# Patient Record
Sex: Female | Born: 1975 | Race: Black or African American | Hispanic: No | Marital: Married | Smoking: Former smoker
Health system: Southern US, Community
[De-identification: ages and names within clinical notes are randomized; demographics above are authoritative.]

## PROBLEM LIST (undated history)

## (undated) DIAGNOSIS — J449 Chronic obstructive pulmonary disease, unspecified: Secondary | ICD-10-CM

## (undated) DIAGNOSIS — I509 Heart failure, unspecified: Secondary | ICD-10-CM

## (undated) DIAGNOSIS — M1990 Unspecified osteoarthritis, unspecified site: Secondary | ICD-10-CM

## (undated) DIAGNOSIS — J189 Pneumonia, unspecified organism: Secondary | ICD-10-CM

## (undated) DIAGNOSIS — E119 Type 2 diabetes mellitus without complications: Secondary | ICD-10-CM

## (undated) DIAGNOSIS — I82409 Acute embolism and thrombosis of unspecified deep veins of unspecified lower extremity: Secondary | ICD-10-CM

## (undated) DIAGNOSIS — E785 Hyperlipidemia, unspecified: Secondary | ICD-10-CM

## (undated) DIAGNOSIS — G4730 Sleep apnea, unspecified: Secondary | ICD-10-CM

## (undated) HISTORY — PX: HERNIA REPAIR: SHX51

## (undated) HISTORY — PX: ACHILLES TENDON REPAIR: SUR1153

## (undated) HISTORY — DX: Type 2 diabetes mellitus without complications: E11.9

## (undated) HISTORY — PX: OTHER SURGICAL HISTORY: SHX169

## (undated) HISTORY — PX: JOINT REPLACEMENT: SHX530

---

## 1998-03-27 ENCOUNTER — Other Ambulatory Visit: Admission: RE | Admit: 1998-03-27 | Discharge: 1998-03-27 | Payer: Self-pay | Admitting: Obstetrics

## 1998-07-17 ENCOUNTER — Encounter: Admission: RE | Admit: 1998-07-17 | Discharge: 1998-07-17 | Payer: Self-pay | Admitting: Obstetrics

## 1998-07-31 ENCOUNTER — Encounter: Admission: RE | Admit: 1998-07-31 | Discharge: 1998-07-31 | Payer: Self-pay | Admitting: Obstetrics & Gynecology

## 2001-10-02 ENCOUNTER — Emergency Department (HOSPITAL_COMMUNITY): Admission: EM | Admit: 2001-10-02 | Discharge: 2001-10-02 | Payer: Self-pay

## 2009-04-16 ENCOUNTER — Encounter: Admission: RE | Admit: 2009-04-16 | Discharge: 2009-05-07 | Payer: Self-pay | Admitting: Orthopedic Surgery

## 2009-07-07 ENCOUNTER — Encounter: Admission: RE | Admit: 2009-07-07 | Discharge: 2009-08-12 | Payer: Self-pay | Admitting: Orthopedic Surgery

## 2009-07-14 ENCOUNTER — Encounter: Admission: RE | Admit: 2009-07-14 | Discharge: 2009-08-12 | Payer: Self-pay | Admitting: Orthopedic Surgery

## 2009-08-12 ENCOUNTER — Encounter: Admission: RE | Admit: 2009-08-12 | Discharge: 2009-11-10 | Payer: Self-pay | Admitting: Emergency Medicine

## 2011-03-16 ENCOUNTER — Other Ambulatory Visit (HOSPITAL_BASED_OUTPATIENT_CLINIC_OR_DEPARTMENT_OTHER): Payer: Self-pay | Admitting: Nurse Practitioner

## 2011-03-16 ENCOUNTER — Ambulatory Visit (HOSPITAL_BASED_OUTPATIENT_CLINIC_OR_DEPARTMENT_OTHER): Payer: Self-pay

## 2011-03-16 ENCOUNTER — Other Ambulatory Visit (HOSPITAL_BASED_OUTPATIENT_CLINIC_OR_DEPARTMENT_OTHER): Payer: Self-pay | Admitting: Internal Medicine

## 2011-03-16 ENCOUNTER — Ambulatory Visit (HOSPITAL_BASED_OUTPATIENT_CLINIC_OR_DEPARTMENT_OTHER)
Admission: RE | Admit: 2011-03-16 | Discharge: 2011-03-16 | Disposition: A | Discharge status: Home / Self Care | Payer: BC Managed Care – PPO | Source: Ambulatory Visit | Attending: Nurse Practitioner | Admitting: Nurse Practitioner

## 2011-03-16 DIAGNOSIS — M79606 Pain in leg, unspecified: Secondary | ICD-10-CM

## 2011-03-16 DIAGNOSIS — M79609 Pain in unspecified limb: Secondary | ICD-10-CM | POA: Insufficient documentation

## 2011-03-16 DIAGNOSIS — M79605 Pain in left leg: Secondary | ICD-10-CM

## 2012-02-24 ENCOUNTER — Other Ambulatory Visit (HOSPITAL_COMMUNITY)
Admission: RE | Admit: 2012-02-24 | Discharge: 2012-02-24 | Disposition: A | Discharge status: Home / Self Care | Payer: BC Managed Care – PPO | Source: Ambulatory Visit | Attending: Obstetrics and Gynecology | Admitting: Obstetrics and Gynecology

## 2012-02-24 ENCOUNTER — Other Ambulatory Visit: Payer: Self-pay | Admitting: Obstetrics and Gynecology

## 2012-02-24 DIAGNOSIS — Z124 Encounter for screening for malignant neoplasm of cervix: Secondary | ICD-10-CM | POA: Insufficient documentation

## 2012-09-02 IMAGING — US US EXTREM LOW VENOUS*L*
1 series · 13 of 13 positions shown · non-contrast
Comparison: None

CLINICAL DATA: LEG PAIN;;

LEFT LOWER EXTREMITY VENOUS DUPLEX ULTRASOUND
TECHNIQUE: Gray-scale sonography with compression, as well as color
and duplex ultrasound, were performed to evaluate the deep venous
system from the level of the common femoral vein through the
popliteal and proximal calf veins.

[Series 1: us extrem low venous*left* · 13 of 13 slices shown]
[im 1/13]
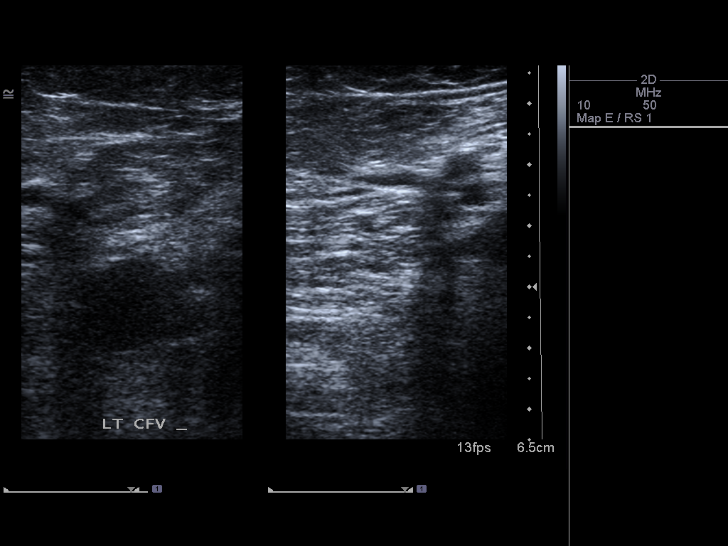
[im 2/13]
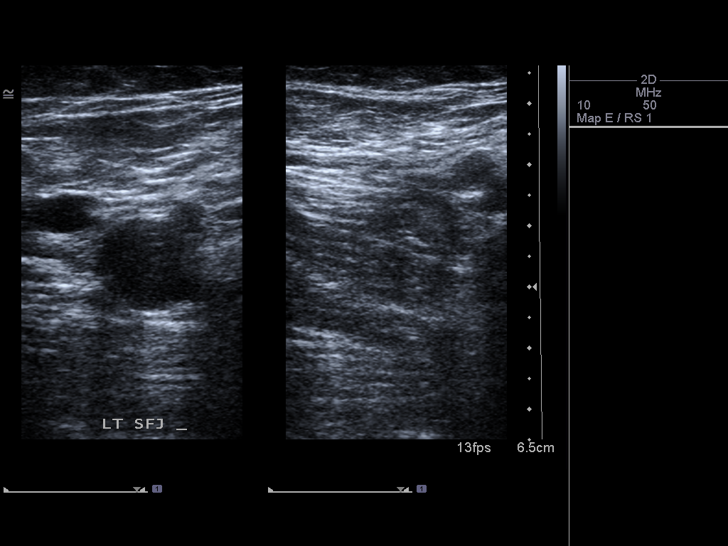
[im 3/13]
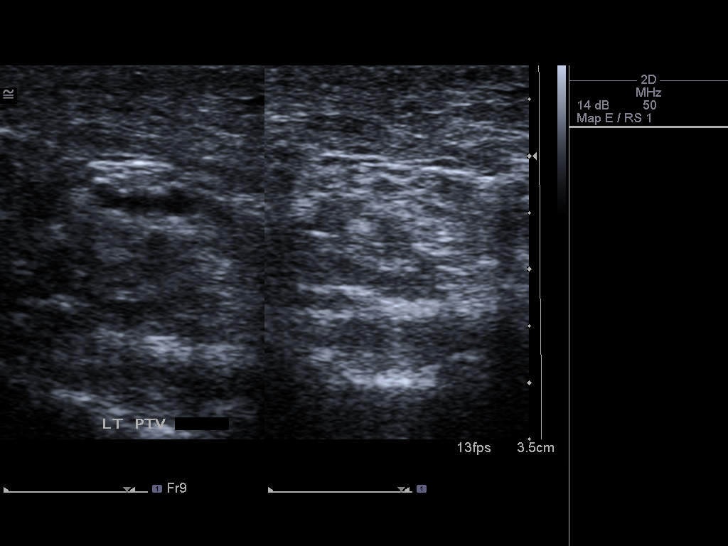
[im 4/13]
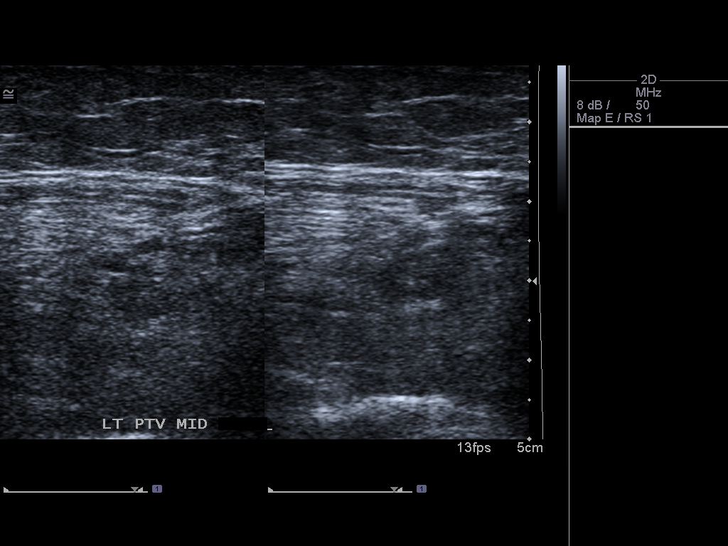
[im 5/13]
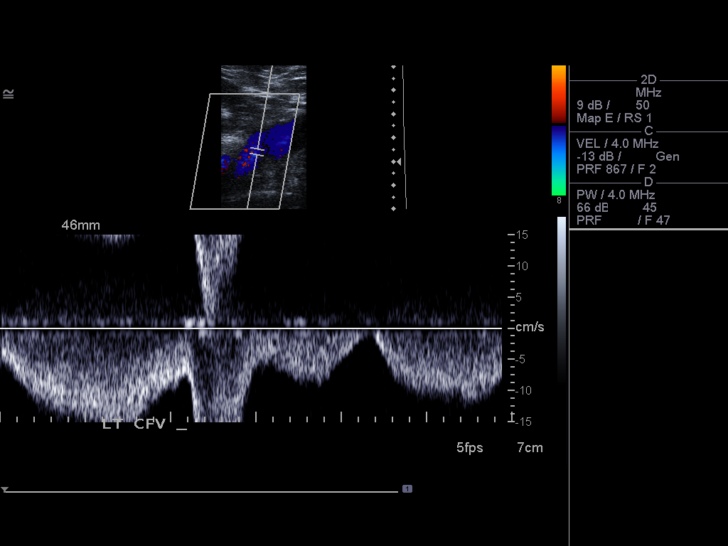
[im 6/13]
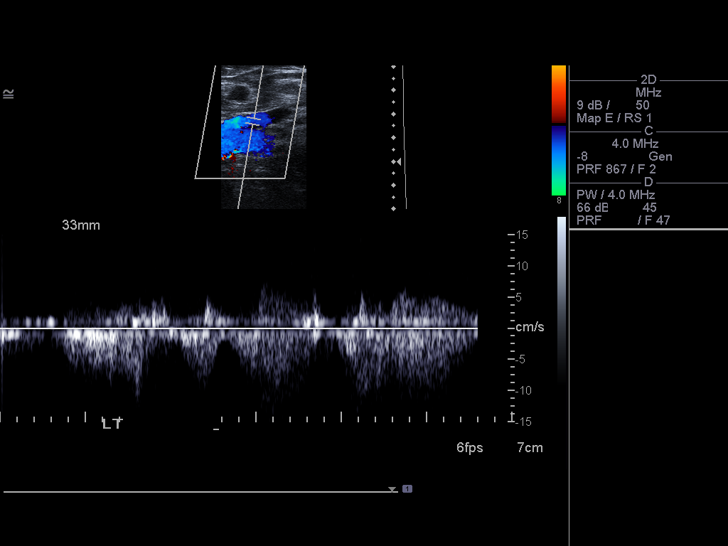
[im 7/13]
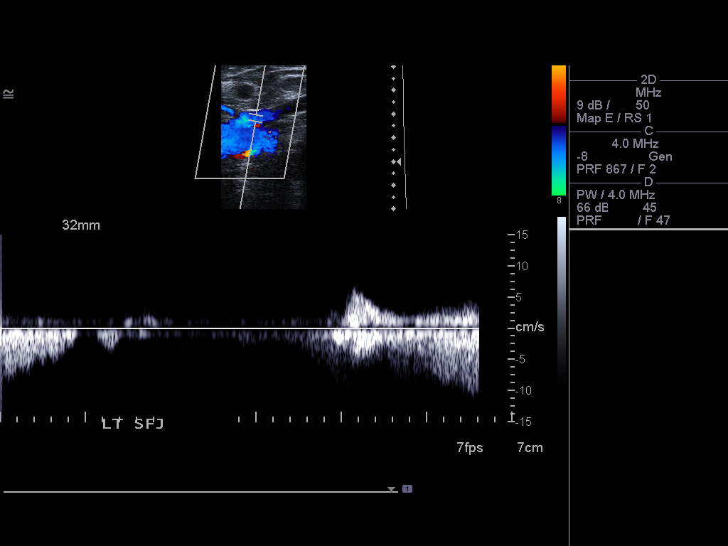
[im 8/13]
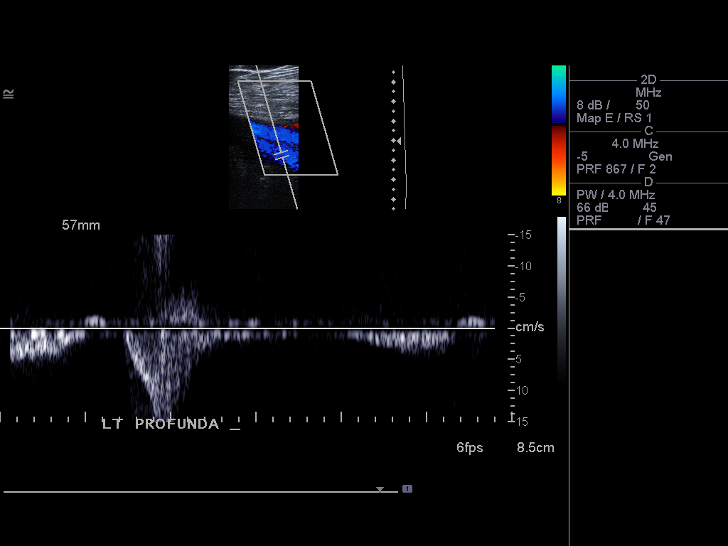
[im 9/13]
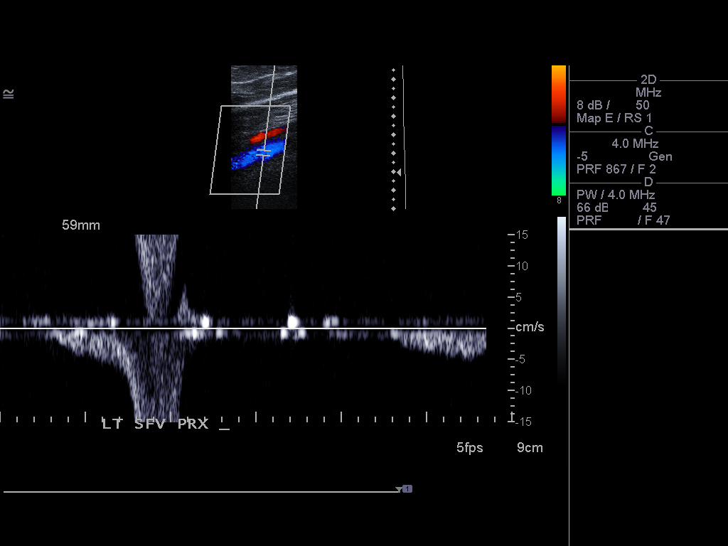
[im 10/13]
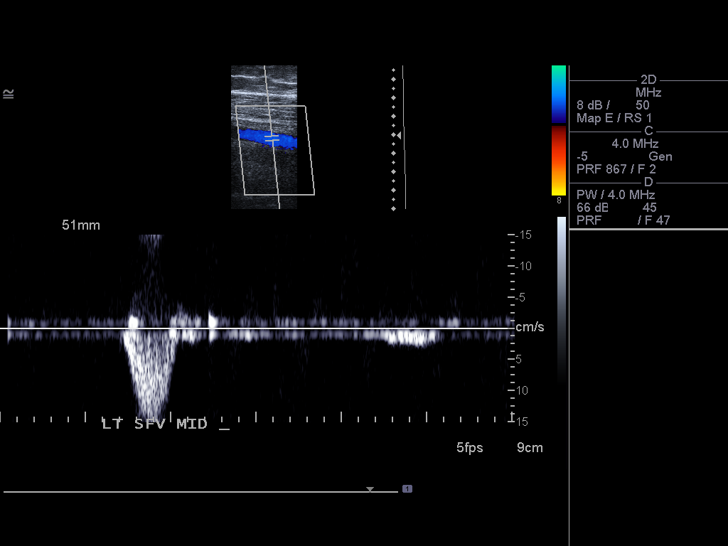
[im 11/13]
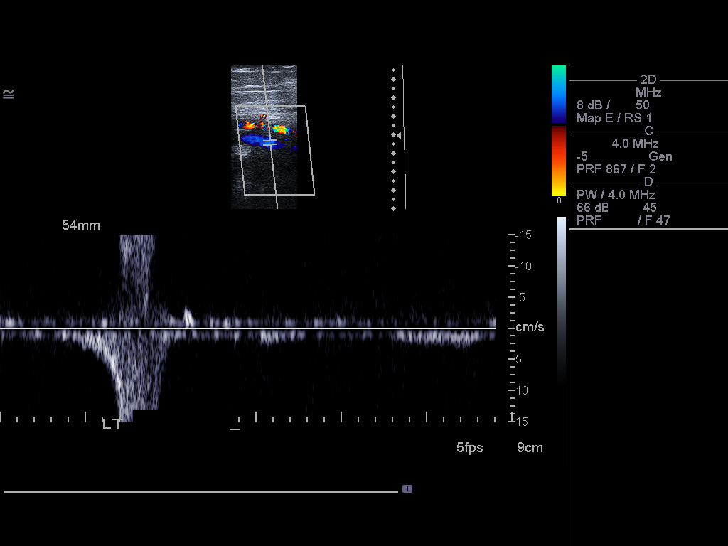
[im 12/13]
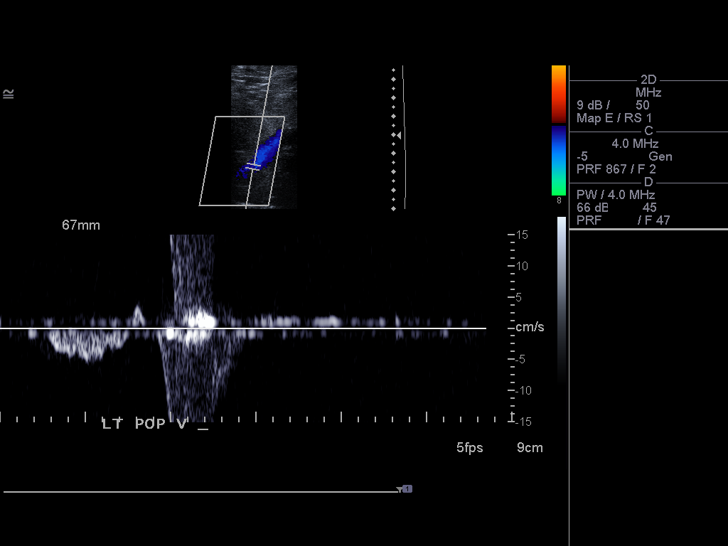
[im 13/13]
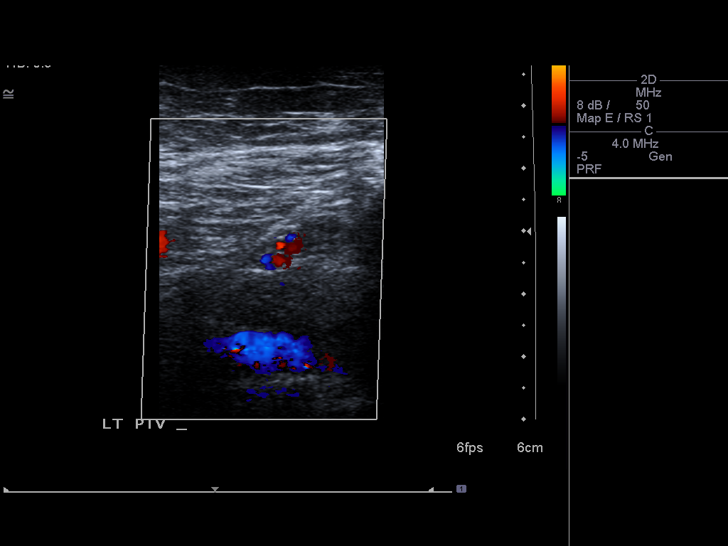

[13 of 13 positions shown; findings below may reference images not displayed]

FINDINGS: Normal compressibility and normal Doppler signal within
the common femoral, superficial femoral and popliteal veins, down
to the proximal calf veins.  No grayscale filling defects to
suggest DVT.
IMPRESSION: No evidence of left lower extremity deep vein thrombosis.

## 2012-11-01 ENCOUNTER — Encounter (HOSPITAL_BASED_OUTPATIENT_CLINIC_OR_DEPARTMENT_OTHER): Payer: Self-pay | Admitting: *Deleted

## 2012-11-01 ENCOUNTER — Emergency Department (HOSPITAL_BASED_OUTPATIENT_CLINIC_OR_DEPARTMENT_OTHER)
Admission: EM | Admit: 2012-11-01 | Discharge: 2012-11-02 | Disposition: A | Discharge status: Home / Self Care | Payer: BC Managed Care – PPO | Attending: Emergency Medicine | Admitting: Emergency Medicine

## 2012-11-01 DIAGNOSIS — F17200 Nicotine dependence, unspecified, uncomplicated: Secondary | ICD-10-CM | POA: Insufficient documentation

## 2012-11-01 DIAGNOSIS — I10 Essential (primary) hypertension: Secondary | ICD-10-CM | POA: Insufficient documentation

## 2012-11-01 DIAGNOSIS — Z79899 Other long term (current) drug therapy: Secondary | ICD-10-CM | POA: Insufficient documentation

## 2012-11-01 DIAGNOSIS — G43909 Migraine, unspecified, not intractable, without status migrainosus: Secondary | ICD-10-CM | POA: Insufficient documentation

## 2012-11-01 MED ORDER — DIPHENHYDRAMINE HCL 50 MG/ML IJ SOLN
12.5000 mg | Freq: Once | INTRAMUSCULAR | Status: AC
  Administered 2012-11-01: 12.5 mg via INTRAVENOUS
  Filled 2012-11-01: qty 1

## 2012-11-01 MED ORDER — KETOROLAC TROMETHAMINE 30 MG/ML IJ SOLN
30.0000 mg | Freq: Once | INTRAMUSCULAR | Status: AC
  Administered 2012-11-01: 30 mg via INTRAVENOUS
  Filled 2012-11-01: qty 1

## 2012-11-01 MED ORDER — PROCHLORPERAZINE EDISYLATE 5 MG/ML IJ SOLN
10.0000 mg | Freq: Four times a day (QID) | INTRAMUSCULAR | Status: DC | PRN
  Filled 2012-11-01: qty 2

## 2012-11-01 MED ORDER — HALOPERIDOL LACTATE 5 MG/ML IJ SOLN
INTRAMUSCULAR | Status: AC
  Administered 2012-11-01: 5 mg via INTRAVENOUS
  Filled 2012-11-01: qty 1

## 2012-11-01 MED ORDER — SODIUM CHLORIDE 0.9 % IV BOLUS (SEPSIS)
1000.0000 mL | Freq: Once | INTRAVENOUS | Status: AC
  Administered 2012-11-01: 1000 mL via INTRAVENOUS

## 2012-11-01 MED ORDER — HALOPERIDOL LACTATE 5 MG/ML IJ SOLN: 5.0000 mg | Freq: Once | INTRAMUSCULAR | Status: AC

## 2012-11-01 NOTE — ED Notes (Signed)
Pt c/o migraine HA x4 days not relieved by Verapamil, ibuprofen and a migraine med that starts with an "R".

## 2012-11-01 NOTE — ED Provider Notes (Signed)
History     CSN: [id number]  Arrival date & time 11/01/12  2158   First MD Initiated Contact with Patient 11/01/12 2300      Chief Complaint  Patient presents with  . Migraine    (Consider location/radiation/quality/duration/timing/severity/associated sxs/prior treatment) HPI Anna Rowe is a 37 y.o. female presents with four-day history of migraine headache. She says the headache came on gradually, feels like her prior migraine headaches is been associated with nausea, photophobia and phonophobia has not resolved with her typical therapies. She denies rapid onset of headache, or onset during exertion.  Pain is now 10 out of 10, throbbing, all over the head, nonradiating, not associated with numbness, tingling, chest pain, first breath, changes in vision, double vision, aura, stiff neck, fevers, chills, abdominal pain, diarrhea, myalgias, arthralgias or rash.   Past Medical History  Diagnosis Date  . Migraines   . Hypertension     Past Surgical History  Procedure Laterality Date  . Plantar    . Hernia repair    . Achilles tendon repair      No family history on file.  History  Substance Use Topics  . Smoking status: Current Every Day Smoker  . Smokeless tobacco: Not on file  . Alcohol Use: Yes    OB History   Grav Para Term Preterm Abortions TAB SAB Ect Mult Living                  Review of Systems At least 10pt or greater review of systems completed and are negative except where specified in the HPI.  Allergies  Review of patient's allergies indicates no known allergies.  Home Medications   Current Outpatient Rx  Name  Route  Sig  Dispense  Refill  . buPROPion (WELLBUTRIN) 100 MG tablet   Oral   Take 100 mg by mouth 2 (two) times daily.         . clonazePAM (KLONOPIN) 1 MG tablet   Oral   Take 1 mg by mouth 2 (two) times daily as needed for anxiety.         Marland Kitchen escitalopram (LEXAPRO) 20 MG tablet   Oral   Take 20 mg by mouth daily.        . verapamil (CALAN) 40 MG tablet   Oral   Take 40 mg by mouth as needed.           BP 158/99  Pulse 72  Temp(Src) 98.5 F (36.9 C) (Oral)  Resp 18  Ht 5\' 11"  (1.803 m)  Wt 275 lb (124.739 kg)  BMI 38.37 kg/m2  SpO2 100%  LMP 10/30/2012  Physical Exam  Nursing notes reviewed.  Electronic medical record reviewed. VITAL SIGNS:   Filed Vitals:   11/01/12 2212 11/02/12 0019  BP: 158/99 135/82  Pulse: 72 69  Temp: 98.5 F (36.9 C)   TempSrc: Oral   Resp: 18 16  Height: 5\' 11"  (1.803 m)   Weight: 275 lb (124.739 kg)   SpO2: 100% 96%   CONSTITUTIONAL: Awake, orientedx4, appears non-toxic HENT: Atraumatic, normocephalic, oral mucosa pink and moist, airway patent. Nares patent without drainage. External ears normal. EYES: Conjunctiva clear, EOMI, PERRLA NECK: Trachea midline, non-tender, supple CARDIOVASCULAR: Normal heart rate, Normal rhythm, No murmurs, rubs, gallops PULMONARY/CHEST: Clear to auscultation, no rhonchi, wheezes, or rales. Symmetrical breath sounds. Non-tender. ABDOMINAL: Non-distended, soft, non-tender - no rebound or guarding.  BS normal. NEUROLOGIC:Facial sensation equal to light touch bilaterally.  Good muscle bulk in the masseter muscle  and good lateral movement of the jaw.  Facial expressions equal and good strength with smile/frown and puffed cheeks.  Hearing grossly intact to finger rub test.  Uvula, tongue are midline with no deviation. Symmetrical palate elevation.  Trapezius and SCM muscles are 5/5 strength bilaterally.   Non-focal, moving all four extremities, no gross sensory or motor deficits. EXTREMITIES: No clubbing, cyanosis, or edema SKIN: Warm, Dry, No erythema, No rash  ED Course  Procedures (including critical care time)  Labs Reviewed - No data to display No results found.   1. Migraine     Medications  ketorolac (TORADOL) 30 MG/ML injection 30 mg (30 mg Intravenous Given 11/01/12 2328)  sodium chloride 0.9 % bolus 1,000 mL (0  mLs Intravenous Stopped 11/02/12 0016)  diphenhydrAMINE (BENADRYL) injection 12.5 mg (12.5 mg Intravenous Given 11/01/12 2330)  haloperidol lactate (HALDOL) injection 5 mg (5 mg Intravenous Given 11/01/12 2331)     MDM   Anna Rowe is a 37 y.o. female presenting with migraine headache x4 days. This seems a typical migraine headache per patient except it's been lasting for 4 days and has been persistent despite her therapies.  Give the patient a migraine cocktail and reevaluate. Do not think imaging is indicated at this time, considered subarachnoid hemorrhage think this is unlikely given the context and history and physical exam. Likewise patient is afebrile, nontoxic and in think she's got a CNS infection, meningitis is very low on the differential.    Patient reassessed at her migraine cocktail, patient's pain is 2/10 she feels like she can go to sleep at home.  I explained the diagnosis and have given explicit precautions to return to the ER including any other new or worsening symptoms. The patient understands and accepts the medical plan as it's been dictated and I have answered their questions. Discharge instructions concerning home care and prescriptions have been given.  The patient is STABLE and is discharged to home in good condition.         Jones Skene, MD 11/02/12 [phone]

## 2012-11-01 NOTE — ED Notes (Signed)
MD at bedside. 

## 2012-11-02 MED ORDER — DIPHENHYDRAMINE HCL 25 MG PO CAPS: 25.0000 mg | ORAL_CAPSULE | Freq: Four times a day (QID) | ORAL | Status: DC | PRN

## 2012-11-02 MED ORDER — PROCHLORPERAZINE MALEATE 10 MG PO TABS: 10.0000 mg | ORAL_TABLET | Freq: Two times a day (BID) | ORAL | Status: DC | PRN

## 2012-12-13 ENCOUNTER — Encounter (HOSPITAL_BASED_OUTPATIENT_CLINIC_OR_DEPARTMENT_OTHER): Payer: Self-pay | Admitting: Emergency Medicine

## 2012-12-13 ENCOUNTER — Emergency Department (HOSPITAL_BASED_OUTPATIENT_CLINIC_OR_DEPARTMENT_OTHER)
Admission: EM | Admit: 2012-12-13 | Discharge: 2012-12-13 | Disposition: A | Discharge status: Home / Self Care | Payer: BC Managed Care – PPO | Attending: Emergency Medicine | Admitting: Emergency Medicine

## 2012-12-13 DIAGNOSIS — G43909 Migraine, unspecified, not intractable, without status migrainosus: Secondary | ICD-10-CM | POA: Insufficient documentation

## 2012-12-13 DIAGNOSIS — Z79899 Other long term (current) drug therapy: Secondary | ICD-10-CM | POA: Insufficient documentation

## 2012-12-13 DIAGNOSIS — B9689 Other specified bacterial agents as the cause of diseases classified elsewhere: Secondary | ICD-10-CM

## 2012-12-13 DIAGNOSIS — Z3202 Encounter for pregnancy test, result negative: Secondary | ICD-10-CM | POA: Insufficient documentation

## 2012-12-13 DIAGNOSIS — N760 Acute vaginitis: Secondary | ICD-10-CM | POA: Insufficient documentation

## 2012-12-13 DIAGNOSIS — F17200 Nicotine dependence, unspecified, uncomplicated: Secondary | ICD-10-CM | POA: Insufficient documentation

## 2012-12-13 DIAGNOSIS — N72 Inflammatory disease of cervix uteri: Secondary | ICD-10-CM

## 2012-12-13 LAB — URINALYSIS, ROUTINE W REFLEX MICROSCOPIC
Bilirubin Urine: NEGATIVE
Glucose, UA: NEGATIVE mg/dL
Ketones, ur: NEGATIVE mg/dL
Nitrite: NEGATIVE
Protein, ur: NEGATIVE mg/dL
Specific Gravity, Urine: 1.027 (ref 1.005–1.030)
Urobilinogen, UA: 1 mg/dL (ref 0.0–1.0)
pH: 6 (ref 5.0–8.0)

## 2012-12-13 LAB — WET PREP, GENITAL
Trich, Wet Prep: NONE SEEN
Yeast Wet Prep HPF POC: NONE SEEN

## 2012-12-13 LAB — URINE MICROSCOPIC-ADD ON

## 2012-12-13 LAB — PREGNANCY, URINE: Preg Test, Ur: NEGATIVE

## 2012-12-13 MED ORDER — METRONIDAZOLE 500 MG PO TABS: 500.0000 mg | ORAL_TABLET | Freq: Two times a day (BID) | ORAL | Status: DC

## 2012-12-13 MED ORDER — AZITHROMYCIN 250 MG PO TABS
1000.0000 mg | ORAL_TABLET | Freq: Once | ORAL | Status: AC
  Administered 2012-12-13: 1000 mg via ORAL
  Filled 2012-12-13: qty 4

## 2012-12-13 MED ORDER — CEFTRIAXONE SODIUM 250 MG IJ SOLR
250.0000 mg | Freq: Once | INTRAMUSCULAR | Status: AC
  Administered 2012-12-13: 250 mg via INTRAMUSCULAR
  Filled 2012-12-13: qty 250

## 2012-12-13 NOTE — ED Notes (Signed)
Pt c/o lower abd pain x 3 weeks. Denies n/v/d.

## 2012-12-13 NOTE — ED Provider Notes (Signed)
History     CSN: [id number]  Arrival date & time 12/13/12  0226   First MD Initiated Contact with Patient 12/13/12 0240      Chief Complaint  Patient presents with  . Abdominal Pain    (Consider location/radiation/quality/duration/timing/severity/associated sxs/prior treatment) HPI This is a 37 year old female with a three-week history of abdominal discomfort. The discomfort is located in the suprapubic region. The discomfort is vaguely described but the closest she can come as a pressure. The discomfort is mild to moderate. It gets worse before she urinates and improves after she urinates. She denies frank dysuria. She has not seen blood in her urine. She does not get the sense that she is urinating more frequently than usual. She denies vaginal bleeding or discharge. She denies fever, chills, chest pain, shortness of breath, nausea, vomiting or diarrhea. The discomfort is not worse with movement or palpation the  Past Medical History  Diagnosis Date  . Migraines     Past Surgical History  Procedure Laterality Date  . Plantar    . Hernia repair    . Achilles tendon repair      No family history on file.  History  Substance Use Topics  . Smoking status: Current Every Day Smoker  . Smokeless tobacco: Not on file  . Alcohol Use: Yes    OB History   Grav Para Term Preterm Abortions TAB SAB Ect Mult Living                  Review of Systems  All other systems reviewed and are negative.    Allergies  Tramadol  Home Medications   Current Outpatient Rx  Name  Route  Sig  Dispense  Refill  . diphenhydrAMINE (BENADRYL) 25 mg capsule   Oral   Take 1 capsule (25 mg total) by mouth every 6 (six) hours as needed for itching (take with comapzine for headache).   10 capsule   0   . HYDROcodone-acetaminophen (VICODIN) 5-500 MG per tablet   Oral   Take 1 tablet by mouth every 6 (six) hours as needed for pain.         Marland Kitchen prochlorperazine (COMPAZINE) 10 MG tablet  Oral   Take 1 tablet (10 mg total) by mouth 2 (two) times daily as needed (for headache, nausea; take with 25 mg benadryl).   10 tablet   0   . buPROPion (WELLBUTRIN) 100 MG tablet   Oral   Take 100 mg by mouth 2 (two) times daily.         . clonazePAM (KLONOPIN) 1 MG tablet   Oral   Take 1 mg by mouth 2 (two) times daily as needed for anxiety.         Marland Kitchen escitalopram (LEXAPRO) 20 MG tablet   Oral   Take 20 mg by mouth daily.         . verapamil (CALAN) 40 MG tablet   Oral   Take 40 mg by mouth as needed.           BP 147/99  Pulse 78  Temp(Src) 98.2 F (36.8 C) (Oral)  Resp 18  Ht 6' (1.829 m)  Wt 260 lb (117.935 kg)  BMI 35.25 kg/m2  SpO2 99%  LMP 11/26/2012  Physical Exam General: Well-developed, well-nourished female in no acute distress; appearance consistent with age of record HENT: normocephalic, atraumatic Eyes: pupils equal round and reactive to light; extraocular muscles intact Neck: supple Heart: regular rate and rhythm Lungs:  clear to auscultation bilaterally Abdomen: soft; nondistended; nontender; no masses or hepatosplenomegaly; bowel sounds present GU: No CVA tenderness; normal external genitalia; yellowish vaginal discharge; no vaginal bleeding; no cervical motion tenderness; slight adnexal tenderness left greater than right Extremities: No deformity; full range of motion Neurologic: Awake, alert and oriented; motor function intact in all extremities and symmetric; no facial droop Skin: Warm and dry Psychiatric: Normal mood and affect    ED Course  Procedures (including critical care time)     MDM   Nursing notes and vitals signs, including pulse oximetry, reviewed.  Summary of this visit's results, reviewed by myself:  Labs:  Results for orders placed during the hospital encounter of 12/13/12 (from the past 24 hour(s))  PREGNANCY, URINE     Status: None   Collection Time    12/13/12  2:30 AM      Result Value Range   Preg  Test, Ur NEGATIVE  NEGATIVE  URINALYSIS, ROUTINE W REFLEX MICROSCOPIC     Status: Abnormal   Collection Time    12/13/12  2:30 AM      Result Value Range   Color, Urine YELLOW  YELLOW   APPearance CLOUDY (*) CLEAR   Specific Gravity, Urine 1.027  1.005 - 1.030   pH 6.0  5.0 - 8.0   Glucose, UA NEGATIVE  NEGATIVE mg/dL   Hgb urine dipstick TRACE (*) NEGATIVE   Bilirubin Urine NEGATIVE  NEGATIVE   Ketones, ur NEGATIVE  NEGATIVE mg/dL   Protein, ur NEGATIVE  NEGATIVE mg/dL   Urobilinogen, UA 1.0  0.0 - 1.0 mg/dL   Nitrite NEGATIVE  NEGATIVE   Leukocytes, UA SMALL (*) NEGATIVE  URINE MICROSCOPIC-ADD ON     Status: Abnormal   Collection Time    12/13/12  2:30 AM      Result Value Range   Squamous Epithelial / LPF MANY (*) RARE   WBC, UA 3-6  <3 WBC/hpf   RBC / HPF 0-2  <3 RBC/hpf   Bacteria, UA MANY (*) RARE  WET PREP, GENITAL     Status: Abnormal   Collection Time    12/13/12  2:57 AM      Result Value Range   Yeast Wet Prep HPF POC NONE SEEN  NONE SEEN   Trich, Wet Prep NONE SEEN  NONE SEEN   Clue Cells Wet Prep HPF POC MODERATE (*) NONE SEEN   WBC, Wet Prep HPF POC MANY (*) NONE SEEN    Will treat for cervicitis and bacterial vaginosis.      Hanley Seamen, MD 12/13/12 [phone]

## 2012-12-13 NOTE — ED Notes (Signed)
Pt states she began having pressure sensation with urination yesterday.

## 2012-12-14 LAB — URINE CULTURE

## 2012-12-14 LAB — GC/CHLAMYDIA PROBE AMP
CT Probe RNA: NEGATIVE
GC Probe RNA: NEGATIVE

## 2013-08-15 HISTORY — PX: KNEE ARTHROSCOPY: SUR90

## 2015-04-10 ENCOUNTER — Encounter (HOSPITAL_BASED_OUTPATIENT_CLINIC_OR_DEPARTMENT_OTHER): Payer: Self-pay | Admitting: *Deleted

## 2015-04-10 ENCOUNTER — Emergency Department (HOSPITAL_BASED_OUTPATIENT_CLINIC_OR_DEPARTMENT_OTHER)
Admission: EM | Admit: 2015-04-10 | Discharge: 2015-04-10 | Discharge status: Against Medical Advice | Payer: 59 | Attending: Emergency Medicine | Admitting: Emergency Medicine

## 2015-04-10 DIAGNOSIS — N898 Other specified noninflammatory disorders of vagina: Secondary | ICD-10-CM | POA: Insufficient documentation

## 2015-04-10 DIAGNOSIS — Z720 Tobacco use: Secondary | ICD-10-CM | POA: Diagnosis not present

## 2015-04-10 NOTE — ED Notes (Signed)
Vaginal discharge x 2 weeks. She was seen at [facility] and treated for a UTI with Cipro. Now her discharge has changed from white to yellow.

## 2015-04-10 NOTE — ED Notes (Addendum)
Called in w/a x 3 with no answer. She told registration she was going outside to wait. She is not found outside.

## 2016-02-26 ENCOUNTER — Other Ambulatory Visit: Payer: Self-pay | Admitting: Orthopedic Surgery

## 2016-03-24 ENCOUNTER — Encounter (HOSPITAL_COMMUNITY)
Admission: RE | Admit: 2016-03-24 | Discharge: 2016-03-24 | Disposition: A | Discharge status: Home / Self Care | Payer: Worker's Compensation | Source: Ambulatory Visit | Attending: Orthopedic Surgery | Admitting: Orthopedic Surgery

## 2016-03-24 ENCOUNTER — Ambulatory Visit (HOSPITAL_COMMUNITY)
Admission: RE | Admit: 2016-03-24 | Discharge: 2016-03-24 | Disposition: A | Discharge status: Home / Self Care | Payer: Self-pay | Source: Ambulatory Visit | Attending: Orthopedic Surgery | Admitting: Orthopedic Surgery

## 2016-03-24 ENCOUNTER — Encounter (HOSPITAL_COMMUNITY): Payer: Self-pay

## 2016-03-24 DIAGNOSIS — Z01812 Encounter for preprocedural laboratory examination: Secondary | ICD-10-CM | POA: Diagnosis not present

## 2016-03-24 DIAGNOSIS — Z01818 Encounter for other preprocedural examination: Secondary | ICD-10-CM | POA: Insufficient documentation

## 2016-03-24 DIAGNOSIS — M1711 Unilateral primary osteoarthritis, right knee: Secondary | ICD-10-CM | POA: Insufficient documentation

## 2016-03-24 DIAGNOSIS — Z0183 Encounter for blood typing: Secondary | ICD-10-CM | POA: Diagnosis not present

## 2016-03-24 LAB — APTT: aPTT: 33 seconds (ref 24–36)

## 2016-03-24 LAB — CBC WITH DIFFERENTIAL/PLATELET
Basophils Absolute: 0.1 10*3/uL (ref 0.0–0.1)
Basophils Relative: 1 %
Eosinophils Absolute: 0.2 10*3/uL (ref 0.0–0.7)
Eosinophils Relative: 2 %
HCT: 42.4 % (ref 36.0–46.0)
Hemoglobin: 13.9 g/dL (ref 12.0–15.0)
Lymphocytes Relative: 51 %
Lymphs Abs: 5.3 10*3/uL — ABNORMAL HIGH (ref 0.7–4.0)
MCH: 23 pg — ABNORMAL LOW (ref 26.0–34.0)
MCHC: 32.8 g/dL (ref 30.0–36.0)
MCV: 70.1 fL — ABNORMAL LOW (ref 78.0–100.0)
Monocytes Absolute: 0.6 10*3/uL (ref 0.1–1.0)
Monocytes Relative: 5 %
Neutro Abs: 4.3 10*3/uL (ref 1.7–7.7)
Neutrophils Relative %: 41 %
Platelets: 432 10*3/uL — ABNORMAL HIGH (ref 150–400)
RBC: 6.05 MIL/uL — ABNORMAL HIGH (ref 3.87–5.11)
RDW: 15.7 % — ABNORMAL HIGH (ref 11.5–15.5)
WBC: 10.4 10*3/uL (ref 4.0–10.5)

## 2016-03-24 LAB — COMPREHENSIVE METABOLIC PANEL
ALT: 12 U/L — ABNORMAL LOW (ref 14–54)
AST: 16 U/L (ref 15–41)
Albumin: 3.6 g/dL (ref 3.5–5.0)
Alkaline Phosphatase: 60 U/L (ref 38–126)
Anion gap: 9 (ref 5–15)
BUN: <5 mg/dL — ABNORMAL LOW (ref 6–20)
CO2: 21 mmol/L — ABNORMAL LOW (ref 22–32)
Calcium: 9.2 mg/dL (ref 8.9–10.3)
Chloride: 106 mmol/L (ref 101–111)
Creatinine, Ser: 0.89 mg/dL (ref 0.44–1.00)
GFR calc Af Amer: >60 mL/min (ref >60)
GFR calc non Af Amer: >60 mL/min (ref >60)
Glucose, Bld: 112 mg/dL — ABNORMAL HIGH (ref 65–99)
Potassium: 3 mmol/L — ABNORMAL LOW (ref 3.5–5.1)
Sodium: 136 mmol/L (ref 135–145)
Total Bilirubin: 0.3 mg/dL (ref 0.3–1.2)
Total Protein: 7.1 g/dL (ref 6.5–8.1)

## 2016-03-24 LAB — URINALYSIS, ROUTINE W REFLEX MICROSCOPIC
Bilirubin Urine: NEGATIVE
Glucose, UA: NEGATIVE mg/dL
Ketones, ur: NEGATIVE mg/dL
Nitrite: NEGATIVE
Protein, ur: NEGATIVE mg/dL
Specific Gravity, Urine: 1.013 (ref 1.005–1.030)
pH: 6.5 (ref 5.0–8.0)

## 2016-03-24 LAB — PROTIME-INR
INR: 0.99
Prothrombin Time: 13 seconds (ref 11.4–15.2)

## 2016-03-24 LAB — ABO/RH: ABO/RH(D): O POS

## 2016-03-24 LAB — URINE MICROSCOPIC-ADD ON

## 2016-03-24 LAB — TYPE AND SCREEN
ABO/RH(D): O POS
Antibody Screen: NEGATIVE

## 2016-03-24 LAB — SURGICAL PCR SCREEN
MRSA, PCR: NEGATIVE
Staphylococcus aureus: NEGATIVE

## 2016-03-24 LAB — HCG, SERUM, QUALITATIVE: Preg, Serum: NEGATIVE

## 2016-03-24 NOTE — Progress Notes (Signed)
PCP: [facility] in [facility] on [address] Aletha Halim request ekg/notes.  No cardiologist, denies heart problems

## 2016-03-24 NOTE — Pre-Procedure Instructions (Signed)
    Katelynd Quick-Hope  03/24/2016      [id number] [place], [place] [id number]: [phone]: [phone]   Your procedure is scheduled on Aug. 21  [facility]  Call this number if you have problems the morning of surgery:  [phone]  Remember:  Do not eat food or drink liquids after midnight on aug. 20  Take these medicines the morning of surgery with A SIP OF WATER : clonazepam (klonopin), flexeril if needed,lexapro (escitalopram), oxycontin              Stop NSAIDs: advil, motrin, ibuprofen, aleve, BC Powders, Goody's 1 week prior to surgery.   Do not wear jewelry, make-up or nail polish.  Do not wear lotions, powders, or perfumes.  You may  Not wear deoderant.  Do not shave 48 hours prior to surgery.  Men may shave face and neck.  Do not bring valuables to the hospital.  [facility] or [facility]ables.  Contacts, dentures or bridgework may not be worn into surgery.  Leave your suitcase in the car.  After surgery it may be brought to your room.  For patients admitted to the hospital, discharge time will be determined by your treatment team.  Patients discharged the day of surgery will not be allowed to drive home.    Special instructions: review preparing for surgery  Please read over the following fact sheets that you were given. Coughing and Deep Breathing and MRSA Information

## 2016-04-03 MED ORDER — CEFAZOLIN SODIUM-DEXTROSE 2-4 GM/100ML-% IV SOLN
2.0000 g | INTRAVENOUS | Status: AC
  Administered 2016-04-04: 2 g via INTRAVENOUS
  Filled 2016-04-03: qty 100

## 2016-04-03 NOTE — H&P (Signed)
TOTAL KNEE ADMISSION H&P  Patient is being admitted for right total knee arthroplasty.  Subjective:  Chief Complaint:right knee pain.  HPI: Anna Rowe, 40 y.o. female, has a history of pain and functional disability in the right knee due to trauma and arthritis and has failed non-surgical conservative treatments for greater than 12 weeks to includeNSAID's and/or analgesics, corticosteriod injections, viscosupplementation injections and activity modification.  Onset of symptoms was gradual, starting 2 years ago with gradually worsening course since that time. The patient noted prior procedures on the knee to include  arthroscopy and menisectomy on the right knee(s).  Patient currently rates pain in the right knee(s) at 9 out of 10 with activity. Patient has night pain, worsening of pain with activity and weight bearing, pain that interferes with activities of daily living and joint swelling.  Patient has evidence of subchondral sclerosis, joint subluxation and joint space narrowing by imaging studies. This patient has had ffailure of all reasonable conservative care. There is no active infection.  There are no active problems to display for this patient.  Past Medical History:  Diagnosis Date  . Migraines     Past Surgical History:  Procedure Laterality Date  . ACHILLES TENDON REPAIR    . HERNIA REPAIR     x2  . KNEE ARTHROSCOPY Right 2015  . plantar      No prescriptions prior to admission.   Allergies  Allergen Reactions  . Tramadol Anaphylaxis  . Bee Venom Hives    Social History  Substance Use Topics  . Smoking status: Current Every Day Smoker    Packs/day: 0.50    Years: 17.00    Types: Cigarettes  . Smokeless tobacco: Never Used  . Alcohol use No    No family history on file.   Review of Systems  Genitourinary: Positive for flank pain.  ROS: I have reviewed the patient's review of systems thoroughly and there are no positive responses as relates to the  HPI.  Objective:  Physical Exam  Vital signs in last 24 hours:   Well-developed well-nourished patient in no acute distress. Alert and oriented x3 HEENT:within normal limits Cardiac: Regular rate and rhythm Pulmonary: Lungs clear to auscultation Abdomen: Soft and nontender.  Normal active bowel sounds  Musculoskeletal: right knee: Limited range of motion.  Pain on range of motion.  Trace effusion.  No instability.) Labs: Recent Results (from the past 2160 hour(s))  APTT     Status: None   Collection Time: 03/24/16  2:09 PM  Result Value Ref Range   aPTT 33 24 - 36 seconds  CBC WITH DIFFERENTIAL     Status: Abnormal   Collection Time: 03/24/16  2:09 PM  Result Value Ref Range   WBC 10.4 4.0 - 10.5 K/uL   RBC 6.05 (H) 3.87 - 5.11 MIL/uL   Hemoglobin 13.9 12.0 - 15.0 g/dL   HCT 42.4 36.0 - 46.0 %   MCV 70.1 (L) 78.0 - 100.0 fL   MCH 23.0 (L) 26.0 - 34.0 pg   MCHC 32.8 30.0 - 36.0 g/dL   RDW 15.7 (H) 11.5 - 15.5 %   Platelets 432 (H) 150 - 400 K/uL   Neutrophils Relative % 41 %   Neutro Abs 4.3 1.7 - 7.7 K/uL   Lymphocytes Relative 51 %   Lymphs Abs 5.3 (H) 0.7 - 4.0 K/uL   Monocytes Relative 5 %   Monocytes Absolute 0.6 0.1 - 1.0 K/uL   Eosinophils Relative 2 %   Eosinophils Absolute  0.2 0.0 - 0.7 K/uL   Basophils Relative 1 %   Basophils Absolute 0.1 0.0 - 0.1 K/uL  Comprehensive metabolic panel     Status: Abnormal   Collection Time: 03/24/16  2:09 PM  Result Value Ref Range   Sodium 136 135 - 145 mmol/L   Potassium 3.0 (L) 3.5 - 5.1 mmol/L   Chloride 106 101 - 111 mmol/L   CO2 21 (L) 22 - 32 mmol/L   Glucose, Bld 112 (H) 65 - 99 mg/dL   BUN <5 (L) 6 - 20 mg/dL   Creatinine, Ser 0.89 0.44 - 1.00 mg/dL   Calcium 9.2 8.9 - 10.3 mg/dL   Total Protein 7.1 6.5 - 8.1 g/dL   Albumin 3.6 3.5 - 5.0 g/dL   AST 16 15 - 41 U/L   ALT 12 (L) 14 - 54 U/L   Alkaline Phosphatase 60 38 - 126 U/L   Total Bilirubin 0.3 0.3 - 1.2 mg/dL   GFR calc non Af Amer >60 >60 mL/min    GFR calc Af Amer >60 >60 mL/min    Comment: (NOTE) The eGFR has been calculated using the CKD EPI equation. This calculation has not been validated in all clinical situations. eGFR's persistently <60 mL/min signify possible Chronic Kidney Disease.    Anion gap 9 5 - 15  Protime-INR     Status: None   Collection Time: 03/24/16  2:09 PM  Result Value Ref Range   Prothrombin Time 13.0 11.4 - 15.2 seconds   INR 0.99   hCG, serum, qualitative     Status: None   Collection Time: 03/24/16  2:09 PM  Result Value Ref Range   Preg, Serum NEGATIVE NEGATIVE    Comment:        THE SENSITIVITY OF THIS METHODOLOGY IS >10 mIU/mL.   Type and screen Order type and screen if day of surgery is less than 15 days from draw of preadmission visit or order morning of surgery if day of surgery is greater than 6 days from preadmission visit.     Status: None   Collection Time: 03/24/16  2:15 PM  Result Value Ref Range   ABO/RH(D) O POS    Antibody Screen NEG    Sample Expiration 04/07/2016    Extend sample reason NO TRANSFUSIONS OR PREGNANCY IN THE PAST 3 MONTHS   ABO/Rh     Status: None   Collection Time: 03/24/16  2:15 PM  Result Value Ref Range   ABO/RH(D) O POS   Surgical pcr screen     Status: None   Collection Time: 03/24/16  2:18 PM  Result Value Ref Range   MRSA, PCR NEGATIVE NEGATIVE   Staphylococcus aureus NEGATIVE NEGATIVE    Comment:        The Xpert SA Assay (FDA approved for NASAL specimens in patients over 77 years of age), is one component of a comprehensive surveillance program.  Test performance has been validated by [facility] for patients greater than or equal to 53 year old. It is not intended to diagnose infection nor to guide or monitor treatment.   Urinalysis, Routine w reflex microscopic (not at [facility])     Status: Abnormal   Collection Time: 03/24/16  2:19 PM  Result Value Ref Range   Color, Urine YELLOW YELLOW   APPearance CLOUDY (A) CLEAR   Specific Gravity,  Urine 1.013 1.005 - 1.030   pH 6.5 5.0 - 8.0   Glucose, UA NEGATIVE NEGATIVE mg/dL   Hgb  urine dipstick SMALL (A) NEGATIVE   Bilirubin Urine NEGATIVE NEGATIVE   Ketones, ur NEGATIVE NEGATIVE mg/dL   Protein, ur NEGATIVE NEGATIVE mg/dL   Nitrite NEGATIVE NEGATIVE   Leukocytes, UA SMALL (A) NEGATIVE  Urine microscopic-add on     Status: Abnormal   Collection Time: 03/24/16  2:19 PM  Result Value Ref Range   Squamous Epithelial / LPF 6-30 (A) NONE SEEN   WBC, UA 6-30 0 - 5 WBC/hpf   RBC / HPF 0-5 0 - 5 RBC/hpf   Bacteria, UA MANY (A) NONE SEEN    Estimated body mass index is 35.36 kg/m as calculated from the following:   Height as of 03/24/16: 6' (1.829 m).   Weight as of 03/24/16: 118.3 kg (260 lb 11.2 oz).   Imaging Review Plain radiographs demonstrate severe degenerative joint disease of the right knee(s). The overall alignment ismild varus. The bone quality appears to be fair for age and reported activity level.  Assessment/Plan:  End stage arthritis, right knee   The patient history, physical examination, clinical judgment of the provider and imaging studies are consistent with end stage degenerative joint disease of the right knee(s) and total knee arthroplasty is deemed medically necessary. The treatment options including medical management, injection therapy arthroscopy and arthroplasty were discussed at length. The risks and benefits of total knee arthroplasty were presented and reviewed. The risks due to aseptic loosening, infection, stiffness, patella tracking problems, thromboembolic complications and other imponderables were discussed. The patient acknowledged the explanation, agreed to proceed with the plan and consent was signed. Patient is being admitted for inpatient treatment for surgery, pain control, PT, OT, prophylactic antibiotics, VTE prophylaxis, progressive ambulation and ADL's and discharge planning. The patient is planning to be discharged home with home health  services

## 2016-04-04 ENCOUNTER — Inpatient Hospital Stay (HOSPITAL_COMMUNITY): Payer: Worker's Compensation | Admitting: Anesthesiology

## 2016-04-04 ENCOUNTER — Encounter (HOSPITAL_COMMUNITY): Payer: Self-pay | Admitting: *Deleted

## 2016-04-04 ENCOUNTER — Inpatient Hospital Stay (HOSPITAL_COMMUNITY)
Admission: RE | Admit: 2016-04-04 | Discharge: 2016-04-06 | DRG: 470 | Disposition: A | Discharge status: Home Health | Payer: Worker's Compensation | Source: Ambulatory Visit | Attending: Orthopedic Surgery | Admitting: Orthopedic Surgery

## 2016-04-04 ENCOUNTER — Encounter (HOSPITAL_COMMUNITY)
Admission: RE | Disposition: A | Discharge status: Home Health | Payer: Self-pay | Source: Ambulatory Visit | Attending: Orthopedic Surgery

## 2016-04-04 DIAGNOSIS — M1711 Unilateral primary osteoarthritis, right knee: Secondary | ICD-10-CM | POA: Diagnosis present

## 2016-04-04 DIAGNOSIS — F17210 Nicotine dependence, cigarettes, uncomplicated: Secondary | ICD-10-CM | POA: Diagnosis present

## 2016-04-04 DIAGNOSIS — Z6835 Body mass index (BMI) 35.0-35.9, adult: Secondary | ICD-10-CM | POA: Diagnosis not present

## 2016-04-04 DIAGNOSIS — M25561 Pain in right knee: Secondary | ICD-10-CM | POA: Diagnosis present

## 2016-04-04 HISTORY — PX: TOTAL KNEE ARTHROPLASTY: SHX125

## 2016-04-04 SURGERY — ARTHROPLASTY, KNEE, TOTAL
Anesthesia: Regional | Site: Knee | Laterality: Right

## 2016-04-04 MED ORDER — ONDANSETRON HCL 4 MG/2ML IJ SOLN
INTRAMUSCULAR | Status: AC
  Filled 2016-04-04: qty 2

## 2016-04-04 MED ORDER — ROCURONIUM BROMIDE 10 MG/ML (PF) SYRINGE
PREFILLED_SYRINGE | INTRAVENOUS | Status: AC
  Filled 2016-04-04: qty 10

## 2016-04-04 MED ORDER — ONDANSETRON HCL 4 MG/2ML IJ SOLN
4.0000 mg | Freq: Four times a day (QID) | INTRAMUSCULAR | Status: DC | PRN
Start: 2016-04-04 — End: 2016-04-06

## 2016-04-04 MED ORDER — ONDANSETRON HCL 4 MG PO TABS: 4.0000 mg | ORAL_TABLET | Freq: Four times a day (QID) | ORAL | Status: DC | PRN

## 2016-04-04 MED ORDER — TRANEXAMIC ACID 1000 MG/10ML IV SOLN
1000.0000 mg | Freq: Once | INTRAVENOUS | Status: AC
  Administered 2016-04-04: 1000 mg via INTRAVENOUS
  Filled 2016-04-04: qty 10

## 2016-04-04 MED ORDER — PROPOFOL 10 MG/ML IV BOLUS
INTRAVENOUS | Status: DC | PRN
  Administered 2016-04-04: 200 mg via INTRAVENOUS
  Administered 2016-04-04: 100 mg via INTRAVENOUS

## 2016-04-04 MED ORDER — HYDROMORPHONE HCL 1 MG/ML IJ SOLN
INTRAMUSCULAR | Status: AC
  Filled 2016-04-04: qty 1

## 2016-04-04 MED ORDER — FENTANYL CITRATE (PF) 100 MCG/2ML IJ SOLN
INTRAMUSCULAR | Status: AC
  Filled 2016-04-04: qty 2

## 2016-04-04 MED ORDER — METHOCARBAMOL 750 MG PO TABS: 750.0000 mg | ORAL_TABLET | Freq: Three times a day (TID) | ORAL | 0 refills | Status: DC | PRN

## 2016-04-04 MED ORDER — DIPHENHYDRAMINE HCL 12.5 MG/5ML PO ELIX: 12.5000 mg | ORAL_SOLUTION | ORAL | Status: DC | PRN

## 2016-04-04 MED ORDER — FENTANYL CITRATE (PF) 100 MCG/2ML IJ SOLN
INTRAMUSCULAR | Status: DC | PRN
  Administered 2016-04-04 (×2): 100 ug via INTRAVENOUS
  Administered 2016-04-04: 200 ug via INTRAVENOUS
  Administered 2016-04-04 (×6): 100 ug via INTRAVENOUS
  Administered 2016-04-04: 50 ug via INTRAVENOUS
  Administered 2016-04-04 (×2): 100 ug via INTRAVENOUS
  Administered 2016-04-04: 50 ug via INTRAVENOUS
  Administered 2016-04-04: 100 ug via INTRAVENOUS

## 2016-04-04 MED ORDER — MIDAZOLAM HCL 2 MG/2ML IJ SOLN
2.0000 mg | Freq: Once | INTRAMUSCULAR | Status: AC
  Administered 2016-04-04: 2 mg via INTRAVENOUS

## 2016-04-04 MED ORDER — OXYCODONE-ACETAMINOPHEN 5-325 MG PO TABS: 1.0000 | ORAL_TABLET | ORAL | 0 refills | Status: DC | PRN

## 2016-04-04 MED ORDER — HYDROMORPHONE HCL 1 MG/ML IJ SOLN
INTRAMUSCULAR | Status: AC
  Administered 2016-04-04: 0.5 mg via INTRAVENOUS
  Filled 2016-04-04: qty 1

## 2016-04-04 MED ORDER — PROPOFOL 10 MG/ML IV BOLUS
INTRAVENOUS | Status: AC
  Filled 2016-04-04: qty 20

## 2016-04-04 MED ORDER — SUCCINYLCHOLINE CHLORIDE 200 MG/10ML IV SOSY
PREFILLED_SYRINGE | INTRAVENOUS | Status: AC
  Filled 2016-04-04: qty 10

## 2016-04-04 MED ORDER — LIDOCAINE 2% (20 MG/ML) 5 ML SYRINGE
INTRAMUSCULAR | Status: AC
  Filled 2016-04-04: qty 5

## 2016-04-04 MED ORDER — FENTANYL CITRATE (PF) 100 MCG/2ML IJ SOLN
INTRAMUSCULAR | Status: AC
  Administered 2016-04-04: 100 ug via INTRAVENOUS
  Filled 2016-04-04: qty 2

## 2016-04-04 MED ORDER — METHOCARBAMOL 500 MG PO TABS
500.0000 mg | ORAL_TABLET | Freq: Four times a day (QID) | ORAL | Status: DC | PRN
  Administered 2016-04-04 – 2016-04-06 (×5): 500 mg via ORAL
  Filled 2016-04-04 (×4): qty 1

## 2016-04-04 MED ORDER — 0.9 % SODIUM CHLORIDE (POUR BTL) OPTIME
TOPICAL | Status: DC | PRN
  Administered 2016-04-04: 1000 mL

## 2016-04-04 MED ORDER — OXYCODONE HCL 5 MG PO TABS
5.0000 mg | ORAL_TABLET | ORAL | Status: DC | PRN
  Administered 2016-04-04 – 2016-04-06 (×11): 10 mg via ORAL
  Filled 2016-04-04 (×10): qty 2

## 2016-04-04 MED ORDER — MORPHINE SULFATE (PF) 4 MG/ML IV SOLN
INTRAVENOUS | Status: AC
  Administered 2016-04-04: 2.5 mg via INTRAVENOUS
  Filled 2016-04-04: qty 1

## 2016-04-04 MED ORDER — OXYCODONE HCL ER 20 MG PO T12A
20.0000 mg | EXTENDED_RELEASE_TABLET | Freq: Two times a day (BID) | ORAL | Status: DC
  Administered 2016-04-04 – 2016-04-05 (×2): 20 mg via ORAL
  Filled 2016-04-04 (×2): qty 1

## 2016-04-04 MED ORDER — HYDROMORPHONE HCL 1 MG/ML IJ SOLN
0.2500 mg | INTRAMUSCULAR | Status: DC | PRN
  Administered 2016-04-04 (×4): 0.5 mg via INTRAVENOUS

## 2016-04-04 MED ORDER — BUPIVACAINE HCL (PF) 0.25 % IJ SOLN
INTRAMUSCULAR | Status: AC
  Filled 2016-04-04: qty 30

## 2016-04-04 MED ORDER — DEXAMETHASONE SODIUM PHOSPHATE 10 MG/ML IJ SOLN
10.0000 mg | Freq: Two times a day (BID) | INTRAMUSCULAR | Status: AC
  Administered 2016-04-04 – 2016-04-05 (×3): 10 mg via INTRAVENOUS
  Filled 2016-04-04 (×3): qty 1

## 2016-04-04 MED ORDER — HYDROMORPHONE HCL 1 MG/ML IJ SOLN
1.0000 mg | INTRAMUSCULAR | Status: DC | PRN
  Administered 2016-04-04 – 2016-04-06 (×5): 1 mg via INTRAVENOUS
  Administered 2016-04-06: 2 mg via INTRAVENOUS
  Administered 2016-04-06: 1 mg via INTRAVENOUS
  Filled 2016-04-04: qty 1
  Filled 2016-04-04: qty 2
  Filled 2016-04-04 (×6): qty 1

## 2016-04-04 MED ORDER — FENTANYL CITRATE (PF) 100 MCG/2ML IJ SOLN
INTRAMUSCULAR | Status: AC
  Filled 2016-04-04: qty 4

## 2016-04-04 MED ORDER — ROPIVACAINE HCL 5 MG/ML IJ SOLN
INTRAMUSCULAR | Status: DC | PRN
  Administered 2016-04-04: 30 mL via PERINEURAL

## 2016-04-04 MED ORDER — PHENYLEPHRINE HCL 10 MG/ML IJ SOLN
INTRAMUSCULAR | Status: DC | PRN
  Administered 2016-04-04: 120 ug via INTRAVENOUS
  Administered 2016-04-04 (×2): 80 ug via INTRAVENOUS
  Administered 2016-04-04: 120 ug via INTRAVENOUS
  Administered 2016-04-04: 160 ug via INTRAVENOUS
  Administered 2016-04-04 (×2): 120 ug via INTRAVENOUS

## 2016-04-04 MED ORDER — CHLORHEXIDINE GLUCONATE 4 % EX LIQD: 60.0000 mL | Freq: Once | CUTANEOUS | Status: DC

## 2016-04-04 MED ORDER — METHOCARBAMOL 1000 MG/10ML IJ SOLN
500.0000 mg | Freq: Four times a day (QID) | INTRAVENOUS | Status: DC | PRN
  Filled 2016-04-04: qty 5

## 2016-04-04 MED ORDER — HYDROMORPHONE HCL 1 MG/ML IJ SOLN
INTRAMUSCULAR | Status: DC | PRN
  Administered 2016-04-04 (×2): .5 mg via INTRAVENOUS

## 2016-04-04 MED ORDER — ASPIRIN EC 325 MG PO TBEC: 325.0000 mg | DELAYED_RELEASE_TABLET | Freq: Two times a day (BID) | ORAL | 0 refills | Status: DC

## 2016-04-04 MED ORDER — SODIUM CHLORIDE 0.9 % IR SOLN
Status: DC | PRN
  Administered 2016-04-04: 3000 mL

## 2016-04-04 MED ORDER — LIDOCAINE 2% (20 MG/ML) 5 ML SYRINGE
INTRAMUSCULAR | Status: AC
  Filled 2016-04-04: qty 10

## 2016-04-04 MED ORDER — METHOCARBAMOL 500 MG PO TABS
ORAL_TABLET | ORAL | Status: AC
  Administered 2016-04-04: 500 mg via ORAL
  Filled 2016-04-04: qty 1

## 2016-04-04 MED ORDER — FENTANYL CITRATE (PF) 100 MCG/2ML IJ SOLN
INTRAMUSCULAR | Status: AC
Start: 2016-04-04 — End: 2016-04-04
  Filled 2016-04-04: qty 2

## 2016-04-04 MED ORDER — METHOCARBAMOL 750 MG PO TABS
750.0000 mg | ORAL_TABLET | Freq: Three times a day (TID) | ORAL | 0 refills | Status: DC | PRN
Start: 2016-04-04 — End: 2016-04-04

## 2016-04-04 MED ORDER — LIDOCAINE HCL (CARDIAC) 20 MG/ML IV SOLN
INTRAVENOUS | Status: DC | PRN
  Administered 2016-04-04: 60 mg via INTRAVENOUS

## 2016-04-04 MED ORDER — KETOROLAC TROMETHAMINE 15 MG/ML IJ SOLN
15.0000 mg | Freq: Four times a day (QID) | INTRAMUSCULAR | Status: AC
  Administered 2016-04-04 – 2016-04-05 (×6): 15 mg via INTRAVENOUS
  Filled 2016-04-04 (×5): qty 1

## 2016-04-04 MED ORDER — CEFAZOLIN SODIUM-DEXTROSE 2-4 GM/100ML-% IV SOLN
2.0000 g | Freq: Four times a day (QID) | INTRAVENOUS | Status: AC
  Administered 2016-04-04 – 2016-04-05 (×2): 2 g via INTRAVENOUS
  Filled 2016-04-04 (×2): qty 100

## 2016-04-04 MED ORDER — SODIUM CHLORIDE 0.9 % IV SOLN
INTRAVENOUS | Status: DC
  Administered 2016-04-04: 22:00:00 via INTRAVENOUS

## 2016-04-04 MED ORDER — DOCUSATE SODIUM 100 MG PO CAPS
100.0000 mg | ORAL_CAPSULE | Freq: Two times a day (BID) | ORAL | Status: DC
Start: 2016-04-04 — End: 2016-04-06
  Administered 2016-04-04 – 2016-04-06 (×4): 100 mg via ORAL
  Filled 2016-04-04 (×4): qty 1

## 2016-04-04 MED ORDER — BUPIVACAINE HCL (PF) 0.5 % IJ SOLN
INTRAMUSCULAR | Status: DC | PRN
  Administered 2016-04-04: 40 mL

## 2016-04-04 MED ORDER — TRANEXAMIC ACID 1000 MG/10ML IV SOLN
1000.0000 mg | INTRAVENOUS | Status: AC
  Administered 2016-04-04: 1000 mg via INTRAVENOUS
  Filled 2016-04-04: qty 10

## 2016-04-04 MED ORDER — ACETAMINOPHEN 650 MG RE SUPP: 650.0000 mg | Freq: Four times a day (QID) | RECTAL | Status: DC | PRN

## 2016-04-04 MED ORDER — ACETAMINOPHEN 10 MG/ML IV SOLN
INTRAVENOUS | Status: AC
  Filled 2016-04-04: qty 100

## 2016-04-04 MED ORDER — ALUM & MAG HYDROXIDE-SIMETH 200-200-20 MG/5ML PO SUSP: 30.0000 mL | ORAL | Status: DC | PRN

## 2016-04-04 MED ORDER — BUPIVACAINE LIPOSOME 1.3 % IJ SUSP
20.0000 mL | INTRAMUSCULAR | Status: DC
  Filled 2016-04-04: qty 20

## 2016-04-04 MED ORDER — MORPHINE SULFATE (PF) 4 MG/ML IV SOLN
2.5000 mg | INTRAVENOUS | Status: DC | PRN
  Administered 2016-04-04 (×2): 2.5 mg via INTRAVENOUS

## 2016-04-04 MED ORDER — BUPIVACAINE LIPOSOME 1.3 % IJ SUSP
INTRAMUSCULAR | Status: DC | PRN
  Administered 2016-04-04: 20 mL

## 2016-04-04 MED ORDER — FENTANYL CITRATE (PF) 100 MCG/2ML IJ SOLN
100.0000 ug | Freq: Once | INTRAMUSCULAR | Status: AC
  Administered 2016-04-04: 100 ug via INTRAVENOUS

## 2016-04-04 MED ORDER — MAGNESIUM CITRATE PO SOLN: 1.0000 | Freq: Once | ORAL | Status: DC | PRN

## 2016-04-04 MED ORDER — OXYCODONE HCL 5 MG PO TABS
ORAL_TABLET | ORAL | Status: AC
  Administered 2016-04-04: 10 mg via ORAL
  Filled 2016-04-04: qty 2

## 2016-04-04 MED ORDER — ONDANSETRON HCL 4 MG/2ML IJ SOLN
INTRAMUSCULAR | Status: DC | PRN
  Administered 2016-04-04: 4 mg via INTRAVENOUS

## 2016-04-04 MED ORDER — OXYCODONE HCL 5 MG PO TABS
ORAL_TABLET | ORAL | Status: AC
  Filled 2016-04-04: qty 2

## 2016-04-04 MED ORDER — ASPIRIN EC 325 MG PO TBEC
325.0000 mg | DELAYED_RELEASE_TABLET | Freq: Two times a day (BID) | ORAL | Status: DC
Start: 2016-04-05 — End: 2016-04-06
  Administered 2016-04-05 – 2016-04-06 (×3): 325 mg via ORAL
  Filled 2016-04-04 (×3): qty 1

## 2016-04-04 MED ORDER — POLYETHYLENE GLYCOL 3350 17 G PO PACK: 17.0000 g | PACK | Freq: Every day | ORAL | Status: DC | PRN

## 2016-04-04 MED ORDER — KETOROLAC TROMETHAMINE 15 MG/ML IJ SOLN
INTRAMUSCULAR | Status: AC
  Administered 2016-04-04: 15 mg via INTRAVENOUS
  Filled 2016-04-04: qty 1

## 2016-04-04 MED ORDER — MIDAZOLAM HCL 2 MG/2ML IJ SOLN
INTRAMUSCULAR | Status: AC
  Filled 2016-04-04: qty 2

## 2016-04-04 MED ORDER — PHENYLEPHRINE 40 MCG/ML (10ML) SYRINGE FOR IV PUSH (FOR BLOOD PRESSURE SUPPORT)
PREFILLED_SYRINGE | INTRAVENOUS | Status: AC
  Filled 2016-04-04: qty 30

## 2016-04-04 MED ORDER — ACETAMINOPHEN 10 MG/ML IV SOLN
INTRAVENOUS | Status: DC | PRN
Start: 2016-04-04 — End: 2016-04-04
  Administered 2016-04-04: 1000 mg via INTRAVENOUS

## 2016-04-04 MED ORDER — BISACODYL 5 MG PO TBEC
5.0000 mg | DELAYED_RELEASE_TABLET | Freq: Every day | ORAL | Status: DC | PRN
Start: 2016-04-04 — End: 2016-04-06

## 2016-04-04 MED ORDER — BUPIVACAINE-EPINEPHRINE (PF) 0.25% -1:200000 IJ SOLN
INTRAMUSCULAR | Status: AC
  Filled 2016-04-04: qty 60

## 2016-04-04 MED ORDER — LACTATED RINGERS IV SOLN
INTRAVENOUS | Status: DC
Start: 2016-04-04 — End: 2016-04-04
  Administered 2016-04-04 (×2): via INTRAVENOUS

## 2016-04-04 MED ORDER — MIDAZOLAM HCL 2 MG/2ML IJ SOLN
INTRAMUSCULAR | Status: AC
  Administered 2016-04-04: 2 mg via INTRAVENOUS
  Filled 2016-04-04: qty 2

## 2016-04-04 MED ORDER — ACETAMINOPHEN 325 MG PO TABS: 650.0000 mg | ORAL_TABLET | Freq: Four times a day (QID) | ORAL | Status: DC | PRN

## 2016-04-04 SURGICAL SUPPLY — 60 items
BANDAGE ESMARK 6X9 LF (GAUZE/BANDAGES/DRESSINGS) ×1 IMPLANT
BENZOIN TINCTURE PRP APPL 2/3 (GAUZE/BANDAGES/DRESSINGS) ×2 IMPLANT
BLADE SAGITTAL 25.0X1.19X90 (BLADE) ×2 IMPLANT
BLADE SAW SAG 90X13X1.27 (BLADE) ×2 IMPLANT
BNDG ESMARK 6X9 LF (GAUZE/BANDAGES/DRESSINGS) ×2
BOWL SMART MIX CTS (DISPOSABLE) ×2 IMPLANT
CAPT KNEE TOTAL 3 ATTUNE ×2 IMPLANT
CEMENT HV SMART SET (Cement) ×4 IMPLANT
COVER SURGICAL LIGHT HANDLE (MISCELLANEOUS) ×2 IMPLANT
CUFF TOURNIQUET SINGLE 34IN LL (TOURNIQUET CUFF) ×2 IMPLANT
CUFF TOURNIQUET SINGLE 44IN (TOURNIQUET CUFF) IMPLANT
DRAPE EXTREMITY T 121X128X90 (DRAPE) ×2 IMPLANT
DRAPE IMP U-DRAPE 54X76 (DRAPES) ×2 IMPLANT
DRAPE U-SHAPE 47X51 STRL (DRAPES) ×2 IMPLANT
DRSG AQUACEL AG ADV 3.5X 6 (GAUZE/BANDAGES/DRESSINGS) ×2 IMPLANT
DRSG AQUACEL AG ADV 3.5X10 (GAUZE/BANDAGES/DRESSINGS) IMPLANT
DRSG MEPILEX BORDER 4X12 (GAUZE/BANDAGES/DRESSINGS) ×2 IMPLANT
DRSG PAD ABDOMINAL 8X10 ST (GAUZE/BANDAGES/DRESSINGS) ×2 IMPLANT
DURAPREP 26ML APPLICATOR (WOUND CARE) ×2 IMPLANT
ELECT REM PT RETURN 9FT ADLT (ELECTROSURGICAL) ×2
ELECTRODE REM PT RTRN 9FT ADLT (ELECTROSURGICAL) ×1 IMPLANT
EVACUATOR 1/8 PVC DRAIN (DRAIN) ×2 IMPLANT
FACESHIELD WRAPAROUND (MASK) ×2 IMPLANT
GAUZE SPONGE 4X4 12PLY STRL (GAUZE/BANDAGES/DRESSINGS) ×2 IMPLANT
GLOVE BIOGEL PI IND STRL 8 (GLOVE) ×2 IMPLANT
GLOVE BIOGEL PI INDICATOR 8 (GLOVE) ×2
GLOVE ECLIPSE 7.5 STRL STRAW (GLOVE) ×4 IMPLANT
GOWN STRL REUS W/ TWL LRG LVL3 (GOWN DISPOSABLE) ×1 IMPLANT
GOWN STRL REUS W/ TWL XL LVL3 (GOWN DISPOSABLE) ×2 IMPLANT
GOWN STRL REUS W/TWL LRG LVL3 (GOWN DISPOSABLE) ×1
GOWN STRL REUS W/TWL XL LVL3 (GOWN DISPOSABLE) ×2
HANDPIECE INTERPULSE COAX TIP (DISPOSABLE) ×1
HOOD PEEL AWAY FACE SHEILD DIS (HOOD) ×6 IMPLANT
IMMOBILIZER KNEE 20 (SOFTGOODS) IMPLANT
IMMOBILIZER KNEE 22 UNIV (SOFTGOODS) ×2 IMPLANT
KIT BASIN OR (CUSTOM PROCEDURE TRAY) ×2 IMPLANT
KIT ROOM TURNOVER OR (KITS) ×2 IMPLANT
MANIFOLD NEPTUNE II (INSTRUMENTS) ×2 IMPLANT
NEEDLE SPNL 22GX3.5 QUINCKE BK (NEEDLE) ×2 IMPLANT
NS IRRIG 1000ML POUR BTL (IV SOLUTION) ×2 IMPLANT
PACK TOTAL JOINT (CUSTOM PROCEDURE TRAY) ×2 IMPLANT
PACK UNIVERSAL I (CUSTOM PROCEDURE TRAY) ×2 IMPLANT
PAD ARMBOARD 7.5X6 YLW CONV (MISCELLANEOUS) ×4 IMPLANT
PAD CAST 4YDX4 CTTN HI CHSV (CAST SUPPLIES) ×1 IMPLANT
PADDING CAST COTTON 4X4 STRL (CAST SUPPLIES) ×1
SET HNDPC FAN SPRY TIP SCT (DISPOSABLE) ×1 IMPLANT
STAPLER VISISTAT 35W (STAPLE) IMPLANT
STRIP CLOSURE SKIN 1/2X4 (GAUZE/BANDAGES/DRESSINGS) ×4 IMPLANT
SUCTION FRAZIER HANDLE 10FR (MISCELLANEOUS) ×1
SUCTION TUBE FRAZIER 10FR DISP (MISCELLANEOUS) ×1 IMPLANT
SUT MNCRL AB 3-0 PS2 18 (SUTURE) ×2 IMPLANT
SUT VIC AB 0 CTB1 27 (SUTURE) ×4 IMPLANT
SUT VIC AB 1 CT1 27 (SUTURE) ×2
SUT VIC AB 1 CT1 27XBRD ANBCTR (SUTURE) ×2 IMPLANT
SUT VIC AB 2-0 CTB1 (SUTURE) ×4 IMPLANT
SYR 50ML LL SCALE MARK (SYRINGE) ×2 IMPLANT
TOWEL OR 17X24 6PK STRL BLUE (TOWEL DISPOSABLE) ×2 IMPLANT
TOWEL OR 17X26 10 PK STRL BLUE (TOWEL DISPOSABLE) ×2 IMPLANT
TRAY FOLEY CATH 16FRSI W/METER (SET/KITS/TRAYS/PACK) IMPLANT
WRAP KNEE MAXI GEL POST OP (GAUZE/BANDAGES/DRESSINGS) ×2 IMPLANT

## 2016-04-04 NOTE — Progress Notes (Signed)
Orthopedic Tech Progress Note Patient Details:  Anna Rowe 17-Jul-1976 [id number]  CPM Right Knee CPM Right Knee: On Right Knee Flexion (Degrees): 90 Right Knee Extension (Degrees): 0   Karolee Stamps 04/04/2016, 8:17 PM

## 2016-04-04 NOTE — Brief Op Note (Signed)
04/04/2016  4:29 PM  PATIENT:  Anna Rowe  40 y.o. female  PRE-OPERATIVE DIAGNOSIS:  OSTEOARTHRITIS RIGHT KNEE  POST-OPERATIVE DIAGNOSIS:  OSTEOARTHRITIS RIGHT KNEE  PROCEDURE:  Procedure(s): TOTAL KNEE ARTHROPLASTY (Right)  SURGEON:  Surgeon(s) and Role:    * Dorna Leitz, MD - Primary  PHYSICIAN ASSISTANT:   ASSISTANTS: bethune   ANESTHESIA:   general  EBL:  Total I/O In: 1000 [I.V.:1000] Out: 50 [Blood:50]  BLOOD ADMINISTERED:none  DRAINS: none   LOCAL MEDICATIONS USED:  MARCAINE    and OTHER experel  SPECIMEN:  No Specimen  DISPOSITION OF SPECIMEN:  N/A  COUNTS:  YES  TOURNIQUET:   Total Tourniquet Time Documented: Thigh (Right) - 63 minutes Total: Thigh (Right) - 63 minutes   DICTATION: .Other Dictation: Dictation Number [id number]  PLAN OF CARE: Admit to inpatient   PATIENT DISPOSITION:  PACU - hemodynamically stable.   Delay start of Pharmacological VTE agent (>24hrs) due to surgical blood loss or risk of bleeding: no

## 2016-04-04 NOTE — Anesthesia Postprocedure Evaluation (Signed)
Anesthesia Post Note  Patient: Anna Rowe  Procedure(s) Performed: Procedure(s) (LRB): TOTAL KNEE ARTHROPLASTY (Right)  Patient location during evaluation: PACU Anesthesia Type: General Level of consciousness: awake and alert Pain management: pain level not controlled (difficult to control her pain. large tolerance to narcotics) Vital Signs Assessment: post-procedure vital signs reviewed and stable Respiratory status: spontaneous breathing, nonlabored ventilation, respiratory function stable and patient connected to nasal cannula oxygen Cardiovascular status: blood pressure returned to baseline and stable Postop Assessment: no signs of nausea or vomiting Anesthetic complications: no    Last Vitals:  Vitals:   04/04/16 1955 04/04/16 2010  BP: (!) 131/91 140/88  Pulse: 91 85  Resp: 14 20  Temp:      Last Pain:  Vitals:   04/04/16 2025  TempSrc:   PainSc: Asleep                 Afra Tricarico,JAMES TERRILL

## 2016-04-04 NOTE — Anesthesia Preprocedure Evaluation (Addendum)
Anesthesia Evaluation  Patient identified by MRN, date of birth, ID band Patient awake    Reviewed: Allergy & Precautions, H&P , NPO status , Patient's Chart, lab work & pertinent test results  Airway Mallampati: II  TM Distance: >3 FB Neck ROM: Full    Dental no notable dental hx. (+) Teeth Intact, Dental Advisory Given   Pulmonary Current Smoker,    Pulmonary exam normal breath sounds clear to auscultation       Cardiovascular negative cardio ROS   Rhythm:Regular Rate:Normal     Neuro/Psych  Headaches, negative psych ROS   GI/Hepatic negative GI ROS, Neg liver ROS,   Endo/Other  negative endocrine ROSMorbid obesity  Renal/GU negative Renal ROS  negative genitourinary   Musculoskeletal   Abdominal   Peds  Hematology negative hematology ROS (+)   Anesthesia Other Findings   Reproductive/Obstetrics negative OB ROS                            Anesthesia Physical Anesthesia Plan  ASA: II  Anesthesia Plan: Regional and General   Post-op Pain Management: GA combined w/ Regional for post-op pain   Induction: Intravenous  Airway Management Planned: LMA  Additional Equipment:   Intra-op Plan:   Post-operative Plan: Extubation in OR  Informed Consent: I have reviewed the patients History and Physical, chart, labs and discussed the procedure including the risks, benefits and alternatives for the proposed anesthesia with the patient or authorized representative who has indicated his/her understanding and acceptance.   Dental advisory given  Plan Discussed with: CRNA  Anesthesia Plan Comments:        Anesthesia Quick Evaluation

## 2016-04-04 NOTE — Discharge Instructions (Signed)

## 2016-04-04 NOTE — Transfer of Care (Signed)
Immediate Anesthesia Transfer of Care Note  Patient: Anna Rowe  Procedure(s) Performed: Procedure(s): TOTAL KNEE ARTHROPLASTY (Right)  Patient Location: PACU  Anesthesia Type:General  Level of Consciousness: awake, alert , oriented and patient cooperative  Airway & Oxygen Therapy: Patient Spontanous Breathing and Patient connected to nasal cannula oxygen  Post-op Assessment: Report given to RN and Post -op Vital signs reviewed and stable  Post vital signs: Reviewed and stable  Last Vitals:  Vitals:   04/04/16 1320 04/04/16 1325  BP: 138/85 (!) 170/128  Pulse: 100 94  Resp: 16 18  Temp:      Last Pain:  Vitals:   04/04/16 1234  TempSrc: Oral  PainSc:       Patients Stated Pain Goal: 3 (67/54/49 2010)  Complications: No apparent anesthesia complications

## 2016-04-04 NOTE — Op Note (Signed)
NAMEMarland Kitchen  ETOLA, MULL        ACCOUNT NO.:  [id number]  MEDICAL RECORD NO.:  [id number]  LOCATION:  5N29C                        FACILITY:  [facility]  PHYSICIAN:  Alta Corning, M.D.   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  PROCEDURE:  Right total knee replacement with Attune system size 4 femur, size 5 tibia, size 6 polyethylene bearing, and a 38 mm all- polyethylene patella.  SURGEON:  Alta Corning, M.D.  ASSISTANT:  Gary Fleet, P.A.  ANESTHESIA:  General.  BRIEF HISTORY:  Ms. Dena is a 40 year old female with a long history significant for complaints of right knee pain.  She had been treated conservatively for a period of time; and after failure of all conservative care, she was taken to the operating room for right total knee replacement.  Preoperatively, the patient was having night pain and light activity pain.  She failed conservative care including activity modification, physical therapy, and injection therapy.  The patient had a work-related injury some years ago, and this was all felt to be an extension of that work-related injury.  DESCRIPTION OF PROCEDURE:  The patient was taken to the operating room. After adequate anesthesia was obtained with general anesthetic, the patient was placed in supine on the operating room table.  The right leg was prepped and draped in usual sterile fashion.  Following this, the leg was exsanguinated, blood pressure tourniquet was inflated 300 mmHg. Following this, a midline incision was made in the subcutaneous tissue down to the level of extensor  mechanism and a medial parapatellar arthrotomy was undertaken.  Once this is completed, the attention turned to the knee exposure.  Anterior and posterior cruciates were removed, retropatellar fat pad, synovium on the anterior aspect of the femur, and the anterior and posterior cruciates.  Once this was done, attention was turned to the femur where an intramedullary pilot hole was drilled and a 9 mm of distal bone was resected followed by sizing of the femur to a 4, anterior and posterior cuts were made, chamfers and box.  Once this was completed, the attention turned to the tibia, cut perpendicular to its long axis and drilled and keeled with a femur which was sized to a size 5.  Once this was completed, attention turned towards the placement of the trial components; the size 5 tibia, size 4 femur, size 6 bearing were put in place and excellent range of motion and stability were achieved.  Attention was turned to the patella, cut down to a level of 13 mm and the paddles were chose for 38 all-poly patella.  The patella was then placed and drilled.  Final poly was placed.  Knee put through a range of motion, excellent stability and range of motion were achieved. All trial components were then removed, and the knee was copiously and thoroughly lavaged, pulsatile lavage, irrigation and suctioned dry.  The final components were  then cemented into place, size 5 tibia, size 4 femur, size 6 bearing trial were placed and a 38 all-poly patella was placed and the knee held in about 10 degrees of flexion with pressure. Once this was done, all excess bone and cement were removed.  The tourniquet was let down.  All bleeding was controlled with electrocautery.  Throughout the cementation process, 60 mL of 20 mL Exparel and 40 mL of Marcaine with epinephrine instilled all throughout the knee for postoperative anesthesia.  At this point, the tourniquet was let down.  All bleeding was controlled with  electrocautery.  The final bits of excessive cement were identified and removed and the final poly was then placed, and the knee put through a range of motion, excellent stability was achieved.  The medial parapatellar arthrotomy was closed with 1 Vicryl running, the skin with 0 and 2-0 Vicryl and 3-0 Monocryl subcuticular.  Benzoin and Steri-Strips were applied.  Sterile compressive dressing was applied after an Aquacel dressing.  The patient was taken to the recovery and noted to be in satisfactory condition. Estimated blood loss for the procedure was minimal.     Alta Corning, M.D.     Corliss Skains  D:  04/04/2016  T:  04/04/2016  Job:  [id number]

## 2016-04-04 NOTE — Anesthesia Procedure Notes (Signed)
Procedure Name: LMA Insertion Date/Time: 04/04/2016 2:48 PM Performed by: Salli Quarry Makaela Cando Pre-anesthesia Checklist: Patient identified, Emergency Drugs available, Suction available and Patient being monitored Patient Re-evaluated:Patient Re-evaluated prior to inductionOxygen Delivery Method: Circle System Utilized Preoxygenation: Pre-oxygenation with 100% oxygen Intubation Type: IV induction Ventilation: Mask ventilation without difficulty LMA: LMA inserted LMA Size: 4.0 Number of attempts: 1 Placement Confirmation: positive ETCO2 Tube secured with: Tape Dental Injury: Teeth and Oropharynx as per pre-operative assessment

## 2016-04-04 NOTE — Anesthesia Procedure Notes (Addendum)
Anesthesia Regional Block:  Adductor canal block  Pre-Anesthetic Checklist: ,, timeout performed, Correct Patient, Correct Site, Correct Laterality, Correct Procedure, Correct Position, site marked, Risks and benefits discussed, pre-op evaluation,  At surgeon's request and post-op pain management  Laterality: Right  Prep: Maximum Sterile Barrier Precautions used, chloraprep       Needles:  Injection technique: Single-shot  Needle Type: Echogenic Stimulator Needle     Needle Length: 9cm 9 cm Needle Gauge: 21 and 21 G    Additional Needles:  Procedures: ultrasound guided (picture in chart) Adductor canal block Narrative:  Start time: 04/04/2016 1:07 PM End time: 04/04/2016 1:17 PM Injection made incrementally with aspirations every 5 mL. Anesthesiologist: Roderic Palau  Additional Notes: 2% Lidocaine skin wheel.

## 2016-04-05 ENCOUNTER — Encounter (HOSPITAL_COMMUNITY): Payer: Self-pay | Admitting: Orthopedic Surgery

## 2016-04-05 LAB — BASIC METABOLIC PANEL
Anion gap: 11 (ref 5–15)
BUN: 7 mg/dL (ref 6–20)
CO2: 19 mmol/L — ABNORMAL LOW (ref 22–32)
Calcium: 9.1 mg/dL (ref 8.9–10.3)
Chloride: 106 mmol/L (ref 101–111)
Creatinine, Ser: 0.98 mg/dL (ref 0.44–1.00)
GFR calc Af Amer: >60 mL/min (ref >60)
GFR calc non Af Amer: >60 mL/min (ref >60)
Glucose, Bld: 211 mg/dL — ABNORMAL HIGH (ref 65–99)
Potassium: 3.5 mmol/L (ref 3.5–5.1)
Sodium: 136 mmol/L (ref 135–145)

## 2016-04-05 LAB — CBC
HCT: 37.7 % (ref 36.0–46.0)
Hemoglobin: 11.8 g/dL — ABNORMAL LOW (ref 12.0–15.0)
MCH: 22.5 pg — ABNORMAL LOW (ref 26.0–34.0)
MCHC: 31.3 g/dL (ref 30.0–36.0)
MCV: 71.8 fL — ABNORMAL LOW (ref 78.0–100.0)
Platelets: 399 10*3/uL (ref 150–400)
RBC: 5.25 MIL/uL — ABNORMAL HIGH (ref 3.87–5.11)
RDW: 16.3 % — ABNORMAL HIGH (ref 11.5–15.5)
WBC: 10.6 10*3/uL — ABNORMAL HIGH (ref 4.0–10.5)

## 2016-04-05 MED ORDER — NICOTINE 14 MG/24HR TD PT24
14.0000 mg | MEDICATED_PATCH | Freq: Every day | TRANSDERMAL | Status: DC
  Administered 2016-04-06: 14 mg via TRANSDERMAL
  Filled 2016-04-05 (×2): qty 1

## 2016-04-05 MED ORDER — PNEUMOCOCCAL VAC POLYVALENT 25 MCG/0.5ML IJ INJ: 0.5000 mL | INJECTION | INTRAMUSCULAR | Status: DC | PRN

## 2016-04-05 MED ORDER — OXYCODONE HCL ER 15 MG PO T12A
30.0000 mg | EXTENDED_RELEASE_TABLET | Freq: Two times a day (BID) | ORAL | Status: DC
  Administered 2016-04-05 – 2016-04-06 (×2): 30 mg via ORAL
  Filled 2016-04-05 (×2): qty 2

## 2016-04-05 MED ORDER — CELECOXIB 200 MG PO CAPS
200.0000 mg | ORAL_CAPSULE | Freq: Two times a day (BID) | ORAL | Status: DC
  Administered 2016-04-05 – 2016-04-06 (×2): 200 mg via ORAL
  Filled 2016-04-05 (×2): qty 1

## 2016-04-05 NOTE — Progress Notes (Addendum)
Pt has been receiving her PO oxycodone, PO oxycontin and PO robaxin per order. Pt only has one more dose of IV toradol scheduled at 1800. Pt has had no IV dilaudid today. Pt accidentally pulled IV out while walking. Pt isn't receiving anything through the IV except that last dose of Toradol at 1800. Pt has had pain but pain has been tolerable as long as PO medications are kept in her system. Pt refuses ice to knee. Plan is for pt to be discharged tomorrow. Rept to Gaspar Skeeters PA that pt's IV pulled out accidentally. Per his order, will not restart IV at this time. Pt made aware. Pt angry with RN. RN attempted to explain to pt that she would not be getting IV pain meds at home and we needed to continue with the PO pain regimen to make sure that it will work for her at home or change the PO pain medication regimen if needed. Pt glared at this RN. Pt on way, via wheelchair, to do stairs in gym with PT. Pt states, "Don't talk to me". Will continue to monitor.

## 2016-04-05 NOTE — Progress Notes (Signed)
Physical Therapy Treatment Patient Details Name: Anna Rowe MRN: [id number] DOB: 1976/03/05 Today's Date: 04/05/2016    History of Present Illness Patient is a 40 y/o female with hx of migraines presents s/p Rt TKA.     PT Comments    Patient not very happy about having IV pain medications discontinued. Mobility improved from AM session. Tolerated gait training and stair training with min guard-supervision for safety. Tolerated exercises and reviewed handout this session. Encouraged OOB to chair for all meals and exercises throughout the day. Will continue to follow.   Follow Up Recommendations  Home health PT;Supervision for mobility/OOB     Equipment Recommendations  None recommended by PT    Recommendations for Other Services OT consult     Precautions / Restrictions Precautions Precautions: Knee Precaution Booklet Issued: No Precaution Comments: Reviewed no pillow under knee and precautions. Restrictions Weight Bearing Restrictions: Yes RLE Weight Bearing: Weight bearing as tolerated    Mobility  Bed Mobility Overal bed mobility: Needs Assistance Bed Mobility: Supine to Sit;Sit to Supine     Supine to sit: Supervision;HOB elevated Sit to supine: Supervision;HOB elevated   General bed mobility comments: ABle to bring BLEs into/out of bed without assist. No use of rails. HOB elevates at home.  Transfers Overall transfer level: Needs assistance Equipment used: Rolling walker (2 wheeled) Transfers: Sit to/from Stand Sit to Stand: Supervision         General transfer comment: Supervision for safety. Pt twisting and leaning on LLE to get up from surfaces to avoid knee flexion- cues for proper technique.   Ambulation/Gait Ambulation/Gait assistance: Supervision Ambulation Distance (Feet): 150 Feet Assistive device: Rolling walker (2 wheeled) Gait Pattern/deviations: Decreased stance time - right;Decreased step length - left Gait velocity:  decreased Gait velocity interpretation: Below normal speed for age/gender General Gait Details: Cues for knee extension during stance phase to activate quads. Did not wear KI in PM.    Stairs Stairs: Yes   Stair Management: One rail Left;Step to pattern Number of Stairs: 4 General stair comments: Cues for technique and safety.   Wheelchair Mobility    Modified Rankin (Stroke Patients Only)       Balance Overall balance assessment: Needs assistance Sitting-balance support: Feet supported;No upper extremity supported Sitting balance-Leahy Scale: Good     Standing balance support: During functional activity Standing balance-Leahy Scale: Fair                      Cognition Arousal/Alertness: Awake/alert Behavior During Therapy: WFL for tasks assessed/performed Overall Cognitive Status: Within Functional Limits for tasks assessed                      Exercises Total Joint Exercises Quad Sets: Both;10 reps;Supine Towel Squeeze: Both;10 reps;Supine Heel Slides: Right;10 reps;Supine Hip ABduction/ADduction: Right;10 reps;Supine Straight Leg Raises: Right;10 reps;Supine Long Arc Quad: Right;10 reps;Seated    General Comments        Pertinent Vitals/Pain Pain Assessment: 0-10 Pain Score: 5  Pain Location: right knee with mobility Pain Descriptors / Indicators: Sore;Burning Pain Intervention(s): Monitored during session;Ice applied;Premedicated before session    Home Living                      Prior Function            PT Goals (current goals can now be found in the care plan section) Progress towards PT goals: Progressing toward goals    Frequency  7X/week    PT Plan Current plan remains appropriate    Co-evaluation             End of Session Equipment Utilized During Treatment: Gait belt Activity Tolerance: Patient tolerated treatment well Patient left: in bed;with call bell/phone within reach;with family/visitor  present     Time: 7092-9574 PT Time Calculation (min) (ACUTE ONLY): 41 min  Charges:  $Gait Training: 23-37 mins $Therapeutic Exercise: 8-22 mins                    G Codes:      Jasiah Buntin A Aliyah Abeyta 04/05/2016, 4:01 PM Wray Kearns, Simpson, DPT [phone]

## 2016-04-05 NOTE — Progress Notes (Signed)
Two family members of patient came up to the nursing station. Both were angry that pt has been asking for a nicotine patch since yesterday and hasn't received it. This RN apologized and told the family that I was unaware she wanted the nicotine patch and that I had been with the patient since 0700 AM and she had not verbalized this to me. Pt also spoke with Gaspar Skeeters this PM and didn't verbalize to him at that time the need of such. Pt's family and pt assured that this RN would call MD/PA on call and obtain order and administer. PA paged. Will rept to oncoming night RN.

## 2016-04-05 NOTE — Progress Notes (Signed)
Orthopedic Tech Progress Note Patient Details:  Anna Rowe 1975-11-08 [id number]  Patient ID: Anna Rowe, female   DOB: [birth year], 40 y.o.   MRN: [id number] Pt in to much pain to get in cpm. Will call when ready for cpm.  Karolee Stamps 04/05/2016, 6:34 AM

## 2016-04-05 NOTE — Progress Notes (Addendum)
Rept to [employer] PA. Pt laying on side crying in pain. This RN asked if we could increase the patient's PO pain medications. Per his order, will increase pt's oxycontin to 30 mg every twelve hours. Per his order, will restart pt's IV and administer Dilaudid and Toradol per previous order. Gaspar Skeeters PA asked to call into pt's room and speak with pt regarding her plan of care and her concerns.  Pt body language consist with anger with this RN (ie no eye contact, turning away from RN, etc).  Will continue to monitor.

## 2016-04-05 NOTE — Progress Notes (Signed)
RN charting at nurses' station. Pt wheeled in wheelchair to the front of the unit by her significant other. Pt laughing and visiting with her significant other. When pt asked where she was heading pt states that she and her husband "are going outside for a breath of air". Pt instructed that due to the amount pain she has been experiencing and the amount of pain medications including the IV pain medications she has received it is not safe for her to leave the unit or go outside. Pt instructed that she could wheel around the unit and look out the windows. Pt and family member verb understanding. Will continue to monitor.

## 2016-04-05 NOTE — Progress Notes (Signed)
Subjective: 1 Day Post-Op Procedure(s) (LRB): TOTAL KNEE ARTHROPLASTY (Right) Patient reports pain as moderate. Taking by mouth and voiding okay. Not out of bed yet.    Objective: Vital signs in last 24 hours: Temp:  [97.2 F (36.2 C)-98.4 F (36.9 C)] 97.6 F (36.4 C) (08/22 0636) Pulse Rate:  [75-100] 79 (08/22 0636) Resp:  [12-28] 15 (08/22 0636) BP: (121-170)/(66-128) 121/84 (08/22 0636) SpO2:  [94 %-100 %] 99 % (08/22 0636) Weight:  [117.9 kg (260 lb)] 117.9 kg (260 lb) (08/21 1234)  Intake/Output from previous day: 08/21 0701 - 08/22 0700 In: 2000 [I.V.:1900; IV Piggyback:100] Out: 350 [Urine:300; Blood:50] Intake/Output this shift: No intake/output data recorded.   Recent Labs  04/05/16 0458  HGB 11.8*    Recent Labs  04/05/16 0458  WBC 10.6*  RBC 5.25*  HCT 37.7  PLT 399    Recent Labs  04/05/16 0458  NA 136  K 3.5  CL 106  CO2 19*  BUN 7  CREATININE 0.98  GLUCOSE 211*  CALCIUM 9.1   No results for input(s): LABPT, INR in the last 72 hours. Right knee exam: Neurovascular intact Sensation intact distally Intact pulses distally Dorsiflexion/Plantar flexion intact Incision: dressing C/D/I Compartment soft  Assessment/Plan: 1 Day Post-Op Procedure(s) (LRB): TOTAL KNEE ARTHROPLASTY (Right) Plan: Up with therapy Discharge home with home health probably tomorrow. Aspirin 325 mg twice daily along with SCDs for DVT prophylaxis. Continue CPM machine.  [place] G 04/05/2016, 8:58 AM

## 2016-04-05 NOTE — Progress Notes (Addendum)
This RN in to administer IV dilaudid due to unrelieved pain. Pt now laughing and talkative. Pt no longer turns away from RN. Pt now makes eye contact. Family at bedside.

## 2016-04-05 NOTE — Care Management Note (Addendum)
Case Management Note  Patient Details  Name: Anna Rowe MRN: [id number] Date of Birth: October 29, 1975  Subjective/Objective:  40 yr old female s/p left total knee arthroplasty.        Action/Plan: case manager spoke with patient concerning home health and DME needs. Patient is under worker's compensation. CM called patient's adjuster Bertram Gala @ [phone], faxed order for Home Health PT to her at [phone]. Patient will have family support at discharge.   Expected Discharge Date:   04/06/16               Expected Discharge Plan:  [place]  In-House Referral:  NA  Discharge planning Services  CM Consult  Post Acute Care Choice:  Home Health Choice offered to:  Patient (Patient under worker's comp. they will arrange for [facility] agency)  DME Arranged:   (Patient has rolling walker, 3in1 and CPM at the home) DME Agency:  Other - Comment (patient states [place] delivered DME)  HH Arranged:  PT HH Agency:     Status of Service:  In process, will continue to follow  If discussed at Long Length of Stay Meetings, dates discussed:    Additional Comments: CM received call from Danny Lawless with [facility], states patient will receive Home Health therapy through [facility]. Requested [facility] Order, OP note and PT eval be faxed to him @ [phone]. Remo Lipps states he has contacted [facility] and they will begin therapy within 48 hrs of discharge. CM faxed requested information. CM also spoke with patient again conerning faxing and discharge plan.   Ninfa Meeker, RN 04/05/2016, 12:55 PM

## 2016-04-05 NOTE — Progress Notes (Signed)
RN offered patient nicotine patch. Patient refused. Nursing will continue to monitor.

## 2016-04-05 NOTE — Evaluation (Signed)
Physical Therapy Evaluation Patient Details Name: Anna Rowe MRN: [id number] DOB: 1975/11/24 Today's Date: 04/05/2016   History of Present Illness  Patient is a 40 y/o female with hx of migraines presents s/p Rt TKA.   Clinical Impression  Patient presents with pain and post surgical deficits RLE s/p Rt TKA. Increased time to perform all mobility. Pt tearful during session due to pain. Tolerated short distance ambulation with Min A for safety but pt reluctant to place weight through RLE. Instructed pt in exercises and explained importance of knee AROM, mobility, precautions and positioning. Pt plans to discharge home with support from spouse, mother and sister. Will follow acutely to maximize independence and mobility prior to return home.    Follow Up Recommendations Home health PT;Supervision for mobility/OOB    Equipment Recommendations  None recommended by PT    Recommendations for Other Services OT consult     Precautions / Restrictions Precautions Precautions: Knee Precaution Booklet Issued: No Precaution Comments: Reviewed no pillow under knee and precautions. Required Braces or Orthoses: Knee Immobilizer - Right Knee Immobilizer - Right: On when out of bed or walking Restrictions Weight Bearing Restrictions: Yes RLE Weight Bearing: Weight bearing as tolerated      Mobility  Bed Mobility Overal bed mobility: Needs Assistance Bed Mobility: Supine to Sit     Supine to sit: Min guard;HOB elevated     General bed mobility comments: Pt using UEs to bring RLE to EOB. No assist needed. Use of rails.  Transfers Overall transfer level: Needs assistance Equipment used: Rolling walker (2 wheeled) Transfers: Sit to/from Stand Sit to Stand: Min assist         General transfer comment: Min A to boost from EOB with cues for hand placement/technique as pt pulling up on RW. Stood from toilet x1. Transferred to chair post ambulation  bout.  Ambulation/Gait Ambulation/Gait assistance: Min guard Ambulation Distance (Feet): 25 Feet Assistive device: Rolling walker (2 wheeled) Gait Pattern/deviations: Decreased stance time - right;Decreased step length - left;Trunk flexed;Decreased weight shift to right Gait velocity: decreased Gait velocity interpretation: Below normal speed for age/gender General Gait Details: Slow, unsteady gait. Pt reluctant to place weight through RLE. Wearing KI but reports it feels like it is pushing knee posteriorly. Tearful.   Stairs            Wheelchair Mobility    Modified Rankin (Stroke Patients Only)       Balance Overall balance assessment: Needs assistance Sitting-balance support: Feet supported;No upper extremity supported Sitting balance-Leahy Scale: Good Sitting balance - Comments: Able to perform pericare without difficulty.    Standing balance support: During functional activity Standing balance-Leahy Scale: Poor Standing balance comment: Requires UE support ins tanding. Declines going to sink to wash hands due to not wanting to stand there due to pain.                             Pertinent Vitals/Pain Pain Assessment: 0-10 Pain Score: 8  Pain Location: right knee (lateral aspect) Pain Descriptors / Indicators: Burning;Sore Pain Intervention(s): Limited activity within patient's tolerance;Monitored during session;Patient requesting pain meds-RN notified;Repositioned;Ice applied    Home Living Family/patient expects to be discharged to:: Private residence Living Arrangements: Spouse/significant other;Parent;Other relatives Available Help at Discharge: Family;Available 24 hours/day Type of Home: House Home Access: Stairs to enter Entrance Stairs-Rails: Right (and a wall) Entrance Stairs-Number of Steps: 3 and 1/2 Home Layout: One level Home Equipment: Walker - 2  wheels;Bedside commode      Prior Function Level of Independence: Independent          Comments: Drives rarely. At baseline, not able to donn socks/shoes.     Hand Dominance        Extremity/Trunk Assessment   Upper Extremity Assessment: Defer to OT evaluation           Lower Extremity Assessment: RLE deficits/detail RLE Deficits / Details: Limited AROM/strength secondary to pain/post surgery       Communication   Communication: No difficulties  Cognition Arousal/Alertness: Awake/alert Behavior During Therapy: WFL for tasks assessed/performed Overall Cognitive Status: Within Functional Limits for tasks assessed                      General Comments General comments (skin integrity, edema, etc.): Sister present during session.    Exercises Total Joint Exercises Ankle Circles/Pumps: Both;10 reps;Supine Quad Sets: Both;5 reps;Supine Heel Slides: Right;5 reps;Seated Long Arc Quad: Right;5 reps;Seated Goniometric ROM: 15-75 degrees knee AROM.      Assessment/Plan    PT Assessment Patient needs continued PT services  PT Diagnosis Difficulty walking;Acute pain   PT Problem List Decreased strength;Decreased mobility;Decreased range of motion;Decreased activity tolerance;Decreased balance;Pain  PT Treatment Interventions DME instruction;Therapeutic activities;Gait training;Therapeutic exercise;Patient/family education;Stair training;Balance training   PT Goals (Current goals can be found in the Care Plan section) Acute Rehab PT Goals Patient Stated Goal: to make this pain go away PT Goal Formulation: With patient Time For Goal Achievement: 04/19/16 Potential to Achieve Goals: Fair    Frequency 7X/week   Barriers to discharge        Co-evaluation               End of Session Equipment Utilized During Treatment: Gait belt;Right knee immobilizer Activity Tolerance: Patient limited by pain Patient left: in chair;with call bell/phone within reach;with family/visitor present Nurse Communication: Mobility status         Time:  4174-0814 PT Time Calculation (min) (ACUTE ONLY): 47 min   Charges:   PT Evaluation $PT Eval Low Complexity: 1 Procedure PT Treatments $Gait Training: 8-22 mins $Therapeutic Exercise: 8-22 mins   PT G Codes:        Layli Capshaw A [place] Hazell 04/05/2016, 10:54 AM Wray Kearns, [place], DPT [phone]

## 2016-04-05 NOTE — Progress Notes (Signed)
Inpatient Diabetes Program Recommendations  AACE/ADA: New Consensus Statement on Inpatient Glycemic Control (2015)  Target Ranges:  Prepandial:   less than 140 mg/dL      Peak postprandial:   less than 180 mg/dL (1-2 hours)      Critically ill patients:  140 - 180 mg/dL   Results for DALANA, PFAHLER (MRN [id number]) as of 04/05/2016 10:58  Ref. Range 04/05/2016 04:58  Glucose Latest Ref Range: 65 - 99 mg/dL 211 (H)   Review of Glycemic Control  Diabetes history: No Outpatient Diabetes medications: NA Current orders for Inpatient glycemic control: None  Inpatient Diabetes Program Recommendations: Correction (SSI): Patient is ordered Decadron 10 mg Q12H which is likely cause of hyperglcyemia. While inpatient and ordered steroids, please consider ordering CBGs with Novolog correction scale ACHS. HgbA1C: Please consider ordering an A1C to evaluate glycemic control over the past 2-3 months.  Thanks, Barnie Alderman, RN, MSN, CDE Diabetes Coordinator Inpatient Diabetes Program [phone] (Team Pager from [place] to [place]) [phone] ([place] office) [phone] [facility] office) [phone] [facility] office)

## 2016-04-06 LAB — CBC
HCT: 34.2 % — ABNORMAL LOW (ref 36.0–46.0)
Hemoglobin: 10.8 g/dL — ABNORMAL LOW (ref 12.0–15.0)
MCH: 22.3 pg — ABNORMAL LOW (ref 26.0–34.0)
MCHC: 31.6 g/dL (ref 30.0–36.0)
MCV: 70.5 fL — ABNORMAL LOW (ref 78.0–100.0)
Platelets: 357 10*3/uL (ref 150–400)
RBC: 4.85 MIL/uL (ref 3.87–5.11)
RDW: 16.2 % — ABNORMAL HIGH (ref 11.5–15.5)
WBC: 14.3 10*3/uL — ABNORMAL HIGH (ref 4.0–10.5)

## 2016-04-06 NOTE — Discharge Summary (Signed)
Patient ID: Anna Rowe MRN: [id number] DOB/AGE: 04/20/1976 40 y.o.  Admit date: 04/04/2016 Discharge date: 04/06/2016  Admission Diagnoses:  Principal Problem:   Primary osteoarthritis of right knee   Discharge Diagnoses:  Same  Past Medical History:  Diagnosis Date  . Migraines     Surgeries: Procedure(s):Right TOTAL KNEE ARTHROPLASTY on 04/04/2016  Discharged Condition: Improved  Hospital Course: Anna Rowe is an 40 y.o. female who was admitted 04/04/2016 for operative treatment ofPrimary osteoarthritis of right knee. Patient has severe unremitting pain that affects sleep, daily activities, and work/hobbies. After pre-op clearance the patient was taken to the operating room on 04/04/2016 and underwent  Procedure(s): Right TOTAL KNEE ARTHROPLASTY.    Patient was given perioperative antibiotics: Anti-infectives    Start     Dose/Rate Route Frequency Ordered Stop   04/04/16 2200  ceFAZolin (ANCEF) IVPB 2g/100 mL premix     2 g 200 mL/hr over 30 Minutes Intravenous Every 6 hours 04/04/16 2108 04/05/16 0335   04/04/16 1400  ceFAZolin (ANCEF) IVPB 2g/100 mL premix     2 g 200 mL/hr over 30 Minutes Intravenous To [facility] 04/03/16 0909 04/04/16 1452       Patient was given sequential compression devices, early ambulation, and chemoprophylaxis to prevent DVT.On postop day 1 she had some difficulty with pain control. Her IV came out and had to be restarted. On postop day #2 the patient was doing well still had knee pain but it was managed with oral medication. Her vital signs are stable and she was afebrile at the time of discharge. Her right knee dressing was clean and dry. Her calf was soft.  Patient benefited maximally from hospital stay and there were no complications.    Recent vital signs: Patient Vitals for the past 24 hrs:  BP Temp Temp src Pulse Resp SpO2  04/06/16 0607 135/78 98.5 F (36.9 C) Oral 94 18 96 %  04/05/16 2033 (!) 125/28 99.6 F  (37.6 C) - 95 19 98 %  04/05/16 1009 - - - - - 96 %  04/05/16 0909 - - - - - 98 %     Recent laboratory studies:  Recent Labs  04/05/16 0458 04/06/16 0545  WBC 10.6* 14.3*  HGB 11.8* 10.8*  HCT 37.7 34.2*  PLT 399 357  NA 136  --   K 3.5  --   CL 106  --   CO2 19*  --   BUN 7  --   CREATININE 0.98  --   GLUCOSE 211*  --   CALCIUM 9.1  --      Discharge Medications:     Medication List    STOP taking these medications   cyclobenzaprine 10 MG tablet Commonly known as:  FLEXERIL   HYDROcodone-acetaminophen 5-325 MG tablet Commonly known as:  NORCO/VICODIN     TAKE these medications   aspirin EC 325 MG tablet Take 1 tablet (325 mg total) by mouth 2 (two) times daily after a meal. Take x 1 month post op to decrease risk of blood clots.   docusate sodium 100 MG capsule Commonly known as:  COLACE Take 100 mg by mouth 2 (two) times daily as needed.   methocarbamol 750 MG tablet Commonly known as:  ROBAXIN-750 Take 1 tablet (750 mg total) by mouth every 8 (eight) hours as needed for muscle spasms.   nitrofurantoin 100 MG capsule Commonly known as:  MACRODANTIN Take 100 mg by mouth 2 (two) times daily.   oxyCODONE-acetaminophen 5-325 MG  tablet Commonly known as:  PERCOCET/ROXICET Take 1-2 tablets by mouth every 4 (four) hours as needed for severe pain.   OXYCONTIN 20 mg 12 hr tablet Generic drug:  oxyCODONE Take 20 mg by mouth every 12 (twelve) hours.   polyethylene glycol powder powder Commonly known as:  GLYCOLAX/MIRALAX Take 17 g by mouth daily.       Diagnostic Studies: Dg Chest 2 View  Result Date: 03/24/2016 CLINICAL DATA:  Preop for knee replacement EXAM: CHEST  2 VIEW COMPARISON:  None. FINDINGS: No active infiltrate or effusion is seen. Mediastinal and hilar contours are unremarkable. The heart is within normal limits in size. No bony abnormality is seen. IMPRESSION: No active cardiopulmonary disease. Electronically Signed   By: Ivar Drape M.D.    On: 03/24/2016 16:26    Disposition: 07-Left Against Medical Advice or Left Without Being Seen  Discharge Instructions    CPM    Complete by:  As directed   Continuous passive motion machine (CPM):      Use the CPM from 0 to 70 degrees for 8 hours per day.      You may increase by 5-10 per day.  You may break it up into 2 or 3 sessions per day.      Use CPM for 1-2 weeks or until you are told to stop.   Call MD / Call 911    Complete by:  As directed   If you experience chest pain or shortness of breath, CALL 911 and be transported to the hospital emergency room.  If you develope a fever above 101 F, pus (white drainage) or increased drainage or redness at the wound, or calf pain, call your surgeon's office.   Constipation Prevention    Complete by:  As directed   Drink plenty of fluids.  Prune juice may be helpful.  You may use a stool softener, such as Colace (over the counter) 100 mg twice a day.  Use MiraLax (over the counter) for constipation as needed.   Diet general    Complete by:  As directed   Do not put a pillow under the knee. Place it under the heel.    Complete by:  As directed   Increase activity slowly as tolerated    Complete by:  As directed   Weight bearing as tolerated    Complete by:  As directed   Laterality:  right   Extremity:  Lower      Follow-up Information    Rowe,Anna L, MD. Schedule an appointment as soon as possible for a visit in 2 week(s).   Specialty:  Orthopedic Surgery Contact information: [place] [address] [phone]        [place] .   Specialty:  [place] Why:  Someone from [facility] will contact you to arrange start date and time for therapy. Contact information: [address] [place] [address] [phone]            Signed: Erlene Senters 04/06/2016, 8:53 AM

## 2016-04-06 NOTE — Progress Notes (Signed)
Subjective: 2 Days Post-Op Procedure(s) (LRB): TOTAL KNEE ARTHROPLASTY (Right) Patient reports pain as moderate. Had difficulty with pain management yesterday. IV came out and had to be restarted to allow IV access for IV Dilaudid. Doing better this morning. She is taking by mouth and voiding okay.   Objective: Vital signs in last 24 hours: Temp:  [98.5 F (36.9 C)-99.6 F (37.6 C)] 98.5 F (36.9 C) (08/23 0607) Pulse Rate:  [94-95] 94 (08/23 0607) Resp:  [18-19] 18 (08/23 0607) BP: (125-135)/(28-78) 135/78 (08/23 0607) SpO2:  [96 %-98 %] 96 % (08/23 0607)  Intake/Output from previous day: 08/22 0701 - 08/23 0700 In: 140 [I.V.:140] Out: -  Intake/Output this shift: No intake/output data recorded.   Recent Labs  04/05/16 0458 04/06/16 0545  HGB 11.8* 10.8*    Recent Labs  04/05/16 0458 04/06/16 0545  WBC 10.6* 14.3*  RBC 5.25* 4.85  HCT 37.7 34.2*  PLT 399 357    Recent Labs  04/05/16 0458  NA 136  K 3.5  CL 106  CO2 19*  BUN 7  CREATININE 0.98  GLUCOSE 211*  CALCIUM 9.1   No results for input(s): LABPT, INR in the last 72 hours. Right knee exam: Neurovascular intact Sensation intact distally Intact pulses distally Dorsiflexion/Plantar flexion intact Incision: dressing C/D/I Compartment soft  Assessment/Plan: 2 Days Post-Op Procedure(s) (LRB): TOTAL KNEE ARTHROPLASTY (Right) Plan: Aspirin 325 mg twice daily 1 month for DVT prophylaxis. Up with therapy Discharge home with home health Follow-up with Dr. Berenice Primas in 2 weeks. Patient instructed to keep her right knee incision clean and dry.  [place] G 04/06/2016, 8:46 AM

## 2016-04-06 NOTE — Progress Notes (Signed)
Orthopedic Tech Progress Note Patient Details:  Anna Rowe 1976/08/07 [id number]  Patient ID: Anna Rowe, female   DOB: Jul 27, 1976, 40 y.o.   MRN: [id number] Applied cpm 0-70  Karolee Stamps 04/06/2016, 7:11 AM

## 2016-04-06 NOTE — Progress Notes (Signed)
Physical Therapy Treatment Patient Details Name: Anna Rowe MRN: [id number] DOB: 1975/10/06 Today's Date: 04/06/2016    History of Present Illness Patient is a 40 y/o female with hx of migraines presents s/p Rt TKA.     PT Comments    Pt reviewed stair training and HEP in prep for d/c home.  Pt ready to d/c and called ride.  Pt in room awaiting d/c forms from nursing.    Follow Up Recommendations  Home health PT;Supervision for mobility/OOB     Equipment Recommendations  None recommended by PT    Recommendations for Other Services OT consult     Precautions / Restrictions Precautions Precautions: Knee Precaution Booklet Issued: No Precaution Comments: Reviewed no pillow under knee and precautions. Required Braces or Orthoses: Knee Immobilizer - Right Knee Immobilizer - Right: On when out of bed or walking Restrictions Weight Bearing Restrictions: Yes RLE Weight Bearing: Weight bearing as tolerated    Mobility  Bed Mobility Overal bed mobility: Needs Assistance Bed Mobility: Supine to Sit;Sit to Supine     Supine to sit: Supervision;HOB elevated Sit to supine: Supervision;HOB elevated   General bed mobility comments: ABle to bring BLEs into/out of bed without assist. No use of rails. HOB elevates at home.  Transfers Overall transfer level: Needs assistance Equipment used: Rolling walker (2 wheeled) Transfers: Sit to/from Stand Sit to Stand: Modified independent (Device/Increase time)         General transfer comment: Good technique, KI in place.    Ambulation/Gait Ambulation/Gait assistance: Supervision Ambulation Distance (Feet): 200 Feet Assistive device: Rolling walker (2 wheeled) Gait Pattern/deviations: Step-through pattern;Antalgic;Trunk flexed Gait velocity: decreased   General Gait Details: KI donned and cues for upper trunk control and gait symmetry.     Stairs Stairs: Yes Stairs assistance: Supervision Stair Management: One rail  Left;Step to pattern Number of Stairs: 4 General stair comments: Cues for technique and safety.    Wheelchair Mobility    Modified Rankin (Stroke Patients Only)       Balance Overall balance assessment: Needs assistance Sitting-balance support: No upper extremity supported Sitting balance-Leahy Scale: Good       Standing balance-Leahy Scale: Fair                      Cognition Arousal/Alertness: Awake/alert Behavior During Therapy: WFL for tasks assessed/performed Overall Cognitive Status: Within Functional Limits for tasks assessed                      Exercises Total Joint Exercises Ankle Circles/Pumps: Both;10 reps;Supine Quad Sets: Both;10 reps;Supine Heel Slides: AROM;Right;10 reps;Supine Hip ABduction/ADduction: AROM;Right;10 reps;Supine Straight Leg Raises: AROM;Right;10 reps;Supine Long Arc Quad: AROM;Right;10 reps;Supine Goniometric ROM: 71 degrees R knee flexion.      General Comments        Pertinent Vitals/Pain Pain Assessment: 0-10 Pain Score: 5  Pain Location: R knee with mobility Pain Descriptors / Indicators: Sore;Burning Pain Intervention(s): Monitored during session;Ice applied;Repositioned    Home Living                      Prior Function            PT Goals (current goals can now be found in the care plan section) Acute Rehab PT Goals Patient Stated Goal: to make this pain go away Potential to Achieve Goals: Fair Progress towards PT goals: Progressing toward goals    Frequency  7X/week    PT Plan Current plan  remains appropriate    Co-evaluation             End of Session Equipment Utilized During Treatment: Gait belt Activity Tolerance: Patient tolerated treatment well Patient left: in bed;with call bell/phone within reach;with family/visitor present     Time: 0600-4599 PT Time Calculation (min) (ACUTE ONLY): 21 min  Charges:  $Gait Training: 8-22 mins                    G Codes:       Cristela Blue 04-08-2016, 11:16 AM  Governor [place], PTA pager [phone]

## 2016-04-13 ENCOUNTER — Ambulatory Visit (HOSPITAL_COMMUNITY)
Admission: RE | Admit: 2016-04-13 | Discharge: 2016-04-13 | Disposition: A | Discharge status: Home / Self Care | Payer: Worker's Compensation | Source: Ambulatory Visit | Attending: Orthopaedic Surgery | Admitting: Orthopaedic Surgery

## 2016-04-13 ENCOUNTER — Other Ambulatory Visit (HOSPITAL_COMMUNITY): Payer: Self-pay | Admitting: Orthopaedic Surgery

## 2016-04-13 DIAGNOSIS — M79604 Pain in right leg: Secondary | ICD-10-CM | POA: Insufficient documentation

## 2016-04-13 DIAGNOSIS — M7989 Other specified soft tissue disorders: Secondary | ICD-10-CM

## 2016-04-13 DIAGNOSIS — M79601 Pain in right arm: Secondary | ICD-10-CM | POA: Diagnosis not present

## 2016-04-13 NOTE — Progress Notes (Signed)
Preliminary results by tech - Right Lower Ext. Venous Duplex Completed. Negative for deep and superficial vein thrombosis in the right leg. Incidental findings, enlarged lymph nodes in the right groin and proximal thigh.  Oda Cogan, BS, RDMS, RVT

## 2017-01-17 ENCOUNTER — Ambulatory Visit (INDEPENDENT_AMBULATORY_CARE_PROVIDER_SITE_OTHER): Payer: Self-pay | Admitting: Orthopaedic Surgery

## 2017-01-19 ENCOUNTER — Ambulatory Visit (INDEPENDENT_AMBULATORY_CARE_PROVIDER_SITE_OTHER): Payer: 59 | Admitting: Orthopaedic Surgery

## 2017-01-19 DIAGNOSIS — M25551 Pain in right hip: Secondary | ICD-10-CM

## 2017-01-19 DIAGNOSIS — M1611 Unilateral primary osteoarthritis, right hip: Secondary | ICD-10-CM

## 2017-01-19 DIAGNOSIS — M1612 Unilateral primary osteoarthritis, left hip: Secondary | ICD-10-CM

## 2017-01-19 DIAGNOSIS — M25552 Pain in left hip: Secondary | ICD-10-CM | POA: Diagnosis not present

## 2017-01-19 NOTE — Progress Notes (Signed)
Office Visit Note   Patient: Anna Rowe           Date of Birth: [birth year]           MRN: [id number] Visit Date: 01/19/2017              Requested by: Serita Grit, PA-C No address on file PCP: [place]: Visit Diagnoses:  1. Pain of both hip joints   2. Unilateral primary osteoarthritis, left hip   3. Unilateral primary osteoarthritis, right hip     Plan: I do feel that she would benefit from bilateral total hip replacements through direct anterior approach. However I would not do these both at once. She is 295 pounds and has had some problems a diabetic control. I told her that hemoglobin A1c needs to be really be 8 or below to safely do this procedure but I feel that she is trending in the right direction that we would start with her left hip first given the one is hurting her the most. We could then consider doing a right hip at a later date. We will work on getting this scheduled some time hopefully in July pending her continued blood glucose control. I spent a significant amount of time with her showing her hip model and going over her x-rays and explained what hip replacement surgery involves a direct anterior approach. We had a long and detailed and thorough discussion about the risks and benefits of the surgery and what her options are otherwise. All questions were encouraged and answered.  Follow-Up Instructions: Return for 2 weeks post-op.   Orders:  No orders of the defined types were placed in this encounter.  No orders of the defined types were placed in this encounter.     Procedures: No procedures performed   Clinical Data: No additional findings.   Subjective: No chief complaint on file. The patient is a very pleasant 41 year old female who comes in as a referral from Dr. Rochele Pages her pain specialist to evaluate severe arthritis in both of her hips. She denies any previous injury to her hips but  actually had a knee replacement of her right knee due to her work-related injury. The knee replacements performed last year. She him most with a cane and does use a knee brace on her right knee. She says her hips have hurt severely since then. She says that he has pain in the groin on both sides and difficulty ambulating in general. She does have a complicated medical history and the fact that she found out in February that she has severe diabetes and was actually admitted to the hospital. She reports that she had a hemoglobin A1c of the time at about 18 and then her last one earlier this year was 25. She is scheduled to have a hemoglobin A1c drawn again she says in early July. She is been told that she likely needs hip replacements on both of her hips. There is x-rays on canopy system for me to review. Her pain is daily and is detrimentally affects her activities daily living, her mobility, and her quality of life. She feels that the limited mobility of her knee and the injury from her knee is certainly accelerator problems with her hips. She is on metformin now for diabetes and off of insulin. She said her blood sugars daily run below 200. She is on chronic narcotics as a result of the pain in her knee but  also her hips.  HPI  Review of Systems He currently denies any headache, chest pain, shortness of breath, fever, chills, nausea, vomiting.  Objective: Vital Signs: There were no vitals taken for this visit.  Physical Exam She is alert and oriented 3 and in no acute distress. She and relates with a cane and is very slow mobility. Ortho Exam Examination of both hip show severe pain with attempts of internal/external rotation. She also has incredibly stiff hips and I cannot fully internally and x-rays today both hips. Specialty Comments:  No specialty comments available.  Imaging: No results found. X-rays and then we reviewed of both hips and the canopy system do show profound and severe  arthritis of both her hips. She has sclerotic changes about the femoral head and acetabulum. Trigger osteophytes joint space narrowing. Mostly osteophytes are off the acetabulum bilaterally.  PMFS History: Patient Active Problem List   Diagnosis Date Noted  . Primary osteoarthritis of right knee 04/04/2016   Past Medical History:  Diagnosis Date  . Migraines     No family history on file.  Past Surgical History:  Procedure Laterality Date  . ACHILLES TENDON REPAIR    . HERNIA REPAIR     x2  . KNEE ARTHROSCOPY Right 2015  . plantar    . TOTAL KNEE ARTHROPLASTY Right 04/04/2016   Procedure: TOTAL KNEE ARTHROPLASTY;  Surgeon: Dorna Leitz, MD;  Location: [facility];  Service: Orthopedics;  Laterality: Right;   Social History   Occupational History  . Not on file.   Social History Main Topics  . Smoking status: Current Every Day Smoker    Packs/day: 0.50    Years: 17.00    Types: Cigarettes  . Smokeless tobacco: Never Used  . Alcohol use No  . Drug use: No  . Sexual activity: Not on file

## 2017-02-13 NOTE — Progress Notes (Signed)
Please place orders in EPIC as patient is being scheduled for a pre-op appointment! Thank you! 

## 2017-02-14 ENCOUNTER — Telehealth (INDEPENDENT_AMBULATORY_CARE_PROVIDER_SITE_OTHER): Payer: Self-pay

## 2017-02-14 NOTE — Progress Notes (Signed)
Please place orders in EPIC as patient has a pre-op appointment on 02/17/2017! Thank you!

## 2017-02-14 NOTE — Telephone Encounter (Signed)
See below

## 2017-02-14 NOTE — Telephone Encounter (Signed)
Patient called stating she was scheduled for surgery with Dr Ninfa Linden on 02/24/17. She was contacted by hospital on Friday advising her to d/c blood thinners for pre-op.  She is very concerned whether or not she should still proceed with surgery. Seh went for a follow up doppler today and they advised her that she still has blood clot in her RLE and wanted to know if surgery should be cancelled and she should resume blood thinners until clot has resolved. Please call her to discuss. Requesting call [phone]

## 2017-02-17 ENCOUNTER — Encounter (HOSPITAL_COMMUNITY): Admission: RE | Admit: 2017-02-17 | Payer: Self-pay | Source: Ambulatory Visit

## 2017-02-24 ENCOUNTER — Inpatient Hospital Stay (HOSPITAL_COMMUNITY): Admission: RE | Admit: 2017-02-24 | Payer: Self-pay | Source: Ambulatory Visit | Admitting: Orthopaedic Surgery

## 2017-02-24 ENCOUNTER — Encounter (HOSPITAL_COMMUNITY): Admission: RE | Payer: Self-pay | Source: Ambulatory Visit

## 2017-02-24 SURGERY — ARTHROPLASTY, HIP, TOTAL, ANTERIOR APPROACH
Anesthesia: Spinal | Laterality: Left

## 2017-03-09 ENCOUNTER — Ambulatory Visit (INDEPENDENT_AMBULATORY_CARE_PROVIDER_SITE_OTHER): Payer: 59 | Admitting: Orthopaedic Surgery

## 2017-03-09 DIAGNOSIS — M1611 Unilateral primary osteoarthritis, right hip: Secondary | ICD-10-CM | POA: Diagnosis not present

## 2017-03-09 DIAGNOSIS — M1612 Unilateral primary osteoarthritis, left hip: Secondary | ICD-10-CM | POA: Diagnosis not present

## 2017-03-09 NOTE — Progress Notes (Signed)
The patient is someone well-known to me. 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 I had scheduled her for hip replacement surgery on June 13 however we found out that she had been dealing with an acute blood clot that occurred in her leg in March of this year. She has been on the blood thinner Xarelto. At this point she is trying to get an idea of how long she'll be on this medication and when surgery can be considered. Apparently a repeat ultrasound in early July still showed a DVT. However we have not been able to review the study. I had a long and thorough discussion about sending her to vein and vascular specialists here in [place] and Dr. Trula Slade to reassess her with new ultrasound studies to assess the DVT and to get an idea of treatment options such as the possibility of a Greenfield filter at some point which could potentially get her into surgery sooner. We will work on making that referral.

## 2017-03-10 ENCOUNTER — Other Ambulatory Visit (INDEPENDENT_AMBULATORY_CARE_PROVIDER_SITE_OTHER): Payer: Self-pay

## 2017-03-10 DIAGNOSIS — M79604 Pain in right leg: Secondary | ICD-10-CM

## 2017-03-13 ENCOUNTER — Ambulatory Visit (HOSPITAL_COMMUNITY)
Admission: RE | Admit: 2017-03-13 | Discharge: 2017-03-13 | Disposition: A | Discharge status: Home / Self Care | Payer: 59 | Source: Ambulatory Visit | Attending: Surgery | Admitting: Surgery

## 2017-03-13 DIAGNOSIS — M79604 Pain in right leg: Secondary | ICD-10-CM

## 2017-03-15 ENCOUNTER — Telehealth (INDEPENDENT_AMBULATORY_CARE_PROVIDER_SITE_OTHER): Payer: Self-pay | Admitting: Orthopaedic Surgery

## 2017-03-15 NOTE — Telephone Encounter (Signed)
PT REQUESTED MRI RESULTS PLEASE ADVISE  [phone]

## 2017-03-16 ENCOUNTER — Encounter (INDEPENDENT_AMBULATORY_CARE_PROVIDER_SITE_OTHER): Payer: Self-pay | Admitting: Physician Assistant

## 2017-03-16 ENCOUNTER — Telehealth (INDEPENDENT_AMBULATORY_CARE_PROVIDER_SITE_OTHER): Payer: Self-pay | Admitting: Radiology

## 2017-03-16 ENCOUNTER — Encounter (INDEPENDENT_AMBULATORY_CARE_PROVIDER_SITE_OTHER): Payer: Self-pay

## 2017-03-16 ENCOUNTER — Other Ambulatory Visit (INDEPENDENT_AMBULATORY_CARE_PROVIDER_SITE_OTHER): Payer: Self-pay

## 2017-03-16 ENCOUNTER — Telehealth (INDEPENDENT_AMBULATORY_CARE_PROVIDER_SITE_OTHER): Payer: Self-pay | Admitting: Physician Assistant

## 2017-03-16 ENCOUNTER — Ambulatory Visit (INDEPENDENT_AMBULATORY_CARE_PROVIDER_SITE_OTHER): Payer: Self-pay | Admitting: Physician Assistant

## 2017-03-16 DIAGNOSIS — M79604 Pain in right leg: Secondary | ICD-10-CM

## 2017-03-16 DIAGNOSIS — Z86718 Personal history of other venous thrombosis and embolism: Secondary | ICD-10-CM

## 2017-03-16 DIAGNOSIS — Z5329 Procedure and treatment not carried out because of patient's decision for other reasons: Secondary | ICD-10-CM

## 2017-03-16 NOTE — Telephone Encounter (Signed)
Do me a favor and call her back and make her and appt with Artis Delay or Ninfa Linden. They don't like to discuss results over the phone, they want FU appts ((Also, I don't see any results in the chart yet))

## 2017-03-16 NOTE — Telephone Encounter (Signed)
Anna Rowe returns today due to the Doppler results right lower leg. 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 No superficial venous thrombosis. Therefore will send her to vascular surgery for consultation for 2 evaluate for possible temporary Greenfield filter system that she can undergo hip surgery. She has severe pain in both hips. No charge was encountered by the patient today.

## 2017-03-16 NOTE — Telephone Encounter (Signed)
Referral made to vascular with Dr. Trula Slade

## 2017-03-29 ENCOUNTER — Encounter: Payer: Self-pay | Admitting: Surgery

## 2017-04-04 NOTE — Progress Notes (Signed)
Appointment was cancelled, patient not seen.

## 2017-04-10 ENCOUNTER — Ambulatory Visit (INDEPENDENT_AMBULATORY_CARE_PROVIDER_SITE_OTHER): Payer: 59 | Admitting: Surgery

## 2017-04-10 ENCOUNTER — Encounter: Payer: Self-pay | Admitting: *Deleted

## 2017-04-10 VITALS — BP 122/91 | HR 92 | Ht 72.0 in | Wt 298.7 lb

## 2017-04-10 DIAGNOSIS — I82431 Acute embolism and thrombosis of right popliteal vein: Secondary | ICD-10-CM | POA: Diagnosis not present

## 2017-04-10 NOTE — Progress Notes (Signed)
[facility]ist [facility]  Patient name: Anna Rowe MRN: [id number] DOB: 12/26/1975 Sex: female   REQUESTING PROVIDER:    Dr. Ninfa Linden   REASON FOR CONSULT:    DVT  HISTORY OF PRESENT ILLNESS:   Anna Rowe is a 41 y.o. female, who is Referred today for further evaluation of a right leg popliteal DVT which she developed in March 2018.  The week prior to this, she had been in the ICU and 8, for new onset diabetes with a blood sugar greater than 1000.  She was scheduled to have left hip replacement in July but a preoperative repeat ultrasound showed residual chronic right popliteal DVT.  The surgery was canceled and will pass a surgery consultation was requested.  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  She is in need of bilateral knee replacement.  She had a new onset of diabetes.  She is a current smoker.   PAST MEDICAL HISTORY    Past Medical History:  Diagnosis Date  . Migraines      FAMILY HISTORY   Negative for premature cardiovascular disease  SOCIAL HISTORY:   Social History   Social History  . Marital status: Married    Spouse name: N/A  . Number of children: N/A  . Years of education: N/A   Occupational History  . Not on file.   Social History Main Topics  . Smoking status: Current Every Day Smoker    Packs/day: 0.50    Years: 17.00    Types: Cigarettes  . Smokeless tobacco: Never Used  . Alcohol use No  . Drug use: No  . Sexual activity: Not on file   Other Topics Concern  . Not on file   Social History Narrative  . No narrative on file    ALLERGIES:    Allergies  Allergen Reactions  . Methocarbamol Swelling  . Tramadol Anaphylaxis    Tolerates Dilaudid, Oxycontin 04/04/16  . Bee Venom Hives    CURRENT MEDICATIONS:    Current Outpatient Prescriptions  Medication Sig Dispense Refill  . amitriptyline (ELAVIL) 50  MG tablet Take 50 mg by mouth at bedtime.  2  . CHANTIX CONTINUING MONTH PAK 1 MG tablet Take 1 mg by mouth 2 (two) times daily.  4  . Cholecalciferol (EQL VITAMIN D3) 2000 units CAPS Take 2,000 Units by mouth daily.    . clotrimazole-betamethasone (LOTRISONE) cream Apply 1 application topically 2 (two) times daily.  3  . cyclobenzaprine (FLEXERIL) 10 MG tablet Take 10 mg by mouth 3 (three) times daily.  1  . docusate sodium (COLACE) 100 MG capsule Take 100 mg by mouth 2 (two) times daily.   6  . folic acid (FOLVITE) 1 MG tablet Take 1 mg by mouth daily.  5  . gabapentin (NEURONTIN) 600 MG tablet Take 600 mg by mouth 3 (three) times daily.  3  . medroxyPROGESTERone (DEPO-PROVERA) 150 MG/ML injection Inject into the muscle every 3 (three) months. Last dose June 2018  3  . meloxicam (MOBIC) 15 MG tablet Take 15 mg by mouth daily.  1  . metFORMIN (GLUCOPHAGE-XR) 500 MG 24 hr tablet Take 500 mg by mouth 2 (two) times daily.  3  . Multiple Vitamin (MULTIVITAMIN WITH MINERALS) TABS tablet Take 1 tablet by mouth daily.    Glory Rosebush VERIO test strip 3 (three) times daily. for testing  5  . Oxycodone HCl 10 MG TABS Take 10 mg  by mouth every 8 (eight) hours as needed. for pain  0  . OXYCONTIN 10 MG 12 hr tablet Take 10 mg by mouth every 12 (twelve) hours.  0  . polyethylene glycol powder (GLYCOLAX/MIRALAX) powder Take 17 g by mouth daily as needed for mild constipation.   6  . rosuvastatin (CRESTOR) 10 MG tablet Take 10 mg by mouth at bedtime.  5  . XARELTO 20 MG TABS tablet Take 20 mg by mouth daily.  0  . insulin regular (NOVOLIN R,HUMULIN R) 100 units/mL injection Inject into the skin.     No current facility-administered medications for this visit.     REVIEW OF SYSTEMS:   [X]  denotes positive finding, [ ]  denotes negative finding Cardiac  Comments:  Chest pain or chest pressure:    Shortness of breath upon exertion:    Short of breath when lying flat:    Irregular heart rhythm:          Vascular    Pain in calf, thigh, or hip brought on by ambulation:    Pain in feet at night that wakes you up from your sleep:     Blood clot in your veins:    Leg swelling:  x       Pulmonary    Oxygen at home:    Productive cough:     Wheezing:         Neurologic    Sudden weakness in arms or legs:     Sudden numbness in arms or legs:     Sudden onset of difficulty speaking or slurred speech:    Temporary loss of vision in one eye:     Problems with dizziness:         Gastrointestinal    Blood in stool:      Vomited blood:         Genitourinary    Burning when urinating:     Blood in urine:        Psychiatric    Major depression:         Hematologic    Bleeding problems:    Problems with blood clotting too easily:        Skin    Rashes or ulcers:        Constitutional    Fever or chills:     PHYSICAL EXAM:   Vitals:   04/10/17 1129  BP: (!) 122/91  Pulse: 92  SpO2: 97%  Weight: 298 lb 11.2 oz (135.5 kg)  Height: 6' (1.829 m)    GENERAL: The patient is a well-nourished female, in no acute distress. The vital signs are documented above. CARDIAC: There is a regular rate and rhythm.  PULMONARY: Nonlabored respirations ABDOMEN: Soft and non-tender with normal pitched bowel sounds.  MUSCULOSKELETAL: There are no major deformities or cyanosis. NEUROLOGIC: No focal weakness or paresthesias are detected. SKIN: There are no ulcers or rashes noted. PSYCHIATRIC: The patient has a normal affect.  STUDIES:   I have reviewed her ultrasound which shows chronic right leg popliteal DVT  ASSESSMENT and PLAN   Right leg DVT following a prolonged ICU stay, and the setting for need of bilateral hip replacement.  I think that it is reasonable to proceed with hip replacement surgery, however I would maintain the patient's anticoagulation (Xaralto) up until 48 hours prior to her operation and restarting it as soon as possible.  I would also recommend placing a preoperative  in fear vena cava filter.  This  should be able to be left in place until both hips have been addressed and then removing it one she has completed her recovery.  I discussed with the patient that the filter would not prevent DVT but would protect her from a life-threatening pulmonary embolism.  I discussed that a have the ability to be permanent, however I would recommend removing the filter when appropriate.  She is going to contact Dr. Rush Farmer to schedule her surgery, and I will be contacted to place her IVC filter on a Tuesday prior to her hip replacement.  She will not need to stop her Jennye Moccasin for this procedure   [name], MD [facility] of [facility] [phone] Pager [phone]

## 2017-04-12 ENCOUNTER — Other Ambulatory Visit: Payer: Self-pay | Admitting: *Deleted

## 2017-04-18 NOTE — Progress Notes (Signed)
Please place orders in EPIC as patient is being scheduled for a pre-op appointment! Thank you! 

## 2017-04-20 ENCOUNTER — Other Ambulatory Visit (INDEPENDENT_AMBULATORY_CARE_PROVIDER_SITE_OTHER): Payer: Self-pay | Admitting: Orthopaedic Surgery

## 2017-04-21 ENCOUNTER — Other Ambulatory Visit (INDEPENDENT_AMBULATORY_CARE_PROVIDER_SITE_OTHER): Payer: Self-pay | Admitting: Orthopaedic Surgery

## 2017-04-24 ENCOUNTER — Encounter (HOSPITAL_COMMUNITY): Payer: Self-pay

## 2017-04-24 ENCOUNTER — Other Ambulatory Visit (HOSPITAL_COMMUNITY): Payer: Self-pay | Admitting: Emergency Medicine

## 2017-04-24 ENCOUNTER — Other Ambulatory Visit (INDEPENDENT_AMBULATORY_CARE_PROVIDER_SITE_OTHER): Payer: Self-pay | Admitting: Physician Assistant

## 2017-04-24 ENCOUNTER — Encounter (HOSPITAL_COMMUNITY)
Admission: RE | Admit: 2017-04-24 | Discharge: 2017-04-24 | Disposition: A | Discharge status: Home / Self Care | Payer: 59 | Source: Ambulatory Visit | Attending: Orthopaedic Surgery | Admitting: Orthopaedic Surgery

## 2017-04-24 HISTORY — DX: Hyperlipidemia, unspecified: E78.5

## 2017-04-24 HISTORY — DX: Sleep apnea, unspecified: G47.30

## 2017-04-24 HISTORY — DX: Acute embolism and thrombosis of unspecified deep veins of unspecified lower extremity: I82.409

## 2017-04-24 LAB — CBC
HCT: 41.2 % (ref 36.0–46.0)
Hemoglobin: 13.7 g/dL (ref 12.0–15.0)
MCH: 22.9 pg — ABNORMAL LOW (ref 26.0–34.0)
MCHC: 33.3 g/dL (ref 30.0–36.0)
MCV: 69 fL — ABNORMAL LOW (ref 78.0–100.0)
Platelets: 359 10*3/uL (ref 150–400)
RBC: 5.97 MIL/uL — ABNORMAL HIGH (ref 3.87–5.11)
RDW: 16.7 % — ABNORMAL HIGH (ref 11.5–15.5)
WBC: 9.6 10*3/uL (ref 4.0–10.5)

## 2017-04-24 LAB — BASIC METABOLIC PANEL
Anion gap: 8 (ref 5–15)
BUN: 13 mg/dL (ref 6–20)
CO2: 22 mmol/L (ref 22–32)
Calcium: 9.9 mg/dL (ref 8.9–10.3)
Chloride: 108 mmol/L (ref 101–111)
Creatinine, Ser: 0.72 mg/dL (ref 0.44–1.00)
GFR calc Af Amer: >60 mL/min (ref >60)
GFR calc non Af Amer: >60 mL/min (ref >60)
Glucose, Bld: 86 mg/dL (ref 65–99)
Potassium: 3.9 mmol/L (ref 3.5–5.1)
Sodium: 138 mmol/L (ref 135–145)

## 2017-04-24 LAB — SURGICAL PCR SCREEN
MRSA, PCR: NEGATIVE
Staphylococcus aureus: NEGATIVE

## 2017-04-24 LAB — GLUCOSE, CAPILLARY: Glucose-Capillary: 116 mg/dL — ABNORMAL HIGH (ref 65–99)

## 2017-04-24 LAB — HCG, SERUM, QUALITATIVE: Preg, Serum: NEGATIVE

## 2017-04-24 NOTE — Progress Notes (Signed)
At PAT visit, patient  Refused to have her hgA1C checked. Pt states that her insurance will only pay for her to have it checked every three months and is due to have it checked again in October. RN informed patient that her most recent A1c was collected in 02-2017 which puts it outside the 60 day window to use for pre-op labs. Pt verbalized understanding. RN also explained that although  patient has a right to refuse lab work , her surgery may potentially be delayed as a result. Pt also verbalized understanding to this and maintained her refusal of lab work, but verbalized agreement to have other lab work collected.

## 2017-04-24 NOTE — Progress Notes (Signed)
LOV/Clearance Dr Trula Slade vascular surgery 04-10-17 epic progress note .  Vascular ultrasound 03-13-17 epic   CXR 03-24-16 epic

## 2017-04-24 NOTE — Patient Instructions (Addendum)
Anna Rowe  04/24/2017   Your procedure is scheduled on: 04-28-17  Report to [facility] Main  Entrance   Report to admitting at [place]   Call this number if you have problems the morning of surgery [phone]   Remember: ONLY 1 PERSON MAY GO WITH YOU TO SHORT STAY TO GET  READY MORNING OF YOUR SURGERY.  Do not eat food or drink liquids :After Midnight.     Take these medicines the morning of surgery with A SIP OF WATER: gabapentin if needed, oxycodone if needed                                You may not have any metal on your body including hair pins and              piercings  Do not wear jewelry, make-up, lotions, powders or perfumes, deodorant             Do not wear nail polish.  Do not shave  48 hours prior to surgery.    Do not bring valuables to the hospital. [place].  Contacts, dentures or bridgework may not be worn into surgery.  Leave suitcase in the car. After surgery it may be brought to your room.               Please read over the following fact sheets you were given: _____________________________________________________________________             How to Manage Your Diabetes Before and After Surgery  Why is it important to control my blood sugar before and after surgery? . Improving blood sugar levels before and after surgery helps healing and can limit problems. . A way of improving blood sugar control is eating a healthy diet by: o  Eating less sugar and carbohydrates o  Increasing activity/exercise o  Talking with your doctor about reaching your blood sugar goals . High blood sugars (greater than 180 mg/dL) can raise your risk of infections and slow your recovery, so you will need to focus on controlling your diabetes during the weeks before surgery. . Make sure that the doctor who takes care of your diabetes knows about your planned surgery including the date  and location.  How do I manage my blood sugar before surgery? . Check your blood sugar at least 4 times a day, starting 2 days before surgery, to make sure that the level is not too high or low. o Check your blood sugar the morning of your surgery when you wake up and every 2 hours until you get to the Short Stay unit. . If your blood sugar is less than 70 mg/dL, you will need to treat for low blood sugar: o Do not take insulin. o Treat a low blood sugar (less than 70 mg/dL) with  cup of clear juice (cranberry or apple), 4 glucose tablets, OR glucose gel. o Recheck blood sugar in 15 minutes after treatment (to make sure it is greater than 70 mg/dL). If your blood sugar is not greater than 70 mg/dL on recheck, call [phone] for further instructions. . Report your blood sugar to the short stay nurse when you get to Short Stay.  . If you  are admitted to the hospital after surgery: o Your blood sugar will be checked by the staff and you will probably be given insulin after surgery (instead of oral diabetes medicines) to make sure you have good blood sugar levels. o The goal for blood sugar control after surgery is 80-180 mg/dL.   WHAT DO I DO ABOUT MY DIABETES MEDICATION?   . THE DAY BEFORE SURGERY, o  take  usual  Breakfast and lunch doses of NOVOLIN R INSULIN . DO NO TAKE DINNER/EVENING dose!! o Take METFORMIN as usual        . THE MORNING OF SURGERY, o If your CBG is greater than 220 mg/dL, you may take  of your sliding scale  (correction) dose of NOVOLIN R insulin. o DO NOT TAKE METFORMIN!!  Patient Signature:  Date:   Nurse Signature:  Date:   Reviewed and Endorsed by [facility] Patient Education Committee, August 2015  [facility] - Preparing for Surgery Before surgery, you can play an important role.  Because skin is not sterile, your skin needs to be as free of germs as possible.  You can reduce the number of germs on your skin by washing with CHG (chlorahexidine  gluconate) soap before surgery.  CHG is an antiseptic cleaner which kills germs and bonds with the skin to continue killing germs even after washing. Please DO NOT use if you have an allergy to CHG or antibacterial soaps.  If your skin becomes reddened/irritated stop using the CHG and inform your nurse when you arrive at Short Stay. Do not shave (including legs and underarms) for at least 48 hours prior to the first CHG shower.  You may shave your face/neck. Please follow these instructions carefully:  1.  Shower with CHG Soap the night before surgery and the  morning of Surgery.  2.  If you choose to wash your hair, wash your hair first as usual with your  normal  shampoo.  3.  After you shampoo, rinse your hair and body thoroughly to remove the  shampoo.                           4.  Use CHG as you would any other liquid soap.  You can apply chg directly  to the skin and wash                       Gently with a scrungie or clean washcloth.  5.  Apply the CHG Soap to your body ONLY FROM THE NECK DOWN.   Do not use on face/ open                           Wound or open sores. Avoid contact with eyes, ears mouth and genitals (private parts).                       Wash face,  Genitals (private parts) with your normal soap.             6.  Wash thoroughly, paying special attention to the area where your surgery  will be performed.  7.  Thoroughly rinse your body with warm water from the neck down.  8.  DO NOT shower/wash with your normal soap after using and rinsing off  the CHG Soap.  9.  Pat yourself dry with a clean towel.            10.  Wear clean pajamas.            11.  Place clean sheets on your bed the night of your first shower and do not  sleep with pets. Day of Surgery : Do not apply any lotions/deodorants the morning of surgery.  Please wear clean clothes to the hospital/surgery center.  FAILURE TO FOLLOW THESE INSTRUCTIONS MAY RESULT IN THE CANCELLATION OF YOUR  SURGERY PATIENT SIGNATURE_________________________________  NURSE SIGNATURE__________________________________  ________________________________________________________________________   Anna Rowe  An incentive spirometer is a tool that can help keep your lungs clear and active. This tool measures how well you are filling your lungs with each breath. Taking long deep breaths may help reverse or decrease the chance of developing breathing (pulmonary) problems (especially infection) following:  A long period of time when you are unable to move or be active. BEFORE THE PROCEDURE   If the spirometer includes an indicator to show your best effort, your nurse or respiratory therapist will set it to a desired goal.  If possible, sit up straight or lean slightly forward. Try not to slouch.  Hold the incentive spirometer in an upright position. INSTRUCTIONS FOR USE  1. Sit on the edge of your bed if possible, or sit up as far as you can in bed or on a chair. 2. Hold the incentive spirometer in an upright position. 3. Breathe out normally. 4. Place the mouthpiece in your mouth and seal your lips tightly around it. 5. Breathe in slowly and as deeply as possible, raising the piston or the ball toward the top of the column. 6. Hold your breath for 3-5 seconds or for as long as possible. Allow the piston or ball to fall to the bottom of the column. 7. Remove the mouthpiece from your mouth and breathe out normally. 8. Rest for a few seconds and repeat Steps 1 through 7 at least 10 times every 1-2 hours when you are awake. Take your time and take a few normal breaths between deep breaths. 9. The spirometer may include an indicator to show your best effort. Use the indicator as a goal to work toward during each repetition. 10. After each set of 10 deep breaths, practice coughing to be sure your lungs are clear. If you have an incision (the cut made at the time of surgery), support your  incision when coughing by placing a pillow or rolled up towels firmly against it. Once you are able to get out of bed, walk around indoors and cough well. You may stop using the incentive spirometer when instructed by your caregiver.  RISKS AND COMPLICATIONS  Take your time so you do not get dizzy or light-headed.  If you are in pain, you may need to take or ask for pain medication before doing incentive spirometry. It is harder to take a deep breath if you are having pain. AFTER USE  Rest and breathe slowly and easily.  It can be helpful to keep track of a log of your progress. Your caregiver can provide you with a simple table to help with this. If you are using the spirometer at home, follow these instructions: [place] IF:   You are having difficultly using the spirometer.  You have trouble using the spirometer as often as instructed.  Your pain medication is not giving enough relief while using the spirometer.  You  develop fever of 100.5 F (38.1 C) or higher. SEEK IMMEDIATE MEDICAL CARE IF:   You cough up bloody sputum that had not been present before.  You develop fever of 102 F (38.9 C) or greater.  You develop worsening pain at or near the incision site. MAKE SURE YOU:   Understand these instructions.  Will watch your condition.  Will get help right away if you are not doing well or get worse. Document Released: 12/12/2006 Document Revised: 10/24/2011 Document Reviewed: 02/12/2007 [facility] Patient Information 2014 [place], [place].   ________________________________________________________________________

## 2017-04-25 ENCOUNTER — Other Ambulatory Visit: Payer: Self-pay

## 2017-04-25 ENCOUNTER — Encounter (HOSPITAL_COMMUNITY)
Admission: RE | Disposition: A | Discharge status: Home / Self Care | Payer: Self-pay | Source: Ambulatory Visit | Attending: Surgery

## 2017-04-25 ENCOUNTER — Ambulatory Visit (HOSPITAL_COMMUNITY)
Admission: RE | Admit: 2017-04-25 | Discharge: 2017-04-25 | Disposition: A | Discharge status: Home / Self Care | Payer: 59 | Source: Ambulatory Visit | Attending: Surgery | Admitting: Surgery

## 2017-04-25 DIAGNOSIS — Z7901 Long term (current) use of anticoagulants: Secondary | ICD-10-CM

## 2017-04-25 DIAGNOSIS — F17210 Nicotine dependence, cigarettes, uncomplicated: Secondary | ICD-10-CM | POA: Insufficient documentation

## 2017-04-25 DIAGNOSIS — Z794 Long term (current) use of insulin: Secondary | ICD-10-CM

## 2017-04-25 DIAGNOSIS — G43909 Migraine, unspecified, not intractable, without status migrainosus: Secondary | ICD-10-CM | POA: Insufficient documentation

## 2017-04-25 DIAGNOSIS — Z86718 Personal history of other venous thrombosis and embolism: Secondary | ICD-10-CM

## 2017-04-25 DIAGNOSIS — E119 Type 2 diabetes mellitus without complications: Secondary | ICD-10-CM

## 2017-04-25 DIAGNOSIS — Z408 Encounter for other prophylactic surgery: Secondary | ICD-10-CM

## 2017-04-25 HISTORY — PX: IVC FILTER INSERTION: CATH118245

## 2017-04-25 LAB — COMPREHENSIVE METABOLIC PANEL
ALT: 21 U/L (ref 14–54)
AST: 20 U/L (ref 15–41)
Albumin: 3.6 g/dL (ref 3.5–5.0)
Alkaline Phosphatase: 59 U/L (ref 38–126)
Anion gap: 7 (ref 5–15)
BUN: 8 mg/dL (ref 6–20)
CO2: 25 mmol/L (ref 22–32)
Calcium: 9 mg/dL (ref 8.9–10.3)
Chloride: 104 mmol/L (ref 101–111)
Creatinine, Ser: 0.81 mg/dL (ref 0.44–1.00)
GFR calc Af Amer: >60 mL/min (ref >60)
GFR calc non Af Amer: >60 mL/min (ref >60)
Glucose, Bld: 104 mg/dL — ABNORMAL HIGH (ref 65–99)
Potassium: 3.9 mmol/L (ref 3.5–5.1)
Sodium: 136 mmol/L (ref 135–145)
Total Bilirubin: 0.8 mg/dL (ref 0.3–1.2)
Total Protein: 6.8 g/dL (ref 6.5–8.1)

## 2017-04-25 LAB — CBC
HCT: 39.6 % (ref 36.0–46.0)
Hemoglobin: 12.7 g/dL (ref 12.0–15.0)
MCH: 22.2 pg — ABNORMAL LOW (ref 26.0–34.0)
MCHC: 32.1 g/dL (ref 30.0–36.0)
MCV: 69.4 fL — ABNORMAL LOW (ref 78.0–100.0)
Platelets: 354 10*3/uL (ref 150–400)
RBC: 5.71 MIL/uL — ABNORMAL HIGH (ref 3.87–5.11)
RDW: 16.6 % — ABNORMAL HIGH (ref 11.5–15.5)
WBC: 8.6 10*3/uL (ref 4.0–10.5)

## 2017-04-25 LAB — POCT I-STAT, CHEM 8
BUN: 12 mg/dL (ref 6–20)
Calcium, Ion: 1.19 mmol/L (ref 1.15–1.40)
Chloride: 103 mmol/L (ref 101–111)
Creatinine, Ser: 0.7 mg/dL (ref 0.44–1.00)
Glucose, Bld: 114 mg/dL — ABNORMAL HIGH (ref 65–99)
HCT: 42 % (ref 36.0–46.0)
Hemoglobin: 14.3 g/dL (ref 12.0–15.0)
Potassium: 5.3 mmol/L — ABNORMAL HIGH (ref 3.5–5.1)
Sodium: 139 mmol/L (ref 135–145)
TCO2: 28 mmol/L (ref 22–32)

## 2017-04-25 LAB — GLUCOSE, CAPILLARY: Glucose-Capillary: 86 mg/dL (ref 65–99)

## 2017-04-25 LAB — HCG, SERUM, QUALITATIVE: Preg, Serum: NEGATIVE

## 2017-04-25 SURGERY — IVC FILTER INSERTION
Anesthesia: LOCAL

## 2017-04-25 MED ORDER — FENTANYL CITRATE (PF) 100 MCG/2ML IJ SOLN
INTRAMUSCULAR | Status: AC
  Filled 2017-04-25: qty 2

## 2017-04-25 MED ORDER — SODIUM CHLORIDE 0.9% FLUSH: 3.0000 mL | Freq: Two times a day (BID) | INTRAVENOUS | Status: DC

## 2017-04-25 MED ORDER — SODIUM CHLORIDE 0.9 % IV SOLN
INTRAVENOUS | Status: DC
  Administered 2017-04-25: 09:00:00 via INTRAVENOUS

## 2017-04-25 MED ORDER — MIDAZOLAM HCL 2 MG/2ML IJ SOLN
INTRAMUSCULAR | Status: AC
  Filled 2017-04-25: qty 2

## 2017-04-25 MED ORDER — SODIUM CHLORIDE 0.9 % IV SOLN: INTRAVENOUS | Status: DC

## 2017-04-25 MED ORDER — SODIUM CHLORIDE 0.9 % WEIGHT BASED INFUSION: 1.0000 mL/kg/h | INTRAVENOUS | Status: DC

## 2017-04-25 MED ORDER — IODIXANOL 320 MG/ML IV SOLN
INTRAVENOUS | Status: DC | PRN
  Administered 2017-04-25: 40 mL via INTRAVENOUS

## 2017-04-25 MED ORDER — HYDRALAZINE HCL 20 MG/ML IJ SOLN: 5.0000 mg | INTRAMUSCULAR | Status: DC | PRN

## 2017-04-25 MED ORDER — HEPARIN (PORCINE) IN NACL 2-0.9 UNIT/ML-% IJ SOLN
INTRAMUSCULAR | Status: AC | PRN
  Administered 2017-04-25: 1000 mL

## 2017-04-25 MED ORDER — HEPARIN (PORCINE) IN NACL 2-0.9 UNIT/ML-% IJ SOLN
INTRAMUSCULAR | Status: AC
  Filled 2017-04-25: qty 500

## 2017-04-25 MED ORDER — SODIUM CHLORIDE 0.9 % IV SOLN: 250.0000 mL | INTRAVENOUS | Status: DC | PRN

## 2017-04-25 MED ORDER — LIDOCAINE HCL (PF) 1 % IJ SOLN
INTRAMUSCULAR | Status: AC
  Filled 2017-04-25: qty 30

## 2017-04-25 MED ORDER — MIDAZOLAM HCL 2 MG/2ML IJ SOLN
INTRAMUSCULAR | Status: DC | PRN
  Administered 2017-04-25: 1 mg via INTRAVENOUS
  Administered 2017-04-25: 2 mg via INTRAVENOUS
  Administered 2017-04-25: 1 mg via INTRAVENOUS

## 2017-04-25 MED ORDER — DEXTROSE 5 % IV SOLN
3.0000 g | INTRAVENOUS | Status: AC
  Filled 2017-04-25: qty 3000

## 2017-04-25 MED ORDER — SODIUM CHLORIDE 0.9% FLUSH: 3.0000 mL | INTRAVENOUS | Status: DC | PRN

## 2017-04-25 MED ORDER — FENTANYL CITRATE (PF) 100 MCG/2ML IJ SOLN
INTRAMUSCULAR | Status: DC | PRN
  Administered 2017-04-25 (×2): 25 ug via INTRAVENOUS
  Administered 2017-04-25: 50 ug via INTRAVENOUS

## 2017-04-25 MED ORDER — LIDOCAINE HCL (PF) 1 % IJ SOLN
INTRAMUSCULAR | Status: DC | PRN
  Administered 2017-04-25: 20 mL via INTRADERMAL

## 2017-04-25 MED ORDER — LABETALOL HCL 5 MG/ML IV SOLN: 10.0000 mg | INTRAVENOUS | Status: DC | PRN

## 2017-04-25 SURGICAL SUPPLY — 10 items
CATH OMNI FLUSH 5F 65CM (CATHETERS) ×2 IMPLANT
COVER PRB 48X5XTLSCP FOLD TPE (BAG) ×1 IMPLANT
COVER PROBE 5X48 (BAG) ×1
FILTER VC CELECT-FEMORAL (Filter) ×2 IMPLANT
KIT MICROINTRODUCER STIFF 5F (SHEATH) ×2 IMPLANT
KIT PV (KITS) ×2 IMPLANT
SYR MEDRAD MARK V 150ML (SYRINGE) ×2 IMPLANT
TRANSDUCER W/STOPCOCK (MISCELLANEOUS) ×2 IMPLANT
TRAY PV CATH (CUSTOM PROCEDURE TRAY) ×2 IMPLANT
WIRE BENTSON .035X145CM (WIRE) ×2 IMPLANT

## 2017-04-25 NOTE — Interval H&P Note (Signed)
History and Physical Interval Note:  04/25/2017 9:51 AM  Anna Rowe  has presented today for surgery, with the diagnosis of history of dvt - hip replacement  The various methods of treatment have been discussed with the patient and family. After consideration of risks, benefits and other options for treatment, the patient has consented to  Procedure(s): IVC Filter Insertion (N/A) as a surgical intervention .  The patient's history has been reviewed, patient examined, no change in status, stable for surgery.  I have reviewed the patient's chart and labs.  Questions were answered to the patient's satisfaction.     [name]

## 2017-04-25 NOTE — H&P (View-Only) (Signed)
Vascular and Vein Specialist of [facility]  Patient name: Anna Rowe MRN: [id number] DOB: 21-Jul-1976 Sex: female   REQUESTING PROVIDER:    Dr. Ninfa Linden   REASON FOR CONSULT:    DVT  HISTORY OF PRESENT ILLNESS:   Anna Rowe is a 41 y.o. female, who is Referred today for further evaluation of a right leg popliteal DVT which she developed in March 2018.  The week prior to this, she had been in the ICU and 8, for new onset diabetes with a blood sugar greater than 1000.  She was scheduled to have left hip replacement in July but a preoperative repeat ultrasound showed residual chronic right popliteal DVT.  The surgery was canceled and will pass a surgery consultation was requested.  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  She is in need of bilateral knee replacement.  She had a new onset of diabetes.  She is a current smoker.   PAST MEDICAL HISTORY    Past Medical History:  Diagnosis Date  . Migraines      FAMILY HISTORY   Negative for premature cardiovascular disease  SOCIAL HISTORY:   Social History   Social History  . Marital status: Married    Spouse name: N/A  . Number of children: N/A  . Years of education: N/A   Occupational History  . Not on file.   Social History Main Topics  . Smoking status: Current Every Day Smoker    Packs/day: 0.50    Years: 17.00    Types: Cigarettes  . Smokeless tobacco: Never Used  . Alcohol use No  . Drug use: No  . Sexual activity: Not on file   Other Topics Concern  . Not on file   Social History Narrative  . No narrative on file    ALLERGIES:    Allergies  Allergen Reactions  . Methocarbamol Swelling  . Tramadol Anaphylaxis    Tolerates Dilaudid, Oxycontin 04/04/16  . Bee Venom Hives    CURRENT MEDICATIONS:    Current Outpatient Prescriptions  Medication Sig Dispense Refill  . amitriptyline (ELAVIL) 50  MG tablet Take 50 mg by mouth at bedtime.  2  . CHANTIX CONTINUING MONTH PAK 1 MG tablet Take 1 mg by mouth 2 (two) times daily.  4  . Cholecalciferol (EQL VITAMIN D3) 2000 units CAPS Take 2,000 Units by mouth daily.    . clotrimazole-betamethasone (LOTRISONE) cream Apply 1 application topically 2 (two) times daily.  3  . cyclobenzaprine (FLEXERIL) 10 MG tablet Take 10 mg by mouth 3 (three) times daily.  1  . docusate sodium (COLACE) 100 MG capsule Take 100 mg by mouth 2 (two) times daily.   6  . folic acid (FOLVITE) 1 MG tablet Take 1 mg by mouth daily.  5  . gabapentin (NEURONTIN) 600 MG tablet Take 600 mg by mouth 3 (three) times daily.  3  . medroxyPROGESTERone (DEPO-PROVERA) 150 MG/ML injection Inject into the muscle every 3 (three) months. Last dose June 2018  3  . meloxicam (MOBIC) 15 MG tablet Take 15 mg by mouth daily.  1  . metFORMIN (GLUCOPHAGE-XR) 500 MG 24 hr tablet Take 500 mg by mouth 2 (two) times daily.  3  . Multiple Vitamin (MULTIVITAMIN WITH MINERALS) TABS tablet Take 1 tablet by mouth daily.    Glory Rosebush VERIO test strip 3 (three) times daily. for testing  5  . Oxycodone HCl 10 MG TABS Take 10 mg  by mouth every 8 (eight) hours as needed. for pain  0  . OXYCONTIN 10 MG 12 hr tablet Take 10 mg by mouth every 12 (twelve) hours.  0  . polyethylene glycol powder (GLYCOLAX/MIRALAX) powder Take 17 g by mouth daily as needed for mild constipation.   6  . rosuvastatin (CRESTOR) 10 MG tablet Take 10 mg by mouth at bedtime.  5  . XARELTO 20 MG TABS tablet Take 20 mg by mouth daily.  0  . insulin regular (NOVOLIN R,HUMULIN R) 100 units/mL injection Inject into the skin.     No current facility-administered medications for this visit.     REVIEW OF SYSTEMS:   [X]  denotes positive finding, [ ]  denotes negative finding Cardiac  Comments:  Chest pain or chest pressure:    Shortness of breath upon exertion:    Short of breath when lying flat:    Irregular heart rhythm:          Vascular    Pain in calf, thigh, or hip brought on by ambulation:    Pain in feet at night that wakes you up from your sleep:     Blood clot in your veins:    Leg swelling:  x       Pulmonary    Oxygen at home:    Productive cough:     Wheezing:         Neurologic    Sudden weakness in arms or legs:     Sudden numbness in arms or legs:     Sudden onset of difficulty speaking or slurred speech:    Temporary loss of vision in one eye:     Problems with dizziness:         Gastrointestinal    Blood in stool:      Vomited blood:         Genitourinary    Burning when urinating:     Blood in urine:        Psychiatric    Major depression:         Hematologic    Bleeding problems:    Problems with blood clotting too easily:        Skin    Rashes or ulcers:        Constitutional    Fever or chills:     PHYSICAL EXAM:   Vitals:   04/10/17 1129  BP: (!) 122/91  Pulse: 92  SpO2: 97%  Weight: 298 lb 11.2 oz (135.5 kg)  Height: 6' (1.829 m)    GENERAL: The patient is a well-nourished female, in no acute distress. The vital signs are documented above. CARDIAC: There is a regular rate and rhythm.  PULMONARY: Nonlabored respirations ABDOMEN: Soft and non-tender with normal pitched bowel sounds.  MUSCULOSKELETAL: There are no major deformities or cyanosis. NEUROLOGIC: No focal weakness or paresthesias are detected. SKIN: There are no ulcers or rashes noted. PSYCHIATRIC: The patient has a normal affect.  STUDIES:   I have reviewed her ultrasound which shows chronic right leg popliteal DVT  ASSESSMENT and PLAN   Right leg DVT following a prolonged ICU stay, and the setting for need of bilateral hip replacement.  I think that it is reasonable to proceed with hip replacement surgery, however I would maintain the patient's anticoagulation (Xaralto) up until 48 hours prior to her operation and restarting it as soon as possible.  I would also recommend placing a preoperative  in fear vena cava filter.  This  should be able to be left in place until both hips have been addressed and then removing it one she has completed her recovery.  I discussed with the patient that the filter would not prevent DVT but would protect her from a life-threatening pulmonary embolism.  I discussed that a have the ability to be permanent, however I would recommend removing the filter when appropriate.  She is going to contact Dr. Rush Farmer to schedule her surgery, and I will be contacted to place her IVC filter on a Tuesday prior to her hip replacement.  She will not need to stop her Jennye Moccasin for this procedure   [name], MD Vascular and Vein Specialists of [facility] [phone] Pager [phone]

## 2017-04-25 NOTE — Discharge Instructions (Signed)
NO METFORMIN/GLUCOPHAGE FOR 2 DAYS    Inferior Vena Cava Filter Insertion, Care After This sheet gives you information about how to care for yourself after your procedure. Your health care provider may also give you more specific instructions. If you have problems or questions, contact your health care provider. What can I expect after the procedure? After your procedure, it is common to have:  Mild pain in the area where the filter was inserted.  Mild bruising in the area where the filter was inserted.  Follow these instructions at home: Insertion site care  Follow instructions from your health care provider about how to take care of the site where a catheter was inserted at your neck or groin (insertion site). Make sure you: ? Wash your hands with soap and water before you change your bandage (dressing). If soap and water are not available, use hand sanitizer. ? Change your dressing as told by your health care provider.  Check your insertion site every day for signs of infection. Check for: ? More redness, swelling, or pain. ? More fluid or blood. ? Warmth. ? Pus or a bad smell.  Keep the insertion site clean and dry.  Do not shower, bathe, use a hot tub, or let the dressing get wet until your health care provider approves. General instructions  Take over-the-counter and prescription medicines only as told by your health care provider.  Avoid heavy lifting or hard activities for 48 hours after the procedure or as told by your health care provider.  Do not drive for 24 hours if you were given a a medicine to help you relax (sedative).  Do not drive or use heavy machinery while taking prescription pain medicine.  Do not go back to school or work until your health care provider approves.  Keep all follow-up visits as told by your health care provider. This is important. Contact a health care provider if:  You have more redness, swelling, or pain around your insertion  site.  You have more fluid or blood coming from your insertion site.  Your insertion site feels warm to the touch.  You have pus or a bad smell coming from your insertion site.  You have a fever.  You are dizzy.  You have nausea and vomiting.  You develop a rash. Get help right away if:  You develop chest pain, a cough, or difficulty breathing.  You develop shortness of breath, feel faint, or pass out.  You cough up blood.  You have severe pain in your abdomen.  You develop swelling and discoloration or pain in your legs.  Your legs become pale and cold or blue.  You develop weakness, difficulty moving your arms or legs, or balance problems.  You develop problems with speech or vision. These symptoms may represent a serious problem that is an emergency. Do not wait to see if the symptoms will go away. Get medical help right away. Call your local emergency services (911 in the [place] Do not drive yourself to the hospital. Summary  After your insertion procedure, it is common to have mild pain and bruising.  Do not shower, bathe, use a hot tub, or let the dressing get wet until your health care provider approves.  Every day, check for signs of infection where a catheter was inserted at your neck or groin (insertion site). This information is not intended to replace advice given to you by your health care provider. Make sure you discuss any questions you have with  your health care provider. Document Released: 05/22/2013 Document Revised: 06/22/2016 Document Reviewed: 06/22/2016 Elsevier Interactive Patient Education  2017 [place]. Moderate Conscious Sedation, Adult, Care After These instructions provide you with information about caring for yourself after your procedure. Your health care provider may also give you more specific instructions. Your treatment has been planned according to current medical practices, but problems sometimes occur. Call your health care  provider if you have any problems or questions after your procedure. What can I expect after the procedure? After your procedure, it is common:  To feel sleepy for several hours.  To feel clumsy and have poor balance for several hours.  To have poor judgment for several hours.  To vomit if you eat too soon.  Follow these instructions at home: For at least 24 hours after the procedure:   Do not: ? Participate in activities where you could fall or become injured. ? Drive. ? Use heavy machinery. ? Drink alcohol. ? Take sleeping pills or medicines that cause drowsiness. ? Make important decisions or sign legal documents. ? Take care of children on your own.  Rest. Eating and drinking  Follow the diet recommended by your health care provider.  If you vomit: ? Drink water, juice, or soup when you can drink without vomiting. ? Make sure you have little or no nausea before eating solid foods. General instructions  Have a responsible adult stay with you until you are awake and alert.  Take over-the-counter and prescription medicines only as told by your health care provider.  If you smoke, do not smoke without supervision.  Keep all follow-up visits as told by your health care provider. This is important. Contact a health care provider if:  You keep feeling nauseous or you keep vomiting.  You feel light-headed.  You develop a rash.  You have a fever. Get help right away if:  You have trouble breathing. This information is not intended to replace advice given to you by your health care provider. Make sure you discuss any questions you have with your health care provider. Document Released: 05/22/2013 Document Revised: 01/04/2016 Document Reviewed: 11/21/2015 Elsevier Interactive Patient Education  2018 [place]. Femoral Site Care Refer to this sheet in the next few weeks. These instructions provide you with information about caring for yourself after your  procedure. Your health care provider may also give you more specific instructions. Your treatment has been planned according to current medical practices, but problems sometimes occur. Call your health care provider if you have any problems or questions after your procedure. What can I expect after the procedure? After your procedure, it is typical to have the following:  Bruising at the site that usually fades within 1-2 weeks.  Blood collecting in the tissue (hematoma) that may be painful to the touch. It should usually decrease in size and tenderness within 1-2 weeks.  Follow these instructions at home:  Take medicines only as directed by your health care provider.  You may shower 24-48 hours after the procedure or as directed by your health care provider. Remove the bandage (dressing) and gently wash the site with plain soap and water. Pat the area dry with a clean towel. Do not rub the site, because this may cause bleeding.  Do not take baths, swim, or use a hot tub until your health care provider approves.  Check your insertion site every day for redness, swelling, or drainage.  Do not apply powder or lotion to the site.  Limit  use of stairs to twice a day for the first 2-3 days or as directed by your health care provider.  Do not squat for the first 2-3 days or as directed by your health care provider.  Do not lift over 10 lb (4.5 kg) for 5 days after your procedure or as directed by your health care provider.  Ask your health care provider when it is okay to: ? Return to work or school. ? Resume usual physical activities or sports. ? Resume sexual activity.  Do not drive home if you are discharged the same day as the procedure. Have someone else drive you.  You may drive 24 hours after the procedure unless otherwise instructed by your health care provider.  Do not operate machinery or power tools for 24 hours after the procedure or as directed by your health care  provider.  If your procedure was done as an outpatient procedure, which means that you went home the same day as your procedure, a responsible adult should be with you for the first 24 hours after you arrive home.  Keep all follow-up visits as directed by your health care provider. This is important. Contact a health care provider if:  You have a fever.  You have chills.  You have increased bleeding from the site. Hold pressure on the site. Get help right away if:  You have unusual pain at the site.  You have redness, warmth, or swelling at the site.  You have drainage (other than a small amount of blood on the dressing) from the site.  The site is bleeding, and the bleeding does not stop after 30 minutes of holding steady pressure on the site.  Your leg or foot becomes pale, cool, tingly, or numb. This information is not intended to replace advice given to you by your health care provider. Make sure you discuss any questions you have with your health care provider. Document Released: 04/04/2014 Document Revised: 01/07/2016 Document Reviewed: 02/18/2014 Elsevier Interactive Patient Education  [employer].

## 2017-04-25 NOTE — Op Note (Signed)
    Patient name: Anna Rowe MRN: [id number] DOB: 09-Jun-1976 Sex: female  04/25/2017 Pre-operative Diagnosis: History of DVT Post-operative diagnosis:  Same Surgeon:  [name] Procedure Performed:  1.  Ultrasound-guided access, right common femoral vein  2.  Inferior venacavogram  3.  Catheter in IVC  4.  Placement of a infrarenal IVC filter  5.  Conscious sedation    Indications:  The patient has a history of a DVT.  She is scheduled for orthopedic surgery.  A filter has been recommended.  Procedure:  The patient was identified in the holding area and taken to room 8.  The patient was then placed supine on the table and prepped and draped in the usual sterile fashion.  A time out was called.  Conscious sedation was administered with the use of IV fentanyl and Versed in a continuous physician and nurse monitoring.  Heart rate, blood pressure, and oxygen saturation were continuously monitored.  Ultrasound was used to evaluate the right common femoral vein which is widely patent.  One percent lidocaine was used for local anesthesia.  The right common femoral vein was then cannulated with an 18-gauge needle.  A 035 wire was advanced without resistance.  A Omni flush catheter was then placed in the inferior vena cava and inferior venacavogram was performed  Normal venous anatomy was identified.  The diameter of the IVC was 22 mm.  Next, the 035 wire was replaced.  The filter sheath was inserted.  A Cook Celect filter was then inserted and deployed with the tip at the lower aspect of L-1.  The sheath was removed and manual pressure was held for hemostasis   Impression:  #1  successful placement of a Cook Celect IVC filter    V. [name], M.D. [facility] [place] Office: [phone] Pager:  [phone]

## 2017-04-26 ENCOUNTER — Encounter (HOSPITAL_COMMUNITY): Payer: Self-pay | Admitting: Surgery

## 2017-04-26 ENCOUNTER — Telehealth: Payer: Self-pay | Admitting: Surgery

## 2017-04-26 NOTE — Telephone Encounter (Signed)
Sched appt 10/23/17; lab at 2:00 and MD at 2:45. Mailed appt letter.

## 2017-04-26 NOTE — Telephone Encounter (Signed)
-----   Message from Mena Goes, RN sent at 04/25/2017  4:17 PM EDT ----- Regarding: 6 months    ----- Message ----- From: Serafina Mitchell, MD Sent: 04/25/2017   3:57 PM To: Vvs Charge Pool  04-25-2017:   Surgeon:  [name] Procedure Performed:  1.  Ultrasound-guided access, right common femoral vein  2.  Inferior venacavogram  3.  Catheter in IVC  4.  Placement of a infrarenal IVC filter  5.  Conscious sedation  Scheduled for follow-up in 6 months with lower extremity venous ultrasound to rule out DVT.  She needs to see me

## 2017-04-28 ENCOUNTER — Encounter (HOSPITAL_COMMUNITY): Payer: Self-pay | Admitting: *Deleted

## 2017-04-28 ENCOUNTER — Inpatient Hospital Stay (HOSPITAL_COMMUNITY)
Admission: RE | Admit: 2017-04-28 | Discharge: 2017-04-30 | DRG: 470 | Disposition: A | Discharge status: Home / Self Care | Payer: 59 | Source: Ambulatory Visit | Attending: Orthopaedic Surgery | Admitting: Orthopaedic Surgery

## 2017-04-28 ENCOUNTER — Inpatient Hospital Stay (HOSPITAL_COMMUNITY): Payer: 59

## 2017-04-28 ENCOUNTER — Encounter (HOSPITAL_COMMUNITY)
Admission: RE | Disposition: A | Discharge status: Home / Self Care | Payer: Self-pay | Source: Ambulatory Visit | Attending: Orthopaedic Surgery

## 2017-04-28 ENCOUNTER — Inpatient Hospital Stay (HOSPITAL_COMMUNITY): Payer: 59 | Admitting: Registered Nurse

## 2017-04-28 DIAGNOSIS — Z7901 Long term (current) use of anticoagulants: Secondary | ICD-10-CM | POA: Diagnosis not present

## 2017-04-28 DIAGNOSIS — Z408 Encounter for other prophylactic surgery: Secondary | ICD-10-CM | POA: Diagnosis present

## 2017-04-28 DIAGNOSIS — F17210 Nicotine dependence, cigarettes, uncomplicated: Secondary | ICD-10-CM | POA: Diagnosis present

## 2017-04-28 DIAGNOSIS — Z91030 Bee allergy status: Secondary | ICD-10-CM

## 2017-04-28 DIAGNOSIS — Z96642 Presence of left artificial hip joint: Secondary | ICD-10-CM

## 2017-04-28 DIAGNOSIS — M1612 Unilateral primary osteoarthritis, left hip: Secondary | ICD-10-CM | POA: Diagnosis not present

## 2017-04-28 DIAGNOSIS — Z419 Encounter for procedure for purposes other than remedying health state, unspecified: Secondary | ICD-10-CM

## 2017-04-28 DIAGNOSIS — E785 Hyperlipidemia, unspecified: Secondary | ICD-10-CM | POA: Diagnosis present

## 2017-04-28 DIAGNOSIS — Z888 Allergy status to other drugs, medicaments and biological substances status: Secondary | ICD-10-CM

## 2017-04-28 DIAGNOSIS — G4730 Sleep apnea, unspecified: Secondary | ICD-10-CM | POA: Diagnosis present

## 2017-04-28 DIAGNOSIS — E119 Type 2 diabetes mellitus without complications: Secondary | ICD-10-CM | POA: Diagnosis present

## 2017-04-28 DIAGNOSIS — Z79899 Other long term (current) drug therapy: Secondary | ICD-10-CM

## 2017-04-28 DIAGNOSIS — Z96651 Presence of right artificial knee joint: Secondary | ICD-10-CM | POA: Diagnosis present

## 2017-04-28 DIAGNOSIS — Z6841 Body Mass Index (BMI) 40.0 and over, adult: Secondary | ICD-10-CM | POA: Diagnosis not present

## 2017-04-28 DIAGNOSIS — G43909 Migraine, unspecified, not intractable, without status migrainosus: Secondary | ICD-10-CM | POA: Diagnosis present

## 2017-04-28 DIAGNOSIS — Z86718 Personal history of other venous thrombosis and embolism: Secondary | ICD-10-CM

## 2017-04-28 DIAGNOSIS — Z79891 Long term (current) use of opiate analgesic: Secondary | ICD-10-CM | POA: Diagnosis not present

## 2017-04-28 DIAGNOSIS — Z794 Long term (current) use of insulin: Secondary | ICD-10-CM | POA: Diagnosis not present

## 2017-04-28 HISTORY — PX: TOTAL HIP ARTHROPLASTY: SHX124

## 2017-04-28 LAB — GLUCOSE, CAPILLARY
Glucose-Capillary: 139 mg/dL — ABNORMAL HIGH (ref 65–99)
Glucose-Capillary: 183 mg/dL — ABNORMAL HIGH (ref 65–99)
Glucose-Capillary: 190 mg/dL — ABNORMAL HIGH (ref 65–99)
Glucose-Capillary: 252 mg/dL — ABNORMAL HIGH (ref 65–99)

## 2017-04-28 LAB — SAMPLE TO BLOOD BANK

## 2017-04-28 SURGERY — ARTHROPLASTY, HIP, TOTAL, ANTERIOR APPROACH
Anesthesia: Regional | Site: Hip | Laterality: Left

## 2017-04-28 MED ORDER — ONDANSETRON HCL 4 MG PO TABS: 4.0000 mg | ORAL_TABLET | Freq: Four times a day (QID) | ORAL | Status: DC | PRN

## 2017-04-28 MED ORDER — MEPERIDINE HCL 50 MG/ML IJ SOLN: 6.2500 mg | INTRAMUSCULAR | Status: DC | PRN

## 2017-04-28 MED ORDER — ONDANSETRON HCL 4 MG/2ML IJ SOLN: 4.0000 mg | Freq: Once | INTRAMUSCULAR | Status: DC | PRN

## 2017-04-28 MED ORDER — KETOROLAC TROMETHAMINE 30 MG/ML IJ SOLN
INTRAMUSCULAR | Status: AC
  Filled 2017-04-28: qty 1

## 2017-04-28 MED ORDER — DEXMEDETOMIDINE HCL IN NACL 200 MCG/50ML IV SOLN
INTRAVENOUS | Status: AC
  Filled 2017-04-28: qty 50

## 2017-04-28 MED ORDER — ROSUVASTATIN CALCIUM 10 MG PO TABS
10.0000 mg | ORAL_TABLET | Freq: Every day | ORAL | Status: DC
  Administered 2017-04-28 – 2017-04-29 (×2): 10 mg via ORAL
  Filled 2017-04-28 (×2): qty 1

## 2017-04-28 MED ORDER — METOCLOPRAMIDE HCL 5 MG PO TABS: 5.0000 mg | ORAL_TABLET | Freq: Three times a day (TID) | ORAL | Status: DC | PRN

## 2017-04-28 MED ORDER — VITAMIN D 1000 UNITS PO TABS
2000.0000 [IU] | ORAL_TABLET | Freq: Every day | ORAL | Status: DC
  Administered 2017-04-28 – 2017-04-30 (×3): 2000 [IU] via ORAL
  Filled 2017-04-28 (×3): qty 2

## 2017-04-28 MED ORDER — DOCUSATE SODIUM 100 MG PO CAPS
100.0000 mg | ORAL_CAPSULE | Freq: Two times a day (BID) | ORAL | Status: DC
  Administered 2017-04-28 – 2017-04-30 (×4): 100 mg via ORAL
  Filled 2017-04-28 (×4): qty 1

## 2017-04-28 MED ORDER — ACETAMINOPHEN 10 MG/ML IV SOLN
INTRAVENOUS | Status: AC
Start: 2017-04-28 — End: 2017-04-28
  Filled 2017-04-28: qty 100

## 2017-04-28 MED ORDER — MENTHOL 3 MG MT LOZG: 1.0000 | LOZENGE | OROMUCOSAL | Status: DC | PRN

## 2017-04-28 MED ORDER — VARENICLINE TARTRATE 1 MG PO TABS
1.0000 mg | ORAL_TABLET | Freq: Two times a day (BID) | ORAL | Status: DC
  Administered 2017-04-28 – 2017-04-30 (×4): 1 mg via ORAL
  Filled 2017-04-28 (×5): qty 1

## 2017-04-28 MED ORDER — GABAPENTIN 300 MG PO CAPS
600.0000 mg | ORAL_CAPSULE | Freq: Three times a day (TID) | ORAL | Status: DC
  Administered 2017-04-28 – 2017-04-30 (×5): 600 mg via ORAL
  Filled 2017-04-28 (×5): qty 2

## 2017-04-28 MED ORDER — FENTANYL CITRATE (PF) 100 MCG/2ML IJ SOLN
INTRAMUSCULAR | Status: AC
  Filled 2017-04-28: qty 2

## 2017-04-28 MED ORDER — RIVAROXABAN 20 MG PO TABS
20.0000 mg | ORAL_TABLET | Freq: Every day | ORAL | Status: DC
  Administered 2017-04-29 – 2017-04-30 (×2): 20 mg via ORAL
  Filled 2017-04-28 (×2): qty 1

## 2017-04-28 MED ORDER — KETAMINE HCL 10 MG/ML IJ SOLN
INTRAMUSCULAR | Status: DC | PRN
  Administered 2017-04-28 (×2): 50 mg via INTRAVENOUS

## 2017-04-28 MED ORDER — CICLOPIROX 8 % EX SOLN: 1.0000 "application " | Freq: Two times a day (BID) | CUTANEOUS | Status: DC

## 2017-04-28 MED ORDER — PHENYLEPHRINE 40 MCG/ML (10ML) SYRINGE FOR IV PUSH (FOR BLOOD PRESSURE SUPPORT)
PREFILLED_SYRINGE | INTRAVENOUS | Status: DC | PRN
  Administered 2017-04-28 (×2): 80 ug via INTRAVENOUS

## 2017-04-28 MED ORDER — FENTANYL CITRATE (PF) 100 MCG/2ML IJ SOLN
25.0000 ug | INTRAMUSCULAR | Status: DC | PRN
  Administered 2017-04-28 (×4): 50 ug via INTRAVENOUS

## 2017-04-28 MED ORDER — MIDAZOLAM HCL 2 MG/2ML IJ SOLN
INTRAMUSCULAR | Status: AC
  Filled 2017-04-28: qty 2

## 2017-04-28 MED ORDER — CYCLOBENZAPRINE HCL 10 MG PO TABS
10.0000 mg | ORAL_TABLET | Freq: Three times a day (TID) | ORAL | Status: DC
  Administered 2017-04-28 – 2017-04-30 (×6): 10 mg via ORAL
  Filled 2017-04-28 (×5): qty 1

## 2017-04-28 MED ORDER — OXYCODONE HCL 5 MG PO TABS
5.0000 mg | ORAL_TABLET | ORAL | Status: DC | PRN
  Administered 2017-04-28 – 2017-04-30 (×12): 15 mg via ORAL
  Filled 2017-04-28 (×12): qty 3

## 2017-04-28 MED ORDER — DEXMEDETOMIDINE HCL 200 MCG/2ML IV SOLN
26.0000 ug | Freq: Once | INTRAVENOUS | Status: AC
  Administered 2017-04-28: 26 ug via INTRAVENOUS

## 2017-04-28 MED ORDER — FENTANYL CITRATE (PF) 100 MCG/2ML IJ SOLN
INTRAMUSCULAR | Status: DC | PRN
  Administered 2017-04-28: 100 ug via INTRAVENOUS
  Administered 2017-04-28 (×2): 50 ug via INTRAVENOUS

## 2017-04-28 MED ORDER — FOLIC ACID 1 MG PO TABS
1.0000 mg | ORAL_TABLET | Freq: Every day | ORAL | Status: DC
  Administered 2017-04-28 – 2017-04-30 (×3): 1 mg via ORAL
  Filled 2017-04-28 (×3): qty 1

## 2017-04-28 MED ORDER — ALUM & MAG HYDROXIDE-SIMETH 200-200-20 MG/5ML PO SUSP: 30.0000 mL | ORAL | Status: DC | PRN

## 2017-04-28 MED ORDER — SODIUM CHLORIDE 0.9 % IR SOLN
Status: DC | PRN
  Administered 2017-04-28: 1000 mL

## 2017-04-28 MED ORDER — CYCLOBENZAPRINE HCL 10 MG PO TABS
10.0000 mg | ORAL_TABLET | Freq: Three times a day (TID) | ORAL | Status: DC
  Filled 2017-04-28: qty 1

## 2017-04-28 MED ORDER — ACETAMINOPHEN 10 MG/ML IV SOLN
INTRAVENOUS | Status: DC | PRN
  Administered 2017-04-28: 1000 mg via INTRAVENOUS

## 2017-04-28 MED ORDER — ADULT MULTIVITAMIN W/MINERALS CH
1.0000 | ORAL_TABLET | Freq: Every day | ORAL | Status: DC
  Administered 2017-04-29 – 2017-04-30 (×2): 1 via ORAL
  Filled 2017-04-28 (×2): qty 1

## 2017-04-28 MED ORDER — HYDROMORPHONE HCL-NACL 0.5-0.9 MG/ML-% IV SOSY
1.0000 mg | PREFILLED_SYRINGE | INTRAVENOUS | Status: DC | PRN
  Administered 2017-04-28: 1 mg via INTRAVENOUS
  Administered 2017-04-28 – 2017-04-29 (×3): 2 mg via INTRAVENOUS
  Filled 2017-04-28 (×3): qty 4
  Filled 2017-04-28: qty 2

## 2017-04-28 MED ORDER — STERILE WATER FOR IRRIGATION IR SOLN
Status: DC | PRN
  Administered 2017-04-28: 1000 mL

## 2017-04-28 MED ORDER — INSULIN ASPART 100 UNIT/ML ~~LOC~~ SOLN
0.0000 [IU] | Freq: Three times a day (TID) | SUBCUTANEOUS | Status: DC
  Administered 2017-04-28 – 2017-04-30 (×4): 4 [IU] via SUBCUTANEOUS

## 2017-04-28 MED ORDER — PHENOL 1.4 % MT LIQD: 1.0000 | OROMUCOSAL | Status: DC | PRN

## 2017-04-28 MED ORDER — HYDROMORPHONE HCL 2 MG/ML IJ SOLN
INTRAMUSCULAR | Status: AC
  Filled 2017-04-28: qty 1

## 2017-04-28 MED ORDER — OXYCODONE HCL ER 20 MG PO T12A
20.0000 mg | EXTENDED_RELEASE_TABLET | Freq: Two times a day (BID) | ORAL | Status: DC
  Administered 2017-04-28 – 2017-04-30 (×5): 20 mg via ORAL
  Filled 2017-04-28 (×5): qty 1

## 2017-04-28 MED ORDER — DEXAMETHASONE SODIUM PHOSPHATE 10 MG/ML IJ SOLN
INTRAMUSCULAR | Status: DC | PRN
  Administered 2017-04-28: 10 mg via INTRAVENOUS

## 2017-04-28 MED ORDER — SODIUM CHLORIDE 0.9 % IV SOLN
INTRAVENOUS | Status: DC
  Administered 2017-04-28: 16:00:00 via INTRAVENOUS

## 2017-04-28 MED ORDER — DIPHENHYDRAMINE HCL 12.5 MG/5ML PO ELIX: 12.5000 mg | ORAL_SOLUTION | ORAL | Status: DC | PRN

## 2017-04-28 MED ORDER — LIDOCAINE 2% (20 MG/ML) 5 ML SYRINGE
INTRAMUSCULAR | Status: DC | PRN
  Administered 2017-04-28: 100 mg via INTRAVENOUS

## 2017-04-28 MED ORDER — LACTATED RINGERS IV SOLN
INTRAVENOUS | Status: DC
  Administered 2017-04-28: 11:00:00 via INTRAVENOUS

## 2017-04-28 MED ORDER — POLYETHYLENE GLYCOL 3350 17 G PO PACK: 17.0000 g | PACK | Freq: Every day | ORAL | Status: DC | PRN

## 2017-04-28 MED ORDER — METFORMIN HCL ER 500 MG PO TB24
500.0000 mg | ORAL_TABLET | Freq: Three times a day (TID) | ORAL | Status: DC
  Administered 2017-04-28 – 2017-04-30 (×6): 500 mg via ORAL
  Filled 2017-04-28 (×6): qty 1

## 2017-04-28 MED ORDER — KETAMINE HCL 10 MG/ML IJ SOLN
INTRAMUSCULAR | Status: AC
  Filled 2017-04-28: qty 1

## 2017-04-28 MED ORDER — KETOROLAC TROMETHAMINE 30 MG/ML IJ SOLN: 30.0000 mg | Freq: Once | INTRAMUSCULAR | Status: DC

## 2017-04-28 MED ORDER — AMITRIPTYLINE HCL 50 MG PO TABS
50.0000 mg | ORAL_TABLET | Freq: Every day | ORAL | Status: DC
  Administered 2017-04-28 – 2017-04-29 (×2): 50 mg via ORAL
  Filled 2017-04-28 (×2): qty 1

## 2017-04-28 MED ORDER — DEXMEDETOMIDINE HCL 200 MCG/2ML IV SOLN
INTRAVENOUS | Status: DC | PRN
  Administered 2017-04-28: 16 ug via INTRAVENOUS
  Administered 2017-04-28 (×5): 8 ug via INTRAVENOUS

## 2017-04-28 MED ORDER — METOCLOPRAMIDE HCL 5 MG/ML IJ SOLN: 5.0000 mg | Freq: Three times a day (TID) | INTRAMUSCULAR | Status: DC | PRN

## 2017-04-28 MED ORDER — INSULIN ASPART 100 UNIT/ML ~~LOC~~ SOLN
0.0000 [IU] | Freq: Every day | SUBCUTANEOUS | Status: DC
  Administered 2017-04-28: 3 [IU] via SUBCUTANEOUS

## 2017-04-28 MED ORDER — HYDROMORPHONE HCL 1 MG/ML IJ SOLN
INTRAMUSCULAR | Status: DC | PRN
  Administered 2017-04-28 (×4): 0.5 mg via INTRAVENOUS

## 2017-04-28 MED ORDER — ACETAMINOPHEN 650 MG RE SUPP: 650.0000 mg | Freq: Four times a day (QID) | RECTAL | Status: DC | PRN

## 2017-04-28 MED ORDER — DEXTROSE 5 % IV SOLN
3.0000 g | Freq: Once | INTRAVENOUS | Status: AC
  Administered 2017-04-28: 3 g via INTRAVENOUS
  Filled 2017-04-28: qty 3

## 2017-04-28 MED ORDER — ACETAMINOPHEN 325 MG PO TABS
650.0000 mg | ORAL_TABLET | Freq: Four times a day (QID) | ORAL | Status: DC | PRN
  Administered 2017-04-28: 650 mg via ORAL
  Filled 2017-04-28: qty 2

## 2017-04-28 MED ORDER — FENTANYL CITRATE (PF) 100 MCG/2ML IJ SOLN
INTRAMUSCULAR | Status: AC
  Filled 2017-04-28: qty 4

## 2017-04-28 MED ORDER — CEFAZOLIN SODIUM-DEXTROSE 2-4 GM/100ML-% IV SOLN
2.0000 g | Freq: Four times a day (QID) | INTRAVENOUS | Status: AC
  Administered 2017-04-28 – 2017-04-29 (×2): 2 g via INTRAVENOUS
  Filled 2017-04-28 (×2): qty 100

## 2017-04-28 MED ORDER — MIDAZOLAM HCL 5 MG/5ML IJ SOLN
INTRAMUSCULAR | Status: DC | PRN
  Administered 2017-04-28: 2 mg via INTRAVENOUS

## 2017-04-28 MED ORDER — ONDANSETRON HCL 4 MG/2ML IJ SOLN: 4.0000 mg | Freq: Four times a day (QID) | INTRAMUSCULAR | Status: DC | PRN

## 2017-04-28 MED ORDER — PROPOFOL 10 MG/ML IV BOLUS
INTRAVENOUS | Status: DC | PRN
  Administered 2017-04-28: 250 mg via INTRAVENOUS

## 2017-04-28 MED ORDER — CHLORHEXIDINE GLUCONATE 4 % EX LIQD: 60.0000 mL | Freq: Once | CUTANEOUS | Status: DC

## 2017-04-28 MED ORDER — PROPOFOL 10 MG/ML IV BOLUS
INTRAVENOUS | Status: AC
  Filled 2017-04-28: qty 40

## 2017-04-28 MED ORDER — ONDANSETRON HCL 4 MG/2ML IJ SOLN
INTRAMUSCULAR | Status: DC | PRN
  Administered 2017-04-28: 4 mg via INTRAVENOUS

## 2017-04-28 SURGICAL SUPPLY — 34 items
BAG ZIPLOCK 12X15 (MISCELLANEOUS) IMPLANT
BENZOIN TINCTURE PRP APPL 2/3 (GAUZE/BANDAGES/DRESSINGS) IMPLANT
BLADE SAW SGTL 18X1.27X75 (BLADE) ×2 IMPLANT
CAPT HIP TOTAL 2 ×2 IMPLANT
CELLS DAT CNTRL 66122 CELL SVR (MISCELLANEOUS) ×1 IMPLANT
COVER PERINEAL POST (MISCELLANEOUS) ×2 IMPLANT
COVER SURGICAL LIGHT HANDLE (MISCELLANEOUS) ×2 IMPLANT
DRAPE STERI IOBAN 125X83 (DRAPES) ×2 IMPLANT
DRAPE U-SHAPE 47X51 STRL (DRAPES) ×4 IMPLANT
DRSG AQUACEL AG ADV 3.5X10 (GAUZE/BANDAGES/DRESSINGS) ×2 IMPLANT
DURAPREP 26ML APPLICATOR (WOUND CARE) ×2 IMPLANT
ELECT REM PT RETURN 15FT ADLT (MISCELLANEOUS) ×2 IMPLANT
GAUZE XEROFORM 1X8 LF (GAUZE/BANDAGES/DRESSINGS) ×2 IMPLANT
GLOVE BIO SURGEON STRL SZ7.5 (GLOVE) ×10 IMPLANT
GLOVE BIOGEL PI IND STRL 8 (GLOVE) ×2 IMPLANT
GLOVE BIOGEL PI INDICATOR 8 (GLOVE) ×2
GLOVE ECLIPSE 8.0 STRL XLNG CF (GLOVE) ×2 IMPLANT
GOWN STRL REUS W/TWL XL LVL3 (GOWN DISPOSABLE) ×8 IMPLANT
HANDPIECE INTERPULSE COAX TIP (DISPOSABLE) ×1
HEAD CERAMIC 36 PLUS5 (Hips) IMPLANT
HOLDER FOLEY CATH W/STRAP (MISCELLANEOUS) ×2 IMPLANT
PACK ANTERIOR HIP CUSTOM (KITS) ×2 IMPLANT
RTRCTR WOUND ALEXIS 18CM MED (MISCELLANEOUS) ×2
SET HNDPC FAN SPRY TIP SCT (DISPOSABLE) ×1 IMPLANT
STAPLER VISISTAT 35W (STAPLE) ×2 IMPLANT
STRIP CLOSURE SKIN 1/2X4 (GAUZE/BANDAGES/DRESSINGS) IMPLANT
SUT ETHIBOND NAB CT1 #1 30IN (SUTURE) ×2 IMPLANT
SUT MNCRL AB 4-0 PS2 18 (SUTURE) IMPLANT
SUT VIC AB 0 CT1 36 (SUTURE) ×2 IMPLANT
SUT VIC AB 1 CT1 36 (SUTURE) ×2 IMPLANT
SUT VIC AB 2-0 CT1 27 (SUTURE) ×2
SUT VIC AB 2-0 CT1 TAPERPNT 27 (SUTURE) ×2 IMPLANT
TRAY FOLEY CATH 14FR (SET/KITS/TRAYS/PACK) ×2 IMPLANT
YANKAUER SUCT BULB TIP 10FT TU (MISCELLANEOUS) ×2 IMPLANT

## 2017-04-28 NOTE — Transfer of Care (Signed)
Immediate Anesthesia Transfer of Care Note  Patient: Anna Rowe  Procedure(s) Performed: Procedure(s): LEFT TOTAL HIP ARTHROPLASTY ANTERIOR APPROACH (Left)  Patient Location: PACU  Anesthesia Type:General  Level of Consciousness: awake, alert , oriented and patient cooperative  Airway & Oxygen Therapy: Patient Spontanous Breathing and Patient connected to face mask oxygen  Post-op Assessment: Report given to RN, Post -op Vital signs reviewed and stable and Patient moving all extremities  Post vital signs: Reviewed and stable  Last Vitals:  Vitals:   04/28/17 1028  BP: (!) 138/92  Pulse: (!) 111  Resp: 18  Temp: 37.2 C  SpO2: 98%    Last Pain:  Vitals:   04/28/17 1053  TempSrc:   PainSc: 3          Complications: No apparent anesthesia complications

## 2017-04-28 NOTE — Brief Op Note (Signed)
04/28/2017  1:56 PM  PATIENT:  Anna Rowe  41 y.o. female  PRE-OPERATIVE DIAGNOSIS:  osteoarthritis left hip  POST-OPERATIVE DIAGNOSIS:  osteoarthritis left hip  PROCEDURE:  Procedure(s): LEFT TOTAL HIP ARTHROPLASTY ANTERIOR APPROACH (Left)  SURGEON:  Surgeon(s) and Role:    Mcarthur Rossetti, MD - Primary  PHYSICIAN ASSISTANT: Benita Stabile, PA-C  ANESTHESIA:   general  EBL:  Total I/O In: 1000 [I.V.:1000] Out: 500 [Urine:200; Blood:300]  COUNTS:  YES  DICTATION: .Other Dictation: Dictation Number [id number]  PLAN OF CARE: Admit to inpatient   PATIENT DISPOSITION:  PACU - hemodynamically stable.   Delay start of Pharmacological VTE agent (>24hrs) due to surgical blood loss or risk of bleeding: no

## 2017-04-28 NOTE — Anesthesia Preprocedure Evaluation (Addendum)
Anesthesia Evaluation  Patient identified by MRN, date of birth, ID band Patient awake    Reviewed: Allergy & Precautions, H&P , NPO status , Patient's Chart, lab work & pertinent test results  Airway Mallampati: II  TM Distance: >3 FB Neck ROM: Full    Dental no notable dental hx. (+) Teeth Intact, Dental Advisory Given   Pulmonary sleep apnea , Current Smoker,    Pulmonary exam normal breath sounds clear to auscultation       Cardiovascular negative cardio ROS   Rhythm:Regular Rate:Normal     Neuro/Psych  Headaches, negative psych ROS   GI/Hepatic negative GI ROS, Neg liver ROS,   Endo/Other  negative endocrine ROSdiabetesMorbid obesity  Renal/GU negative Renal ROS  negative genitourinary   Musculoskeletal   Abdominal   Peds  Hematology negative hematology ROS (+)   Anesthesia Other Findings   Reproductive/Obstetrics negative OB ROS                             Anesthesia Physical  Anesthesia Plan  ASA: II  Anesthesia Plan: General   Post-op Pain Management:    Induction: Intravenous  PONV Risk Score and Plan: 2 and Ondansetron, Dexamethasone, Treatment may vary due to age or medical condition and Midazolam  Airway Management Planned: LMA  Additional Equipment:   Intra-op Plan:   Post-operative Plan: Extubation in OR  Informed Consent: I have reviewed the patients History and Physical, chart, labs and discussed the procedure including the risks, benefits and alternatives for the proposed anesthesia with the patient or authorized representative who has indicated his/her understanding and acceptance.   Dental advisory given  Plan Discussed with: CRNA  Anesthesia Plan Comments:        Anesthesia Quick Evaluation

## 2017-04-28 NOTE — Anesthesia Procedure Notes (Signed)
Procedure Name: LMA Insertion Date/Time: 04/28/2017 11:55 AM Performed by: Carleene Cooper A Pre-anesthesia Checklist: Patient identified, Emergency Drugs available, Suction available, Patient being monitored and Timeout performed Patient Re-evaluated:Patient Re-evaluated prior to induction Oxygen Delivery Method: Circle system utilized Preoxygenation: Pre-oxygenation with 100% oxygen Induction Type: IV induction LMA: LMA with gastric port inserted LMA Size: 4.0 Number of attempts: 1 Placement Confirmation: positive ETCO2 and breath sounds checked- equal and bilateral Tube secured with: Tape Dental Injury: Teeth and Oropharynx as per pre-operative assessment

## 2017-04-28 NOTE — H&P (Signed)
TOTAL HIP ADMISSION H&P  Patient is admitted for left total hip arthroplasty.  Subjective:  Chief Complaint: left hip pain  HPI: Anna Rowe, 41 y.o. female, has a history of pain and functional disability in the left hip(s) due to arthritis and patient has failed non-surgical conservative treatments for greater than 12 weeks to include NSAID's and/or analgesics, corticosteriod injections, flexibility and strengthening excercises, use of assistive devices, weight reduction as appropriate and activity modification.  Onset of symptoms was gradual starting 2 years ago with gradually worsening course since that time.The patient noted no past surgery on the left hip(s).  Patient currently rates pain in the left hip at 10 out of 10 with activity. Patient has night pain, worsening of pain with activity and weight bearing, trendelenberg gait, pain that interfers with activities of daily living, pain with passive range of motion and crepitus. Patient has evidence of subchondral cysts, subchondral sclerosis, periarticular osteophytes and joint space narrowing by imaging studies. This condition presents safety issues increasing the risk of falls.  There is no current active infection.  Patient Active Problem List   Diagnosis Date Noted  . Unilateral primary osteoarthritis, left hip 03/09/2017  . Unilateral primary osteoarthritis, right hip 03/09/2017  . Primary osteoarthritis of right knee 04/04/2016   Past Medical History:  Diagnosis Date  . Diabetes mellitus ([place])    type 2   . DVT (deep venous thrombosis) ([place]) dx 10-2016   RLE   . HLD (hyperlipidemia)    on [place]  . Migraines   . Sleep apnea    per patient ; never been dx does not use CPAP     Past Surgical History:  Procedure Laterality Date  . ACHILLES TENDON REPAIR    . HERNIA REPAIR     x2; hiatal and umbilical   . IVC FILTER INSERTION N/A 04/25/2017   Procedure: IVC Filter Insertion;  Surgeon: Serafina Mitchell, MD;   Location: [facility];  Service: Cardiovascular;  Laterality: N/A;  . KNEE ARTHROSCOPY Right 2015  . plantar    . TOTAL KNEE ARTHROPLASTY Right 04/04/2016   Procedure: TOTAL KNEE ARTHROPLASTY;  Surgeon: Dorna Leitz, MD;  Location: [facility];  Service: Orthopedics;  Laterality: Right;    Prescriptions Prior to Admission  Medication Sig Dispense Refill Last Dose  . amitriptyline (ELAVIL) 50 MG tablet Take 50 mg by mouth at bedtime.  2 04/27/2017 at 1800  . CHANTIX CONTINUING MONTH PAK 1 MG tablet Take 1 mg by mouth 2 (two) times daily.  4 04/27/2017 at 1800  . Cholecalciferol (EQL VITAMIN D3) 2000 units CAPS Take 2,000 Units by mouth daily.   04/24/2017 at 0600  . ciclopirox (PENLAC) 8 % solution Apply 1 application topically 2 (two) times daily.   6 04/24/2017 at 0600  . clotrimazole-betamethasone (LOTRISONE) cream Apply 1 application topically 2 (two) times daily.  3 04/24/2017 at 0600  . cyclobenzaprine (FLEXERIL) 10 MG tablet Take 10 mg by mouth 3 (three) times daily.  1 04/25/2017 at 0600  . docusate sodium (COLACE) 100 MG capsule Take 100 mg by mouth 2 (two) times daily.   6 04/27/2017 at 1800  . folic acid (FOLVITE) 1 MG tablet Take 1 mg by mouth daily.  5 04/24/2017 at 0600  . gabapentin (NEURONTIN) 600 MG tablet Take 600 mg by mouth 3 (three) times daily.  3 04/28/2017 at 0600  . insulin regular (NOVOLIN R,HUMULIN R) 100 units/mL injection Inject 5-10 Units into the skin daily as needed  for high blood sugar (depends on blood sugar readings if takes  5-10 units).    04/27/2017 at 1000  . medroxyPROGESTERone (DEPO-PROVERA) 150 MG/ML injection Inject 150 mg into the muscle every 3 (three) months. Last dose August 2018  3 04/06/2017  . meloxicam (MOBIC) 15 MG tablet Take 15 mg by mouth daily.  1 04/25/2017 at 0600  . metFORMIN (GLUCOPHAGE-XR) 500 MG 24 hr tablet Take 500 mg by mouth 3 (three) times daily with meals.   3 04/24/2017 at 0600  . Multiple Vitamin (MULTIVITAMIN WITH MINERALS) TABS tablet Take  1 tablet by mouth daily.   04/24/2017 at 0600  . Oxycodone HCl 10 MG TABS Take 10 mg by mouth every 8 (eight) hours. for pain  0 04/28/2017 at 0600  . OXYCONTIN 10 MG 12 hr tablet Take 10 mg by mouth every 12 (twelve) hours.  0 04/27/2017 at 1800  . polyethylene glycol powder (GLYCOLAX/MIRALAX) powder Take 17 g by mouth See admin instructions. Takes every 2 weeks for 3 days straight to prevent constipation  6 Past Month at Unknown time  . rosuvastatin (CRESTOR) 10 MG tablet Take 10 mg by mouth at bedtime.  5 04/24/2017 at 0600  . XARELTO 20 MG TABS tablet Take 20 mg by mouth daily.  0 04/25/2017 at 0600  . ONETOUCH VERIO test strip 3 (three) times daily. for testing  5 Taking   Allergies  Allergen Reactions  . Methocarbamol Anaphylaxis and Swelling  . Tramadol Anaphylaxis    Tolerates Dilaudid, Oxycontin 04/04/16  . Bee Venom Hives    Social History  Substance Use Topics  . Smoking status: Current Every Day Smoker    Packs/day: 0.50    Years: 17.00    Types: Cigarettes  . Smokeless tobacco: Never Used     Comment: 3 ioose cigarettes in last week  . Alcohol use No     Comment: seldom     History reviewed. No pertinent family history.   Review of Systems  Musculoskeletal: Positive for joint pain.  All other systems reviewed and are negative.   Objective:  Physical Exam  Constitutional: She is oriented to person, place, and time. She appears well-developed and well-nourished.  HENT:  Head: Normocephalic and atraumatic.  Eyes: Pupils are equal, round, and reactive to light. EOM are normal.  Neck: Normal range of motion. Neck supple.  Cardiovascular: Normal rate and regular rhythm.   Respiratory: Effort normal and breath sounds normal.  GI: Soft. Bowel sounds are normal.  Musculoskeletal:       Left hip: She exhibits decreased range of motion, decreased strength, tenderness and bony tenderness.  Neurological: She is alert and oriented to person, place, and time.  Skin: Skin is  warm and dry.  Psychiatric: She has a normal mood and affect.    Vital signs in last 24 hours: Temp:  [98.9 F (37.2 C)] 98.9 F (37.2 C) (09/14 1028) Pulse Rate:  [111] 111 (09/14 1028) Resp:  [18] 18 (09/14 1028) BP: (138)/(92) 138/92 (09/14 1028) SpO2:  [98 %] 98 % (09/14 1028) Weight:  [302 lb (137 kg)] 302 lb (137 kg) (09/14 1053)  Labs:   Estimated body mass index is 40.96 kg/m as calculated from the following:   Height as of this encounter: 6' (1.829 m).   Weight as of this encounter: 302 lb (137 kg).   Imaging Review Plain radiographs demonstrate severe degenerative joint disease of the left hip(s). The bone quality appears to be good for age and  reported activity level.  Assessment/Plan:  End stage arthritis, left hip(s)  The patient history, physical examination, clinical judgement of the provider and imaging studies are consistent with end stage degenerative joint disease of the left hip(s) and total hip arthroplasty is deemed medically necessary. The treatment options including medical management, injection therapy, arthroscopy and arthroplasty were discussed at length. The risks and benefits of total hip arthroplasty were presented and reviewed. The risks due to aseptic loosening, infection, stiffness, dislocation/subluxation,  thromboembolic complications and other imponderables were discussed.  The patient acknowledged the explanation, agreed to proceed with the plan and consent was signed. Patient is being admitted for inpatient treatment for surgery, pain control, PT, OT, prophylactic antibiotics, VTE prophylaxis, progressive ambulation and ADL's and discharge planning.The patient is planning to be discharged home with home health services

## 2017-04-28 NOTE — Anesthesia Postprocedure Evaluation (Signed)
Anesthesia Post Note  Patient: Christie Beckers [facility]  Procedure(s) Performed: Procedure(s) (LRB): LEFT TOTAL HIP ARTHROPLASTY ANTERIOR APPROACH (Left)     Patient location during evaluation: PACU Anesthesia Type: Regional Level of consciousness: oriented and awake and alert Pain management: pain level controlled Vital Signs Assessment: post-procedure vital signs reviewed and stable Respiratory status: spontaneous breathing, respiratory function stable and patient connected to nasal cannula oxygen Cardiovascular status: blood pressure returned to baseline and stable Postop Assessment: no headache, no backache and no apparent nausea or vomiting Anesthetic complications: no    Last Vitals:  Vitals:   04/28/17 1430 04/28/17 1445  BP: (!) 139/95 (!) 130/93  Pulse: (!) 109 (!) 108  Resp: 16 19  Temp:    SpO2: 98% 99%    Last Pain:  Vitals:   04/28/17 1445  TempSrc:   PainSc: 10-Worst pain ever                 Monigue Spraggins

## 2017-04-29 LAB — BASIC METABOLIC PANEL
Anion gap: 8 (ref 5–15)
BUN: 12 mg/dL (ref 6–20)
CO2: 22 mmol/L (ref 22–32)
Calcium: 8.4 mg/dL — ABNORMAL LOW (ref 8.9–10.3)
Chloride: 107 mmol/L (ref 101–111)
Creatinine, Ser: 0.79 mg/dL (ref 0.44–1.00)
GFR calc Af Amer: >60 mL/min (ref >60)
GFR calc non Af Amer: >60 mL/min (ref >60)
Glucose, Bld: 186 mg/dL — ABNORMAL HIGH (ref 65–99)
Potassium: 4.1 mmol/L (ref 3.5–5.1)
Sodium: 137 mmol/L (ref 135–145)

## 2017-04-29 LAB — CBC
HCT: 32.1 % — ABNORMAL LOW (ref 36.0–46.0)
Hemoglobin: 10.6 g/dL — ABNORMAL LOW (ref 12.0–15.0)
MCH: 23 pg — ABNORMAL LOW (ref 26.0–34.0)
MCHC: 33 g/dL (ref 30.0–36.0)
MCV: 69.8 fL — ABNORMAL LOW (ref 78.0–100.0)
Platelets: 303 10*3/uL (ref 150–400)
RBC: 4.6 MIL/uL (ref 3.87–5.11)
RDW: 17.1 % — ABNORMAL HIGH (ref 11.5–15.5)
WBC: 10 10*3/uL (ref 4.0–10.5)

## 2017-04-29 LAB — GLUCOSE, CAPILLARY
Glucose-Capillary: 172 mg/dL — ABNORMAL HIGH (ref 65–99)
Glucose-Capillary: 103 mg/dL — ABNORMAL HIGH (ref 65–99)
Glucose-Capillary: 137 mg/dL — ABNORMAL HIGH (ref 65–99)

## 2017-04-29 NOTE — Evaluation (Addendum)
Physical Therapy Evaluation Patient Details Name: Anna Rowe MRN: [id number] DOB: 07-25-1976 Today's Date: 04/29/2017   History of Present Illness  s/p L THA; PMHx: R TKA  Clinical Impression  Pt is s/p THA resulting in the deficits listed below (see PT Problem List).  Pt will benefit from skilled PT to increase their independence and safety with mobility to allow discharge to the venue listed below.  Pt amb 47' with RW and min assist to supervision; pt should continue to progress well, has supportive family     Follow Up Recommendations Home health PT    Equipment Recommendations  None recommended by PT    Recommendations for Other Services       Precautions / Restrictions Precautions Precautions: Fall Required Braces or Orthoses: Other Brace/Splint Other Brace/Splint: wears R knee brace for amb Restrictions Weight Bearing Restrictions: No      Mobility  Bed Mobility Overal bed mobility: Needs Assistance Bed Mobility: Rolling Rolling: Min assist   Supine to sit: Min assist     General bed mobility comments: assist wtih LLE  Transfers Overall transfer level: Needs assistance Equipment used: Rolling walker (2 wheeled) Transfers: Sit to/from Stand Sit to Stand: Min assist;From elevated surface         General transfer comment: cues for hand placement   Ambulation/Gait Ambulation/Gait assistance: Min guard;Supervision Ambulation Distance (Feet): 80 Feet Assistive device: Rolling walker (2 wheeled) Gait Pattern/deviations: Step-to pattern;Antalgic     General Gait Details: cues for sequence, posture  Stairs            Wheelchair Mobility    Modified Rankin (Stroke Patients Only)       Balance                                             Pertinent Vitals/Pain Pain Assessment: 0-10 Pain Score: 3  Pain Location: left hip Pain Descriptors / Indicators: Aching Pain Intervention(s): Limited activity within  patient's tolerance;Monitored during session;Ice applied    Home Living Family/patient expects to be discharged to:: Private residence Living Arrangements: Spouse/significant other Available Help at Discharge: Family Type of Home: House Home Access: Stairs to enter Entrance Stairs-Rails: Right Entrance Stairs-Number of Steps: [place]: Environmental consultant - 2 wheels;Bedside commode      Prior Function Level of Independence: Independent, mod I --pt reports she recently began using lofstrand crutches at home using, she is mostly a household ambulator                Hand Dominance        Extremity/Trunk Assessment   Upper Extremity Assessment Upper Extremity Assessment: Overall WFL for tasks assessed    Lower Extremity Assessment Lower Extremity Assessment: LLE deficits/detail LLE Deficits / Details: ankle WFL; knee and hip grossly 2+/5, limited by post op pain and weakness       Communication   Communication: No difficulties  Cognition Arousal/Alertness: Awake/alert Behavior During Therapy: WFL for tasks assessed/performed Overall Cognitive Status: Within Functional Limits for tasks assessed                                        General Comments      Exercises Total Joint Exercises Ankle Circles/Pumps: AROM;Both;10 reps   Assessment/Plan    PT  Assessment Patient needs continued PT services  PT Problem List Decreased strength;Decreased mobility;Decreased activity tolerance;Decreased knowledge of use of DME;Pain       PT Treatment Interventions DME instruction;Gait training;Therapeutic activities;Therapeutic exercise;Patient/family education;Functional mobility training;Stair training    PT Goals (Current goals can be found in the Care Plan section)  Acute Rehab PT Goals Patient Stated Goal: have less pain PT Goal Formulation: With patient Time For Goal Achievement: 05/03/17 Potential to Achieve Goals: Good    Frequency 7X/week    Barriers to discharge        Co-evaluation               AM-PAC PT "6 Clicks" Daily Activity  Outcome Measure Difficulty turning over in bed (including adjusting bedclothes, sheets and blankets)?: Unable Difficulty moving from lying on back to sitting on the side of the bed? : Unable Difficulty sitting down on and standing up from a chair with arms (e.g., wheelchair, bedside commode, etc,.)?: Unable Help needed moving to and from a bed to chair (including a wheelchair)?: A Little Help needed walking in hospital room?: A Little Help needed climbing 3-5 steps with a railing? : A Lot 6 Click Score: 11    End of Session Equipment Utilized During Treatment: Gait belt Activity Tolerance: Patient tolerated treatment well;No increased pain Patient left: in bed;with call bell/phone within reach;with family/visitor present   PT Visit Diagnosis: Muscle weakness (generalized) (M62.81);Difficulty in walking, not elsewhere classified (R26.2)    Time: 8208-1388 PT Time Calculation (min) (ACUTE ONLY): 25 min   Charges:   PT Evaluation $PT Eval Low Complexity: 1 Low PT Treatments $Gait Training: 8-22 mins   PT G Codes:          Anna Rowe 08-May-2017, 1:34 PM

## 2017-04-29 NOTE — Discharge Instructions (Signed)

## 2017-04-29 NOTE — Evaluation (Signed)
Occupational Therapy Evaluation Patient Details Name: Anna Rowe MRN: [id number] DOB: 1976-07-14 Today's Date: 04/29/2017    History of Present Illness s/p L THA; PMHx: R TKA   Clinical Impression   Pt is s/p THA resulting in the deficits listed below (see OT Problem List).  Pt will benefit from skilled OT to increase their safety and independence with ADL and functional mobility for ADL to facilitate discharge to venue listed below.        Follow Up Recommendations  No OT follow up    Equipment Recommendations  3 in 1 bedside commode    Recommendations for Other Services       Precautions / Restrictions Precautions Precautions: Fall Required Braces or Orthoses: Other Brace/Splint Other Brace/Splint: wears R knee brace for amb Restrictions Weight Bearing Restrictions: No      Mobility Bed Mobility Overal bed mobility: Needs Assistance Bed Mobility: Supine to Sit Rolling: Min assist   Supine to sit: Min assist     General bed mobility comments: assist wtih LLE  Transfers Overall transfer level: Needs assistance Equipment used: Rolling walker (2 wheeled) Transfers: Sit to/from Stand Sit to Stand: Min assist;From elevated surface         General transfer comment: cues for hand placement         ADL either performed or assessed with clinical judgement   ADL Overall ADL's : Needs assistance/impaired Eating/Feeding: Set up;Sitting   Grooming: Set up;Sitting   Upper Body Bathing: Set up;Sitting   Lower Body Bathing: Moderate assistance;Sit to/from stand;Cueing for safety;Cueing for sequencing   Upper Body Dressing : Set up;Sitting   Lower Body Dressing: Moderate assistance;Sit to/from stand;Cueing for safety;Cueing for sequencing   Toilet Transfer: Minimal assistance;BSC;RW;Ambulation;Cueing for sequencing;Cueing for safety   Toileting- Clothing Manipulation and Hygiene: Minimal assistance;Sit to/from stand;Cueing for safety;Cueing  for sequencing         General ADL Comments: pt states when she needs to urinate she needs to go very quickly     Vision Patient Visual Report: No change from baseline       Perception     Praxis      Pertinent Vitals/Pain Pain Assessment: 0-10 Pain Score: 3  Pain Location: left hip Pain Descriptors / Indicators: Aching Pain Intervention(s): Limited activity within patient's tolerance;Monitored during session;Ice applied     Hand Dominance     Extremity/Trunk Assessment Upper Extremity Assessment Upper Extremity Assessment: Overall WFL for tasks assessed   Lower Extremity Assessment Lower Extremity Assessment: LLE deficits/detail LLE Deficits / Details: ankle WFL; knee and hip grossly 2+/5, limited by post op pain and weakness       Communication Communication Communication: No difficulties   Cognition Arousal/Alertness: Awake/alert Behavior During Therapy: WFL for tasks assessed/performed Overall Cognitive Status: Within Functional Limits for tasks assessed                                           Shoulder Instructions      Home Living Family/patient expects to be discharged to:: Private residence Living Arrangements: Spouse/significant other Available Help at Discharge: Family Type of Home: House Home Access: Stairs to enter Technical brewer of Steps: 3  Entrance Stairs-Rails: Right Home Layout: One level               Home Equipment: Environmental consultant - 2 wheels;Bedside commode  Prior Functioning/Environment Level of Independence: Independent                 OT Problem List: Decreased strength;Decreased activity tolerance      OT Treatment/Interventions: Self-care/ADL training;Patient/family education;DME and/or AE instruction    OT Goals(Current goals can be found in the care plan section) Acute Rehab OT Goals Patient Stated Goal: have less pain OT Goal Formulation: With patient Time For Goal Achievement:  05/13/17  OT Frequency: Min 2X/week   Barriers to D/C:            Co-evaluation              AM-PAC PT "6 Clicks" Daily Activity     Outcome Measure Help from another person eating meals?: None Help from another person taking care of personal grooming?: None Help from another person toileting, which includes using toliet, bedpan, or urinal?: A Little Help from another person bathing (including washing, rinsing, drying)?: A Lot Help from another person to put on and taking off regular upper body clothing?: None Help from another person to put on and taking off regular lower body clothing?: A Little 6 Click Score: 20   End of Session Equipment Utilized During Treatment: Rolling walker Nurse Communication: Mobility status  Activity Tolerance: Patient limited by fatigue Patient left: in bed;with family/visitor present  OT Visit Diagnosis: Unsteadiness on feet (R26.81)                Time: 1210-1229 OT Time Calculation (min): 19 min Charges:  OT General Charges $OT Visit: 1 Visit OT Evaluation $OT Eval Moderate Complexity: 1 Mod G-Codes:     Kari Baars, OT [phone]  Payton Mccallum D 04/29/2017, 1:38 PM

## 2017-04-29 NOTE — Care Management Note (Signed)
Case Management Note  Patient Details  Name: Berdena Cisek MRN: [id number] Date of Birth: 06-24-1976  Subjective/Objective:  Left THA                  Action/Plan: Discharge Planning:  Spoke to pt and she has RW and bedside commode at home. Husband at home to assist with care. Waiting PT/OT recommendations for home. Pt states she does not feel HHPT will be needed. Will continue to follow for dc needs.   PCP [place]    Expected Discharge Date:               Expected Discharge Plan:  Home/Self Care  In-House Referral:  NA  Discharge planning Services  CM Consult  Post Acute Care Choice:  NA Choice offered to:  NA  DME Arranged:  N/A DME Agency:  NA  HH Arranged:  NA HH Agency:  NA  Status of Service:  Completed, signed off  If discussed at [place] of Stay Meetings, dates discussed:    Additional Comments:  Erenest Rasher, RN 04/29/2017, 12:46 PM

## 2017-04-29 NOTE — Progress Notes (Signed)
Subjective: 1 Day Post-Op Procedure(s) (LRB): LEFT TOTAL HIP ARTHROPLASTY ANTERIOR APPROACH (Left) Patient reports pain as moderate.    Objective: Vital signs in last 24 hours: Temp:  [97.7 F (36.5 C)-99.3 F (37.4 C)] 97.7 F (36.5 C) (09/15 0607) Pulse Rate:  [95-111] 95 (09/15 0607) Resp:  [10-20] 18 (09/15 0607) BP: (102-152)/(59-102) 102/63 (09/15 0607) SpO2:  [94 %-100 %] 98 % (09/15 0607) FiO2 (%):  [28 %] 28 % (09/14 1526) Weight:  [302 lb (137 kg)] 302 lb (137 kg) (09/14 1053)  Intake/Output from previous day: 09/14 0701 - 09/15 0700 In: 4632.5 [P.O.:1320; I.V.:3062.5; IV Piggyback:250] Out: 2850 [Urine:2550; Blood:300] Intake/Output this shift: Total I/O In: 628.8 [P.O.:480; I.V.:148.8] Out: -    Recent Labs  04/29/17 0605  HGB 10.6*    Recent Labs  04/29/17 0605  WBC 10.0  RBC 4.60  HCT 32.1*  PLT 303    Recent Labs  04/29/17 0605  NA 137  K 4.1  CL 107  CO2 22  BUN 12  CREATININE 0.79  GLUCOSE 186*  CALCIUM 8.4*   No results for input(s): LABPT, INR in the last 72 hours.  Sensation intact distally Intact pulses distally Dorsiflexion/Plantar flexion intact Incision: scant drainage  Assessment/Plan: 1 Day Post-Op Procedure(s) (LRB): LEFT TOTAL HIP ARTHROPLASTY ANTERIOR APPROACH (Left) Up with therapy Discharge home with home health next 1-2 days pending progress with therapy and pain control. She states that she has plenty of pain meds at home.  Mcarthur Rossetti 04/29/2017, 9:18 AM

## 2017-04-29 NOTE — Progress Notes (Signed)
Physical Therapy Treatment Patient Details Name: Anna Rowe MRN: [id number] DOB: 09-Jan-1976 Today's Date: 04/29/2017    History of Present Illness s/p L THA; PMHx: R TKA    PT Comments    Pt cooperative and progressing steadily with program.   Follow Up Recommendations  Home health PT     Equipment Recommendations  None recommended by PT    Recommendations for Other Services OT consult     Precautions / Restrictions Precautions Precautions: Fall Required Braces or Orthoses: Other Brace/Splint Other Brace/Splint: wears R knee brace for amb Restrictions Weight Bearing Restrictions: No Other Position/Activity Restrictions: WBAT    Mobility  Bed Mobility Overal bed mobility: Needs Assistance Bed Mobility: Supine to Sit Rolling: Min assist;Mod assist   Supine to sit: Min assist     General bed mobility comments: assist wtih LLE and to bring trunk to upright position.    Transfers Overall transfer level: Needs assistance Equipment used: Rolling walker (2 wheeled) Transfers: Sit to/from Stand Sit to Stand: Min assist;From elevated surface         General transfer comment: pt self-cues for hand placement   Ambulation/Gait Ambulation/Gait assistance: Min guard;Supervision Ambulation Distance (Feet): 90 Feet Assistive device: Rolling walker (2 wheeled) Gait Pattern/deviations: Step-to pattern;Antalgic Gait velocity: decr Gait velocity interpretation: Below normal speed for age/gender General Gait Details: cues for sequence, posture   Stairs            Wheelchair Mobility    Modified Rankin (Stroke Patients Only)       Balance                                            Cognition Arousal/Alertness: Awake/alert Behavior During Therapy: WFL for tasks assessed/performed Overall Cognitive Status: Within Functional Limits for tasks assessed                                        Exercises  Total Joint Exercises Ankle Circles/Pumps: AROM;Both;15 reps;Supine Quad Sets: AROM;Both;10 reps;Supine Heel Slides: AAROM;Left;20 reps;Supine Hip ABduction/ADduction: AAROM;Left;15 reps;Supine    General Comments        Pertinent Vitals/Pain Pain Assessment: 0-10 Pain Score: 3  Pain Location: left hip Pain Descriptors / Indicators: Aching;Sore Pain Intervention(s): Limited activity within patient's tolerance    Home Living Family/patient expects to be discharged to:: Private residence Living Arrangements: Spouse/significant other Available Help at Discharge: Family Type of Home: House Home Access: Stairs to enter Entrance Stairs-Rails: Right Home Layout: One level Home Equipment: Environmental consultant - 2 wheels;Bedside commode      Prior Function Level of Independence: Independent          PT Goals (current goals can now be found in the care plan section) Acute Rehab PT Goals Patient Stated Goal: have less pain PT Goal Formulation: With patient Time For Goal Achievement: 05/03/17 Potential to Achieve Goals: Good Progress towards PT goals: Progressing toward goals    Frequency    7X/week      PT Plan Current plan remains appropriate    Co-evaluation              AM-PAC PT "6 Clicks" Daily Activity  Outcome Measure  Difficulty turning over in bed (including adjusting bedclothes, sheets and blankets)?: Unable Difficulty moving from lying on back to sitting  on the side of the bed? : Unable Difficulty sitting down on and standing up from a chair with arms (e.g., wheelchair, bedside commode, etc,.)?: Unable Help needed moving to and from a bed to chair (including a wheelchair)?: A Little Help needed walking in hospital room?: A Little Help needed climbing 3-5 steps with a railing? : A Lot 6 Click Score: 11    End of Session Equipment Utilized During Treatment: Gait belt Activity Tolerance: Patient tolerated treatment well;No increased pain Patient left: in  bed;with call bell/phone within reach;with family/visitor present Nurse Communication: Mobility status PT Visit Diagnosis: Muscle weakness (generalized) (M62.81);Difficulty in walking, not elsewhere classified (R26.2)     Time: 5320-2334 PT Time Calculation (min) (ACUTE ONLY): 37 min  Charges:  $Gait Training: 8-22 mins $Therapeutic Exercise: 8-22 mins                    G Codes:       Pg [phone]    Kyisha Fowle 04/29/2017, 4:06 PM

## 2017-04-30 LAB — GLUCOSE, CAPILLARY
Glucose-Capillary: 165 mg/dL — ABNORMAL HIGH (ref 65–99)
Glucose-Capillary: 150 mg/dL — ABNORMAL HIGH (ref 65–99)

## 2017-04-30 NOTE — Progress Notes (Addendum)
Physical Therapy Treatment Patient Details Name: Anna Rowe MRN: [id number] DOB: February 15, 1976 Today's Date: 04/30/2017    History of Present Illness s/p L THA; PMHx: R TKA    PT Comments    Patient is ready for DC . Reviewed verbally exercises to be done 2x /day.  new rubber tip for patient's cane provided and a leg lifter.  Follow Up Recommendations  DC plan and follow up therapy as arranged by surgeon     Equipment Recommendations  None recommended by PT    Recommendations for Other Services       Precautions / Restrictions Precautions Precautions: Fall Required Braces or Orthoses: Other Brace/Splint Other Brace/Splint: wears R knee brace for amb Restrictions Weight Bearing Restrictions: No Other Position/Activity Restrictions: WBAT    Mobility  Bed Mobility Overal bed mobility: Needs Assistance Bed Mobility: Supine to Sit Rolling: Mod assist   Supine to sit: Mod assist     General bed mobility comments: daughter assisted the patient to sitting  on the bed edge, assisted with the  left leg  Transfers Overall transfer level: Needs assistance Equipment used: Rolling walker (2 wheeled) Transfers: Sit to/from Stand Sit to Stand: Min assist;From elevated surface         General transfer comment: cues for hand and left leg posiition  Ambulation/Gait Ambulation/Gait assistance: Min guard Ambulation Distance (Feet): 40 Feet Assistive device: Rolling walker (2 wheeled) Gait Pattern/deviations: Step-to pattern Gait velocity: decr   General Gait Details: cues for sequence, posture   Stairs Stairs: Yes   Stair Management: One rail Left;Step to pattern;Forwards;With cane Number of Stairs: 2 General stair comments: cues for sequence  Wheelchair Mobility    Modified Rankin (Stroke Patients Only)       Balance                                            Cognition Arousal/Alertness: Awake/alert Behavior During  Therapy: WFL for tasks assessed/performed Overall Cognitive Status: Within Functional Limits for tasks assessed                                        Exercises      General Comments        Pertinent Vitals/Pain Pain Score: 5  Pain Location: left hip Pain Descriptors / Indicators: Aching;Sore Pain Intervention(s): Monitored during session;Premedicated before session    [place] expects to be discharged to:: Private residence Living Arrangements: Spouse/significant other Available Help at Discharge: Family Type of Home: House Home Access: Stairs to enter Entrance Stairs-Rails: Right Home Layout: One level Home Equipment: Environmental consultant - 2 wheels;Bedside commode      Prior Function Level of Independence: Independent          PT Goals (current goals can now be found in the care plan section) Progress towards PT goals: Progressing toward goals    Frequency    7X/week      PT Plan Current plan remains appropriate    Co-evaluation              AM-PAC PT "6 Clicks" Daily Activity  Outcome Measure  Difficulty turning over in bed (including adjusting bedclothes, sheets and blankets)?: Unable Difficulty moving from lying on back to sitting on the side of the bed? : Unable Difficulty  sitting down on and standing up from a chair with arms (e.g., wheelchair, bedside commode, etc,.)?: Unable Help needed moving to and from a bed to chair (including a wheelchair)?: A Little Help needed walking in hospital room?: A Little Help needed climbing 3-5 steps with a railing? : A Little 6 Click Score: 12    End of Session   Activity Tolerance: Patient tolerated treatment well;No increased pain Patient left: in bed;with call bell/phone within reach;with family/visitor present Nurse Communication: Mobility status PT Visit Diagnosis: Muscle weakness (generalized) (M62.81);Difficulty in walking, not elsewhere classified (R26.2)     Time:  6144-3154 PT Time Calculation (min) (ACUTE ONLY): 14 min  Charges:  $Gait Training: 8-22 mins                    G Codes:     Claretha Cooper 04/30/2017, 2:00 PM  Tresa Endo PT [phone]

## 2017-04-30 NOTE — Progress Notes (Signed)
Occupational Therapy Treatment Patient Details Name: Anna Rowe MRN: [id number] DOB: 1976/06/10 Today's Date: 04/30/2017    History of present illness s/p L THA; PMHx: R TKA   OT comments  Focused on bed mobility and using leg lifter   Follow Up Recommendations  No OT follow up    Equipment Recommendations  3 in 1 bedside commode    Recommendations for Other Services      Precautions / Restrictions Precautions Precautions: Fall Required Braces or Orthoses: Other Brace/Splint Other Brace/Splint: wears R knee brace for amb Restrictions Weight Bearing Restrictions: No Other Position/Activity Restrictions: WBAT       Mobility Bed Mobility Overal bed mobility: Needs Assistance Bed Mobility: Supine to Sit Rolling: Mod assist   Supine to sit: Mod assist     General bed mobility comments: practiced with leg lifter .  Pt may want to obtain one for home  Transfers                 General transfer comment: pt declined        ADL either performed or assessed with clinical judgement        Vision Patient Visual Report: No change from baseline            Cognition Arousal/Alertness: Awake/alert Behavior During Therapy: WFL for tasks assessed/performed Overall Cognitive Status: Within Functional Limits for tasks assessed                                                General Comments      Pertinent Vitals/ Pain       Pain Location: left hip Pain Descriptors / Indicators: Aching;Sore Pain Intervention(s): Limited activity within patient's tolerance  Home Living Family/patient expects to be discharged to:: Private residence Living Arrangements: Spouse/significant other Available Help at Discharge: Family Type of Home: House Home Access: Stairs to enter Technical brewer of Steps: 3  Entrance Stairs-Rails: Right Home Layout: One level               Home Equipment: Environmental consultant - 2 wheels;Bedside commode           Prior Functioning/Environment Level of Independence: Independent            Frequency  Min 2X/week        Progress Toward Goals  OT Goals(current goals can now be found in the care plan section)        Plan         AM-PAC PT "6 Clicks" Daily Activity     Outcome Measure   Help from another person eating meals?: None Help from another person taking care of personal grooming?: None Help from another person toileting, which includes using toliet, bedpan, or urinal?: A Little Help from another person bathing (including washing, rinsing, drying)?: A Lot Help from another person to put on and taking off regular upper body clothing?: None Help from another person to put on and taking off regular lower body clothing?: A Little 6 Click Score: 20    End of Session Equipment Utilized During Treatment: Rolling walker  OT Visit Diagnosis: Unsteadiness on feet (R26.81)   Activity Tolerance Patient limited by pain   Patient Left in bed;with family/visitor present   Nurse Communication Mobility status        Time: 2778-2423 OT Time Calculation (min): 10 min  Charges:  OT General Charges $OT Visit: 1 Visit OT Treatments $Self Care/Home Management : 8-22 mins  [place], [place]   Betsy Pries 04/30/2017, 11:08 AM

## 2017-04-30 NOTE — Progress Notes (Signed)
   Subjective: 2 Days Post-Op Procedure(s) (LRB): LEFT TOTAL HIP ARTHROPLASTY ANTERIOR APPROACH (Left) Patient reports pain as mild and moderate.    Objective: Vital signs in last 24 hours: Temp:  [98 F (36.7 C)-98.9 F (37.2 C)] 98 F (36.7 C) (09/16 0700) Pulse Rate:  [104-116] 104 (09/16 0700) Resp:  [17-20] 20 (09/16 0700) BP: (120-147)/(70-78) 120/70 (09/16 0700) SpO2:  [99 %] 99 % (09/16 0700)  Intake/Output from previous day: 09/15 0701 - 09/16 0700 In: 868.8 [P.O.:720; I.V.:148.8] Out: 3009 [Urine:3175] Intake/Output this shift: Total I/O In: 240 [P.O.:240] Out: 850 [Urine:850]   Recent Labs  04/29/17 0605  HGB 10.6*    Recent Labs  04/29/17 0605  WBC 10.0  RBC 4.60  HCT 32.1*  PLT 303    Recent Labs  04/29/17 0605  NA 137  K 4.1  CL 107  CO2 22  BUN 12  CREATININE 0.79  GLUCOSE 186*  CALCIUM 8.4*   No results for input(s): LABPT, INR in the last 72 hours.  Neurologically intact No results found.  Assessment/Plan: 2 Days Post-Op Procedure(s) (LRB): LEFT TOTAL HIP ARTHROPLASTY ANTERIOR APPROACH (Left) Up with therapy , she wants to go home today after PT. Needs to do stairs which she already did with TKA in the past.   Anna Rowe 04/30/2017, 12:20 PM

## 2017-04-30 NOTE — Progress Notes (Signed)
Pt discharged to home. DC instructions given. No concerns voiced. Prescription given for flexeril only. Pt left unit in wheelchair pushed by nurse tech accompanied by young female family member. No concerns voiced. Left in stable condition. Hale Bogus.

## 2017-05-01 LAB — GLUCOSE, CAPILLARY: Glucose-Capillary: 168 mg/dL — ABNORMAL HIGH (ref 65–99)

## 2017-05-01 NOTE — Op Note (Signed)
NAME:  Anna Rowe, Anna Rowe             ACCOUNT NO.:  MEDICAL RECORD NO.:  [id number]  LOCATION:                                 FACILITY:  PHYSICIAN:  Lind Guest. 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  PROCEDURE:  Left total hip arthroplasty through direct anterior approach.  IMPLANTS:  DePuy Sector Gription acetabular component size 52 with a single screw, size 36 +0 polyethylene liner, size 11 Corail femoral component with varus offset, size 36 +1.5 ceramic hip ball.  SURGEON:  Lind Guest. Ninfa Linden, MD.  ASSISTANT:  Erskine Emery, PA-C.  ANESTHESIA:  General.  ANTIBIOTICS:  IV Ancef 3 g.  BLOOD LOSS:  Less than 500 mL.  COMPLICATIONS:  None.  INDICATIONS:  Anna Rowe is a 41 year old female with debilitating arthritis involving her left and right hips.  She is a morbidly obese individual and is on chronic narcotic pain medication. She has had a history of a right total knee arthroplasty.  She has also had a history of a deep vein thrombosis, and we had to delay surgery for a while.  Her pain has become towards detrimentally affecting her activities of daily living, her quality of life, and her mobility.  It is 10/10.  X-rays confirmed severe arthritis of both her hips.  Her left is more painful than the right.  At this point, we recommended a total hip arthroplasty.  She understands the risk of acute blood loss anemia, nerve and vessel injury, fracture, infection, dislocation, DVT.  She understands these risks are also significantly high given her diabetes and her morbid obesity.  She understands our goals are to decrease pain, improve mobility, and overall  improve quality of life.  PROCEDURE DESCRIPTION:  After informed consent was obtained, appropriate left hip was marked.  She was brought to the operating room, where general anesthesia was obtained while she was on the stretcher.  Foley catheter was placed and both feet had traction boots applied to them. Next, she was placed supine on the Hana fracture table, the perineal post in place, and both feet in InLine Skeletal traction devices, but no traction applied.  Her left operative hip was prepped and draped with DuraPrep and sterile drapes.  A time-out was called.  She was identified as correct patient and correct left hip.  We then made incision inferior and posterior to the anterior superior iliac spine and carried this obliquely down the leg.  We dissected down tensor fascia lata muscle. Tensor fascia was then divided longitudinally to proceed with a direct anterior approach to the hip.  We identified and cauterized the circumflex vessels and identified the hip capsule.  I opened up the hip capsule in an L-type format, finding large joint effusion and significant arthritis in the hip.  We made our femoral neck cut proximal to the lesser trochanter and completed this on osteotome.  I removed the femoral head and found to be devoid of cartilage.  I  placed a bent Hohmann over the medial acetabular rim and then began reaming from a size 42 up to a size 52 with all reamers under direct visualization and last reamer under direct fluoroscopy, so we could obtain our depth of reaming, our inclination, and anteversion.  We then placed the real DePuy Sector Gription acetabular component size 52 after cleaning remnants of acetabular labrum.  We placed a single screw as well.  We placed a 36 +0 neutral polyethylene liner for that size 52 acetabular component.  Attention was then turned to the femur.  With the leg externally rotated to 120 degrees extended and adducted, we were able to place a  Mueller retractor medially and a Hohmann retractor behind the greater trochanter.  We released the lateral joint capsule and used a box cutting osteotome to enter the femoral canal and a rongeur to lateralize.  We then began broaching from a size 8 broach in the Corail broaching system up to a size 11.  With the size 11, we trialed a varus offset femoral neck and a 36 +1.5 hip ball.  We brought the leg back over and up with traction and internal rotation reducing the pelvis and we were pleased with leg length, offset, and stability.  Of note, we then dislocated the hip and removed the trial components and placed the real Corail femoral component size 11 with varus offset and the real 36 +1.5 hip ball.  We did trial a +5 hip ball first because we felt we needed more leg length and we could not reduce the hip, so we went with a 36 +1.5 and reduced the hip and we were pleased with the range of motion, offset, stability, and leg length.  We then irrigated the soft tissue with normal saline solution using pulsatile lavage.  We closed the tensor fascia with interrupted #1 Vicryl suture, followed by 0 Vicryl in the deep tissue, 2-0 Vicryl subcutaneous tissue, and interrupted staples on the skin.  Xeroform and well-padded sterile dressing were applied.  She was taken off the Hana table, awakened, extubated, and taken to recovery room in stable condition.  All final counts were correct.  There were no complications noted.  Of note, Erskine Emery, PA-C, assisted in entire case.  His assistance was crucial for facilitating all aspects of this case.     Lind Guest. Ninfa Linden, M.D.     CYB/MEDQ  D:  04/28/2017  T:  04/28/2017  Job:  [id number]

## 2017-05-01 NOTE — Addendum Note (Signed)
Addendum  created 05/01/17 1046 by Janeece Riggers, MD   Sign clinical note

## 2017-05-01 NOTE — Discharge Summary (Signed)
Patient ID: Anna Rowe MRN: [id number] DOB/AGE: 21-Jul-1976 41 y.o.  Admit date: 04/28/2017 Discharge date: 05/01/2017  Admission Diagnoses:  Principal Problem:   Unilateral primary osteoarthritis, left hip Active Problems:   Status post total replacement of left hip   Discharge Diagnoses:  Same  Past Medical History:  Diagnosis Date  . Diabetes mellitus ([place])    type 2   . DVT (deep venous thrombosis) ([place]) dx 10-2016   RLE   . HLD (hyperlipidemia)    on crestor  . Migraines   . Sleep apnea    per patient ; never been dx does not use CPAP     Surgeries: Procedure(s): LEFT TOTAL HIP ARTHROPLASTY ANTERIOR APPROACH on 04/28/2017   Consultants:   Discharged Condition: Improved  Hospital Course: Anna Rowe is an 41 y.o. female who was admitted 04/28/2017 for operative treatment ofUnilateral primary osteoarthritis, left hip. Patient has severe unremitting pain that affects sleep, daily activities, and work/hobbies. After pre-op clearance the patient was taken to the operating room on 04/28/2017 and underwent  Procedure(s): LEFT TOTAL HIP ARTHROPLASTY ANTERIOR APPROACH.    Patient was given perioperative antibiotics: Anti-infectives    Start     Dose/Rate Route Frequency Ordered Stop   04/28/17 1800  ceFAZolin (ANCEF) IVPB 2g/100 mL premix     2 g 200 mL/hr over 30 Minutes Intravenous Every 6 hours 04/28/17 1526 04/29/17 0100   04/28/17 1045  ceFAZolin (ANCEF) 3 g in dextrose 5 % 50 mL IVPB     3 g 130 mL/hr over 30 Minutes Intravenous  Once 04/28/17 1031 04/28/17 1226   04/26/17 0600  ceFAZolin (ANCEF) 3 g in dextrose 5 % 50 mL IVPB     3 g 130 mL/hr over 30 Minutes Intravenous On call to O.R. 04/25/17 1333 04/27/17 0559       Patient was given sequential compression devices, early ambulation, and chemoprophylaxis to prevent DVT.  Patient benefited maximally from hospital stay and there were no complications.    Recent vital signs: No  data found.    Recent laboratory studies:  Recent Labs  04/29/17 0605  WBC 10.0  HGB 10.6*  HCT 32.1*  PLT 303  NA 137  K 4.1  CL 107  CO2 22  BUN 12  CREATININE 0.79  GLUCOSE 186*  CALCIUM 8.4*     Discharge Medications:   Allergies as of 04/30/2017      Reactions   Methocarbamol Anaphylaxis, Swelling   Tramadol Anaphylaxis   Tolerates Dilaudid, Oxycontin 04/04/16   Bee Venom Hives      Medication List    TAKE these medications   amitriptyline 50 MG tablet Commonly known as:  ELAVIL Take 50 mg by mouth at bedtime.   CHANTIX CONTINUING MONTH PAK 1 MG tablet Generic drug:  varenicline Take 1 mg by mouth 2 (two) times daily.   ciclopirox 8 % solution Commonly known as:  PENLAC Apply 1 application topically 2 (two) times daily.   clotrimazole-betamethasone cream Commonly known as:  LOTRISONE Apply 1 application topically 2 (two) times daily.   cyclobenzaprine 10 MG tablet Commonly known as:  FLEXERIL Take 10 mg by mouth 3 (three) times daily.   docusate sodium 100 MG capsule Commonly known as:  COLACE Take 100 mg by mouth 2 (two) times daily.   EQL VITAMIN D3 2000 units Caps Generic drug:  Cholecalciferol Take 2,000 Units by mouth daily.   folic acid 1 MG tablet Commonly known as:  FOLVITE Take 1  mg by mouth daily.   gabapentin 600 MG tablet Commonly known as:  NEURONTIN Take 600 mg by mouth 3 (three) times daily.   insulin regular 100 units/mL injection Commonly known as:  NOVOLIN R,HUMULIN R Inject 5-10 Units into the skin daily as needed for high blood sugar (depends on blood sugar readings if takes  5-10 units).   medroxyPROGESTERone 150 MG/ML injection Commonly known as:  DEPO-PROVERA Inject 150 mg into the muscle every 3 (three) months. Last dose August 2018   meloxicam 15 MG tablet Commonly known as:  MOBIC Take 15 mg by mouth daily.   metFORMIN 500 MG 24 hr tablet Commonly known as:  GLUCOPHAGE-XR Take 500 mg by mouth 3 (three)  times daily with meals.   multivitamin with minerals Tabs tablet Take 1 tablet by mouth daily.   ONETOUCH VERIO test strip Generic drug:  glucose blood 3 (three) times daily. for testing   OXYCONTIN 10 mg 12 hr tablet Generic drug:  oxyCODONE Take 10 mg by mouth every 12 (twelve) hours.   Oxycodone HCl 10 MG Tabs Take 10 mg by mouth every 8 (eight) hours. for pain   polyethylene glycol powder powder Commonly known as:  GLYCOLAX/MIRALAX Take 17 g by mouth See admin instructions. Takes every 2 weeks for 3 days straight to prevent constipation   rosuvastatin 10 MG tablet Commonly known as:  CRESTOR Take 10 mg by mouth at bedtime.   XARELTO 20 MG Tabs tablet Generic drug:  rivaroxaban Take 20 mg by mouth daily.       Diagnostic Studies: Dg Pelvis Portable  Result Date: 04/28/2017 CLINICAL DATA:  Postop left hip replacement EXAM: PORTABLE PELVIS 1-2 VIEWS COMPARISON:  None. FINDINGS: Left total hip arthroplasty is in place. There is anatomic alignment of the osseous and prostatic structures. No breakage or loosening of the hardware. IMPRESSION: Left total hip arthroplasty anatomically aligned. Electronically Signed   By: Marybelle Killings M.D.   On: 04/28/2017 15:02   Dg C-arm 61-120 Min-no Report  Result Date: 04/28/2017 Fluoroscopy was utilized by the requesting physician.  No radiographic interpretation.   Dg Hip Operative Unilat With Pelvis Left  Result Date: 04/28/2017 CLINICAL DATA:  41 year old female status post left hip arthroplasty. EXAM: OPERATIVE LEFT HIP (WITH PELVIS IF PERFORMED) 2 VIEWS TECHNIQUE: Fluoroscopic spot image(s) were submitted for interpretation post-operatively. COMPARISON:  01/02/2017. FINDINGS: Two intraoperative fluoroscopic spot views of the inferior aspect of the bony pelvis in the left hip demonstrate interval left hip total arthroplasty. On this single projection provided, the prosthetic femoral head appears properly located in the prosthetic  acetabulum. No acute complicating features are noted. IMPRESSION: 1. Intraoperative documentation of left hip total arthroplasty. Electronically Signed   By: Vinnie Langton M.D.   On: 04/28/2017 13:54    Disposition: 01-Home or Self Care    Follow-up Information    Mcarthur Rossetti, MD Follow up in 2 week(s).   Specialty:  Orthopedic Surgery Contact information: [place] [address] [phone]            Signed: Erskine Emery 05/01/2017, 12:56 PM

## 2017-05-11 ENCOUNTER — Ambulatory Visit (INDEPENDENT_AMBULATORY_CARE_PROVIDER_SITE_OTHER): Payer: 59 | Admitting: Orthopaedic Surgery

## 2017-05-11 DIAGNOSIS — Z96642 Presence of left artificial hip joint: Secondary | ICD-10-CM

## 2017-05-11 NOTE — Progress Notes (Signed)
The patient is one-week tomorrow status post a left total hip arthroplasty through direct injury approach. She is doing well overall. She is working aggressively on improving her motion and her mobility.  On exam her incision looks good and I removed the staples and placed Steri-Strips. There is no evidence of wound breakdown. There is no significant seroma. Her leg lengths are near equal.  She'll continue increase her activities and we'll see her back in 4 weeks to see how she doing overall. She understands that she should come see [place] immediately if she is having any issues with her wound and her obesity. She is someone that does need a hip placement other side I would like to do that sometime in next several months due to a temporary Greenfield filter that she has due to a history of DVTs and blood clots. We also want to address her right knee at some point due to instability and pain of the previous right total knee arthroplasty was done elsewhere.

## 2017-06-08 ENCOUNTER — Ambulatory Visit (INDEPENDENT_AMBULATORY_CARE_PROVIDER_SITE_OTHER): Payer: 59 | Admitting: Orthopaedic Surgery

## 2017-06-09 ENCOUNTER — Ambulatory Visit (INDEPENDENT_AMBULATORY_CARE_PROVIDER_SITE_OTHER): Payer: 59 | Admitting: Orthopaedic Surgery

## 2017-06-09 DIAGNOSIS — M1611 Unilateral primary osteoarthritis, right hip: Secondary | ICD-10-CM

## 2017-06-09 DIAGNOSIS — Z96642 Presence of left artificial hip joint: Secondary | ICD-10-CM

## 2017-06-09 NOTE — Progress Notes (Signed)
The patient is now 6-7 weeks status post a left total hip arthroplasty.  She is doing well now without hip.  She would like to proceed at this point with a right total hip arthroplasty.  She has known and well-documented severe osteoarthritis and degenerative joint disease of her right hip and is failed all forms of conservative treatment.  Due to a DVT she does have a Greenfield filter in place which is a temporary filter.  I have talked with her vascular surgeon Dr. Trula Slade who would prefer me to proceed with her right total hip replacement so then the Greenfield filter can be removed sometime after that.  She says that she is doing very well with her left hip and leg is to proceed with her right hip.  On exam her left hip now moves smoothly.  Her incisions well-healed.  Her right hip shows significant limitations with internal and external rotation and severe pain with those motions.  Reviewing her x-rays once again show severe end-stage arthritis of the right hip and a well-seated implant in the left side.  The right hip has significant joint space narrowing with periarticular osteophytes and sclerotic changes as well.  At this point we will work on scheduling her right total hip in the month of November.  We then see her back in 2 weeks postoperative.

## 2017-06-30 ENCOUNTER — Other Ambulatory Visit (INDEPENDENT_AMBULATORY_CARE_PROVIDER_SITE_OTHER): Payer: Self-pay | Admitting: Physician Assistant

## 2017-07-03 ENCOUNTER — Other Ambulatory Visit (INDEPENDENT_AMBULATORY_CARE_PROVIDER_SITE_OTHER): Payer: Self-pay

## 2017-07-03 ENCOUNTER — Encounter (HOSPITAL_COMMUNITY): Payer: Self-pay

## 2017-07-03 NOTE — Patient Instructions (Addendum)
Anna Rowe  07/03/2017   Your procedure is scheduled on: 07-14-17  Report to [facility] Main  Entrance .             Report to admitting at      0830AM   Call this number if you have problems the morning of surgery [phone]    Remember: ONLY 1 PERSON MAY GO WITH YOU TO SHORT STAY TO GET  READY MORNING OF YOUR SURGERY.  Do not eat food or drink liquids :After Midnight.     Take these medicines the morning of surgery with A SIP OF WATER: oxycodone if needed, gabapentin DO NOT TAKE ANY DIABETIC MEDICATIONS DAY OF YOUR SURGERY                               You may not have any metal on your body including hair pins and              piercings  Do not wear jewelry, make-up, lotions, powders or perfumes, deodorant             Do not wear nail polish.  Do not shave  48 hours prior to surgery.     Do not bring valuables to the hospital. [place].  Contacts, dentures or bridgework may not be worn into surgery.  Leave suitcase in the car. After surgery it may be brought to your room.                Please read over the following fact sheets you were given: _____________________________________________________________________           [facility] - Preparing for Surgery Before surgery, you can play an important role.  Because skin is not sterile, your skin needs to be as free of germs as possible.  You can reduce the number of germs on your skin by washing with CHG (chlorahexidine gluconate) soap before surgery.  CHG is an antiseptic cleaner which kills germs and bonds with the skin to continue killing germs even after washing. Please DO NOT use if you have an allergy to CHG or antibacterial soaps.  If your skin becomes reddened/irritated stop using the CHG and inform your nurse when you arrive at Short Stay. Do not shave (including legs and underarms) for at least 48 hours prior to the first  CHG shower.  You may shave your face/neck. Please follow these instructions carefully:  1.  Shower with CHG Soap the night before surgery and the  morning of Surgery.  2.  If you choose to wash your hair, wash your hair first as usual with your  normal  shampoo.  3.  After you shampoo, rinse your hair and body thoroughly to remove the  shampoo.                           4.  Use CHG as you would any other liquid soap.  You can apply chg directly  to the skin and wash                       Gently with a scrungie or clean washcloth.  5.  Apply the CHG  Soap to your body ONLY FROM THE NECK DOWN.   Do not use on face/ open                           Wound or open sores. Avoid contact with eyes, ears mouth and genitals (private parts).                       Wash face,  Genitals (private parts) with your normal soap.             6.  Wash thoroughly, paying special attention to the area where your surgery  will be performed.  7.  Thoroughly rinse your body with warm water from the neck down.  8.  DO NOT shower/wash with your normal soap after using and rinsing off  the CHG Soap.                9.  Pat yourself dry with a clean towel.            10.  Wear clean pajamas.            11.  Place clean sheets on your bed the night of your first shower and do not  sleep with pets. Day of Surgery : Do not apply any lotions/deodorants the morning of surgery.  Please wear clean clothes to the hospital/surgery center.  FAILURE TO FOLLOW THESE INSTRUCTIONS MAY RESULT IN THE CANCELLATION OF YOUR SURGERY PATIENT SIGNATURE_________________________________  NURSE SIGNATURE__________________________________  ________________________________________________________________________ How to Manage Your Diabetes Before and After Surgery  Why is it important to control my blood sugar before and after surgery? . Improving blood sugar levels before and after surgery helps healing and can limit problems. . A way of  improving blood sugar control is eating a healthy diet by: o  Eating less sugar and carbohydrates o  Increasing activity/exercise o  Talking with your doctor about reaching your blood sugar goals . High blood sugars (greater than 180 mg/dL) can raise your risk of infections and slow your recovery, so you will need to focus on controlling your diabetes during the weeks before surgery. . Make sure that the doctor who takes care of your diabetes knows about your planned surgery including the date and location.  How do I manage my blood sugar before surgery? . Check your blood sugar at least 4 times a day, starting 2 days before surgery, to make sure that the level is not too high or low. o Check your blood sugar the morning of your surgery when you wake up and every 2 hours until you get to the Short Stay unit. . If your blood sugar is less than 70 mg/dL, you will need to treat for low blood sugar: o Do not take insulin. o Treat a low blood sugar (less than 70 mg/dL) with  cup of clear juice (cranberry or apple), 4 glucose tablets, OR glucose gel. o Recheck blood sugar in 15 minutes after treatment (to make sure it is greater than 70 mg/dL). If your blood sugar is not greater than 70 mg/dL on recheck, call [phone] for further instructions. . Report your blood sugar to the short stay nurse when you get to Short Stay.  . If you are admitted to the hospital after surgery: o Your blood sugar will be checked by the staff and you will probably be given insulin after surgery (instead of oral diabetes medicines) to make sure you  have good blood sugar levels. o The goal for blood sugar control after surgery is 80-180 mg/dL.   WHAT DO I DO ABOUT MY DIABETES MEDICATION?  Marland Kitchen Do not take oral diabetes medicines (pills) the morning of surgery.  . THE DAY BEFORE SURGERY, take   Only morning and lunch dose of [place]lin R if needed none at bedtime.      .   . The day of surgery, do not take other diabetes  injectables, including Byetta (exenatide), Bydureon (exenatide ER), Victoza (liraglutide), or Trulicity (dulaglutide).  . If your CBG is greater than 220 mg/dL, you may take  of your sliding scale  . (correction) dose of insulin.      Patient Signature:  Date:   Nurse Signature:  Date:   Reviewed and Endorsed by [facility] Patient Education Committee, August 2015 WHAT IS A BLOOD TRANSFUSION? Blood Transfusion Information  A transfusion is the replacement of blood or some of its parts. Blood is made up of multiple cells which provide different functions.  Red blood cells carry oxygen and are used for blood loss replacement.  White blood cells fight against infection.  Platelets control bleeding.  Plasma helps clot blood.  Other blood products are available for specialized needs, such as hemophilia or other clotting disorders. BEFORE THE TRANSFUSION  Who gives blood for transfusions?   Healthy volunteers who are fully evaluated to make sure their blood is safe. This is blood bank blood. Transfusion therapy is the safest it has ever been in the practice of medicine. Before blood is taken from a donor, a complete history is taken to make sure that person has no history of diseases nor engages in risky social behavior (examples are intravenous drug use or sexual activity with multiple partners). The donor's travel history is screened to minimize risk of transmitting infections, such as malaria. The donated blood is tested for signs of infectious diseases, such as HIV and hepatitis. The blood is then tested to be sure it is compatible with you in order to minimize the chance of a transfusion reaction. If you or a relative donates blood, this is often done in anticipation of surgery and is not appropriate for emergency situations. It takes many days to process the donated blood. RISKS AND COMPLICATIONS Although transfusion therapy is very safe and saves many lives, the main dangers of  transfusion include:   Getting an infectious disease.  Developing a transfusion reaction. This is an allergic reaction to something in the blood you were given. Every precaution is taken to prevent this. The decision to have a blood transfusion has been considered carefully by your caregiver before blood is given. Blood is not given unless the benefits outweigh the risks. AFTER THE TRANSFUSION  Right after receiving a blood transfusion, you will usually feel much better and more energetic. This is especially true if your red blood cells have gotten low (anemic). The transfusion raises the level of the red blood cells which carry oxygen, and this usually causes an energy increase.  The nurse administering the transfusion will monitor you carefully for complications. HOME CARE INSTRUCTIONS  No special instructions are needed after a transfusion. You may find your energy is better. Speak with your caregiver about any limitations on activity for underlying diseases you may have. SEEK MEDICAL CARE IF:   Your condition is not improving after your transfusion.  You develop redness or irritation at the intravenous (IV) site. SEEK IMMEDIATE MEDICAL CARE IF:  Any of the following  symptoms occur over the next 12 hours:  Shaking chills.  You have a temperature by mouth above 102 F (38.9 C), not controlled by medicine.  Chest, back, or muscle pain.  People around you feel you are not acting correctly or are confused.  Shortness of breath or difficulty breathing.  Dizziness and fainting.  You get a rash or develop hives.  You have a decrease in urine output.  Your urine turns a dark color or changes to pink, red, or brown. Any of the following symptoms occur over the next 10 days:  You have a temperature by mouth above 102 F (38.9 C), not controlled by medicine.  Shortness of breath.  Weakness after normal activity.  The white part of the eye turns yellow (jaundice).  You have a  decrease in the amount of urine or are urinating less often.  Your urine turns a dark color or changes to pink, red, or brown. Document Released: 07/29/2000 Document Revised: 10/24/2011 Document Reviewed: 03/17/2008 ExitCare Patient Information 2014 [place].  _______________________________________________________________________  Incentive Spirometer  An incentive spirometer is a tool that can help keep your lungs clear and active. This tool measures how well you are filling your lungs with each breath. Taking long deep breaths may help reverse or decrease the chance of developing breathing (pulmonary) problems (especially infection) following:  A long period of time when you are unable to move or be active. BEFORE THE PROCEDURE   If the spirometer includes an indicator to show your best effort, your nurse or respiratory therapist will set it to a desired goal.  If possible, sit up straight or lean slightly forward. Try not to slouch.  Hold the incentive spirometer in an upright position. INSTRUCTIONS FOR USE  1. Sit on the edge of your bed if possible, or sit up as far as you can in bed or on a chair. 2. Hold the incentive spirometer in an upright position. 3. Breathe out normally. 4. Place the mouthpiece in your mouth and seal your lips tightly around it. 5. Breathe in slowly and as deeply as possible, raising the piston or the ball toward the top of the column. 6. Hold your breath for 3-5 seconds or for as long as possible. Allow the piston or ball to fall to the bottom of the column. 7. Remove the mouthpiece from your mouth and breathe out normally. 8. Rest for a few seconds and repeat Steps 1 through 7 at least 10 times every 1-2 hours when you are awake. Take your time and take a few normal breaths between deep breaths. 9. The spirometer may include an indicator to show your best effort. Use the indicator as a goal to work toward during each repetition. 10. After each set  of 10 deep breaths, practice coughing to be sure your lungs are clear. If you have an incision (the cut made at the time of surgery), support your incision when coughing by placing a pillow or rolled up towels firmly against it. Once you are able to get out of bed, walk around indoors and cough well. You may stop using the incentive spirometer when instructed by your caregiver.  RISKS AND COMPLICATIONS  Take your time so you do not get dizzy or light-headed.  If you are in pain, you may need to take or ask for pain medication before doing incentive spirometry. It is harder to take a deep breath if you are having pain. AFTER USE  Rest and breathe slowly and easily.  It can be helpful to keep track of a log of your progress. Your caregiver can provide you with a simple table to help with this. If you are using the spirometer at home, follow these instructions: [place] IF:   You are having difficultly using the spirometer.  You have trouble using the spirometer as often as instructed.  Your pain medication is not giving enough relief while using the spirometer.  You develop fever of 100.5 F (38.1 C) or higher. SEEK IMMEDIATE MEDICAL CARE IF:   You cough up bloody sputum that had not been present before.  You develop fever of 102 F (38.9 C) or greater.  You develop worsening pain at or near the incision site. MAKE SURE YOU:   Understand these instructions.  Will watch your condition.  Will get help right away if you are not doing well or get worse. Document Released: 12/12/2006 Document Revised: 10/24/2011 Document Reviewed: 02/12/2007 [facility] Patient Information 2014 [place], [place].   ________________________________________________________________________

## 2017-07-03 NOTE — Progress Notes (Signed)
04-25-17 ekg in epic

## 2017-07-04 ENCOUNTER — Encounter (HOSPITAL_COMMUNITY): Payer: Self-pay

## 2017-07-04 ENCOUNTER — Other Ambulatory Visit: Payer: Self-pay

## 2017-07-04 ENCOUNTER — Encounter (HOSPITAL_COMMUNITY)
Admission: RE | Admit: 2017-07-04 | Discharge: 2017-07-04 | Disposition: A | Discharge status: Home / Self Care | Payer: 59 | Source: Ambulatory Visit | Attending: Orthopaedic Surgery | Admitting: Orthopaedic Surgery

## 2017-07-04 DIAGNOSIS — Z01812 Encounter for preprocedural laboratory examination: Secondary | ICD-10-CM | POA: Insufficient documentation

## 2017-07-04 HISTORY — DX: Pneumonia, unspecified organism: J18.9

## 2017-07-04 HISTORY — DX: Unspecified osteoarthritis, unspecified site: M19.90

## 2017-07-04 LAB — BASIC METABOLIC PANEL
Anion gap: 9 (ref 5–15)
BUN: 15 mg/dL (ref 6–20)
CO2: 24 mmol/L (ref 22–32)
Calcium: 10.4 mg/dL — ABNORMAL HIGH (ref 8.9–10.3)
Chloride: 109 mmol/L (ref 101–111)
Creatinine, Ser: 0.77 mg/dL (ref 0.44–1.00)
GFR calc Af Amer: >60 mL/min (ref >60)
GFR calc non Af Amer: >60 mL/min (ref >60)
Glucose, Bld: 161 mg/dL — ABNORMAL HIGH (ref 65–99)
Potassium: 4.3 mmol/L (ref 3.5–5.1)
Sodium: 142 mmol/L (ref 135–145)

## 2017-07-04 LAB — CBC
HCT: 42.1 % (ref 36.0–46.0)
Hemoglobin: 13.6 g/dL (ref 12.0–15.0)
MCH: 22.6 pg — ABNORMAL LOW (ref 26.0–34.0)
MCHC: 32.3 g/dL (ref 30.0–36.0)
MCV: 69.9 fL — ABNORMAL LOW (ref 78.0–100.0)
Platelets: 447 10*3/uL — ABNORMAL HIGH (ref 150–400)
RBC: 6.02 MIL/uL — ABNORMAL HIGH (ref 3.87–5.11)
RDW: 16.2 % — ABNORMAL HIGH (ref 11.5–15.5)
WBC: 12.2 10*3/uL — ABNORMAL HIGH (ref 4.0–10.5)

## 2017-07-04 LAB — GLUCOSE, CAPILLARY: Glucose-Capillary: 199 mg/dL — ABNORMAL HIGH (ref 65–99)

## 2017-07-04 LAB — SURGICAL PCR SCREEN
MRSA, PCR: NEGATIVE
Staphylococcus aureus: POSITIVE — AB

## 2017-07-04 LAB — ABO/RH: ABO/RH(D): O POS

## 2017-07-04 LAB — HCG, SERUM, QUALITATIVE: Preg, Serum: NEGATIVE

## 2017-07-04 NOTE — Progress Notes (Signed)
hgb a1c 05/18/17 chart

## 2017-07-13 MED ORDER — DEXTROSE 5 % IV SOLN
3.0000 g | INTRAVENOUS | Status: AC
  Administered 2017-07-14: 3 g via INTRAVENOUS
  Filled 2017-07-13: qty 3

## 2017-07-14 ENCOUNTER — Inpatient Hospital Stay (HOSPITAL_COMMUNITY): Payer: 59

## 2017-07-14 ENCOUNTER — Other Ambulatory Visit: Payer: Self-pay

## 2017-07-14 ENCOUNTER — Encounter (HOSPITAL_COMMUNITY): Payer: Self-pay | Admitting: *Deleted

## 2017-07-14 ENCOUNTER — Inpatient Hospital Stay (HOSPITAL_COMMUNITY)
Admission: RE | Admit: 2017-07-14 | Discharge: 2017-07-15 | DRG: 470 | Disposition: A | Discharge status: Home Health | Payer: 59 | Source: Ambulatory Visit | Attending: Orthopaedic Surgery | Admitting: Orthopaedic Surgery

## 2017-07-14 ENCOUNTER — Inpatient Hospital Stay (HOSPITAL_COMMUNITY): Payer: 59 | Admitting: Anesthesiology

## 2017-07-14 ENCOUNTER — Encounter (HOSPITAL_COMMUNITY)
Admission: RE | Disposition: A | Discharge status: Home Health | Payer: Self-pay | Source: Ambulatory Visit | Attending: Orthopaedic Surgery

## 2017-07-14 DIAGNOSIS — F17210 Nicotine dependence, cigarettes, uncomplicated: Secondary | ICD-10-CM | POA: Diagnosis present

## 2017-07-14 DIAGNOSIS — E785 Hyperlipidemia, unspecified: Secondary | ICD-10-CM | POA: Diagnosis present

## 2017-07-14 DIAGNOSIS — G4730 Sleep apnea, unspecified: Secondary | ICD-10-CM | POA: Diagnosis present

## 2017-07-14 DIAGNOSIS — M1611 Unilateral primary osteoarthritis, right hip: Secondary | ICD-10-CM | POA: Diagnosis present

## 2017-07-14 DIAGNOSIS — Z86718 Personal history of other venous thrombosis and embolism: Secondary | ICD-10-CM

## 2017-07-14 DIAGNOSIS — Z96651 Presence of right artificial knee joint: Secondary | ICD-10-CM | POA: Diagnosis present

## 2017-07-14 DIAGNOSIS — Z96642 Presence of left artificial hip joint: Secondary | ICD-10-CM | POA: Diagnosis present

## 2017-07-14 DIAGNOSIS — Z6841 Body Mass Index (BMI) 40.0 and over, adult: Secondary | ICD-10-CM

## 2017-07-14 DIAGNOSIS — M25551 Pain in right hip: Secondary | ICD-10-CM | POA: Diagnosis present

## 2017-07-14 DIAGNOSIS — Z96641 Presence of right artificial hip joint: Secondary | ICD-10-CM | POA: Diagnosis not present

## 2017-07-14 DIAGNOSIS — E119 Type 2 diabetes mellitus without complications: Secondary | ICD-10-CM | POA: Diagnosis present

## 2017-07-14 DIAGNOSIS — Z419 Encounter for procedure for purposes other than remedying health state, unspecified: Secondary | ICD-10-CM

## 2017-07-14 DIAGNOSIS — M1711 Unilateral primary osteoarthritis, right knee: Secondary | ICD-10-CM

## 2017-07-14 HISTORY — PX: TOTAL HIP ARTHROPLASTY: SHX124

## 2017-07-14 LAB — TYPE AND SCREEN
ABO/RH(D): O POS
Antibody Screen: NEGATIVE

## 2017-07-14 LAB — GLUCOSE, CAPILLARY
Glucose-Capillary: 183 mg/dL — ABNORMAL HIGH (ref 65–99)
Glucose-Capillary: 146 mg/dL — ABNORMAL HIGH (ref 65–99)
Glucose-Capillary: 220 mg/dL — ABNORMAL HIGH (ref 65–99)
Glucose-Capillary: 269 mg/dL — ABNORMAL HIGH (ref 65–99)

## 2017-07-14 SURGERY — ARTHROPLASTY, HIP, TOTAL, ANTERIOR APPROACH
Anesthesia: General | Site: Hip | Laterality: Right

## 2017-07-14 MED ORDER — PHENYLEPHRINE 40 MCG/ML (10ML) SYRINGE FOR IV PUSH (FOR BLOOD PRESSURE SUPPORT)
PREFILLED_SYRINGE | INTRAVENOUS | Status: AC
  Filled 2017-07-14: qty 10

## 2017-07-14 MED ORDER — ONDANSETRON HCL 4 MG/2ML IJ SOLN
INTRAMUSCULAR | Status: DC | PRN
  Administered 2017-07-14: 4 mg via INTRAVENOUS

## 2017-07-14 MED ORDER — OXYCODONE HCL ER 10 MG PO T12A
10.0000 mg | EXTENDED_RELEASE_TABLET | Freq: Two times a day (BID) | ORAL | Status: DC
  Administered 2017-07-14 – 2017-07-15 (×2): 10 mg via ORAL
  Filled 2017-07-14 (×2): qty 1

## 2017-07-14 MED ORDER — HYDROCODONE-ACETAMINOPHEN 5-325 MG PO TABS
1.0000 | ORAL_TABLET | ORAL | Status: DC | PRN
  Administered 2017-07-15: 2 via ORAL
  Filled 2017-07-14: qty 2

## 2017-07-14 MED ORDER — VARENICLINE TARTRATE 1 MG PO TABS
1.0000 mg | ORAL_TABLET | Freq: Two times a day (BID) | ORAL | Status: DC
  Administered 2017-07-14 – 2017-07-15 (×2): 1 mg via ORAL
  Filled 2017-07-14 (×3): qty 1

## 2017-07-14 MED ORDER — OXYCODONE HCL 5 MG PO TABS: 5.0000 mg | ORAL_TABLET | Freq: Once | ORAL | Status: DC | PRN

## 2017-07-14 MED ORDER — SODIUM CHLORIDE 0.9 % IJ SOLN
INTRAMUSCULAR | Status: DC | PRN
  Administered 2017-07-14: 20 mL

## 2017-07-14 MED ORDER — MIDAZOLAM HCL 2 MG/2ML IJ SOLN
INTRAMUSCULAR | Status: AC
Start: 2017-07-14 — End: 2017-07-14
  Filled 2017-07-14: qty 2

## 2017-07-14 MED ORDER — SODIUM CHLORIDE 0.9 % IV SOLN
INTRAVENOUS | Status: DC
  Administered 2017-07-14: 13:00:00 via INTRAVENOUS

## 2017-07-14 MED ORDER — ACETAMINOPHEN 10 MG/ML IV SOLN
INTRAVENOUS | Status: AC
  Filled 2017-07-14: qty 100

## 2017-07-14 MED ORDER — STERILE WATER FOR IRRIGATION IR SOLN
Status: DC | PRN
  Administered 2017-07-14: 2000 mL

## 2017-07-14 MED ORDER — NYSTATIN 100000 UNIT/GM EX POWD
Freq: Two times a day (BID) | CUTANEOUS | Status: DC
  Administered 2017-07-14 – 2017-07-15 (×2): via TOPICAL
  Filled 2017-07-14: qty 15

## 2017-07-14 MED ORDER — PHENOL 1.4 % MT LIQD: 1.0000 | OROMUCOSAL | Status: DC | PRN

## 2017-07-14 MED ORDER — OXYCODONE HCL 5 MG/5ML PO SOLN: 5.0000 mg | Freq: Once | ORAL | Status: DC | PRN

## 2017-07-14 MED ORDER — BUPIVACAINE HCL (PF) 0.25 % IJ SOLN
INTRAMUSCULAR | Status: DC | PRN
  Administered 2017-07-14: 10 mL

## 2017-07-14 MED ORDER — RIVAROXABAN 10 MG PO TABS
20.0000 mg | ORAL_TABLET | Freq: Every day | ORAL | Status: DC
  Administered 2017-07-15: 20 mg via ORAL
  Filled 2017-07-14: qty 2

## 2017-07-14 MED ORDER — SODIUM CHLORIDE 0.9 % IJ SOLN
INTRAMUSCULAR | Status: AC
  Filled 2017-07-14: qty 50

## 2017-07-14 MED ORDER — DEXAMETHASONE SODIUM PHOSPHATE 10 MG/ML IJ SOLN
INTRAMUSCULAR | Status: DC | PRN
Start: 2017-07-14 — End: 2017-07-14
  Administered 2017-07-14: 10 mg via INTRAVENOUS

## 2017-07-14 MED ORDER — LACTATED RINGERS IV SOLN
INTRAVENOUS | Status: DC
  Administered 2017-07-14 (×2): via INTRAVENOUS

## 2017-07-14 MED ORDER — ACETAMINOPHEN 10 MG/ML IV SOLN
INTRAVENOUS | Status: DC | PRN
  Administered 2017-07-14: 1000 mg via INTRAVENOUS

## 2017-07-14 MED ORDER — GABAPENTIN 300 MG PO CAPS
600.0000 mg | ORAL_CAPSULE | Freq: Three times a day (TID) | ORAL | Status: DC
  Administered 2017-07-14 – 2017-07-15 (×4): 600 mg via ORAL
  Filled 2017-07-14 (×4): qty 2

## 2017-07-14 MED ORDER — LIDOCAINE 2% (20 MG/ML) 5 ML SYRINGE
INTRAMUSCULAR | Status: AC
  Filled 2017-07-14: qty 5

## 2017-07-14 MED ORDER — PHENYLEPHRINE HCL 10 MG/ML IJ SOLN
INTRAMUSCULAR | Status: DC | PRN
  Administered 2017-07-14: 50 ug/min via INTRAVENOUS

## 2017-07-14 MED ORDER — HYDROMORPHONE HCL 2 MG/ML IJ SOLN
INTRAMUSCULAR | Status: AC
  Filled 2017-07-14: qty 1

## 2017-07-14 MED ORDER — SUGAMMADEX SODIUM 200 MG/2ML IV SOLN
INTRAVENOUS | Status: DC | PRN
  Administered 2017-07-14: 300 mg via INTRAVENOUS

## 2017-07-14 MED ORDER — CICLOPIROX 8 % EX SOLN: 1.0000 "application " | Freq: Two times a day (BID) | CUTANEOUS | Status: DC

## 2017-07-14 MED ORDER — MIDAZOLAM HCL 2 MG/2ML IJ SOLN
INTRAMUSCULAR | Status: DC | PRN
  Administered 2017-07-14: 2 mg via INTRAVENOUS

## 2017-07-14 MED ORDER — ONDANSETRON HCL 4 MG/2ML IJ SOLN: 4.0000 mg | Freq: Four times a day (QID) | INTRAMUSCULAR | Status: DC | PRN

## 2017-07-14 MED ORDER — ROCURONIUM BROMIDE 10 MG/ML (PF) SYRINGE
PREFILLED_SYRINGE | INTRAVENOUS | Status: DC | PRN
  Administered 2017-07-14: 10 mg via INTRAVENOUS
  Administered 2017-07-14: 50 mg via INTRAVENOUS

## 2017-07-14 MED ORDER — SUGAMMADEX SODIUM 500 MG/5ML IV SOLN
INTRAVENOUS | Status: AC
  Filled 2017-07-14: qty 5

## 2017-07-14 MED ORDER — HYDROMORPHONE HCL 1 MG/ML IJ SOLN
0.2500 mg | INTRAMUSCULAR | Status: DC | PRN
  Administered 2017-07-14 (×4): 0.5 mg via INTRAVENOUS

## 2017-07-14 MED ORDER — ACETAMINOPHEN 325 MG PO TABS: 650.0000 mg | ORAL_TABLET | ORAL | Status: DC | PRN

## 2017-07-14 MED ORDER — KETAMINE HCL 10 MG/ML IJ SOLN
INTRAMUSCULAR | Status: AC
  Filled 2017-07-14: qty 1

## 2017-07-14 MED ORDER — HYDROMORPHONE HCL 1 MG/ML IJ SOLN
INTRAMUSCULAR | Status: AC
  Filled 2017-07-14: qty 2

## 2017-07-14 MED ORDER — HYDROMORPHONE HCL 1 MG/ML IJ SOLN
1.0000 mg | INTRAMUSCULAR | Status: DC | PRN
  Administered 2017-07-14 – 2017-07-15 (×2): 1 mg via INTRAVENOUS
  Filled 2017-07-14 (×2): qty 1

## 2017-07-14 MED ORDER — CEFAZOLIN SODIUM-DEXTROSE 2-4 GM/100ML-% IV SOLN
2.0000 g | Freq: Four times a day (QID) | INTRAVENOUS | Status: AC
  Administered 2017-07-14 (×2): 2 g via INTRAVENOUS
  Filled 2017-07-14 (×2): qty 100

## 2017-07-14 MED ORDER — METOCLOPRAMIDE HCL 5 MG PO TABS: 5.0000 mg | ORAL_TABLET | Freq: Three times a day (TID) | ORAL | Status: DC | PRN

## 2017-07-14 MED ORDER — DEXAMETHASONE SODIUM PHOSPHATE 10 MG/ML IJ SOLN
INTRAMUSCULAR | Status: AC
  Filled 2017-07-14: qty 1

## 2017-07-14 MED ORDER — KETOROLAC TROMETHAMINE 15 MG/ML IJ SOLN
7.5000 mg | Freq: Four times a day (QID) | INTRAMUSCULAR | Status: AC
  Administered 2017-07-14 – 2017-07-15 (×4): 7.5 mg via INTRAVENOUS
  Filled 2017-07-14 (×3): qty 1

## 2017-07-14 MED ORDER — KETAMINE HCL 10 MG/ML IJ SOLN
INTRAMUSCULAR | Status: DC | PRN
  Administered 2017-07-14: 50 mg via INTRAVENOUS
  Administered 2017-07-14: 30 mg via INTRAVENOUS
  Administered 2017-07-14 (×2): 20 mg via INTRAVENOUS

## 2017-07-14 MED ORDER — CHLORHEXIDINE GLUCONATE 4 % EX LIQD: 60.0000 mL | Freq: Once | CUTANEOUS | Status: DC

## 2017-07-14 MED ORDER — 0.9 % SODIUM CHLORIDE (POUR BTL) OPTIME
TOPICAL | Status: DC | PRN
  Administered 2017-07-14: 1000 mL

## 2017-07-14 MED ORDER — FENTANYL CITRATE (PF) 250 MCG/5ML IJ SOLN
INTRAMUSCULAR | Status: AC
  Filled 2017-07-14: qty 5

## 2017-07-14 MED ORDER — PROPOFOL 10 MG/ML IV BOLUS
INTRAVENOUS | Status: DC | PRN
  Administered 2017-07-14: 150 mg via INTRAVENOUS

## 2017-07-14 MED ORDER — LIDOCAINE 2% (20 MG/ML) 5 ML SYRINGE
INTRAMUSCULAR | Status: DC | PRN
  Administered 2017-07-14: 100 mg via INTRAVENOUS

## 2017-07-14 MED ORDER — HYDROMORPHONE HCL 1 MG/ML IJ SOLN
INTRAMUSCULAR | Status: AC
  Filled 2017-07-14: qty 1

## 2017-07-14 MED ORDER — OXYCODONE HCL 5 MG PO TABS
10.0000 mg | ORAL_TABLET | ORAL | Status: DC | PRN
  Administered 2017-07-14 – 2017-07-15 (×4): 20 mg via ORAL
  Filled 2017-07-14 (×4): qty 4

## 2017-07-14 MED ORDER — ONDANSETRON HCL 4 MG PO TABS: 4.0000 mg | ORAL_TABLET | Freq: Four times a day (QID) | ORAL | Status: DC | PRN

## 2017-07-14 MED ORDER — INSULIN ASPART 100 UNIT/ML ~~LOC~~ SOLN
0.0000 [IU] | Freq: Every day | SUBCUTANEOUS | Status: DC
  Administered 2017-07-14: 3 [IU] via SUBCUTANEOUS

## 2017-07-14 MED ORDER — CYCLOBENZAPRINE HCL 10 MG PO TABS
10.0000 mg | ORAL_TABLET | Freq: Three times a day (TID) | ORAL | Status: DC
  Administered 2017-07-14 – 2017-07-15 (×4): 10 mg via ORAL
  Filled 2017-07-14 (×4): qty 1

## 2017-07-14 MED ORDER — PROPOFOL 10 MG/ML IV BOLUS
INTRAVENOUS | Status: AC
  Filled 2017-07-14: qty 20

## 2017-07-14 MED ORDER — SODIUM CHLORIDE 0.9 % IJ SOLN
INTRAMUSCULAR | Status: AC
  Filled 2017-07-14: qty 10

## 2017-07-14 MED ORDER — AMITRIPTYLINE HCL 50 MG PO TABS
50.0000 mg | ORAL_TABLET | Freq: Every day | ORAL | Status: DC
  Administered 2017-07-14: 50 mg via ORAL
  Filled 2017-07-14: qty 1

## 2017-07-14 MED ORDER — PHENYLEPHRINE HCL 10 MG/ML IJ SOLN
INTRAMUSCULAR | Status: AC
  Filled 2017-07-14: qty 1

## 2017-07-14 MED ORDER — SODIUM CHLORIDE 0.9 % IJ SOLN
INTRAMUSCULAR | Status: AC
  Filled 2017-07-14: qty 20

## 2017-07-14 MED ORDER — SUFENTANIL CITRATE 50 MCG/ML IV SOLN
INTRAVENOUS | Status: AC
Start: 2017-07-14 — End: 2017-07-14
  Filled 2017-07-14: qty 1

## 2017-07-14 MED ORDER — BUPIVACAINE LIPOSOME 1.3 % IJ SUSP
20.0000 mL | Freq: Once | INTRAMUSCULAR | Status: AC
  Administered 2017-07-14: 20 mL
  Filled 2017-07-14: qty 20

## 2017-07-14 MED ORDER — DIPHENHYDRAMINE HCL 12.5 MG/5ML PO ELIX: 12.5000 mg | ORAL_SOLUTION | ORAL | Status: DC | PRN

## 2017-07-14 MED ORDER — ROCURONIUM BROMIDE 50 MG/5ML IV SOSY
PREFILLED_SYRINGE | INTRAVENOUS | Status: AC
  Filled 2017-07-14: qty 5

## 2017-07-14 MED ORDER — POLYETHYLENE GLYCOL 3350 17 GM/SCOOP PO POWD: 17.0000 g | Freq: Every day | ORAL | Status: DC | PRN

## 2017-07-14 MED ORDER — DOCUSATE SODIUM 100 MG PO CAPS
100.0000 mg | ORAL_CAPSULE | Freq: Two times a day (BID) | ORAL | Status: DC
  Administered 2017-07-14 – 2017-07-15 (×2): 100 mg via ORAL
  Filled 2017-07-14 (×2): qty 1

## 2017-07-14 MED ORDER — POLYETHYLENE GLYCOL 3350 17 G PO PACK: 17.0000 g | PACK | Freq: Every day | ORAL | Status: DC | PRN

## 2017-07-14 MED ORDER — KETOROLAC TROMETHAMINE 15 MG/ML IJ SOLN
INTRAMUSCULAR | Status: AC
Start: 2017-07-14 — End: 2017-07-15
  Filled 2017-07-14: qty 1

## 2017-07-14 MED ORDER — ONDANSETRON HCL 4 MG/2ML IJ SOLN
INTRAMUSCULAR | Status: AC
  Filled 2017-07-14: qty 2

## 2017-07-14 MED ORDER — PHENYLEPHRINE 40 MCG/ML (10ML) SYRINGE FOR IV PUSH (FOR BLOOD PRESSURE SUPPORT)
PREFILLED_SYRINGE | INTRAVENOUS | Status: DC | PRN
  Administered 2017-07-14 (×5): 80 ug via INTRAVENOUS

## 2017-07-14 MED ORDER — BUPIVACAINE HCL (PF) 0.25 % IJ SOLN
INTRAMUSCULAR | Status: AC
  Filled 2017-07-14: qty 30

## 2017-07-14 MED ORDER — TOLNAFTATE 1 % EX POWD
Freq: Two times a day (BID) | CUTANEOUS | Status: DC
  Filled 2017-07-14 (×2): qty 45

## 2017-07-14 MED ORDER — MENTHOL 3 MG MT LOZG: 1.0000 | LOZENGE | OROMUCOSAL | Status: DC | PRN

## 2017-07-14 MED ORDER — ADULT MULTIVITAMIN W/MINERALS CH
1.0000 | ORAL_TABLET | Freq: Every day | ORAL | Status: DC
  Administered 2017-07-15: 1 via ORAL
  Filled 2017-07-14: qty 1

## 2017-07-14 MED ORDER — ROSUVASTATIN CALCIUM 10 MG PO TABS
10.0000 mg | ORAL_TABLET | Freq: Every day | ORAL | Status: DC
  Administered 2017-07-14: 10 mg via ORAL
  Filled 2017-07-14: qty 1

## 2017-07-14 MED ORDER — HYDROMORPHONE HCL 1 MG/ML IJ SOLN
INTRAMUSCULAR | Status: DC | PRN
  Administered 2017-07-14 (×5): .4 mg via INTRAVENOUS

## 2017-07-14 MED ORDER — SUFENTANIL CITRATE 50 MCG/ML IV SOLN
INTRAVENOUS | Status: DC | PRN
  Administered 2017-07-14 (×2): 10 ug via INTRAVENOUS
  Administered 2017-07-14: 20 ug via INTRAVENOUS
  Administered 2017-07-14 (×3): 10 ug via INTRAVENOUS

## 2017-07-14 MED ORDER — METFORMIN HCL ER 500 MG PO TB24
500.0000 mg | ORAL_TABLET | Freq: Two times a day (BID) | ORAL | Status: DC
  Administered 2017-07-14 – 2017-07-15 (×2): 500 mg via ORAL
  Filled 2017-07-14 (×2): qty 1

## 2017-07-14 MED ORDER — METOCLOPRAMIDE HCL 5 MG/ML IJ SOLN
5.0000 mg | Freq: Three times a day (TID) | INTRAMUSCULAR | Status: DC | PRN
Start: 2017-07-14 — End: 2017-07-15

## 2017-07-14 MED ORDER — ACETAMINOPHEN 650 MG RE SUPP: 650.0000 mg | RECTAL | Status: DC | PRN

## 2017-07-14 MED ORDER — INSULIN ASPART 100 UNIT/ML ~~LOC~~ SOLN
0.0000 [IU] | Freq: Three times a day (TID) | SUBCUTANEOUS | Status: DC
  Administered 2017-07-14: 5 [IU] via SUBCUTANEOUS
  Administered 2017-07-15: 15 [IU] via SUBCUTANEOUS
  Administered 2017-07-15: 8 [IU] via SUBCUTANEOUS

## 2017-07-14 MED ORDER — SODIUM CHLORIDE 0.9 % IR SOLN
Status: DC | PRN
  Administered 2017-07-14: 1000 mL

## 2017-07-14 SURGICAL SUPPLY — 35 items
BAG ZIPLOCK 12X15 (MISCELLANEOUS) ×2 IMPLANT
BLADE SAW SGTL 18X1.27X75 (BLADE) ×2 IMPLANT
CAPT HIP TOTAL 2 ×2 IMPLANT
CELLS DAT CNTRL 66122 CELL SVR (MISCELLANEOUS) IMPLANT
COVER PERINEAL POST (MISCELLANEOUS) ×2 IMPLANT
COVER SURGICAL LIGHT HANDLE (MISCELLANEOUS) ×2 IMPLANT
DRAPE STERI IOBAN 125X83 (DRAPES) ×2 IMPLANT
DRAPE U-SHAPE 47X51 STRL (DRAPES) ×4 IMPLANT
DRESSING AQUACEL AG SP 3.5X10 (GAUZE/BANDAGES/DRESSINGS) ×1 IMPLANT
DRSG AQUACEL AG SP 3.5X10 (GAUZE/BANDAGES/DRESSINGS) ×2
DURAPREP 26ML APPLICATOR (WOUND CARE) ×2 IMPLANT
ELECT REM PT RETURN 15FT ADLT (MISCELLANEOUS) ×2 IMPLANT
GAUZE XEROFORM 1X8 LF (GAUZE/BANDAGES/DRESSINGS) ×2 IMPLANT
GLOVE BIO SURGEON STRL SZ7.5 (GLOVE) ×2 IMPLANT
GLOVE BIOGEL PI IND STRL 7.5 (GLOVE) ×6 IMPLANT
GLOVE BIOGEL PI IND STRL 8 (GLOVE) ×2 IMPLANT
GLOVE BIOGEL PI INDICATOR 7.5 (GLOVE) ×6
GLOVE BIOGEL PI INDICATOR 8 (GLOVE) ×2
GLOVE ECLIPSE 8.0 STRL XLNG CF (GLOVE) ×2 IMPLANT
GLOVE SURG SS PI 7.5 STRL IVOR (GLOVE) ×4 IMPLANT
GOWN SPEC L3 XXLG W/TWL (GOWN DISPOSABLE) ×2 IMPLANT
GOWN STRL REUS W/TWL XL LVL3 (GOWN DISPOSABLE) ×8 IMPLANT
HANDPIECE INTERPULSE COAX TIP (DISPOSABLE) ×1
HOLDER FOLEY CATH W/STRAP (MISCELLANEOUS) ×2 IMPLANT
PACK ANTERIOR HIP CUSTOM (KITS) ×2 IMPLANT
RTRCTR WOUND ALEXIS 18CM MED (MISCELLANEOUS)
SET HNDPC FAN SPRY TIP SCT (DISPOSABLE) ×1 IMPLANT
STAPLER VISISTAT 35W (STAPLE) ×2 IMPLANT
SUT ETHIBOND NAB CT1 #1 30IN (SUTURE) ×2 IMPLANT
SUT VIC AB 0 CT1 36 (SUTURE) ×2 IMPLANT
SUT VIC AB 1 CT1 36 (SUTURE) ×2 IMPLANT
SUT VIC AB 2-0 CT1 27 (SUTURE) ×2
SUT VIC AB 2-0 CT1 TAPERPNT 27 (SUTURE) ×2 IMPLANT
TRAY FOLEY W/METER SILVER 16FR (SET/KITS/TRAYS/PACK) ×2 IMPLANT
YANKAUER SUCT BULB TIP 10FT TU (MISCELLANEOUS) ×2 IMPLANT

## 2017-07-14 NOTE — Anesthesia Preprocedure Evaluation (Signed)
Anesthesia Evaluation  Patient identified by MRN, date of birth, ID band Patient awake    Reviewed: Allergy & Precautions, NPO status , Patient's Chart, lab work & pertinent test results  History of Anesthesia Complications Negative for: history of anesthetic complications  Airway Mallampati: III  TM Distance: >3 FB Neck ROM: Full    Dental  (+) Teeth Intact   Pulmonary sleep apnea , Current Smoker, PE   breath sounds clear to auscultation       Cardiovascular negative cardio ROS   Rhythm:Regular     Neuro/Psych negative neurological ROS  negative psych ROS   GI/Hepatic negative GI ROS, Neg liver ROS,   Endo/Other  diabetes, Type 2Morbid obesity  Renal/GU negative Renal ROS     Musculoskeletal   Abdominal   Peds  Hematology negative hematology ROS (+)   Anesthesia Other Findings   Reproductive/Obstetrics                             Anesthesia Physical Anesthesia Plan  ASA: II  Anesthesia Plan: General   Post-op Pain Management:    Induction: Intravenous  PONV Risk Score and Plan: 2 and Ondansetron and Dexamethasone  Airway Management Planned: Oral ETT  Additional Equipment: None  Intra-op Plan:   Post-operative Plan: Extubation in OR  Informed Consent: I have reviewed the patients History and Physical, chart, labs and discussed the procedure including the risks, benefits and alternatives for the proposed anesthesia with the patient or authorized representative who has indicated his/her understanding and acceptance.   Dental advisory given  Plan Discussed with: CRNA and Surgeon  Anesthesia Plan Comments:         Anesthesia Quick Evaluation

## 2017-07-14 NOTE — Anesthesia Procedure Notes (Signed)
Procedure Name: Intubation Date/Time: 07/14/2017 10:20 AM Performed by: Sharlette Dense, CRNA Patient Re-evaluated:Patient Re-evaluated prior to induction Oxygen Delivery Method: Circle system utilized Preoxygenation: Pre-oxygenation with 100% oxygen Induction Type: IV induction Ventilation: Mask ventilation without difficulty and Oral airway inserted - appropriate to patient size Laryngoscope Size: Sabra Heck and 2 Grade View: Grade I Tube type: Oral Tube size: 7.5 mm Number of attempts: 1 Airway Equipment and Method: Stylet Placement Confirmation: ETT inserted through vocal cords under direct vision,  positive ETCO2 and breath sounds checked- equal and bilateral Secured at: 22 cm Tube secured with: Tape Dental Injury: Teeth and Oropharynx as per pre-operative assessment

## 2017-07-14 NOTE — Transfer of Care (Signed)
Immediate Anesthesia Transfer of Care Note  Patient: Anna Rowe  Procedure(s) Performed: RIGHT TOTAL HIP ARTHROPLASTY ANTERIOR APPROACH (Right Hip)  Patient Location: PACU  Anesthesia Type:General  Level of Consciousness: awake and responds to stimulation  Airway & Oxygen Therapy: Patient Spontanous Breathing and Patient connected to face mask oxygen  Post-op Assessment: Report given to RN and Post -op Vital signs reviewed and stable  Post vital signs: Reviewed and stable  Last Vitals:  Vitals:   07/14/17 0842 07/14/17 1215  BP: (!) 145/109 (!) 136/96  Pulse: 79 (!) 104  Resp: 18 (!) 24  Temp: 36.9 C   SpO2: 97% 93%    Last Pain:  Vitals:   07/14/17 1215  TempSrc:   PainSc: Asleep      Patients Stated Pain Goal: 3 (25/75/05 1833)  Complications: No apparent anesthesia complications

## 2017-07-14 NOTE — H&P (Signed)
TOTAL HIP ADMISSION H&P  Patient is admitted for right total hip arthroplasty.  Subjective:  Chief Complaint: right hip pain  HPI: Anna Rowe, 41 y.o. female, has a history of pain and functional disability in the right hip(s) due to arthritis and patient has failed non-surgical conservative treatments for greater than 12 weeks to include NSAID's and/or analgesics, corticosteriod injections, flexibility and strengthening excercises, supervised PT with diminished ADL's post treatment, use of assistive devices, weight reduction as appropriate and activity modification.  Onset of symptoms was gradual starting 2 years ago with gradually worsening course since that time.The patient noted no past surgery on the right hip(s).  Patient currently rates pain in the right hip at 10 out of 10 with activity. Patient has night pain, worsening of pain with activity and weight bearing, trendelenberg gait, pain that interfers with activities of daily living, pain with passive range of motion and crepitus. Patient has evidence of subchondral cysts, subchondral sclerosis, periarticular osteophytes and joint space narrowing by imaging studies. This condition presents safety issues increasing the risk of falls.  There is no current active infection.  Patient Active Problem List   Diagnosis Date Noted  . Status post total replacement of right hip 07/14/2017  . Status post total replacement of left hip 04/28/2017  . Unilateral primary osteoarthritis, left hip 03/09/2017  . Unilateral primary osteoarthritis, right hip 03/09/2017  . Primary osteoarthritis of right knee 04/04/2016   Past Medical History:  Diagnosis Date  . Arthritis   . Diabetes mellitus ([place])    type 2   . DVT (deep venous thrombosis) ([place]) dx 10-2016   RLE   . HLD (hyperlipidemia)    on [place]or  . Pneumonia    09/2016  . Sleep apnea    per patient ;been dx does not use CPAP does not tolerate    Past Surgical History:   Procedure Laterality Date  . ACHILLES TENDON REPAIR    . HERNIA REPAIR     x2; hiatal and umbilical   . IVC FILTER INSERTION N/A 04/25/2017   Procedure: IVC Filter Insertion;  Surgeon: Serafina Mitchell, MD;  Location: [facility];  Service: Cardiovascular;  Laterality: N/A;  . JOINT REPLACEMENT     right hip 07/14/17 Dr. Ninfa Linden  . KNEE ARTHROSCOPY Right 2015  . plantar    . TOTAL HIP ARTHROPLASTY Left 04/28/2017   Procedure: LEFT TOTAL HIP ARTHROPLASTY ANTERIOR APPROACH;  Surgeon: Mcarthur Rossetti, MD;  Location: [facility];  Service: Orthopedics;  Laterality: Left;  . TOTAL KNEE ARTHROPLASTY Right 04/04/2016   Procedure: TOTAL KNEE ARTHROPLASTY;  Surgeon: Dorna Leitz, MD;  Location: [facility];  Service: Orthopedics;  Laterality: Right;    Current Facility-Administered Medications  Medication Dose Route Frequency Provider Last Rate Last Dose  . ceFAZolin (ANCEF) 3 g in dextrose 5 % 50 mL IVPB  3 g Intravenous On Call to [place], [place], [facility]      . chlorhexidine (HIBICLENS) 4 % liquid 4 application  60 mL Topical Once Pete Pelt, PA-C      . lactated ringers infusion   Intravenous Continuous Oleta Mouse, MD       Allergies  Allergen Reactions  . Methocarbamol Anaphylaxis and Swelling  . Tramadol Anaphylaxis    Tolerates Dilaudid, Oxycontin 04/04/16  . Bee Venom Hives    Social History   Tobacco Use  . Smoking status: Current Every Day Smoker    Packs/day: 0.50    Years: 17.00  Pack years: 8.50    Types: Cigarettes  . Smokeless tobacco: Never Used  . Tobacco comment: 3 ioose cigarettes in last week  Substance Use Topics  . Alcohol use: No    Comment: seldom     History reviewed. No pertinent family history.   Review of Systems  Musculoskeletal: Positive for joint pain.  All other systems reviewed and are negative.   Objective:  Physical Exam  Constitutional: She is oriented to person, place, and time. She appears well-developed and  well-nourished.  HENT:  Head: Normocephalic and atraumatic.  Eyes: EOM are normal. Pupils are equal, round, and reactive to light.  Neck: Normal range of motion. Neck supple.  Cardiovascular: Normal rate and regular rhythm.  Respiratory: Effort normal and breath sounds normal.  GI: Soft. Bowel sounds are normal.  Musculoskeletal:       Right hip: She exhibits decreased range of motion, decreased strength, tenderness and bony tenderness.  Neurological: She is alert and oriented to person, place, and time.  Skin: Skin is warm and dry.  Psychiatric: She has a normal mood and affect.    Vital signs in last 24 hours: Temp:  [98.4 F (36.9 C)] 98.4 F (36.9 C) (11/30 0842) Pulse Rate:  [79] 79 (11/30 0842) Resp:  [18] 18 (11/30 0842) BP: (145)/(109) 145/109 (11/30 0842) SpO2:  [97 %] 97 % (11/30 0842)  Labs:   Estimated body mass index is 41.09 kg/m as calculated from the following:   Height as of 07/04/17: 6' (1.829 m).   Weight as of 07/04/17: 303 lb (137.4 kg).   Imaging Review Plain radiographs demonstrate severe degenerative joint disease of the right hip(s). The bone quality appears to be excellent for age and reported activity level.  Assessment/Plan:  End stage arthritis, right hip(s)  The patient history, physical examination, clinical judgement of the provider and imaging studies are consistent with end stage degenerative joint disease of the right hip(s) and total hip arthroplasty is deemed medically necessary. The treatment options including medical management, injection therapy, arthroscopy and arthroplasty were discussed at length. The risks and benefits of total hip arthroplasty were presented and reviewed. The risks due to aseptic loosening, infection, stiffness, dislocation/subluxation,  thromboembolic complications and other imponderables were discussed.  The patient acknowledged the explanation, agreed to proceed with the plan and consent was signed. Patient is  being admitted for inpatient treatment for surgery, pain control, PT, OT, prophylactic antibiotics, VTE prophylaxis, progressive ambulation and ADL's and discharge planning.The patient is planning to be discharged home with home health services

## 2017-07-14 NOTE — Plan of Care (Signed)
df

## 2017-07-14 NOTE — Anesthesia Postprocedure Evaluation (Signed)
Anesthesia Post Note  Patient: Anna Rowe  Procedure(s) Performed: RIGHT TOTAL HIP ARTHROPLASTY ANTERIOR APPROACH (Right Hip)     Patient location during evaluation: PACU Anesthesia Type: General Level of consciousness: awake and alert Pain management: pain level controlled Vital Signs Assessment: post-procedure vital signs reviewed and stable Respiratory status: spontaneous breathing, nonlabored ventilation, respiratory function stable and patient connected to nasal cannula oxygen Cardiovascular status: blood pressure returned to baseline and stable Postop Assessment: no apparent nausea or vomiting Anesthetic complications: no    Last Vitals:  Vitals:   07/14/17 1616 07/14/17 1724  BP: (!) 143/90 130/81  Pulse: 91 89  Resp: 15 14  Temp: 36.4 C 36.7 C  SpO2: 98% 98%    Last Pain:  Vitals:   07/14/17 1724  TempSrc: Oral  PainSc:                  Anna Rowe

## 2017-07-14 NOTE — Brief Op Note (Signed)
07/14/2017  11:52 AM  PATIENT:  Anna Rowe  41 y.o. female  PRE-OPERATIVE DIAGNOSIS:  osteoarthritis right hip  POST-OPERATIVE DIAGNOSIS:  osteoarthritis right hip  PROCEDURE:  Procedure(s): RIGHT TOTAL HIP ARTHROPLASTY ANTERIOR APPROACH (Right)  SURGEON:  Surgeon(s) and Role:    Mcarthur Rossetti, MD - Primary  PHYSICIAN ASSISTANT: Benita Stabile, PA-C  ANESTHESIA:   general  EBL:  200 mL   COUNTS:  YES  DICTATION: .Other Dictation: Dictation Number [id number]  PLAN OF CARE: Admit to inpatient   PATIENT DISPOSITION:  PACU - hemodynamically stable.   Delay start of Pharmacological VTE agent (>24hrs) due to surgical blood loss or risk of bleeding: no

## 2017-07-14 NOTE — Evaluation (Signed)
Physical Therapy Evaluation Patient Details Name: Anna Rowe MRN: [id number] DOB: 01-15-1976 Today's Date: 07/14/2017   History of Present Illness  Pt s/p R THR and with hx of R TKR (8/17, L THR (9/18) and DM  Clinical Impression  Pt s/p R THR and presents with decreased R LE strength/ROM, post op pain and obesity limiting functional mobility.  Pt should progress to dc home with family assist.    Follow Up Recommendations Home health PT    Equipment Recommendations  None recommended by PT    Recommendations for Other Services       Precautions / Restrictions Precautions Precautions: Fall Restrictions Weight Bearing Restrictions: No Other Position/Activity Restrictions: WBAT      Mobility  Bed Mobility Overal bed mobility: Needs Assistance Bed Mobility: Supine to Sit;Sit to Supine     Supine to sit: Min assist Sit to supine: Min assist   General bed mobility comments: cues for sequence and use of L LE to self assist  Transfers Overall transfer level: Needs assistance Equipment used: Rolling walker (2 wheeled) Transfers: Sit to/from Stand Sit to Stand: Min assist;+2 physical assistance;+2 safety/equipment;From elevated surface         General transfer comment: cues for LE management and use of UEs to self assist  Ambulation/Gait Ambulation/Gait assistance: Min assist;+2 safety/equipment Ambulation Distance (Feet): 140 Feet Assistive device: Rolling walker (2 wheeled) Gait Pattern/deviations: Step-to pattern;Step-through pattern;Decreased step length - right;Decreased step length - left;Shuffle;Trunk flexed Gait velocity: decr Gait velocity interpretation: Below normal speed for age/gender General Gait Details: cues for sequence, posture and position from [employer]            Wheelchair Mobility    Modified Rankin (Stroke Patients Only)       Balance Overall balance assessment: No apparent balance deficits (not formally  assessed)                                           Pertinent Vitals/Pain      Home Living Family/patient expects to be discharged to:: Private residence Living Arrangements: Alone Available Help at Discharge: Family Type of Home: House Home Access: Stairs to enter Entrance Stairs-Rails: Right Entrance Stairs-Number of Steps: 3  Home Layout: One level Home Equipment: Environmental consultant - 2 wheels;Bedside commode;Cane - single point      Prior Function Level of Independence: Independent;Independent with assistive device(s)               Hand Dominance        Extremity/Trunk Assessment   Upper Extremity Assessment Upper Extremity Assessment: Overall WFL for tasks assessed    Lower Extremity Assessment Lower Extremity Assessment: RLE deficits/detail    Cervical / Trunk Assessment Cervical / Trunk Assessment: Normal  Communication   Communication: No difficulties  Cognition Arousal/Alertness: Awake/alert Behavior During Therapy: WFL for tasks assessed/performed Overall Cognitive Status: Within Functional Limits for tasks assessed                                        General Comments      Exercises Total Joint Exercises Ankle Circles/Pumps: AROM;Both;15 reps;Supine   Assessment/Plan    PT Assessment Patient needs continued PT services  PT Problem List Decreased strength;Decreased range of motion;Decreased activity tolerance;Decreased mobility;Decreased knowledge of use of DME;Pain;Obesity  PT Treatment Interventions DME instruction;Gait training;Stair training;Functional mobility training;Therapeutic activities;Therapeutic exercise;Patient/family education    PT Goals (Current goals can be found in the Care Plan section)  Acute Rehab PT Goals Patient Stated Goal: Regain IND and walk with less pain PT Goal Formulation: With patient Time For Goal Achievement: 07/18/17 Potential to Achieve Goals: Good    Frequency  7X/week   Barriers to discharge        Co-evaluation               AM-PAC PT "6 Clicks" Daily Activity  Outcome Measure Difficulty turning over in bed (including adjusting bedclothes, sheets and blankets)?: Unable Difficulty moving from lying on back to sitting on the side of the bed? : Unable Difficulty sitting down on and standing up from a chair with arms (e.g., wheelchair, bedside commode, etc,.)?: Unable Help needed moving to and from a bed to chair (including a wheelchair)?: A Little Help needed walking in hospital room?: A Little Help needed climbing 3-5 steps with a railing? : A Lot 6 Click Score: 11    End of Session Equipment Utilized During Treatment: Gait belt Activity Tolerance: Patient tolerated treatment well Patient left: with call bell/phone within reach;in bed;with family/visitor present Nurse Communication: Mobility status PT Visit Diagnosis: Difficulty in walking, not elsewhere classified (R26.2)    Time: 1710-1736 PT Time Calculation (min) (ACUTE ONLY): 26 min   Charges:   PT Evaluation $PT Eval Low Complexity: 1 Low PT Treatments $Gait Training: 8-22 mins   PT G Codes:        Pg [phone]   Alfard Cochrane 07/14/2017, 6:11 PM

## 2017-07-15 LAB — BASIC METABOLIC PANEL
Anion gap: 7 (ref 5–15)
BUN: 17 mg/dL (ref 6–20)
CO2: 22 mmol/L (ref 22–32)
Calcium: 8.2 mg/dL — ABNORMAL LOW (ref 8.9–10.3)
Chloride: 104 mmol/L (ref 101–111)
Creatinine, Ser: 0.97 mg/dL (ref 0.44–1.00)
GFR calc Af Amer: >60 mL/min (ref >60)
GFR calc non Af Amer: >60 mL/min (ref >60)
Glucose, Bld: 327 mg/dL — ABNORMAL HIGH (ref 65–99)
Potassium: 4.2 mmol/L (ref 3.5–5.1)
Sodium: 133 mmol/L — ABNORMAL LOW (ref 135–145)

## 2017-07-15 LAB — CBC
HCT: 32.6 % — ABNORMAL LOW (ref 36.0–46.0)
Hemoglobin: 10.2 g/dL — ABNORMAL LOW (ref 12.0–15.0)
MCH: 21.9 pg — ABNORMAL LOW (ref 26.0–34.0)
MCHC: 31.3 g/dL (ref 30.0–36.0)
MCV: 70.1 fL — ABNORMAL LOW (ref 78.0–100.0)
Platelets: 328 K/uL (ref 150–400)
RBC: 4.65 MIL/uL (ref 3.87–5.11)
RDW: 16.6 % — ABNORMAL HIGH (ref 11.5–15.5)
WBC: 12.5 K/uL — ABNORMAL HIGH (ref 4.0–10.5)

## 2017-07-15 LAB — GLUCOSE, CAPILLARY
Glucose-Capillary: 269 mg/dL — ABNORMAL HIGH (ref 65–99)
Glucose-Capillary: 381 mg/dL — ABNORMAL HIGH (ref 65–99)

## 2017-07-15 MED ORDER — CICLOPIROX 8 % EX SOLN: 1.0000 "application " | Freq: Two times a day (BID) | CUTANEOUS | Status: DC

## 2017-07-15 MED ORDER — HYDROCODONE-ACETAMINOPHEN 5-325 MG PO TABS: 1.0000 | ORAL_TABLET | ORAL | 0 refills | Status: DC | PRN

## 2017-07-15 NOTE — Discharge Instructions (Signed)

## 2017-07-15 NOTE — Progress Notes (Signed)
Subjective: 1 Day Post-Op Procedure(s) (LRB): RIGHT TOTAL HIP ARTHROPLASTY ANTERIOR APPROACH (Right) Patient reports pain as moderate.    Objective: Vital signs in last 24 hours: Temp:  [97.3 F (36.3 C)-98.8 F (37.1 C)] 97.3 F (36.3 C) (12/01 1000) Pulse Rate:  [85-105] 100 (12/01 1000) Resp:  [13-17] 16 (12/01 1000) BP: (122-159)/(70-99) 159/72 (12/01 1000) SpO2:  [96 %-100 %] 100 % (12/01 1000) Weight:  [303 lb (137.4 kg)] 303 lb (137.4 kg) (11/30 1420)  Intake/Output from previous day: 11/30 0701 - 12/01 0700 In: 4529.8 [P.O.:995; I.V.:3284.8; IV Piggyback:250] Out: 1350 [Urine:1150; Blood:200] Intake/Output this shift: No intake/output data recorded.  Recent Labs    07/15/17 0543  HGB 10.2*   Recent Labs    07/15/17 0543  WBC 12.5*  RBC 4.65  HCT 32.6*  PLT 328   Recent Labs    07/15/17 0543  NA 133*  K 4.2  CL 104  CO2 22  BUN 17  CREATININE 0.97  GLUCOSE 327*  CALCIUM 8.2*   No results for input(s): LABPT, INR in the last 72 hours.  Sensation intact distally Intact pulses distally Dorsiflexion/Plantar flexion intact Incision: dressing C/D/I  Assessment/Plan: 1 Day Post-Op Procedure(s) (LRB): RIGHT TOTAL HIP ARTHROPLASTY ANTERIOR APPROACH (Right) Up with therapy Discharge home with home health this afternoon.  Mcarthur Rossetti 07/15/2017, 1:31 PM

## 2017-07-15 NOTE — Progress Notes (Signed)
Physical Therapy Treatment Patient Details Name: Anna Rowe MRN: [id number] DOB: July 31, 1976 Today's Date: 07/15/2017    History of Present Illness Pt s/p R THR and with hx of R TKR (8/17, L THR (9/18) and DM    PT Comments    Pt ambulated 180' with RW and supervision. Is progressing well with mobility but still needs min A with controlling descent of LE to floor when getting OOB. PT will continue to follow.    Follow Up Recommendations  Home health PT     Equipment Recommendations  None recommended by PT    Recommendations for Other Services       Precautions / Restrictions Precautions Precautions: Fall Restrictions Weight Bearing Restrictions: No Other Position/Activity Restrictions: WBAT    Mobility  Bed Mobility Overal bed mobility: Needs Assistance Bed Mobility: Supine to Sit     Supine to sit: Min assist     General bed mobility comments: min A to RLE to lower to floor as she came to EOB  Transfers Overall transfer level: Needs assistance Equipment used: Rolling walker (2 wheeled) Transfers: Sit to/from Stand Sit to Stand: Supervision;From elevated surface         General transfer comment: bed elevated so hips slightly above knees. safe hand placement with RW  Ambulation/Gait Ambulation/Gait assistance: Supervision Ambulation Distance (Feet): 180 Feet Assistive device: Rolling walker (2 wheeled) Gait Pattern/deviations: Step-to pattern;Step-through pattern;Decreased step length - right;Decreased step length - left;Trunk flexed Gait velocity: decr Gait velocity interpretation: Below normal speed for age/gender General Gait Details: cues for sequence, posture and position from [employer]            Wheelchair Mobility    Modified Rankin (Stroke Patients Only)       Balance Overall balance assessment: No apparent balance deficits (not formally assessed)                                           Cognition Arousal/Alertness: Awake/alert Behavior During Therapy: WFL for tasks assessed/performed Overall Cognitive Status: Within Functional Limits for tasks assessed                                        Exercises Total Joint Exercises Ankle Circles/Pumps: AROM;Both;15 reps;Supine Quad Sets: AROM;Both;10 reps;Supine Gluteal Sets: AROM;Both;10 reps;Supine Short Arc Quad: AROM;Right;10 reps;Supine Heel Slides: AROM;Right;15 reps;Supine Hip ABduction/ADduction: AAROM;Right;10 reps;Supine Long Arc Quad: AROM;Right;10 reps;Seated    General Comments General comments (skin integrity, edema, etc.): pt has urinary urgency due to DM, left in recliner and spoke with RN, Vanita Ingles, about pt transferring independently to [facility] from recliner. Husband present as well      Pertinent Vitals/Pain Pain Assessment: 0-10 Pain Score: 4  Pain Location: Right hip Pain Descriptors / Indicators: Aching Pain Intervention(s): Limited activity within patient's tolerance;Monitored during session;Premedicated before session    Home Living                      Prior Function            PT Goals (current goals can now be found in the care plan section) Acute Rehab PT Goals Patient Stated Goal: Regain IND and walk with less pain PT Goal Formulation: With patient Time For Goal Achievement: 07/18/17 Potential to Achieve Goals:  Good Progress towards PT goals: Progressing toward goals    Frequency    7X/week      PT Plan Current plan remains appropriate    Co-evaluation              AM-PAC PT "6 Clicks" Daily Activity  Outcome Measure  Difficulty turning over in bed (including adjusting bedclothes, sheets and blankets)?: Unable Difficulty moving from lying on back to sitting on the side of the bed? : Unable Difficulty sitting down on and standing up from a chair with arms (e.g., wheelchair, bedside commode, etc,.)?: Unable Help needed moving to and from a bed to  chair (including a wheelchair)?: A Little Help needed walking in hospital room?: A Little Help needed climbing 3-5 steps with a railing? : A Little 6 Click Score: 12    End of Session   Activity Tolerance: Patient tolerated treatment well Patient left: with call bell/phone within reach;with family/visitor present;in chair Nurse Communication: Mobility status PT Visit Diagnosis: Difficulty in walking, not elsewhere classified (R26.2)     Time: 6578-4696 PT Time Calculation (min) (ACUTE ONLY): 37 min  Charges:  $Gait Training: 8-22 mins $Therapeutic Exercise: 8-22 mins                    G Codes:       Leighton Roach, PT  Acute Rehab Services  [place] 07/15/2017, 10:02 AM

## 2017-07-15 NOTE — Progress Notes (Signed)
Physical Therapy Treatment Patient Details Name: Anna Rowe MRN: [id number] DOB: [birth year] Today's Date: 07/15/2017    History of Present Illness Pt s/p R THR and with hx of R TKR (8/17, L THR (9/18) and DM    PT Comments    Pt practiced steps for d/c home, was safe with use of rails and supervision. Pt demo safe use of RW with ambulation and has been performing there ex independently. Prepared for d/c home.    Follow Up Recommendations  Home health PT     Equipment Recommendations  None recommended by PT    Recommendations for Other Services       Precautions / Restrictions Precautions Precautions: Fall Restrictions Weight Bearing Restrictions: No Other Position/Activity Restrictions: WBAT    Mobility  Bed Mobility               General bed mobility comments: pt up in room  Transfers Overall transfer level: Needs assistance Equipment used: Rolling walker (2 wheeled) Transfers: Sit to/from Stand Sit to Stand: From elevated surface;Modified independent (Device/Increase time)         General transfer comment: vc's for controlling descent into sitting  Ambulation/Gait Ambulation/Gait assistance: Supervision Ambulation Distance (Feet): 60 Feet Assistive device: Rolling walker (2 wheeled) Gait Pattern/deviations: Step-to pattern;Step-through pattern;Decreased step length - right;Decreased step length - left;Trunk flexed Gait velocity: decr Gait velocity interpretation: Below normal speed for age/gender General Gait Details: antalgic gait persists, recommend pt consistently use RW for mobilty, pt agreeable   Stairs Stairs: Yes   Stair Management: Two rails;Step to pattern;Forwards Number of Stairs: 5 General stair comments: pt safe with stairs, has rail on one side and wall on other going into home  Wheelchair Mobility    Modified Rankin (Stroke Patients Only)       Balance Overall balance assessment: No apparent balance  deficits (not formally assessed)                                          Cognition Arousal/Alertness: Awake/alert Behavior During Therapy: WFL for tasks assessed/performed Overall Cognitive Status: Within Functional Limits for tasks assessed                                        Exercises      General Comments General comments (skin integrity, edema, etc.): reviewed safety precautions and activity guidelines      Pertinent Vitals/Pain Pain Assessment: 0-10 Pain Score: 6  Pain Location: Right hip Pain Descriptors / Indicators: Aching Pain Intervention(s): Limited activity within patient's tolerance;Patient requesting pain meds-RN notified    Home Living                      Prior Function            PT Goals (current goals can now be found in the care plan section) Acute Rehab PT Goals Patient Stated Goal: Regain IND and walk with less pain PT Goal Formulation: With patient Time For Goal Achievement: 07/18/17 Potential to Achieve Goals: Good Progress towards PT goals: Progressing toward goals    Frequency    7X/week      PT Plan Current plan remains appropriate    Co-evaluation              AM-PAC  PT "6 Clicks" Daily Activity  Outcome Measure  Difficulty turning over in bed (including adjusting bedclothes, sheets and blankets)?: Unable Difficulty moving from lying on back to sitting on the side of the bed? : Unable Difficulty sitting down on and standing up from a chair with arms (e.g., wheelchair, bedside commode, etc,.)?: Unable Help needed moving to and from a bed to chair (including a wheelchair)?: A Little Help needed walking in hospital room?: A Little Help needed climbing 3-5 steps with a railing? : A Little 6 Click Score: 12    End of Session   Activity Tolerance: Patient tolerated treatment well Patient left: with call bell/phone within reach;with family/visitor present;in chair Nurse  Communication: Mobility status PT Visit Diagnosis: Difficulty in walking, not elsewhere classified (R26.2)     Time: 1761-6073 PT Time Calculation (min) (ACUTE ONLY): 14 min  Charges:  $Gait Training: 8-22 mins                    G Codes:       Leighton Roach, PT  Acute Rehab Services  [place] 07/15/2017, 3:34 PM

## 2017-07-15 NOTE — Discharge Summary (Signed)
Patient ID: Anna Rowe MRN: [id number] DOB/AGE: [birth year] 41 y.o.  Admit date: 07/14/2017 Discharge date: 07/15/2017  Admission Diagnoses:  Principal Problem:   Status post total replacement of right hip   Discharge Diagnoses:  Same  Past Medical History:  Diagnosis Date  . Arthritis   . Diabetes mellitus ([place])    type 2   . DVT (deep venous thrombosis) ([place]) dx 10-2016   RLE   . HLD (hyperlipidemia)    on crestor  . Pneumonia    09/2016  . Sleep apnea    per patient ;been dx does not use CPAP does not tolerate    Surgeries: Procedure(s): RIGHT TOTAL HIP ARTHROPLASTY ANTERIOR APPROACH on 07/14/2017   Consultants:   Discharged Condition: Improved  Hospital Course: Anna Rowe is an 41 y.o. female who was admitted 07/14/2017 for operative treatment ofStatus post total replacement of right hip. Patient has severe unremitting pain that affects sleep, daily activities, and work/hobbies. After pre-op clearance the patient was taken to the operating room on 07/14/2017 and underwent  Procedure(s): RIGHT TOTAL HIP ARTHROPLASTY ANTERIOR APPROACH.    Patient was given perioperative antibiotics:  Anti-infectives (From admission, onward)   Start     Dose/Rate Route Frequency Ordered Stop   07/14/17 1630  ceFAZolin (ANCEF) IVPB 2g/100 mL premix     2 g 200 mL/hr over 30 Minutes Intravenous Every 6 hours 07/14/17 1428 07/15/17 0032   07/14/17 0600  ceFAZolin (ANCEF) 3 g in dextrose 5 % 50 mL IVPB     3 g 130 mL/hr over 30 Minutes Intravenous On call to O.R. 07/13/17 1502 07/14/17 1032       Patient was given sequential compression devices, early ambulation, and chemoprophylaxis to prevent DVT.  Patient benefited maximally from hospital stay and there were no complications.    Recent vital signs:  Patient Vitals for the past 24 hrs:  BP Temp Temp src Pulse Resp SpO2 Height Weight  07/15/17 1000 (!) 159/72 (!) 97.3 F (36.3 C) - 100 16  100 % - -  07/15/17 0520 126/70 98.2 F (36.8 C) Oral (!) 105 16 97 % - -  07/15/17 0100 122/72 98.8 F (37.1 C) Oral 95 17 96 % - -  07/14/17 2102 (!) 144/94 98.5 F (36.9 C) Oral 100 16 99 % - -  07/14/17 1724 130/81 98 F (36.7 C) Oral 89 14 98 % - -  07/14/17 1616 (!) 143/90 97.6 F (36.4 C) Oral 91 15 98 % - -  07/14/17 1515 (!) 130/92 97.7 F (36.5 C) Oral 85 14 97 % - -  07/14/17 1420 (!) 141/88 97.6 F (36.4 C) Oral 93 14 96 % 6' (1.829 m) (!) 303 lb (137.4 kg)  07/14/17 1417 (!) 141/88 97.6 F (36.4 C) - 97 14 96 % - -  07/14/17 1345 (!) 152/99 98.5 F (36.9 C) - 92 13 96 % - -     Recent laboratory studies:  Recent Labs    07/15/17 0543  WBC 12.5*  HGB 10.2*  HCT 32.6*  PLT 328  NA 133*  K 4.2  CL 104  CO2 22  BUN 17  CREATININE 0.97  GLUCOSE 327*  CALCIUM 8.2*     Discharge Medications:   Allergies as of 07/15/2017      Reactions   Methocarbamol Anaphylaxis, Swelling   Tramadol Anaphylaxis   Tolerates Dilaudid, Oxycontin 04/04/16   Bee Venom Hives      Medication List  TAKE these medications   amitriptyline 50 MG tablet Commonly known as:  ELAVIL Take 50 mg by mouth at bedtime.   CHANTIX CONTINUING MONTH PAK 1 MG tablet Generic drug:  varenicline Take 1 mg by mouth 2 (two) times daily.   ciclopirox 8 % solution Commonly known as:  PENLAC Apply 1 application topically 2 (two) times daily.   clotrimazole-betamethasone cream Commonly known as:  LOTRISONE Apply 1 application topically 2 (two) times daily.   cyclobenzaprine 10 MG tablet Commonly known as:  FLEXERIL Take 10 mg by mouth 3 (three) times daily.   docusate sodium 100 MG capsule Commonly known as:  COLACE Take 100 mg by mouth 2 (two) times daily.   gabapentin 600 MG tablet Commonly known as:  NEURONTIN Take 600 mg by mouth 3 (three) times daily.   HYDROcodone-acetaminophen 5-325 MG tablet Commonly known as:  NORCO Take 1-2 tablets by mouth every 4 (four) hours as  needed for moderate pain.   insulin regular 100 units/mL injection Commonly known as:  NOVOLIN R,HUMULIN R Inject 5-10 Units into the skin daily as needed for high blood sugar (depends on blood sugar readings if takes  5-10 units).   medroxyPROGESTERone 150 MG/ML injection Commonly known as:  DEPO-PROVERA Inject 150 mg every 3 (three) months into the muscle. Last dose 06/28/2018   meloxicam 15 MG tablet Commonly known as:  MOBIC Take 15 mg by mouth daily.   metFORMIN 500 MG 24 hr tablet Commonly known as:  GLUCOPHAGE-XR Take 500 mg 2 (two) times daily by mouth.   multivitamin with minerals Tabs tablet Take 1 tablet by mouth daily.   ONETOUCH VERIO test strip Generic drug:  glucose blood 3 (three) times daily. for testing   OXYCONTIN 10 mg 12 hr tablet Generic drug:  oxyCODONE Take 10 mg by mouth every 12 (twelve) hours.   Oxycodone HCl 10 MG Tabs Take 10 mg by mouth every 8 (eight) hours. for pain   polyethylene glycol powder powder Commonly known as:  GLYCOLAX/MIRALAX Take 17 g daily as needed by mouth for mild constipation. Averages once a month   rosuvastatin 10 MG tablet Commonly known as:  CRESTOR Take 10 mg by mouth at bedtime.   Vitamin D (Ergocalciferol) 50000 units Caps capsule Commonly known as:  DRISDOL Take 50,000 Units every 7 (seven) days by mouth. Sunday   XARELTO 20 MG Tabs tablet Generic drug:  rivaroxaban Take 20 mg by mouth daily.            Durable Medical Equipment  (From admission, onward)        Start     Ordered   07/14/17 1429  DME 3 n 1  Once     11 /30/18 1428   07/14/17 1429  DME Walker rolling  Once    Question:  Patient needs a walker to treat with the following condition  Answer:  Status post total replacement of right hip   07/14/17 1428      Diagnostic Studies: Dg Pelvis Portable  Result Date: 07/14/2017 CLINICAL DATA:  Osteoarthritis of the right hip. Status post right total hip replacement. EXAM: PORTABLE PELVIS  1-2 VIEWS COMPARISON:  Radiograph dated 04/28/2017 FINDINGS: AP views of the pelvis demonstrate that the acetabular and femoral components of the right total hip prosthesis appear in good position. No fractures. Left total hip prosthesis also appears in good position. IMPRESSION: Satisfactory appearance of the right hip in the AP projection after total hip prosthesis insertion. Electronically Signed  By: Lorriane Shire M.D.   On: 07/14/2017 12:54   Dg C-arm 61-120 Min-no Report  Result Date: 07/14/2017 Fluoroscopy was utilized by the requesting physician.  No radiographic interpretation.   Dg Hip Operative Unilat W Or W/o Pelvis Right  Result Date: 07/14/2017 CLINICAL DATA:  Right hip replacement. EXAM: OPERATIVE RIGHT HIP TECHNIQUE: Fluoroscopic spot image(s) were submitted for interpretation post-operatively. COMPARISON:  04/28/2017 FINDINGS: A right total hip arthroplasty has been performed. The entire right femoral stem is visualized without complicating features. Patient also has a left hip replacement. IMPRESSION: Right hip arthroplasty.  No complicating features. Electronically Signed   By: Markus Daft M.D.   On: 07/14/2017 11:59    Disposition: 01-Home or Self Care  Discharge Instructions    Discharge patient   Complete by:  As directed    Discharge disposition:  01-Home or Self Care   Discharge patient date:  07/15/2017      Follow-up Information    Mcarthur Rossetti, MD Follow up in 2 week(s).   Specialty:  Orthopedic Surgery Contact information: [place] [address] [phone]            Signed: Mcarthur Rossetti 07/15/2017, 1:34 PM

## 2017-07-15 NOTE — Progress Notes (Signed)
Pt discharged to home. DC instructions given. No concerns voiced. Prescription x 1 given for pain med. Left unit in wheelchair pushed by nurse tecn accompanied by husband. Left in stable condition. Hale Bogus.

## 2017-07-15 NOTE — Progress Notes (Signed)
OT Cancellation Note  Patient Details Name: Anna Rowe MRN: [id number] DOB: 25-Feb-1976   Cancelled Treatment:    Reason Eval/Treat Not Completed: OT screened, no needs identified, will sign off. Spoke with patient. She reports having OT services after 2 recent joint replacements and is familiar with all ADL techniques. Patient reports she has all needed DME. Patient denies need for OT intervention at this time. Will sign off.  Khole Arterburn A Kentavius [place] 07/15/2017, 11:01 AM

## 2017-07-16 NOTE — Care Management Note (Signed)
Case Management Note  Patient Details  Name: Anna Rowe MRN: [id number] Date of Birth: Dec 27, 1975  Subjective/Objective:    S/p R THA               Action/Plan: Discharge Planning: 07/15/2017 NCM spoke to pt and offered choice for [facility]. Pt agreeable to [facility] for [facility]. Pt has RW and bedside commode at home. Husband at home to assist with care.   PCP [place]    Expected Discharge Date:  07/15/17               Expected Discharge Plan:  [place]  In-House Referral:  NA  Discharge planning Services  CM Consult  Post Acute Care Choice:  Home Health Choice offered to:  Patient  DME Arranged:  N/A DME Agency:  NA  HH Arranged:  PT [place] Agency:  [facility] ([facility]  Status of Service:  Completed, signed off  If discussed at [employer], dates discussed:    Additional Comments:  Erenest Rasher, RN 07/16/2017, 5:06 PM

## 2017-07-17 ENCOUNTER — Encounter (HOSPITAL_COMMUNITY): Payer: Self-pay | Admitting: Orthopaedic Surgery

## 2017-07-17 NOTE — Op Note (Signed)
NAME:  Anna Rowe, Anna Rowe             ACCOUNT NO.:  MEDICAL RECORD NO.:  [id number]  LOCATION:                                 FACILITY:  PHYSICIAN:  Lind Guest. Ninfa Linden, M.D.DATE OF BIRTH:  02/17/1976  DATE OF PROCEDURE:  07/14/2017 DATE OF DISCHARGE:                              OPERATIVE REPORT   PREOPERATIVE DIAGNOSIS:  Primary osteoarthritis and degenerative joint disease, right hip.  POSTOPERATIVE DIAGNOSIS:  Primary osteoarthritis and degenerative joint disease, right hip.  PROCEDURE:  Right total hip arthroplasty through direct anterior approach.  IMPLANTS:  DePuy Sector Gription acetabular component size 52, size 36 +0 neutral polyethylene liner, size 11 Corail femoral component with varus offset, size 36 +1.5 ceramic hip ball.  SURGEON:  Lind Guest. Ninfa Linden, M.D.  ASSISTANT:  Erskine Emery, PA-C.  ANESTHESIA: 1. General. 2. Local with mixture of Exparel and 0.25% plain Marcaine.  ANTIBIOTICS:  IV Ancef 3 g.  BLOOD LOSS:  200 mL.  COMPLICATIONS:  None.  INDICATIONS:  Anna Rowe is a 41 year old female, well known to me. She has bilateral hip severe osteoarthritis and degenerative joint disease.  In September of this year, she underwent a successful left total hip arthroplasty and now wishes to have this done on the right side.  She does have daily pain in the right hip and severe arthritis verified on x-ray and clinical exam.  She understands the risk of acute blood loss anemia, nerve and vessel injury, fracture, infection, dislocation, DVT.  She understands her goals are to decrease pain, improve mobility, and overall improved quality of life.  PROCEDURE DESCRIPTION:  After informed consent was obtained, appropriate right hip was marked.  She was brought to the operating room, where general anesthesia was obtained while she was on her stretcher. Traction boots were placed on both her feet.  Next, she was placed supine on the [place] fracture  table, the perineal post in place, and both legs in InLine skeletal traction devices, no traction applied.  Her right operative hip was prepped and draped with DuraPrep and sterile drapes.  Time-out was called and she was identified as correct patient and correct right hip.  We then made an incision just inferior and posterior to the anterior superior iliac spine and carried this obliquely down the leg.  We dissected down to soft tissues to the tensor fascia lata and the tensor fascia was then divided longitudinally to proceed with a direct anterior approach to the hip.  We identified and cauterized the circumflex vessels, then identified the hip capsule.  I opened up the hip capsule finding a moderate joint effusion and significant periarticular osteophytes.  We placed Cobra retractors around the medial and lateral femoral neck and then made our femoral neck cut with an oscillating saw proximal to the lesser trochanter and completed this on osteotome.  I placed a corkscrew guide in the femoral head and removed the femoral head in its entirety and found it to be devoid of cartilage.  I then removed remnants of acetabular labrum and other debris from the acetabulum.  We placed a bent Hohmann to the medial acetabular rim and then began reaming from a size 42 reamer in stepwise increments up to  a size 52 with all reamers under direct visualization, the last reamer under direct fluoroscopy, so we could obtain our depth of reaming, our inclination, and anteversion.  Once we were pleased with this, we placed the real DePuy Sector Gription acetabular component size 52, and a single screw, and a 36 +0 polyethylene liner for that size acetabular component.  Attention was then turned to the femur.  With the leg externally rotated to 120 degrees, extended, and adducted, we were able to place a Mueller retractor medially and a Hohmann retractor behind the greater trochanter.  We released the lateral  joint capsule and used a box cutting osteotome to enter the inner femoral canal and a rongeur to lateralize.  We then began broaching from a size 8 broach using the Corail broaching system going up to a size 11.  With a size 11 in place, we trialed a varus offset femoral neck based on her anatomy and her other side and placed a 36 +1.5 trial hip ball.  We brought the leg back over and up with traction and rotation reducing the pelvis, and we were pleased with leg length, offset, stability, and range of motion.  We then dislocated the hip and removed the trial components.  We were able to place the real Corail femoral component size 11 with varus offset and real 36 +1.5 ceramic hip ball.  Again, we reduced in the acetabulum and I was pleased with stability.  We then irrigated the soft tissue with normal saline solution using pulsatile lavage.  There was no joint capsule closed.  We closed the tensor fascia with a running #1 Vicryl suture, followed by 0 Vicryl in the deep tissue, 2-0 Vicryl in subcutaneous tissue, interrupted staples on the skin.  Xeroform and Aquacel dressing were applied.  She was taken off the Hana table and taken to the recovery room in stable condition.  All final counts were correct.  There were no complications noted.     Lind Guest. Ninfa Linden, M.D.     CYB/MEDQ  D:  07/14/2017  T:  07/15/2017  Job:  [id number]

## 2017-07-27 ENCOUNTER — Encounter (INDEPENDENT_AMBULATORY_CARE_PROVIDER_SITE_OTHER): Payer: Self-pay | Admitting: Orthopaedic Surgery

## 2017-07-27 ENCOUNTER — Ambulatory Visit (INDEPENDENT_AMBULATORY_CARE_PROVIDER_SITE_OTHER): Payer: 59 | Admitting: Orthopaedic Surgery

## 2017-07-27 DIAGNOSIS — Z96641 Presence of right artificial hip joint: Secondary | ICD-10-CM

## 2017-07-27 MED ORDER — HYDROCODONE-ACETAMINOPHEN 5-325 MG PO TABS: 1.0000 | ORAL_TABLET | Freq: Four times a day (QID) | ORAL | 0 refills | Status: DC | PRN

## 2017-07-27 NOTE — Progress Notes (Signed)
The patient is 2 weeks status post a right total hip arthroplasty through direct anterior approach.  She is doing well overall.  On exam are stable and looks good to remove staples apply Steri-Strips.  She seems to be doing okay but she says this is been a little bit rougher than her other hip.  I am rotating around fine though her leg lengths are equal.  At this point she will continue to increase her activities.  I did give her prescription for hydrocodone.  When we see her back next month no x-rays are needed of her hip but we would like an AP and lateral of both knees.

## 2017-08-24 ENCOUNTER — Ambulatory Visit (INDEPENDENT_AMBULATORY_CARE_PROVIDER_SITE_OTHER): Payer: 59

## 2017-08-24 ENCOUNTER — Ambulatory Visit (INDEPENDENT_AMBULATORY_CARE_PROVIDER_SITE_OTHER): Payer: 59 | Admitting: Orthopaedic Surgery

## 2017-08-24 ENCOUNTER — Encounter (INDEPENDENT_AMBULATORY_CARE_PROVIDER_SITE_OTHER): Payer: Self-pay | Admitting: Orthopaedic Surgery

## 2017-08-24 DIAGNOSIS — M25562 Pain in left knee: Secondary | ICD-10-CM

## 2017-08-24 DIAGNOSIS — M545 Low back pain, unspecified: Secondary | ICD-10-CM | POA: Insufficient documentation

## 2017-08-24 DIAGNOSIS — M25561 Pain in right knee: Secondary | ICD-10-CM | POA: Diagnosis not present

## 2017-08-24 DIAGNOSIS — G8929 Other chronic pain: Secondary | ICD-10-CM | POA: Diagnosis not present

## 2017-08-24 DIAGNOSIS — M1712 Unilateral primary osteoarthritis, left knee: Secondary | ICD-10-CM | POA: Insufficient documentation

## 2017-08-24 DIAGNOSIS — Z96651 Presence of right artificial knee joint: Secondary | ICD-10-CM | POA: Insufficient documentation

## 2017-08-24 NOTE — Progress Notes (Signed)
Office Visit Note   Patient: Anna Rowe           Date of Birth: 06/06/1976           MRN: [id number] Visit Date: 08/24/2017              Requested by: [facility], [facility] [address] [place], [place] [address] PCP: [place]: Visit Diagnoses:  1. Chronic pain of both knees   2. Chronic bilateral low back pain, with sciatica presence unspecified   3. Unilateral primary osteoarthritis, left knee   4. History of total right knee replacement     Plan: She understands fully that painful total knee arthroplasties are difficult to treat.  Her knee hurts with and without activities.  Certainly she may need upsizing of a polyliner.  This knee was replaced 2 years ago by someone else.  I am concerned about the severity of her pain and I told her that sometimes even more surgery on a painful total knee arthroplasty does not treat pain adequately.  I would like her to be off of pain medications at some point and would be nice to be able to been get a better exam on her knee because she is very tearful during the exam with him trying to manipulate the knee to feel its stability versus instability.  As far as her hip goes she is doing well and hopefully as she is getting over the hip replacement surgeries her postural get better with her back pain and still help her knees as well.  At some point she may need a left total knee arthroplasty but I be hesitant to do that until she feels better from her right knee.  We will see her back in 2 months to see how she is doing overall but no x-rays are needed.  Although the patient is in postop follow-up 6 weeks status post right total knee arthroplasty for seeing her  Follow-Up Instructions: Return in about 2 months (around 10/22/2017).   Orders:  Orders Placed This Encounter  Procedures  . XR Knee 1-2 Views Left  . XR Knee 1-2 Views Right  . XR Lumbar Spine 2-3 Views   No orders of the defined  types were placed in this encounter.     Procedures: No procedures performed   Clinical Data: No additional findings.   Subjective: Chief Complaint  Patient presents with  . Right Hip - Routine Post Op  Both her knees today and her back.  Her right knee was replaced 2 years ago by another Psychologist, sport and exercise in town is been painful ever since surgery.  She feels like the knee has global pain and is hurt significantly with motion and not motion even when she is weightbearing and nonweightbearing.  Her left knee is been bothering her with a lot of clicking in the left knee.  Her back from hurting a close aspect of her lumbar spine.  She denies any radicular symptoms.  She is ambulate with a rolling walker.  She currently denies any headache, chest pain, shortness of breath, fever, chills, nausea, vomiting.  HPI  Review of Systems   Objective: Vital Signs: There were no vitals taken for this visit.  Physical Exam She is alert and oriented x3 and in no acute distress Ortho Exam Examination of her lumbar spine shows pain in the lowest aspect of the lumbar spine mostly in the facet joints.  Her left knee shows good  range of motion and is ligamentously stable with patellofemoral crepitation.  Both hips are moving more fluidly.  Her right knee has pain with any attempts of stressing the knee and range of motion the knee and this brings her to tears and she grabs at her knee when I try to move it.  There is no evidence of infection.  I cannot get a good ligamentous exam on the need to fill if it is ligamentously stable or not but it does not feel significantly unstable and she does not describe symptoms of instability. Specialty Comments:  No specialty comments available.  Imaging: Xr Knee 1-2 Views Left  Result Date: 08/24/2017 2 views of the left knee show moderate arthritic changes.  There is severe arthritis of patellofemoral joint and periarticular osteophytes in all 3 compartments.  Xr Knee  1-2 Views Right  Result Date: 08/24/2017 2 views of the right knee show a total knee arthroplasty with no effusion.  There is no evidence of ostial lysis or malalignment.  There is no evidence of loosening.  Xr Lumbar Spine 2-3 Views  Result Date: 08/24/2017 2 views of the lumbar spine show a normal-appearing spine with no acute findings.  The disc heights are well-maintained and the alignment is well-maintained.    PMFS History: Patient Active Problem List   Diagnosis Date Noted  . Unilateral primary osteoarthritis, left knee 08/24/2017  . History of total right knee replacement 08/24/2017  . Chronic bilateral low back pain 08/24/2017  . Status post total replacement of right hip 07/14/2017  . Status post total replacement of left hip 04/28/2017  . Unilateral primary osteoarthritis, left hip 03/09/2017  . Unilateral primary osteoarthritis, right hip 03/09/2017  . Primary osteoarthritis of right knee 04/04/2016   Past Medical History:  Diagnosis Date  . Arthritis   . Diabetes mellitus ([place])    type 2   . DVT (deep venous thrombosis) ([place]) dx 10-2016   RLE   . HLD (hyperlipidemia)    on crestor  . Pneumonia    09/2016  . Sleep apnea    per patient ;been dx does not use CPAP does not tolerate    History reviewed. No pertinent family history.  Past Surgical History:  Procedure Laterality Date  . ACHILLES TENDON REPAIR    . HERNIA REPAIR     x2; hiatal and umbilical   . IVC FILTER INSERTION N/A 04/25/2017   Procedure: IVC Filter Insertion;  Surgeon: Serafina Mitchell, MD;  Location: [facility];  Service: Cardiovascular;  Laterality: N/A;  . JOINT REPLACEMENT     right hip 07/14/17 Dr. Ninfa Linden  . KNEE ARTHROSCOPY Right 2015  . plantar    . TOTAL HIP ARTHROPLASTY Left 04/28/2017   Procedure: LEFT TOTAL HIP ARTHROPLASTY ANTERIOR APPROACH;  Surgeon: Mcarthur Rossetti, MD;  Location: [facility];  Service: Orthopedics;  Laterality: Left;  . TOTAL HIP ARTHROPLASTY Right  07/14/2017   Procedure: RIGHT TOTAL HIP ARTHROPLASTY ANTERIOR APPROACH;  Surgeon: Mcarthur Rossetti, MD;  Location: [facility];  Service: Orthopedics;  Laterality: Right;  . TOTAL KNEE ARTHROPLASTY Right 04/04/2016   Procedure: TOTAL KNEE ARTHROPLASTY;  Surgeon: Dorna Leitz, MD;  Location: [facility];  Service: Orthopedics;  Laterality: Right;   Social History   Occupational History  . Not on file  Tobacco Use  . Smoking status: Current Every Day Smoker    Packs/day: 0.50    Years: 17.00    Pack years: 8.50    Types: Cigarettes  .  Smokeless tobacco: Never Used  . Tobacco comment: 3 ioose cigarettes in last week  Substance and Sexual Activity  . Alcohol use: No    Comment: seldom   . Drug use: No  . Sexual activity: Not Currently

## 2017-08-29 ENCOUNTER — Telehealth (INDEPENDENT_AMBULATORY_CARE_PROVIDER_SITE_OTHER): Payer: Self-pay | Admitting: Orthopaedic Surgery

## 2017-08-29 NOTE — Telephone Encounter (Signed)
No ma'am

## 2017-08-29 NOTE — Telephone Encounter (Signed)
Do you know if patient left a knee brace here at her last visit? 08/24/2017. I did not see it in our lost and found. CB if found # [phone]

## 2017-08-30 ENCOUNTER — Telehealth (INDEPENDENT_AMBULATORY_CARE_PROVIDER_SITE_OTHER): Payer: Self-pay | Admitting: Orthopaedic Surgery

## 2017-08-30 NOTE — Telephone Encounter (Signed)
Received VM from Byron @ [facility] checking on their request for records. I called back lmvm advising CIOX processed on 08/24/17 and I would also fax the requested records

## 2017-10-12 ENCOUNTER — Other Ambulatory Visit: Payer: Self-pay

## 2017-10-12 DIAGNOSIS — I824Y9 Acute embolism and thrombosis of unspecified deep veins of unspecified proximal lower extremity: Secondary | ICD-10-CM

## 2017-10-23 ENCOUNTER — Encounter: Payer: Self-pay | Admitting: *Deleted

## 2017-10-23 ENCOUNTER — Other Ambulatory Visit: Payer: Self-pay | Admitting: *Deleted

## 2017-10-23 ENCOUNTER — Ambulatory Visit (INDEPENDENT_AMBULATORY_CARE_PROVIDER_SITE_OTHER): Payer: 59 | Admitting: Surgery

## 2017-10-23 ENCOUNTER — Ambulatory Visit (HOSPITAL_COMMUNITY)
Admission: RE | Admit: 2017-10-23 | Discharge: 2017-10-23 | Disposition: A | Discharge status: Home / Self Care | Payer: 59 | Source: Ambulatory Visit | Attending: Surgery | Admitting: Surgery

## 2017-10-23 ENCOUNTER — Encounter: Payer: Self-pay | Admitting: Surgery

## 2017-10-23 ENCOUNTER — Other Ambulatory Visit: Payer: Self-pay

## 2017-10-23 VITALS — BP 162/93 | HR 99 | Temp 98.4°F | Resp 20 | Ht 72.0 in | Wt 317.0 lb

## 2017-10-23 DIAGNOSIS — I824Y9 Acute embolism and thrombosis of unspecified deep veins of unspecified proximal lower extremity: Secondary | ICD-10-CM | POA: Diagnosis not present

## 2017-10-23 DIAGNOSIS — I82431 Acute embolism and thrombosis of right popliteal vein: Secondary | ICD-10-CM

## 2017-10-23 NOTE — Progress Notes (Signed)
Vascular and Vein Specialist of [facility]  Patient name: Anna Rowe MRN: [id number] DOB: 11-30-1975 Sex: female   REASON FOR VISIT:    Follow up  HISOTRY OF PRESENT ILLNESS:    Anna Rowe is a 42 y.o. female returns today for follow-up of her IVC filter which was placed on 04/25/2017 prior to hip surgery.  She initially had a right popliteal DVT in March 2018.  She continues to take Xarelto.   PAST MEDICAL HISTORY:   Past Medical History:  Diagnosis Date  . Arthritis   . Diabetes mellitus ([place])    type 2   . DVT (deep venous thrombosis) ([place]) dx 10-2016   RLE   . HLD (hyperlipidemia)    on crestor  . Pneumonia    09/2016  . Sleep apnea    per patient ;been dx does not use CPAP does not tolerate     FAMILY HISTORY:   No family history on file.  SOCIAL HISTORY:   Social History   Tobacco Use  . Smoking status: Current Every Day Smoker    Packs/day: 0.50    Years: 17.00    Pack years: 8.50    Types: Cigarettes  . Smokeless tobacco: Never Used  . Tobacco comment: 3 Loose cigarettes in last week  Substance Use Topics  . Alcohol use: No    Comment: seldom      ALLERGIES:   Allergies  Allergen Reactions  . Methocarbamol Anaphylaxis and Swelling  . Tramadol Anaphylaxis    Tolerates Dilaudid, Oxycontin 04/04/16  . Bee Venom Hives     CURRENT MEDICATIONS:   Current Outpatient Medications  Medication Sig Dispense Refill  . amitriptyline (ELAVIL) 50 MG tablet Take 50 mg by mouth at bedtime.  2  . CHANTIX CONTINUING MONTH PAK 1 MG tablet Take 1 mg by mouth 2 (two) times daily.  4  . ciclopirox (PENLAC) 8 % solution Apply 1 application topically 2 (two) times daily.   6  . clotrimazole-betamethasone (LOTRISONE) cream Apply 1 application topically 2 (two) times daily.  3  . cyclobenzaprine (FLEXERIL) 10 MG tablet Take 10 mg by mouth 3 (three) times daily.  1  . docusate sodium (COLACE)  100 MG capsule Take 100 mg by mouth 2 (two) times daily.   6  . gabapentin (NEURONTIN) 600 MG tablet Take 600 mg by mouth 3 (three) times daily.  3  . insulin regular (NOVOLIN R,HUMULIN R) 100 units/mL injection Inject 5-10 Units into the skin daily as needed for high blood sugar (depends on blood sugar readings if takes  5-10 units).     . medroxyPROGESTERone (DEPO-PROVERA) 150 MG/ML injection Inject 150 mg every 3 (three) months into the muscle. Last dose 06/28/2018  3  . meloxicam (MOBIC) 15 MG tablet Take 15 mg by mouth daily.  1  . metFORMIN (GLUCOPHAGE-XR) 500 MG 24 hr tablet Take 500 mg 2 (two) times daily by mouth.   3  . Multiple Vitamin (MULTIVITAMIN WITH MINERALS) TABS tablet Take 1 tablet by mouth daily.    Glory Rosebush VERIO test strip 3 (three) times daily. for testing  5  . OXYCONTIN 10 MG 12 hr tablet Take 10 mg by mouth every 12 (twelve) hours.  0  . polyethylene glycol powder (GLYCOLAX/MIRALAX) powder Take 17 g daily as needed by mouth for mild constipation. Averages once a month  6  . rosuvastatin (CRESTOR) 10 MG tablet Take 10 mg by mouth at bedtime.  5  . Alveda Reasons  20 MG TABS tablet Take 20 mg by mouth daily.  0   No current facility-administered medications for this visit.     REVIEW OF SYSTEMS:   [X]  denotes positive finding, [ ]  denotes negative finding Cardiac  Comments:  Chest pain or chest pressure:    Shortness of breath upon exertion:    Short of breath when lying flat:    Irregular heart rhythm:        Vascular    Pain in calf, thigh, or hip brought on by ambulation:    Pain in feet at night that wakes you up from your sleep:     Blood clot in your veins:    Leg swelling:         Pulmonary    Oxygen at home:    Productive cough:     Wheezing:         Neurologic    Sudden weakness in arms or legs:     Sudden numbness in arms or legs:     Sudden onset of difficulty speaking or slurred speech:    Temporary loss of vision in one eye:     Problems with  dizziness:         Gastrointestinal    Blood in stool:     Vomited blood:         Genitourinary    Burning when urinating:     Blood in urine:        Psychiatric    Major depression:         Hematologic    Bleeding problems:    Problems with blood clotting too easily:        Skin    Rashes or ulcers:        Constitutional    Fever or chills:      PHYSICAL EXAM:   Vitals:   10/23/17 1454  BP: (!) 162/93  Pulse: 99  Resp: 20  Temp: 98.4 F (36.9 C)  TempSrc: Oral  SpO2: 96%  Weight: (!) 317 lb (143.8 kg)  Height: 6' (1.829 m)    GENERAL: The patient is a well-nourished female, in no acute distress. The vital signs are documented above. PULMONARY: Non-labored respirations MUSCULOSKELETAL: There are no major deformities or cyanosis. NEUROLOGIC: No focal weakness or paresthesias are detected. SKIN: There are no ulcers or rashes noted. PSYCHIATRIC: The patient has a normal affect.  STUDIES:    I have ordered and reviewed her vascular lab studies which show sequela of prior venous obstruction in both legs.   MEDICAL ISSUES:   We discussed removing her inferior vena cava filter.  This will be scheduled for tomorrow.  She may require hematology consult to determine the duration of anticoagulation.    [name], MD [facility] and Vein Specialists of [facility] [phone] Pager [phone]

## 2017-10-23 NOTE — H&P (View-Only) (Signed)
Vascular and Vein Specialist of [facility]  Patient name: Anna Rowe MRN: [id number] DOB: 04/03/1976 Sex: female   REASON FOR VISIT:    Follow up  HISOTRY OF PRESENT ILLNESS:    Anna Rowe is a 42 y.o. female returns today for follow-up of her IVC filter which was placed on 04/25/2017 prior to hip surgery.  She initially had a right popliteal DVT in March 2018.  She continues to take Xarelto.   PAST MEDICAL HISTORY:   Past Medical History:  Diagnosis Date  . Arthritis   . Diabetes mellitus ([place])    type 2   . DVT (deep venous thrombosis) ([place]) dx 10-2016   RLE   . HLD (hyperlipidemia)    on crestor  . Pneumonia    09/2016  . Sleep apnea    per patient ;been dx does not use CPAP does not tolerate     FAMILY HISTORY:   No family history on file.  SOCIAL HISTORY:   Social History   Tobacco Use  . Smoking status: Current Every Day Smoker    Packs/day: 0.50    Years: 17.00    Pack years: 8.50    Types: Cigarettes  . Smokeless tobacco: Never Used  . Tobacco comment: 3 Loose cigarettes in last week  Substance Use Topics  . Alcohol use: No    Comment: seldom      ALLERGIES:   Allergies  Allergen Reactions  . Methocarbamol Anaphylaxis and Swelling  . Tramadol Anaphylaxis    Tolerates Dilaudid, Oxycontin 04/04/16  . Bee Venom Hives     CURRENT MEDICATIONS:   Current Outpatient Medications  Medication Sig Dispense Refill  . amitriptyline (ELAVIL) 50 MG tablet Take 50 mg by mouth at bedtime.  2  . CHANTIX CONTINUING MONTH PAK 1 MG tablet Take 1 mg by mouth 2 (two) times daily.  4  . ciclopirox (PENLAC) 8 % solution Apply 1 application topically 2 (two) times daily.   6  . clotrimazole-betamethasone (LOTRISONE) cream Apply 1 application topically 2 (two) times daily.  3  . cyclobenzaprine (FLEXERIL) 10 MG tablet Take 10 mg by mouth 3 (three) times daily.  1  . docusate sodium (COLACE)  100 MG capsule Take 100 mg by mouth 2 (two) times daily.   6  . gabapentin (NEURONTIN) 600 MG tablet Take 600 mg by mouth 3 (three) times daily.  3  . insulin regular (NOVOLIN R,HUMULIN R) 100 units/mL injection Inject 5-10 Units into the skin daily as needed for high blood sugar (depends on blood sugar readings if takes  5-10 units).     . medroxyPROGESTERone (DEPO-PROVERA) 150 MG/ML injection Inject 150 mg every 3 (three) months into the muscle. Last dose 06/28/2018  3  . meloxicam (MOBIC) 15 MG tablet Take 15 mg by mouth daily.  1  . metFORMIN (GLUCOPHAGE-XR) 500 MG 24 hr tablet Take 500 mg 2 (two) times daily by mouth.   3  . Multiple Vitamin (MULTIVITAMIN WITH MINERALS) TABS tablet Take 1 tablet by mouth daily.    Glory Rosebush VERIO test strip 3 (three) times daily. for testing  5  . OXYCONTIN 10 MG 12 hr tablet Take 10 mg by mouth every 12 (twelve) hours.  0  . polyethylene glycol powder (GLYCOLAX/MIRALAX) powder Take 17 g daily as needed by mouth for mild constipation. Averages once a month  6  . rosuvastatin (CRESTOR) 10 MG tablet Take 10 mg by mouth at bedtime.  5  . Alveda Reasons  20 MG TABS tablet Take 20 mg by mouth daily.  0   No current facility-administered medications for this visit.     REVIEW OF SYSTEMS:   [X]  denotes positive finding, [ ]  denotes negative finding Cardiac  Comments:  Chest pain or chest pressure:    Shortness of breath upon exertion:    Short of breath when lying flat:    Irregular heart rhythm:        Vascular    Pain in calf, thigh, or hip brought on by ambulation:    Pain in feet at night that wakes you up from your sleep:     Blood clot in your veins:    Leg swelling:         Pulmonary    Oxygen at home:    Productive cough:     Wheezing:         Neurologic    Sudden weakness in arms or legs:     Sudden numbness in arms or legs:     Sudden onset of difficulty speaking or slurred speech:    Temporary loss of vision in one eye:     Problems with  dizziness:         Gastrointestinal    Blood in stool:     Vomited blood:         Genitourinary    Burning when urinating:     Blood in urine:        Psychiatric    Major depression:         Hematologic    Bleeding problems:    Problems with blood clotting too easily:        Skin    Rashes or ulcers:        Constitutional    Fever or chills:      PHYSICAL EXAM:   Vitals:   10/23/17 1454  BP: (!) 162/93  Pulse: 99  Resp: 20  Temp: 98.4 F (36.9 C)  TempSrc: Oral  SpO2: 96%  Weight: (!) 317 lb (143.8 kg)  Height: 6' (1.829 m)    GENERAL: The patient is a well-nourished female, in no acute distress. The vital signs are documented above. PULMONARY: Non-labored respirations MUSCULOSKELETAL: There are no major deformities or cyanosis. NEUROLOGIC: No focal weakness or paresthesias are detected. SKIN: There are no ulcers or rashes noted. PSYCHIATRIC: The patient has a normal affect.  STUDIES:    I have ordered and reviewed her vascular lab studies which show sequela of prior venous obstruction in both legs.   MEDICAL ISSUES:   We discussed removing her inferior vena cava filter.  This will be scheduled for tomorrow.  She may require hematology consult to determine the duration of anticoagulation.    [name], MD Vascular and Vein Specialists of [facility] [phone] Pager [phone]

## 2017-10-24 ENCOUNTER — Encounter (HOSPITAL_COMMUNITY)
Admission: RE | Disposition: A | Discharge status: Home / Self Care | Payer: Self-pay | Source: Ambulatory Visit | Attending: Surgery

## 2017-10-24 ENCOUNTER — Ambulatory Visit (INDEPENDENT_AMBULATORY_CARE_PROVIDER_SITE_OTHER): Payer: 59 | Admitting: Orthopaedic Surgery

## 2017-10-24 ENCOUNTER — Ambulatory Visit (HOSPITAL_COMMUNITY)
Admission: RE | Admit: 2017-10-24 | Discharge: 2017-10-24 | Disposition: A | Discharge status: Home / Self Care | Payer: 59 | Source: Ambulatory Visit | Attending: Surgery | Admitting: Surgery

## 2017-10-24 DIAGNOSIS — Z4689 Encounter for fitting and adjustment of other specified devices: Secondary | ICD-10-CM | POA: Insufficient documentation

## 2017-10-24 DIAGNOSIS — E119 Type 2 diabetes mellitus without complications: Secondary | ICD-10-CM | POA: Diagnosis not present

## 2017-10-24 DIAGNOSIS — Z7901 Long term (current) use of anticoagulants: Secondary | ICD-10-CM | POA: Diagnosis not present

## 2017-10-24 DIAGNOSIS — Z86718 Personal history of other venous thrombosis and embolism: Secondary | ICD-10-CM | POA: Diagnosis not present

## 2017-10-24 DIAGNOSIS — M1990 Unspecified osteoarthritis, unspecified site: Secondary | ICD-10-CM | POA: Diagnosis not present

## 2017-10-24 DIAGNOSIS — G4730 Sleep apnea, unspecified: Secondary | ICD-10-CM | POA: Insufficient documentation

## 2017-10-24 DIAGNOSIS — Z794 Long term (current) use of insulin: Secondary | ICD-10-CM | POA: Diagnosis not present

## 2017-10-24 DIAGNOSIS — E785 Hyperlipidemia, unspecified: Secondary | ICD-10-CM | POA: Insufficient documentation

## 2017-10-24 DIAGNOSIS — I82499 Acute embolism and thrombosis of other specified deep vein of unspecified lower extremity: Secondary | ICD-10-CM | POA: Diagnosis not present

## 2017-10-24 DIAGNOSIS — F17210 Nicotine dependence, cigarettes, uncomplicated: Secondary | ICD-10-CM | POA: Diagnosis not present

## 2017-10-24 HISTORY — PX: IVC FILTER REMOVAL: CATH118246

## 2017-10-24 LAB — PREGNANCY, URINE: Preg Test, Ur: NEGATIVE

## 2017-10-24 LAB — POCT I-STAT, CHEM 8
BUN: 7 mg/dL (ref 6–20)
Calcium, Ion: 1.2 mmol/L (ref 1.15–1.40)
Chloride: 102 mmol/L (ref 101–111)
Creatinine, Ser: 0.7 mg/dL (ref 0.44–1.00)
Glucose, Bld: 175 mg/dL — ABNORMAL HIGH (ref 65–99)
HCT: 44 % (ref 36.0–46.0)
Hemoglobin: 15 g/dL (ref 12.0–15.0)
Potassium: 3.4 mmol/L — ABNORMAL LOW (ref 3.5–5.1)
Sodium: 142 mmol/L (ref 135–145)
TCO2: 24 mmol/L (ref 22–32)

## 2017-10-24 SURGERY — IVC FILTER REMOVAL
Anesthesia: LOCAL

## 2017-10-24 MED ORDER — SODIUM CHLORIDE 0.9% FLUSH: 3.0000 mL | Freq: Two times a day (BID) | INTRAVENOUS | Status: DC

## 2017-10-24 MED ORDER — SODIUM CHLORIDE 0.9 % IV SOLN: 250.0000 mL | INTRAVENOUS | Status: DC | PRN

## 2017-10-24 MED ORDER — HEPARIN (PORCINE) IN NACL 2-0.9 UNIT/ML-% IJ SOLN
INTRAMUSCULAR | Status: AC | PRN
  Administered 2017-10-24: 500 mL

## 2017-10-24 MED ORDER — MIDAZOLAM HCL 2 MG/2ML IJ SOLN
INTRAMUSCULAR | Status: AC
  Filled 2017-10-24: qty 2

## 2017-10-24 MED ORDER — HEPARIN (PORCINE) IN NACL 2-0.9 UNIT/ML-% IJ SOLN
INTRAMUSCULAR | Status: AC
  Filled 2017-10-24: qty 500

## 2017-10-24 MED ORDER — LABETALOL HCL 5 MG/ML IV SOLN: 10.0000 mg | INTRAVENOUS | Status: DC | PRN

## 2017-10-24 MED ORDER — IODIXANOL 320 MG/ML IV SOLN
INTRAVENOUS | Status: DC | PRN
  Administered 2017-10-24: 80 mL via INTRAVENOUS

## 2017-10-24 MED ORDER — LIDOCAINE HCL 1 % IJ SOLN
INTRAMUSCULAR | Status: AC
  Filled 2017-10-24: qty 20

## 2017-10-24 MED ORDER — SODIUM CHLORIDE 0.9 % WEIGHT BASED INFUSION: 1.0000 mL/kg/h | INTRAVENOUS | Status: DC

## 2017-10-24 MED ORDER — MIDAZOLAM HCL 2 MG/2ML IJ SOLN
INTRAMUSCULAR | Status: DC | PRN
  Administered 2017-10-24 (×3): 2 mg via INTRAVENOUS

## 2017-10-24 MED ORDER — FENTANYL CITRATE (PF) 100 MCG/2ML IJ SOLN
INTRAMUSCULAR | Status: DC | PRN
  Administered 2017-10-24 (×3): 50 ug via INTRAVENOUS

## 2017-10-24 MED ORDER — FENTANYL CITRATE (PF) 100 MCG/2ML IJ SOLN
INTRAMUSCULAR | Status: AC
  Filled 2017-10-24: qty 2

## 2017-10-24 MED ORDER — HYDRALAZINE HCL 20 MG/ML IJ SOLN: 5.0000 mg | INTRAMUSCULAR | Status: DC | PRN

## 2017-10-24 MED ORDER — LIDOCAINE HCL (PF) 1 % IJ SOLN
INTRAMUSCULAR | Status: DC | PRN
  Administered 2017-10-24: 20 mL via SUBCUTANEOUS

## 2017-10-24 MED ORDER — SODIUM CHLORIDE 0.9 % IV SOLN
INTRAVENOUS | Status: DC
  Administered 2017-10-24: 06:00:00 via INTRAVENOUS

## 2017-10-24 MED ORDER — SODIUM CHLORIDE 0.9% FLUSH: 3.0000 mL | INTRAVENOUS | Status: DC | PRN

## 2017-10-24 SURGICAL SUPPLY — 16 items
BAG SNAP BAND KOVER 36X36 (MISCELLANEOUS) ×4 IMPLANT
CATH OMNI FLUSH 5F 65CM (CATHETERS) ×2 IMPLANT
COVER DOME SNAP 22 D (MISCELLANEOUS) ×2 IMPLANT
COVER PRB 48X5XTLSCP FOLD TPE (BAG) ×1 IMPLANT
COVER PROBE 5X48 (BAG) ×1
DEVICE ONE SNARE 10MM (MISCELLANEOUS) ×2 IMPLANT
KIT MICROPUNCTURE NIT STIFF (SHEATH) ×2 IMPLANT
PROTECTION STATION PRESSURIZED (MISCELLANEOUS) ×2
SHEATH PINNACLE ST 7F 65CM (SHEATH) ×2 IMPLANT
SHIELD RADPAD SCOOP 12X17 (MISCELLANEOUS) ×2 IMPLANT
STATION PROTECTION PRESSURIZED (MISCELLANEOUS) ×1 IMPLANT
STOPCOCK MORSE 400PSI 3WAY (MISCELLANEOUS) ×2 IMPLANT
SYR MEDRAD MARK V 150ML (SYRINGE) ×2 IMPLANT
TRAY PV CATH (CUSTOM PROCEDURE TRAY) ×2 IMPLANT
TUBING HIGH PRESSURE 120CM (CONNECTOR) ×4 IMPLANT
WIRE BENTSON .035X145CM (WIRE) ×2 IMPLANT

## 2017-10-24 NOTE — Op Note (Signed)
    Patient name: Anna Rowe MRN: [id number] DOB: [birth year] Sex: female  10/24/2017 Pre-operative Diagnosis: DVT Post-operative diagnosis:  Same Surgeon:  [name] Procedure Performed:  1.  Ultrasound-guided access, right internal jugular vein  2.  Inferior venacavogram  3.  Foreign body removal (inferior vena cava filter)  4.  Conscious sedation (greater than 15 minutes)     Indications: The patient has a history of DVT.  A prophylactic vena cava filter was placed for bilateral hip surgery.  She had a duplex yesterday that showed no evidence of acute DVT.  She comes in today for filter removal  Procedure:  The patient was identified in the holding area and taken to room 8.  The patient was then placed supine on the table and prepped and draped in the usual sterile fashion.  A time out was called.  Conscious sedation was administered with the use of IV fentanyl and Versed under continuous physician and nurse monitoring.  Heart rate, blood pressure, and oxygen saturations were continuously monitored.  Ultrasound was used to evaluate the right internal jugular vein which was widely patent and easily compressible.  1% lidocaine was used for local anesthesia.  The right internal jugular vein was then cannulated under ultrasound guidance with a micropuncture needle.  An 018 wire was advanced without resistance followed by micropuncture sheath.  The introducer and wire were removed and a 035 Bentson wire was navigated into the inferior vena cava.  The micropuncture sheath was removed and a Omni Flush catheter was then inserted distal to the vena cava filter.  An inferior vena cava-was then obtained gram Findings:   Vena cava: Vena cava is widely patent.  No thrombus is identified within the filter.  Intervention: A 035 Bentson wire was then inserted through the Omni Flush catheter which was then removed.  A 7 French Terumo sheath was then advanced down to the level of the  filter.  A 4 French 10 mm gooseneck snare was then used to grasp the hook of the filter.  The sheath was then advanced down over top of the filter, collapsing it.  The legs were somewhat embedded into the vena cava but were able to be captured within the sheath.  The filter was then brought out through the sheath.  The hemostatic valve was removed in order to remove the filter.  The bowel was then replaced.  The filter was inspected.  It was completely intact.  Next a Bentson wire was inserted back to the sheath followed by the dilator.  The sheath was then advanced distal to the filter and a completion venacavogram was performed which showed no evidence of extravasation and a widely patent vena cava.  The sheath was then removed and manual pressure was held for hemostasis.  There were no immediate complications.  Impression:  #1  Successful removal of inferior vena cava filter    V. [name], M.D. [facility] Specialists of [place] Office: [phone] Pager:  [phone]

## 2017-10-24 NOTE — Interval H&P Note (Signed)
History and Physical Interval Note:  10/24/2017 7:59 AM  Anna Rowe  has presented today for surgery, with the diagnosis of completion of care  The various methods of treatment have been discussed with the patient and family. After consideration of risks, benefits and other options for treatment, the patient has consented to  Procedure(s): IVC FILTER REMOVAL (N/A) as a surgical intervention .  The patient's history has been reviewed, patient examined, no change in status, stable for surgery.  I have reviewed the patient's chart and labs.  Questions were answered to the patient's satisfaction.     [name]

## 2017-10-24 NOTE — Discharge Instructions (Signed)
Inferior Vena Cava Filter Insertion, Care After This sheet gives you information about how to care for yourself after your procedure. Your health care provider may also give you more specific instructions. If you have problems or questions, contact your health care provider. What can I expect after the procedure? After your procedure, it is common to have:  Mild pain in the area where the filter was inserted.  Mild bruising in the area where the filter was inserted.  Follow these instructions at home: Insertion site care  Follow instructions from your health care provider about how to take care of the site where a catheter was inserted at your neck or groin (insertion site). Make sure you: ? Wash your hands with soap and water before you change your bandage (dressing). If soap and water are not available, use hand sanitizer. ? Change your dressing as told by your health care provider.  Check your insertion site every day for signs of infection. Check for: ? More redness, swelling, or pain. ? More fluid or blood. ? Warmth. ? Pus or a bad smell.  Keep the insertion site clean and dry.  Do not shower, bathe, use a hot tub, or let the dressing get wet until your health care provider approves. General instructions  Take over-the-counter and prescription medicines only as told by your health care provider.  Avoid heavy lifting or hard activities for 48 hours after the procedure or as told by your health care provider.  Do not drive for 24 hours if you were given a a medicine to help you relax (sedative).  Do not drive or use heavy machinery while taking prescription pain medicine.  Do not go back to school or work until your health care provider approves.  Keep all follow-up visits as told by your health care provider. This is important. Contact a health care provider if:  You have more redness, swelling, or pain around your insertion site.  You have more fluid or blood coming  from your insertion site.  Your insertion site feels warm to the touch.  You have pus or a bad smell coming from your insertion site.  You have a fever.  You are dizzy.  You have nausea and vomiting.  You develop a rash. Get help right away if:  You develop chest pain, a cough, or difficulty breathing.  You develop shortness of breath, feel faint, or pass out.  You cough up blood.  You have severe pain in your abdomen.  You develop swelling and discoloration or pain in your legs.  Your legs become pale and cold or blue.  You develop weakness, difficulty moving your arms or legs, or balance problems.  You develop problems with speech or vision. These symptoms may represent a serious problem that is an emergency. Do not wait to see if the symptoms will go away. Get medical help right away. Call your local emergency services (911 in the [place] Do not drive yourself to the hospital. Summary  After your insertion procedure, it is common to have mild pain and bruising.  Do not shower, bathe, use a hot tub, or let the dressing get wet until your health care provider approves.  Every day, check for signs of infection where a catheter was inserted at your neck or groin (insertion site). This information is not intended to replace advice given to you by your health care provider. Make sure you discuss any questions you have with your health care provider. Document Released: 05/22/2013 Document  Revised: 06/22/2016 Document Reviewed: 06/22/2016 Elsevier Interactive Patient Education  2017 [employer].

## 2017-10-25 ENCOUNTER — Encounter (HOSPITAL_COMMUNITY): Payer: Self-pay | Admitting: Surgery

## 2017-10-25 MED FILL — Lidocaine HCl Local Inj 1%: INTRAMUSCULAR | Qty: 20 | Status: AC

## 2018-07-22 ENCOUNTER — Encounter (HOSPITAL_BASED_OUTPATIENT_CLINIC_OR_DEPARTMENT_OTHER): Payer: Self-pay | Admitting: Emergency Medicine

## 2018-07-22 ENCOUNTER — Emergency Department (HOSPITAL_BASED_OUTPATIENT_CLINIC_OR_DEPARTMENT_OTHER): Payer: BC Managed Care – PPO

## 2018-07-22 ENCOUNTER — Emergency Department (HOSPITAL_BASED_OUTPATIENT_CLINIC_OR_DEPARTMENT_OTHER)
Admission: EM | Admit: 2018-07-22 | Discharge: 2018-07-22 | Discharge status: Against Medical Advice | Payer: BC Managed Care – PPO | Attending: Emergency Medicine | Admitting: Emergency Medicine

## 2018-07-22 ENCOUNTER — Other Ambulatory Visit: Payer: Self-pay

## 2018-07-22 DIAGNOSIS — I509 Heart failure, unspecified: Secondary | ICD-10-CM | POA: Diagnosis not present

## 2018-07-22 DIAGNOSIS — R600 Localized edema: Secondary | ICD-10-CM | POA: Diagnosis present

## 2018-07-22 DIAGNOSIS — R778 Other specified abnormalities of plasma proteins: Secondary | ICD-10-CM

## 2018-07-22 DIAGNOSIS — Z7984 Long term (current) use of oral hypoglycemic drugs: Secondary | ICD-10-CM | POA: Diagnosis not present

## 2018-07-22 DIAGNOSIS — Z96643 Presence of artificial hip joint, bilateral: Secondary | ICD-10-CM | POA: Insufficient documentation

## 2018-07-22 DIAGNOSIS — Z96651 Presence of right artificial knee joint: Secondary | ICD-10-CM | POA: Insufficient documentation

## 2018-07-22 DIAGNOSIS — Z7901 Long term (current) use of anticoagulants: Secondary | ICD-10-CM | POA: Diagnosis not present

## 2018-07-22 DIAGNOSIS — Z79899 Other long term (current) drug therapy: Secondary | ICD-10-CM | POA: Insufficient documentation

## 2018-07-22 DIAGNOSIS — R7989 Other specified abnormal findings of blood chemistry: Secondary | ICD-10-CM | POA: Insufficient documentation

## 2018-07-22 DIAGNOSIS — R609 Edema, unspecified: Secondary | ICD-10-CM

## 2018-07-22 DIAGNOSIS — E119 Type 2 diabetes mellitus without complications: Secondary | ICD-10-CM | POA: Diagnosis not present

## 2018-07-22 DIAGNOSIS — F17210 Nicotine dependence, cigarettes, uncomplicated: Secondary | ICD-10-CM | POA: Insufficient documentation

## 2018-07-22 LAB — CBC WITH DIFFERENTIAL/PLATELET
Abs Immature Granulocytes: 0.02 10*3/uL (ref 0.00–0.07)
Basophils Absolute: 0.1 10*3/uL (ref 0.0–0.1)
Basophils Relative: 1 %
Eosinophils Absolute: 0.1 10*3/uL (ref 0.0–0.5)
Eosinophils Relative: 1 %
HCT: 46 % (ref 36.0–46.0)
Hemoglobin: 13.8 g/dL (ref 12.0–15.0)
Immature Granulocytes: 0 %
Lymphocytes Relative: 43 %
Lymphs Abs: 3.4 10*3/uL (ref 0.7–4.0)
MCH: 21.9 pg — ABNORMAL LOW (ref 26.0–34.0)
MCHC: 30 g/dL (ref 30.0–36.0)
MCV: 72.9 fL — ABNORMAL LOW (ref 80.0–100.0)
Monocytes Absolute: 0.5 10*3/uL (ref 0.1–1.0)
Monocytes Relative: 6 %
Neutro Abs: 3.9 10*3/uL (ref 1.7–7.7)
Neutrophils Relative %: 49 %
Platelets: 423 10*3/uL — ABNORMAL HIGH (ref 150–400)
RBC: 6.31 MIL/uL — ABNORMAL HIGH (ref 3.87–5.11)
RDW: 18.4 % — ABNORMAL HIGH (ref 11.5–15.5)
WBC: 7.9 10*3/uL (ref 4.0–10.5)
nRBC: 0 % (ref 0.0–0.2)

## 2018-07-22 LAB — BASIC METABOLIC PANEL
Anion gap: 8 (ref 5–15)
BUN: 10 mg/dL (ref 6–20)
CO2: 27 mmol/L (ref 22–32)
Calcium: 8.8 mg/dL — ABNORMAL LOW (ref 8.9–10.3)
Chloride: 104 mmol/L (ref 98–111)
Creatinine, Ser: 0.94 mg/dL (ref 0.44–1.00)
GFR calc Af Amer: >60 mL/min (ref >60)
GFR calc non Af Amer: >60 mL/min (ref >60)
Glucose, Bld: 123 mg/dL — ABNORMAL HIGH (ref 70–99)
Potassium: 3.4 mmol/L — ABNORMAL LOW (ref 3.5–5.1)
Sodium: 139 mmol/L (ref 135–145)

## 2018-07-22 LAB — URINALYSIS, MICROSCOPIC (REFLEX)

## 2018-07-22 LAB — URINALYSIS, ROUTINE W REFLEX MICROSCOPIC
Bilirubin Urine: NEGATIVE
Glucose, UA: NEGATIVE mg/dL
Ketones, ur: NEGATIVE mg/dL
Nitrite: NEGATIVE
Protein, ur: NEGATIVE mg/dL
Specific Gravity, Urine: 1.015 (ref 1.005–1.030)
pH: 6 (ref 5.0–8.0)

## 2018-07-22 LAB — TROPONIN I: Troponin I: 0.09 ng/mL (ref <0.03)

## 2018-07-22 LAB — BRAIN NATRIURETIC PEPTIDE: B Natriuretic Peptide: 1149.2 pg/mL — ABNORMAL HIGH (ref 0.0–100.0)

## 2018-07-22 MED ORDER — FUROSEMIDE 40 MG PO TABS: 40.0000 mg | ORAL_TABLET | Freq: Two times a day (BID) | ORAL | 0 refills | Status: DC

## 2018-07-22 MED ORDER — POTASSIUM CHLORIDE CRYS ER 20 MEQ PO TBCR
60.0000 meq | EXTENDED_RELEASE_TABLET | Freq: Once | ORAL | Status: AC
  Administered 2018-07-22: 60 meq via ORAL
  Filled 2018-07-22: qty 3

## 2018-07-22 MED ORDER — POTASSIUM CHLORIDE CRYS ER 20 MEQ PO TBCR: 20.0000 meq | EXTENDED_RELEASE_TABLET | Freq: Two times a day (BID) | ORAL | 0 refills | Status: DC

## 2018-07-22 MED ORDER — FUROSEMIDE 10 MG/ML IJ SOLN
80.0000 mg | Freq: Once | INTRAMUSCULAR | Status: AC
  Administered 2018-07-22: 80 mg via INTRAVENOUS
  Filled 2018-07-22: qty 8

## 2018-07-22 NOTE — ED Notes (Signed)
PAtient continued to refuse EKG.  It has been explained to her that it is necessary to have it done due to her Troponin but continued to refused an EKG.  Patient stated, "I don't want to be paying all this bill. All I want you to do is take this fluid off me and I will be alright."     MD made aware of the refusal 3 times.  Family at bedside while I tried to convinced the patient.

## 2018-07-22 NOTE — ED Provider Notes (Signed)
[place] EMERGENCY DEPARTMENT Provider Note   CSN: [id number] Arrival date & time: 07/22/18  1715     History   Chief Complaint Chief Complaint  Patient presents with  . Dysuria    urinary retention    HPI Anna Rowe is a 42 y.o. female.  HPI   13yF with increasing peripheral edema. Onset last week. She says it began last week shorlty after having CT scan with contrast. Says she drank a lot of water after to flush her kidneys. She has been urinating less since then. [facility] to [facility] Friday and prescribed lasix. Took one dose Fri, one dose yesterday and two this morning. She feels like swelling has been worsening despite this. No CP. No known heart problems that she is aware of. She has dyspnea which she says is chronic and the reason she had the recent CT. She denies acute change in this. She is on xarelto for DVT.   Past Medical History:  Diagnosis Date  . Arthritis   . Diabetes mellitus ([place])    type 2   . DVT (deep venous thrombosis) ([place]) dx 10-2016   RLE   . HLD (hyperlipidemia)    on crestor  . Pneumonia    09/2016  . Sleep apnea    per patient ;been dx does not use CPAP does not tolerate    Patient Active Problem List   Diagnosis Date Noted  . Unilateral primary osteoarthritis, left knee 08/24/2017  . History of total right knee replacement 08/24/2017  . Chronic bilateral low back pain 08/24/2017  . Status post total replacement of right hip 07/14/2017  . Status post total replacement of left hip 04/28/2017  . Unilateral primary osteoarthritis, left hip 03/09/2017  . Unilateral primary osteoarthritis, right hip 03/09/2017  . Primary osteoarthritis of right knee 04/04/2016    Past Surgical History:  Procedure Laterality Date  . ACHILLES TENDON REPAIR    . HERNIA REPAIR     x2; hiatal and umbilical   . IVC FILTER INSERTION N/A 04/25/2017   Procedure: IVC Filter Insertion;  Surgeon: Serafina Mitchell, MD;   Location: [place] [facility];  Service: Cardiovascular;  Laterality: N/A;  . IVC FILTER REMOVAL N/A 10/24/2017   Procedure: IVC FILTER REMOVAL;  Surgeon: Serafina Mitchell, MD;  Location: [place] [facility];  Service: Cardiovascular;  Laterality: N/A;  . JOINT REPLACEMENT     right hip 07/14/17 Dr. Ninfa Linden  . KNEE ARTHROSCOPY Right 2015  . plantar    . TOTAL HIP ARTHROPLASTY Left 04/28/2017   Procedure: LEFT TOTAL HIP ARTHROPLASTY ANTERIOR APPROACH;  Surgeon: Mcarthur Rossetti, MD;  Location: [facility];  Service: Orthopedics;  Laterality: Left;  . TOTAL HIP ARTHROPLASTY Right 07/14/2017   Procedure: RIGHT TOTAL HIP ARTHROPLASTY ANTERIOR APPROACH;  Surgeon: Mcarthur Rossetti, MD;  Location: [facility];  Service: Orthopedics;  Laterality: Right;  . TOTAL KNEE ARTHROPLASTY Right 04/04/2016   Procedure: TOTAL KNEE ARTHROPLASTY;  Surgeon: Dorna Leitz, MD;  Location: [place] [facility];  Service: Orthopedics;  Laterality: Right;     OB History   None      Home Medications    Prior to Admission medications   Medication Sig Start Date End Date Taking? Authorizing Provider  amitriptyline (ELAVIL) 50 MG tablet Take 50 mg by mouth at bedtime. 01/15/17   [provider]  CHANTIX CONTINUING MONTH PAK 1 MG tablet Take 1 mg by mouth 2 (two) times daily. 01/29/17   [provider]  ciclopirox (PENLAC) 8 % solution Apply 1 application topically 2 (two) times daily as needed.  03/24/17   [provider]  clotrimazole-betamethasone (LOTRISONE) cream Apply 1 application topically 2 (two) times daily as needed (irritation).  01/31/17   [provider]  cyclobenzaprine (FLEXERIL) 10 MG tablet Take 10 mg by mouth 3 (three) times daily. 01/14/17   [provider]  docusate sodium (COLACE) 100 MG capsule Take 100 mg by mouth 2 (two) times daily.  02/09/16   [provider]  gabapentin (NEURONTIN) 600 MG tablet Take 600 mg by mouth 3 (three) times daily. 01/17/17   [provider]  insulin regular (NOVOLIN R,HUMULIN R) 100 units/mL injection Inject 5-10 Units into the skin daily as needed for high blood sugar (depends on blood sugar readings if takes  5-10 units).  03/14/17 03/14/18  [provider]  medroxyPROGESTERone (DEPO-PROVERA) 150 MG/ML injection Inject 150 mg every 3 (three) months into the muscle. Last dose 06/28/2018 01/20/17   [provider]  meloxicam (MOBIC) 15 MG tablet Take 15 mg by mouth daily. 02/01/17   [provider]  metFORMIN (GLUCOPHAGE-XR) 500 MG 24 hr tablet Take 500 mg 2 (two) times daily by mouth.  01/20/17   [provider]  Multiple Vitamin (MULTIVITAMIN WITH MINERALS) TABS tablet Take 1 tablet by mouth daily.    [provider]  [facility] VERIO test strip 3 (three) times daily. for testing 03/01/17   [provider]  oxyCODONE (OXY IR/ROXICODONE) 5 MG immediate release tablet Take 5 mg by mouth every 4 (four) hours as needed for severe pain.    [provider]  oxycodone (OXY-IR) 5 MG capsule Take 10 mg by mouth every 4 (four) hours as needed.    [provider]  OXYCONTIN 10 MG 12 hr tablet Take 10 mg by mouth every 12 (twelve) hours. 02/03/17   [provider]  polyethylene glycol powder (GLYCOLAX/MIRALAX) powder Take 17 g daily as needed by mouth for mild constipation. Averages once a month 02/09/16   [provider]  rosuvastatin (CRESTOR) 10 MG tablet Take 10 mg by mouth at bedtime. 01/20/17   [provider]  XARELTO 20 MG TABS tablet Take 20 mg by mouth daily. 02/05/17   [provider]    Family History No family history on file.  Social History Social History   Tobacco Use  . Smoking status: Current Every Day Smoker    Packs/day: 0.50    Years: 17.00    Pack years: 8.50    Types: Cigarettes  . Smokeless tobacco: Never Used  . Tobacco comment: 3 Loose cigarettes in last week  Substance Use Topics  . Alcohol use: No      Comment: seldom   . Drug use: No     Allergies   Methocarbamol; Tramadol; and Bee venom   Review of Systems Review of Systems  All systems reviewed and negative, other than as noted in HPI.  Physical Exam Updated Vital Signs BP (!) 155/100 (BP Location: Right Arm)   Pulse (!) 101   Temp 98.4 F (36.9 C) (Oral)   Resp 18   Ht 6' (1.829 m)   Wt (!) 149.7 kg   SpO2 95%   BMI 44.76 kg/m   Physical Exam Vitals signs and nursing note reviewed.  Constitutional:      General: She is not in acute distress.    Appearance: She is well-developed.  HENT:     Head:  Normocephalic and atraumatic.  Eyes:     General:        Right eye: No discharge.        Left eye: No discharge.     Conjunctiva/sclera: Conjunctivae normal.  Neck:     Musculoskeletal: Neck supple.  Cardiovascular:     Rate and Rhythm: Regular rhythm.     Heart sounds: Normal heart sounds. No murmur. No friction rub. No gallop.      Comments: Mild tachycardia Pulmonary:     Breath sounds: Normal breath sounds.     Comments: Tachypnea.  Abdominal:     General: There is no distension.     Palpations: Abdomen is soft.     Tenderness: There is no abdominal tenderness.  Musculoskeletal:        General: No tenderness.     Right lower leg: Edema present.     Left lower leg: Edema present.  Skin:    General: Skin is warm and dry.  Neurological:     Mental Status: She is alert.  Psychiatric:        Behavior: Behavior normal.        Thought Content: Thought content normal.      ED Treatments / Results  Labs (all labs ordered are listed, but only abnormal results are displayed) Labs Reviewed  CBC WITH DIFFERENTIAL/PLATELET - Abnormal; Notable for the following components:      Result Value   RBC 6.31 (*)    MCV 72.9 (*)    MCH 21.9 (*)    RDW 18.4 (*)    Platelets 423 (*)    All other components within normal limits  BASIC METABOLIC PANEL - Abnormal; Notable for the following components:    Potassium 3.4 (*)    Glucose, Bld 123 (*)    Calcium 8.8 (*)    All other components within normal limits  BRAIN NATRIURETIC PEPTIDE - Abnormal; Notable for the following components:   B Natriuretic Peptide 1,149.2 (*)    All other components within normal limits  TROPONIN I - Abnormal; Notable for the following components:   Troponin I 0.09 (*)    All other components within normal limits  URINALYSIS, ROUTINE W REFLEX MICROSCOPIC    EKG None  Radiology No results found.  Procedures Procedures (including critical care time)  Medications Ordered in ED Medications  furosemide (LASIX) injection 80 mg (has no administration in time range)  potassium chloride SA (K-DUR,KLOR-CON) CR tablet 60 mEq (has no administration in time range)     Initial Impression / Assessment and Plan / ED Course  I have reviewed the triage vital signs and the nursing notes.  Pertinent labs & imaging results that were available during my care of the patient were reviewed by me and considered in my medical decision making (see chart for details).     Pt refusing EKG. Refusing CXR. I explained she is in HF. Explained elevated troponin. She insists this is all related to her recent CT with contrast. I explained that her renal function is normal. I explained recommendations for admission and further testing/management. She says her breathing feels fine although she is obviously tachypneic. She has medical decision making capability and will be leaving AMA. She was given lasix in the ED with response. Will increase lasix to 1m BID. Potassium. Explained the need to follow-up with her PCP ASAP and return precautions sooner.   Final Clinical Impressions(s) / ED Diagnoses   Final diagnoses:  Peripheral edema  Congestive  heart failure, unspecified HF chronicity, unspecified heart failure type [facility])  Elevated troponin    ED Discharge Orders    None       Virgel Manifold, MD 07/27/18 [phone]

## 2018-07-22 NOTE — ED Notes (Signed)
MD went to patient's room to explained to her about the necessity of having an EKG but patient continued to refused.

## 2018-07-22 NOTE — ED Notes (Signed)
Patient refused EKG stating that she just had it done by her PCP.  Had CT scan with contrast this week.  She drunk plenty of fluids after contrast to flush the contrast out.  She stated that after that her bilateral lower legs becomes swollen and tight.  Her BLE feels burning and tight.

## 2018-07-22 NOTE — ED Notes (Signed)
Patient refused ECG

## 2018-07-22 NOTE — ED Notes (Signed)
Patient informed that she is leaving AMA.  She wanted to know what AMA is.  I explained to her that since she refused almost every order like EKG, xray, then MD can not do nothing at this point.  She is okay with it.  MD did give her a script for Furosemide and Potassium.

## 2018-07-22 NOTE — ED Notes (Signed)
ED Provider at bedside. 

## 2018-07-22 NOTE — ED Triage Notes (Signed)
Pt states she was placed on Lasix last Friday and she is not able to urinate, pt wants to have her kidneys check.

## 2018-07-22 NOTE — ED Notes (Signed)
Troponin 0.09, results given to ED MD and primary nurse

## 2018-07-22 NOTE — Discharge Instructions (Signed)
Follow-up with your PCP as soon as you can.

## 2018-08-08 ENCOUNTER — Observation Stay (HOSPITAL_BASED_OUTPATIENT_CLINIC_OR_DEPARTMENT_OTHER): Payer: BC Managed Care – PPO

## 2018-08-08 ENCOUNTER — Emergency Department (HOSPITAL_COMMUNITY): Payer: BC Managed Care – PPO

## 2018-08-08 ENCOUNTER — Encounter (HOSPITAL_COMMUNITY): Payer: Self-pay | Admitting: Emergency Medicine

## 2018-08-08 ENCOUNTER — Other Ambulatory Visit: Payer: Self-pay

## 2018-08-08 ENCOUNTER — Inpatient Hospital Stay (HOSPITAL_COMMUNITY)
Admission: EM | Admit: 2018-08-08 | Discharge: 2018-08-15 | DRG: 286 | Disposition: A | Discharge status: Home / Self Care | Payer: BC Managed Care – PPO | Attending: Internal Medicine | Admitting: Internal Medicine

## 2018-08-08 DIAGNOSIS — E785 Hyperlipidemia, unspecified: Secondary | ICD-10-CM | POA: Diagnosis present

## 2018-08-08 DIAGNOSIS — M545 Low back pain, unspecified: Secondary | ICD-10-CM | POA: Diagnosis present

## 2018-08-08 DIAGNOSIS — I110 Hypertensive heart disease with heart failure: Principal | ICD-10-CM | POA: Diagnosis present

## 2018-08-08 DIAGNOSIS — Z96643 Presence of artificial hip joint, bilateral: Secondary | ICD-10-CM | POA: Diagnosis present

## 2018-08-08 DIAGNOSIS — E119 Type 2 diabetes mellitus without complications: Secondary | ICD-10-CM

## 2018-08-08 DIAGNOSIS — Z79899 Other long term (current) drug therapy: Secondary | ICD-10-CM

## 2018-08-08 DIAGNOSIS — Z9119 Patient's noncompliance with other medical treatment and regimen: Secondary | ICD-10-CM

## 2018-08-08 DIAGNOSIS — I509 Heart failure, unspecified: Secondary | ICD-10-CM

## 2018-08-08 DIAGNOSIS — I5043 Acute on chronic combined systolic (congestive) and diastolic (congestive) heart failure: Secondary | ICD-10-CM | POA: Diagnosis present

## 2018-08-08 DIAGNOSIS — Z7901 Long term (current) use of anticoagulants: Secondary | ICD-10-CM

## 2018-08-08 DIAGNOSIS — M7989 Other specified soft tissue disorders: Secondary | ICD-10-CM

## 2018-08-08 DIAGNOSIS — I5031 Acute diastolic (congestive) heart failure: Secondary | ICD-10-CM | POA: Diagnosis not present

## 2018-08-08 DIAGNOSIS — I2510 Atherosclerotic heart disease of native coronary artery without angina pectoris: Secondary | ICD-10-CM | POA: Diagnosis present

## 2018-08-08 DIAGNOSIS — Z79891 Long term (current) use of opiate analgesic: Secondary | ICD-10-CM

## 2018-08-08 DIAGNOSIS — G4733 Obstructive sleep apnea (adult) (pediatric): Secondary | ICD-10-CM | POA: Diagnosis present

## 2018-08-08 DIAGNOSIS — Z86718 Personal history of other venous thrombosis and embolism: Secondary | ICD-10-CM | POA: Diagnosis not present

## 2018-08-08 DIAGNOSIS — I340 Nonrheumatic mitral (valve) insufficiency: Secondary | ICD-10-CM

## 2018-08-08 DIAGNOSIS — Z8249 Family history of ischemic heart disease and other diseases of the circulatory system: Secondary | ICD-10-CM

## 2018-08-08 DIAGNOSIS — Z888 Allergy status to other drugs, medicaments and biological substances status: Secondary | ICD-10-CM

## 2018-08-08 DIAGNOSIS — Z791 Long term (current) use of non-steroidal anti-inflammatories (NSAID): Secondary | ICD-10-CM

## 2018-08-08 DIAGNOSIS — G894 Chronic pain syndrome: Secondary | ICD-10-CM | POA: Diagnosis present

## 2018-08-08 DIAGNOSIS — Z794 Long term (current) use of insulin: Secondary | ICD-10-CM

## 2018-08-08 DIAGNOSIS — K5909 Other constipation: Secondary | ICD-10-CM | POA: Diagnosis present

## 2018-08-08 DIAGNOSIS — R609 Edema, unspecified: Secondary | ICD-10-CM | POA: Diagnosis not present

## 2018-08-08 DIAGNOSIS — Z9989 Dependence on other enabling machines and devices: Secondary | ICD-10-CM

## 2018-08-08 DIAGNOSIS — Z885 Allergy status to narcotic agent status: Secondary | ICD-10-CM

## 2018-08-08 DIAGNOSIS — Z96651 Presence of right artificial knee joint: Secondary | ICD-10-CM | POA: Diagnosis present

## 2018-08-08 DIAGNOSIS — I428 Other cardiomyopathies: Secondary | ICD-10-CM | POA: Diagnosis present

## 2018-08-08 DIAGNOSIS — J449 Chronic obstructive pulmonary disease, unspecified: Secondary | ICD-10-CM | POA: Diagnosis present

## 2018-08-08 DIAGNOSIS — F17210 Nicotine dependence, cigarettes, uncomplicated: Secondary | ICD-10-CM | POA: Diagnosis present

## 2018-08-08 DIAGNOSIS — Z6841 Body Mass Index (BMI) 40.0 and over, adult: Secondary | ICD-10-CM

## 2018-08-08 DIAGNOSIS — N179 Acute kidney failure, unspecified: Secondary | ICD-10-CM | POA: Diagnosis present

## 2018-08-08 DIAGNOSIS — R0989 Other specified symptoms and signs involving the circulatory and respiratory systems: Secondary | ICD-10-CM | POA: Diagnosis present

## 2018-08-08 DIAGNOSIS — I5021 Acute systolic (congestive) heart failure: Secondary | ICD-10-CM

## 2018-08-08 DIAGNOSIS — G8929 Other chronic pain: Secondary | ICD-10-CM

## 2018-08-08 DIAGNOSIS — Z91030 Bee allergy status: Secondary | ICD-10-CM

## 2018-08-08 DIAGNOSIS — I472 Ventricular tachycardia: Secondary | ICD-10-CM | POA: Diagnosis present

## 2018-08-08 DIAGNOSIS — E1165 Type 2 diabetes mellitus with hyperglycemia: Secondary | ICD-10-CM | POA: Diagnosis present

## 2018-08-08 DIAGNOSIS — I493 Ventricular premature depolarization: Secondary | ICD-10-CM | POA: Diagnosis present

## 2018-08-08 DIAGNOSIS — R570 Cardiogenic shock: Secondary | ICD-10-CM | POA: Diagnosis present

## 2018-08-08 HISTORY — DX: Heart failure, unspecified: I50.9

## 2018-08-08 HISTORY — DX: Chronic obstructive pulmonary disease, unspecified: J44.9

## 2018-08-08 LAB — CBC WITH DIFFERENTIAL/PLATELET
Abs Immature Granulocytes: 0.03 10*3/uL (ref 0.00–0.07)
Basophils Absolute: 0.1 10*3/uL (ref 0.0–0.1)
Basophils Relative: 1 %
Eosinophils Absolute: 0.2 10*3/uL (ref 0.0–0.5)
Eosinophils Relative: 2 %
HCT: 45.9 % (ref 36.0–46.0)
Hemoglobin: 13.9 g/dL (ref 12.0–15.0)
Immature Granulocytes: 0 %
Lymphocytes Relative: 47 %
Lymphs Abs: 4.2 10*3/uL — ABNORMAL HIGH (ref 0.7–4.0)
MCH: 21.4 pg — ABNORMAL LOW (ref 26.0–34.0)
MCHC: 30.3 g/dL (ref 30.0–36.0)
MCV: 70.5 fL — ABNORMAL LOW (ref 80.0–100.0)
Monocytes Absolute: 0.5 10*3/uL (ref 0.1–1.0)
Monocytes Relative: 6 %
Neutro Abs: 3.9 10*3/uL (ref 1.7–7.7)
Neutrophils Relative %: 44 %
Platelets: 397 10*3/uL (ref 150–400)
RBC: 6.51 MIL/uL — ABNORMAL HIGH (ref 3.87–5.11)
RDW: 17.9 % — ABNORMAL HIGH (ref 11.5–15.5)
WBC: 8.9 10*3/uL (ref 4.0–10.5)
nRBC: 0 % (ref 0.0–0.2)

## 2018-08-08 LAB — BASIC METABOLIC PANEL
Anion gap: 11 (ref 5–15)
BUN: 17 mg/dL (ref 6–20)
CO2: 26 mmol/L (ref 22–32)
Calcium: 9.5 mg/dL (ref 8.9–10.3)
Chloride: 101 mmol/L (ref 98–111)
Creatinine, Ser: 1.23 mg/dL — ABNORMAL HIGH (ref 0.44–1.00)
GFR calc Af Amer: >60 mL/min (ref >60)
GFR calc non Af Amer: 54 mL/min — ABNORMAL LOW (ref >60)
Glucose, Bld: 117 mg/dL — ABNORMAL HIGH (ref 70–99)
Potassium: 4.2 mmol/L (ref 3.5–5.1)
Sodium: 138 mmol/L (ref 135–145)

## 2018-08-08 LAB — GLUCOSE, CAPILLARY
Glucose-Capillary: 108 mg/dL — ABNORMAL HIGH (ref 70–99)
Glucose-Capillary: 107 mg/dL — ABNORMAL HIGH (ref 70–99)
Glucose-Capillary: 165 mg/dL — ABNORMAL HIGH (ref 70–99)
Glucose-Capillary: 103 mg/dL — ABNORMAL HIGH (ref 70–99)

## 2018-08-08 LAB — CREATININE, SERUM
Creatinine, Ser: 1.01 mg/dL — ABNORMAL HIGH (ref 0.44–1.00)
GFR calc Af Amer: >60 mL/min (ref >60)
GFR calc non Af Amer: >60 mL/min (ref >60)

## 2018-08-08 LAB — ECHOCARDIOGRAM COMPLETE
Height: 72 in
Weight: 5072 oz

## 2018-08-08 LAB — BRAIN NATRIURETIC PEPTIDE: B Natriuretic Peptide: 810.5 pg/mL — ABNORMAL HIGH (ref 0.0–100.0)

## 2018-08-08 MED ORDER — POLYETHYLENE GLYCOL 3350 17 G PO PACK
17.0000 g | PACK | Freq: Every day | ORAL | Status: DC | PRN
  Administered 2018-08-08 – 2018-08-14 (×6): 17 g via ORAL
  Filled 2018-08-08 (×6): qty 1

## 2018-08-08 MED ORDER — ADULT MULTIVITAMIN W/MINERALS CH
1.0000 | ORAL_TABLET | Freq: Every day | ORAL | Status: DC
  Administered 2018-08-08 – 2018-08-15 (×8): 1 via ORAL
  Filled 2018-08-08 (×8): qty 1

## 2018-08-08 MED ORDER — ONDANSETRON HCL 4 MG/2ML IJ SOLN: 4.0000 mg | Freq: Four times a day (QID) | INTRAMUSCULAR | Status: DC | PRN

## 2018-08-08 MED ORDER — FUROSEMIDE 10 MG/ML IJ SOLN
80.0000 mg | Freq: Once | INTRAMUSCULAR | Status: AC
  Administered 2018-08-08: 80 mg via INTRAVENOUS
  Filled 2018-08-08: qty 8

## 2018-08-08 MED ORDER — SODIUM CHLORIDE 0.9 % IV SOLN: 250.0000 mL | INTRAVENOUS | Status: DC | PRN

## 2018-08-08 MED ORDER — INSULIN ASPART 100 UNIT/ML ~~LOC~~ SOLN
0.0000 [IU] | Freq: Three times a day (TID) | SUBCUTANEOUS | Status: DC
  Administered 2018-08-08: 2 [IU] via SUBCUTANEOUS
  Administered 2018-08-09: 3 [IU] via SUBCUTANEOUS
  Administered 2018-08-09 – 2018-08-11 (×4): 2 [IU] via SUBCUTANEOUS
  Administered 2018-08-11: 3 [IU] via SUBCUTANEOUS
  Administered 2018-08-12 – 2018-08-13 (×2): 2 [IU] via SUBCUTANEOUS
  Administered 2018-08-13: 3 [IU] via SUBCUTANEOUS
  Administered 2018-08-13 – 2018-08-14 (×2): 2 [IU] via SUBCUTANEOUS
  Administered 2018-08-15: 3 [IU] via SUBCUTANEOUS
  Administered 2018-08-15: 2 [IU] via SUBCUTANEOUS

## 2018-08-08 MED ORDER — CYCLOBENZAPRINE HCL 10 MG PO TABS
10.0000 mg | ORAL_TABLET | Freq: Three times a day (TID) | ORAL | Status: DC
  Administered 2018-08-08 – 2018-08-15 (×22): 10 mg via ORAL
  Filled 2018-08-08 (×23): qty 1

## 2018-08-08 MED ORDER — FUROSEMIDE 10 MG/ML IJ SOLN
40.0000 mg | Freq: Two times a day (BID) | INTRAMUSCULAR | Status: DC
  Administered 2018-08-08 – 2018-08-09 (×3): 40 mg via INTRAVENOUS
  Filled 2018-08-08 (×3): qty 4

## 2018-08-08 MED ORDER — OXYCODONE HCL ER 10 MG PO T12A
10.0000 mg | EXTENDED_RELEASE_TABLET | Freq: Two times a day (BID) | ORAL | Status: DC
  Administered 2018-08-08 – 2018-08-15 (×15): 10 mg via ORAL
  Filled 2018-08-08 (×15): qty 1

## 2018-08-08 MED ORDER — ROSUVASTATIN CALCIUM 5 MG PO TABS
10.0000 mg | ORAL_TABLET | Freq: Every day | ORAL | Status: DC
  Administered 2018-08-08 – 2018-08-14 (×7): 10 mg via ORAL
  Filled 2018-08-08 (×7): qty 2

## 2018-08-08 MED ORDER — OXYCODONE-ACETAMINOPHEN 5-325 MG PO TABS: 1.0000 | ORAL_TABLET | Freq: Two times a day (BID) | ORAL | Status: DC | PRN

## 2018-08-08 MED ORDER — OXYCODONE-ACETAMINOPHEN 5-325 MG PO TABS
1.0000 | ORAL_TABLET | Freq: Four times a day (QID) | ORAL | Status: DC | PRN
  Administered 2018-08-08 – 2018-08-15 (×18): 1 via ORAL
  Filled 2018-08-08 (×18): qty 1

## 2018-08-08 MED ORDER — MELOXICAM 7.5 MG PO TABS
15.0000 mg | ORAL_TABLET | Freq: Every day | ORAL | Status: DC
  Administered 2018-08-08: 15 mg via ORAL
  Filled 2018-08-08: qty 2

## 2018-08-08 MED ORDER — NITROGLYCERIN 2 % TD OINT
1.0000 [in_us] | TOPICAL_OINTMENT | Freq: Once | TRANSDERMAL | Status: AC
  Administered 2018-08-08: 1 [in_us] via TOPICAL
  Filled 2018-08-08: qty 1

## 2018-08-08 MED ORDER — SODIUM CHLORIDE 0.9% FLUSH
3.0000 mL | Freq: Two times a day (BID) | INTRAVENOUS | Status: DC
  Administered 2018-08-08 – 2018-08-15 (×9): 3 mL via INTRAVENOUS

## 2018-08-08 MED ORDER — GABAPENTIN 600 MG PO TABS
600.0000 mg | ORAL_TABLET | Freq: Four times a day (QID) | ORAL | Status: DC
  Administered 2018-08-08 – 2018-08-15 (×28): 600 mg via ORAL
  Filled 2018-08-08 (×29): qty 1

## 2018-08-08 MED ORDER — METFORMIN HCL ER 500 MG PO TB24
500.0000 mg | ORAL_TABLET | Freq: Two times a day (BID) | ORAL | Status: DC
  Administered 2018-08-08: 500 mg via ORAL
  Filled 2018-08-08 (×2): qty 1

## 2018-08-08 MED ORDER — RIVAROXABAN 20 MG PO TABS
20.0000 mg | ORAL_TABLET | Freq: Every day | ORAL | Status: DC
  Administered 2018-08-08: 20 mg via ORAL
  Filled 2018-08-08: qty 1

## 2018-08-08 MED ORDER — SODIUM CHLORIDE 0.9% FLUSH: 3.0000 mL | INTRAVENOUS | Status: DC | PRN

## 2018-08-08 MED ORDER — VARENICLINE TARTRATE 1 MG PO TABS
1.0000 mg | ORAL_TABLET | Freq: Two times a day (BID) | ORAL | Status: DC
  Administered 2018-08-08 – 2018-08-15 (×16): 1 mg via ORAL
  Filled 2018-08-08 (×16): qty 1

## 2018-08-08 MED ORDER — FUROSEMIDE 10 MG/ML IJ SOLN: 40.0000 mg | Freq: Every day | INTRAMUSCULAR | Status: DC

## 2018-08-08 MED ORDER — DOCUSATE SODIUM 100 MG PO CAPS
100.0000 mg | ORAL_CAPSULE | Freq: Two times a day (BID) | ORAL | Status: DC
  Administered 2018-08-08 – 2018-08-14 (×12): 100 mg via ORAL
  Filled 2018-08-08 (×12): qty 1

## 2018-08-08 MED ORDER — ACETAMINOPHEN 325 MG PO TABS: 650.0000 mg | ORAL_TABLET | ORAL | Status: DC | PRN

## 2018-08-08 MED ORDER — AMITRIPTYLINE HCL 50 MG PO TABS
50.0000 mg | ORAL_TABLET | Freq: Every day | ORAL | Status: DC
  Administered 2018-08-08 – 2018-08-14 (×7): 50 mg via ORAL
  Filled 2018-08-08 (×7): qty 1

## 2018-08-08 NOTE — Progress Notes (Signed)
Anna Rowe is a 42 y.o. female with medical history significant of DM2, prior RLE DVT on chronic Xarelto now, ? CHF though no formal diagnosis her PCP put her on Lasix a few months back for leg swelling.  Patient presents to the ED with several week history of worsening leg swelling, especially worse over past 3-4 days with orthopnea and DOE.  She was admitted for evaluation of CHF.   Plan: 1. Continue with IV lasix, echocardiogram, continue with strict intake and output and daily weights. 2.  Cardiology will be consulted. 3. Continue with Xarelto for h/o DVT.  4. Pain meds for chronic pain syndrome.    Hosie Poisson, MD [phone]

## 2018-08-08 NOTE — ED Triage Notes (Signed)
Pt reports bilateral leg swelling, more to the right, that started 3 weeks ago. Pt reports it feels like her skin is about to pop. Pt was recently diagnosed with CHF 3 weeks ago, pt has been compliant with Lasix. Pt reports ambulating is difficult.

## 2018-08-08 NOTE — Progress Notes (Signed)
Lower extremity venous duplex completed. Refer to "CV Proc" under chart review to view preliminary results.  08/08/2018 3:42 PM Maudry Mayhew, MHA, RVT, RDCS, RDMS

## 2018-08-08 NOTE — Progress Notes (Signed)
  Echocardiogram 2D Echocardiogram has been performed.  Anna Rowe 08/08/2018, 12:13 PM

## 2018-08-08 NOTE — H&P (Signed)
History and Physical    Bernardina Cacho POE:[id number] DOB: November 29, 1975 DOA: 08/08/2018  [facility]P: [facility] [place]  Patient coming from: Home  I have personally briefly reviewed patient's old medical records in [place]  Chief Complaint: Leg swelling  HPI: Anna Rowe is a 42 y.o. female with medical history significant of DM2, prior RLE DVT on chronic Xarelto now, ? CHF though no formal diagnosis her PCP put her on Lasix a few months back for leg swelling.  Patient presents to the ED with several week history of worsening leg swelling, especially worse over past 3-4 days.  Worse in R leg.  Hurts to walk.  Associated orthopnea and DOE.  No CP.  No missed Xarelto doses.  Not helped despite taking home lasix.   ED Course: BNP 800, CXR shows pulmonary vascular congestion, EKG suggests LVH.  given 58m lasix IV x1.   Review of Systems: As per HPI otherwise 10 point review of systems negative.   Past Medical History:  Diagnosis Date  . Arthritis   . CHF (congestive heart failure) ([facility]   . COPD (chronic obstructive pulmonary disease) ([facility]   . Diabetes mellitus ([facility]    type 2   . DVT (deep venous thrombosis) ([facility] dx 10-2016   RLE   . HLD (hyperlipidemia)    on crestor  . Pneumonia    09/2016  . Sleep apnea    per patient ;been dx does not use CPAP does not tolerate    Past Surgical History:  Procedure Laterality Date  . ACHILLES TENDON REPAIR    . HERNIA REPAIR     x2; hiatal and umbilical   . IVC FILTER INSERTION N/A 04/25/2017   Procedure: IVC Filter Insertion;  Surgeon: BSerafina Mitchell MD;  [facility];  Service: Cardiovascular;  Laterality: N/A;  . IVC FILTER REMOVAL N/A 10/24/2017   Procedure: IVC FILTER REMOVAL;  Surgeon: BSerafina Mitchell MD;  [facility];  Service: Cardiovascular;  Laterality: N/A;  . JOINT REPLACEMENT     right hip 07/14/17 Dr. BNinfa Linden . KNEE ARTHROSCOPY Right 2015  .  plantar    . TOTAL HIP ARTHROPLASTY Left 04/28/2017   Procedure: LEFT TOTAL HIP ARTHROPLASTY ANTERIOR APPROACH;  Surgeon: BMcarthur Rossetti MD;  [facility];  Service: Orthopedics;  Laterality: Left;  . TOTAL HIP ARTHROPLASTY Right 07/14/2017   Procedure: RIGHT TOTAL HIP ARTHROPLASTY ANTERIOR APPROACH;  Surgeon: BMcarthur Rossetti MD;  [facility];  Service: Orthopedics;  Laterality: Right;  . TOTAL KNEE ARTHROPLASTY Right 04/04/2016   Procedure: TOTAL KNEE ARTHROPLASTY;  Surgeon: JDorna Leitz MD;  [facility]  Service: Orthopedics;  Laterality: Right;     reports that she has been smoking cigarettes. She has a 8.50 pack-year smoking history. She has never used smokeless tobacco. She reports that she does not drink alcohol or use drugs.  Allergies  Allergen Reactions  . Methocarbamol Anaphylaxis and Swelling  . Tramadol Anaphylaxis    Tolerates Dilaudid, Oxycontin 04/04/16  . Bee Venom Hives    No family history on file.   Prior to Admission medications   Medication Sig Start Date End Date Taking? Authorizing Provider  amitriptyline (ELAVIL) 50 MG tablet Take 50 mg by mouth at bedtime. 01/15/17  Yes [provider]  CHANTIX CONTINUING MONTH PAK 1 MG tablet Take 1 mg by mouth 2 (two) times daily. 01/29/17  Yes [provider]  cholecalciferol (VITAMIN D3) 25 MCG (  1000 UT) tablet Take 3,000 Units by mouth daily.   Yes [provider]  ciclopirox (PENLAC) 8 % solution Apply 1 application topically 2 (two) times daily as needed (onychomycosis).  03/24/17  Yes [provider]  clotrimazole-betamethasone (LOTRISONE) cream Apply 1 application topically 2 (two) times daily as needed (irritation).  01/31/17  Yes [provider]  cyclobenzaprine (FLEXERIL) 10 MG tablet Take 10 mg by mouth 3 (three) times daily. 01/14/17  Yes [provider]  docusate sodium (COLACE) 100 MG capsule Take 100 mg by mouth 2 (two) times daily.   02/09/16  Yes [provider]  furosemide (LASIX) 40 MG tablet Take 1 tablet (40 mg total) by mouth 2 (two) times daily. 07/22/18  Yes Virgel Manifold, MD  gabapentin (NEURONTIN) 600 MG tablet Take 600 mg by mouth 4 (four) times daily.  01/17/17  Yes [provider]  insulin regular (NOVOLIN R,HUMULIN R) 100 units/mL injection Inject 5-10 Units into the skin daily as needed for high blood sugar (depends on blood sugar readings if takes  5-10 units).  03/14/17 08/08/18 Yes [provider]  medroxyPROGESTERone (DEPO-PROVERA) 150 MG/ML injection Inject 150 mg every 3 (three) months into the muscle. Last dose 06/28/2018 01/20/17  Yes [provider]  meloxicam (MOBIC) 15 MG tablet Take 15 mg by mouth daily. 02/01/17  Yes [provider]  metFORMIN (GLUCOPHAGE-XR) 500 MG 24 hr tablet Take 500 mg 2 (two) times daily by mouth.  01/20/17  Yes [provider]  Multiple Vitamin (MULTIVITAMIN WITH MINERALS) TABS tablet Take 1 tablet by mouth daily.   Yes [provider]  oxyCODONE-acetaminophen (PERCOCET/ROXICET) 5-325 MG tablet Take 1 tablet by mouth 4 (four) times daily.   Yes [provider]  OXYCONTIN 10 MG 12 hr tablet Take 10 mg by mouth every 12 (twelve) hours. 02/03/17  Yes [provider]  polyethylene glycol powder (GLYCOLAX/MIRALAX) powder Take 17 g daily as needed by mouth for mild constipation. Averages once a month 02/09/16  Yes [provider]  potassium chloride SA (K-DUR,KLOR-CON) 20 MEQ tablet Take 1 tablet (20 mEq total) by mouth 2 (two) times daily. 07/22/18  Yes Virgel Manifold, MD  rosuvastatin (CRESTOR) 10 MG tablet Take 10 mg by mouth at bedtime. 01/20/17  Yes [provider]  XARELTO 20 MG TABS tablet Take 20 mg by mouth daily. 02/05/17  Yes [provider]    Physical Exam: Vitals:   08/08/18 0344 08/08/18 0430 08/08/18 0445 08/08/18 0530  BP:  104/84 137/87 120/89  Pulse:  (!) 103 (!) 52 (!)  103  Resp:  (!) 24 (!) 26   Temp: 98.1 F (36.7 C)     TempSrc: Oral     SpO2:  95% 94% 95%  Weight:      Height:        Constitutional: NAD, calm, comfortable Eyes: PERRL, lids and conjunctivae normal ENMT: Mucous membranes are moist. Posterior pharynx clear of any exudate or lesions.Normal dentition.  Neck: normal, supple, no masses, no thyromegaly Respiratory: clear to auscultation bilaterally, no wheezing, no crackles. Normal respiratory effort. No accessory muscle use.  Cardiovascular: Regular rate and rhythm, no murmurs / rubs / gallops. 3+ BLE edema 2+ pedal pulses. No carotid bruits.  Abdomen: no tenderness, no masses palpated. No hepatosplenomegaly. Bowel sounds positive.  Musculoskeletal: no clubbing / cyanosis. No joint deformity upper and lower extremities. Good ROM, no contractures. Normal muscle tone.  Skin: no rashes, lesions, ulcers. No induration Neurologic: CN 2-12 grossly  intact. Sensation intact, DTR normal. Strength 5/5 in all 4.  Psychiatric: Normal judgment and insight. Alert and oriented x 3. Normal mood.    Labs on Admission: I have personally reviewed following labs and imaging studies  CBC: Recent Labs  Lab 08/08/18 0057  WBC 8.9  NEUTROABS 3.9  HGB 13.9  HCT 45.9  MCV 70.5*  PLT 932   Basic Metabolic Panel: Recent Labs  Lab 08/08/18 0057  NA 138  K 4.2  CL 101  CO2 26  GLUCOSE 117*  BUN 17  CREATININE 1.23*  CALCIUM 9.5   GFR: Estimated Creatinine Clearance: 93.7 mL/min (A) (by C-G formula based on SCr of 1.23 mg/dL (H)). Liver Function Tests: No results for input(s): AST, ALT, ALKPHOS, BILITOT, PROT, ALBUMIN in the last 168 hours. No results for input(s): LIPASE, AMYLASE in the last 168 hours. No results for input(s): AMMONIA in the last 168 hours. Coagulation Profile: No results for input(s): INR, PROTIME in the last 168 hours. Cardiac Enzymes: No results for input(s): CKTOTAL, CKMB, CKMBINDEX, TROPONINI in the last 168  hours. BNP (last 3 results) No results for input(s): PROBNP in the last 8760 hours. HbA1C: No results for input(s): HGBA1C in the last 72 hours. CBG: No results for input(s): GLUCAP in the last 168 hours. Lipid Profile: No results for input(s): CHOL, HDL, LDLCALC, TRIG, CHOLHDL, LDLDIRECT in the last 72 hours. Thyroid Function Tests: No results for input(s): TSH, T4TOTAL, FREET4, T3FREE, THYROIDAB in the last 72 hours. Anemia Panel: No results for input(s): VITAMINB12, FOLATE, FERRITIN, TIBC, IRON, RETICCTPCT in the last 72 hours. Urine analysis:    Component Value Date/Time   COLORURINE YELLOW 07/22/2018 1729   APPEARANCEUR CLEAR 07/22/2018 1729   LABSPEC 1.015 07/22/2018 1729   PHURINE 6.0 07/22/2018 1729   GLUCOSEU NEGATIVE 07/22/2018 1729   HGBUR MODERATE (A) 07/22/2018 1729   BILIRUBINUR NEGATIVE 07/22/2018 1729   KETONESUR NEGATIVE 07/22/2018 1729   PROTEINUR NEGATIVE 07/22/2018 1729   UROBILINOGEN 1.0 12/13/2012 0230   NITRITE NEGATIVE 07/22/2018 1729   LEUKOCYTESUR TRACE (A) 07/22/2018 1729    Radiological Exams on Admission: Dg Chest 2 View  Result Date: 08/08/2018 CLINICAL DATA:  Acute onset of shortness of breath, cough and bilateral lower extremity fluid retention. EXAM: CHEST - 2 VIEW COMPARISON:  Chest radiograph performed 10/10/2016 FINDINGS: The lungs are well-aerated. Vascular congestion is noted. There is no evidence of focal opacification, pleural effusion or pneumothorax. The heart is enlarged.  No acute osseous abnormalities are seen. IMPRESSION: Vascular congestion and cardiomegaly. Lungs remain grossly clear. Electronically Signed   By: Garald Balding M.D.   On: 08/08/2018 01:30    EKG: Independently reviewed.  Assessment/Plan Principal Problem:   Acute diastolic CHF (congestive heart failure) (HCC) Active Problems:   Chronic bilateral low back pain   DM2 (diabetes mellitus, type 2) (HCC)   History of DVT (deep vein thrombosis)    1. Acute CHF,  probably diastolic - 1. CHF pathway 2. 2d echo 3. Tele monitor 4. Lasix 29m IV x1 in ED 1. Then 467mIV BID 5. Strict intake and output 6. BMP daily 2. Chronic pain - 1. Continue home Opiates, currently weaning these per pain med note on 12/20 3. DM2 - 1. Cont metformin 2. Mod scale SSI AC 4. H/o DVT - 1. Cont Xarelto 2. Will get BLE venous duplex to r/o DVT, but seems less likely given compliance with Xarelto  DVT prophylaxis: Xarelto Code Status: Full Family Communication: No family in room Disposition  Plan: Home after admit Consults called: None Admission status: Place in obs   Chevez Sambrano, [facility] Pager [phone] Only works nights!  If 7AM-7PM, please contact the primary day team physician taking care of patient  [email or web address] Password [facility]  08/08/2018, 5:51 AM

## 2018-08-08 NOTE — ED Provider Notes (Addendum)
[facility]ER[facility]ENCY DEPARTMENT Provider Note   CSN: [id number] Arrival date & time: 08/08/18  0002     History   Chief Complaint Chief Complaint  Patient presents with  . Leg Swelling    HPI Genelda Roark is a 42 y.o. female.  The history is provided by the patient.  Illness  This is a new problem. The current episode started more than 1 week ago. The problem occurs daily. The problem has been gradually worsening. Associated symptoms include shortness of breath. The symptoms are aggravated by walking. The symptoms are relieved by rest.  Patient presents with lower leg swelling. Reports she has had this for several weeks, but worse over the past 3 to 4 days.  She reports that it is worse in her right leg.  No recent trauma.  She does report it hurts to walk.  She does endorse orthopnea as well as dyspnea on exertion.  No active chest pain this time. Has been taking Lasix with minimal improvement She Is on Xarelto for DVT. Past Medical History:  Diagnosis Date  . Arthritis   . CHF (congestive heart failure) ([place])   . COPD (chronic obstructive pulmonary disease) ([place])   . Diabetes mellitus ([place])    type 2   . DVT (deep venous thrombosis) ([place]) dx 10-2016   RLE   . HLD (hyperlipidemia)    on crestor  . Pneumonia    09/2016  . Sleep apnea    per patient ;been dx does not use CPAP does not tolerate    Patient Active Problem List   Diagnosis Date Noted  . Unilateral primary osteoarthritis, left knee 08/24/2017  . History of total right knee replacement 08/24/2017  . Chronic bilateral low back pain 08/24/2017  . Status post total replacement of right hip 07/14/2017  . Status post total replacement of left hip 04/28/2017  . Unilateral primary osteoarthritis, left hip 03/09/2017  . Unilateral primary osteoarthritis, right hip 03/09/2017  . Primary osteoarthritis of right knee 04/04/2016    Past Surgical History:  Procedure Laterality  Date  . ACHILLES TENDON REPAIR    . HERNIA REPAIR     x2; hiatal and umbilical   . IVC FILTER INSERTION N/A 04/25/2017   Procedure: IVC Filter Insertion;  Surgeon: Serafina Mitchell, MD;  Location: [facility];  Service: Cardiovascular;  Laterality: N/A;  . IVC FILTER REMOVAL N/A 10/24/2017   Procedure: IVC FILTER REMOVAL;  Surgeon: Serafina Mitchell, MD;  Location: [facility];  Service: Cardiovascular;  Laterality: N/A;  . JOINT REPLACEMENT     right hip 07/14/17 Dr. Ninfa Linden  . KNEE ARTHROSCOPY Right 2015  . plantar    . TOTAL HIP ARTHROPLASTY Left 04/28/2017   Procedure: LEFT TOTAL HIP ARTHROPLASTY ANTERIOR APPROACH;  Surgeon: Mcarthur Rossetti, MD;  [facility];  Service: Orthopedics;  Laterality: Left;  . TOTAL HIP ARTHROPLASTY Right 07/14/2017   Procedure: RIGHT TOTAL HIP ARTHROPLASTY ANTERIOR APPROACH;  Surgeon: Mcarthur Rossetti, MD;  [facility];  Service: Orthopedics;  Laterality: Right;  . TOTAL KNEE ARTHROPLASTY Right 04/04/2016   Procedure: TOTAL KNEE ARTHROPLASTY;  Surgeon: Dorna Leitz, MD;  Location: [facility];  Service: Orthopedics;  Laterality: Right;     OB History   No obstetric history on file.      Home Medications    Prior to Admission medications   Medication Sig Start Date End Date Taking? Authorizing Provider  amitriptyline (ELAVIL) 50 MG tablet  Take 50 mg by mouth at bedtime. 01/15/17   [provider]  CHANTIX CONTINUING MONTH PAK 1 MG tablet Take 1 mg by mouth 2 (two) times daily. 01/29/17   [provider]  ciclopirox (PENLAC) 8 % solution Apply 1 application topically 2 (two) times daily as needed.  03/24/17   [provider]  clotrimazole-betamethasone (LOTRISONE) cream Apply 1 application topically 2 (two) times daily as needed (irritation).  01/31/17   [provider]  cyclobenzaprine (FLEXERIL) 10 MG tablet Take 10 mg by mouth 3 (three) times daily. 01/14/17   [provider]    docusate sodium (COLACE) 100 MG capsule Take 100 mg by mouth 2 (two) times daily.  02/09/16   [provider]  furosemide (LASIX) 40 MG tablet Take 1 tablet (40 mg total) by mouth 2 (two) times daily. 07/22/18   Virgel Manifold, MD  gabapentin (NEURONTIN) 600 MG tablet Take 600 mg by mouth 3 (three) times daily. 01/17/17   [provider]  insulin regular (NOVOLIN R,HUMULIN R) 100 units/mL injection Inject 5-10 Units into the skin daily as needed for high blood sugar (depends on blood sugar readings if takes  5-10 units).  03/14/17 03/14/18  [provider]  medroxyPROGESTERone (DEPO-PROVERA) 150 MG/ML injection Inject 150 mg every 3 (three) months into the muscle. Last dose 06/28/2018 01/20/17   [provider]  meloxicam (MOBIC) 15 MG tablet Take 15 mg by mouth daily. 02/01/17   [provider]  metFORMIN (GLUCOPHAGE-XR) 500 MG 24 hr tablet Take 500 mg 2 (two) times daily by mouth.  01/20/17   [provider]  Multiple Vitamin (MULTIVITAMIN WITH MINERALS) TABS tablet Take 1 tablet by mouth daily.    [provider]  [facility] VERIO test strip 3 (three) times daily. for testing 03/01/17   [provider]  oxyCODONE (OXY IR/ROXICODONE) 5 MG immediate release tablet Take 5 mg by mouth every 4 (four) hours as needed for severe pain.    [provider]  oxycodone (OXY-IR) 5 MG capsule Take 10 mg by mouth every 4 (four) hours as needed.    [provider]  OXYCONTIN 10 MG 12 hr tablet Take 10 mg by mouth every 12 (twelve) hours. 02/03/17   [provider]  polyethylene glycol powder (GLYCOLAX/MIRALAX) powder Take 17 g daily as needed by mouth for mild constipation. Averages once a month 02/09/16   [provider]  potassium chloride SA (K-DUR,KLOR-CON) 20 MEQ tablet Take 1 tablet (20 mEq total) by mouth 2 (two) times daily. 07/22/18   Virgel Manifold, MD  rosuvastatin (CRESTOR) 10 MG tablet Take 10 mg by mouth at  bedtime. 01/20/17   [provider]  XARELTO 20 MG TABS tablet Take 20 mg by mouth daily. 02/05/17   [provider]    Family History No family history on file.  Social History Social History   Tobacco Use  . Smoking status: Current Every Day Smoker    Packs/day: 0.50    Years: 17.00    Pack years: 8.50    Types: Cigarettes  . Smokeless tobacco: Never Used  . Tobacco comment: 3 Loose cigarettes in last week  Substance Use Topics  . Alcohol use: No    Comment: seldom   . Drug use: No     Allergies   Methocarbamol; Tramadol; and Bee venom   Review of Systems Review of Systems  Constitutional: Positive for fatigue. Negative for fever.  Respiratory: Positive for shortness of breath.  Cardiovascular: Positive for leg swelling.  Gastrointestinal: Negative for vomiting.  Musculoskeletal: Positive for arthralgias.  All other systems reviewed and are negative.    Physical Exam Updated Vital Signs BP (!) 124/104   Pulse (!) 102   Resp (!) 26   Ht 1.829 m (6')   Wt (!) 139.3 kg   SpO2 96%   BMI 41.64 kg/m   Physical Exam CONSTITUTIONAL: Well developed/well nourished HEAD: Normocephalic/atraumatic EYES: EOMI/PERRL ENMT: Mucous membranes moist NECK: supple no meningeal signs SPINE/BACK:entire spine nontender CV: S1/S2 noted LUNGS: Coarse breath sounds bilaterally, wheezing noted, tachypnea ABDOMEN: soft, nontender NEURO: Pt is awake/alert/appropriate, moves all extremitiesx4.  No facial droop.   EXTREMITIES: pulses normal/equal, full ROM, significant  pitting edema to lower extremities, right> left No convincing signs of cellulitis.  No open wounds are noted.  Distal pulses are equal and intact SKIN: warm, color normal PSYCH: no abnormalities of mood noted, alert and oriented to situation   ED Treatments / Results  Labs (all labs ordered are listed, but only abnormal results are displayed) Labs Reviewed  BASIC METABOLIC PANEL - Abnormal;  Notable for the following components:      Result Value   Glucose, Bld 117 (*)    Creatinine, Ser 1.23 (*)    GFR calc non Af Amer 54 (*)    All other components within normal limits  CBC WITH DIFFERENTIAL/PLATELET - Abnormal; Notable for the following components:   RBC 6.51 (*)    MCV 70.5 (*)    MCH 21.4 (*)    RDW 17.9 (*)    Lymphs Abs 4.2 (*)    All other components within normal limits  BRAIN NATRIURETIC PEPTIDE - Abnormal; Notable for the following components:   B Natriuretic Peptide 810.5 (*)    All other components within normal limits  HIV ANTIBODY (ROUTINE TESTING W REFLEX)    EKG EKG Interpretation  Date/Time:  Wednesday August 08 2018 01:55:06 EST Ventricular Rate:  102 PR Interval:    QRS Duration: 103 QT Interval:  369 QTC Calculation: 481 R Axis:   -40 Text Interpretation:  Sinus tachycardia Biatrial enlargement Abnormal R-wave progression, early transition Left ventricular hypertrophy Confirmed by Ripley Fraise [phone]) on 08/08/2018 1:58:21 AM   Radiology Dg Chest 2 View  Result Date: 08/08/2018 CLINICAL DATA:  Acute onset of shortness of breath, cough and bilateral lower extremity fluid retention. EXAM: CHEST - 2 VIEW COMPARISON:  Chest radiograph performed 10/10/2016 FINDINGS: The lungs are well-aerated. Vascular congestion is noted. There is no evidence of focal opacification, pleural effusion or pneumothorax. The heart is enlarged.  No acute osseous abnormalities are seen. IMPRESSION: Vascular congestion and cardiomegaly. Lungs remain grossly clear. Electronically Signed   By: Garald Balding M.D.   On: 08/08/2018 01:30    Procedures Procedures (including critical care time)  Medications Ordered in ED Medications  furosemide (LASIX) injection 80 mg (has no administration in time range)  nitroGLYCERIN (NITROGLYN) 2 % ointment 1 inch (has no administration in time range)     Initial Impression / Assessment and Plan / ED Course  I have reviewed  the triage vital signs and the nursing notes.  Pertinent labs & imaging results that were available during my care of the patient were reviewed by me and considered in my medical decision making (see chart for details).     3:08 AM Patient presents with increasing lower extremity edema right greater than left.  She also endorses orthopnea and DOE On further discussion, patient has  never been given a formal diagnosis of CHF.  She reports over 2 months ago her PCP did an echocardiogram was told she had enlarged heart but no other issues.  Since that time her peripheral edema as well as dyspnea and cough have worsened. She had been seen in the ER at [facility] on December 8 for peripheral edema and refused admission at that time for  probable CHF  5:46 AM Discussed the case with Dr. Alcario Drought for admission. Feel she would benefit for inpatient admission monitoring, echocardiogram and diuresis Final Clinical Impressions(s) / ED Diagnoses   Final diagnoses:  Peripheral edema  Acute systolic congestive heart failure [facility])    ED Discharge Orders    None       Ripley Fraise, MD 08/08/18 Waymon Amato    Ripley Fraise, MD 08/08/18 [phone]

## 2018-08-09 ENCOUNTER — Ambulatory Visit (HOSPITAL_COMMUNITY): Payer: BC Managed Care – PPO

## 2018-08-09 DIAGNOSIS — I5023 Acute on chronic systolic (congestive) heart failure: Secondary | ICD-10-CM

## 2018-08-09 DIAGNOSIS — I110 Hypertensive heart disease with heart failure: Secondary | ICD-10-CM | POA: Diagnosis present

## 2018-08-09 DIAGNOSIS — M545 Low back pain: Secondary | ICD-10-CM | POA: Diagnosis present

## 2018-08-09 DIAGNOSIS — R0602 Shortness of breath: Secondary | ICD-10-CM | POA: Diagnosis not present

## 2018-08-09 DIAGNOSIS — E785 Hyperlipidemia, unspecified: Secondary | ICD-10-CM | POA: Diagnosis present

## 2018-08-09 DIAGNOSIS — N179 Acute kidney failure, unspecified: Secondary | ICD-10-CM | POA: Diagnosis present

## 2018-08-09 DIAGNOSIS — I5021 Acute systolic (congestive) heart failure: Secondary | ICD-10-CM | POA: Diagnosis not present

## 2018-08-09 DIAGNOSIS — I472 Ventricular tachycardia: Secondary | ICD-10-CM | POA: Diagnosis present

## 2018-08-09 DIAGNOSIS — J449 Chronic obstructive pulmonary disease, unspecified: Secondary | ICD-10-CM | POA: Diagnosis present

## 2018-08-09 DIAGNOSIS — I5031 Acute diastolic (congestive) heart failure: Secondary | ICD-10-CM | POA: Diagnosis not present

## 2018-08-09 DIAGNOSIS — K5909 Other constipation: Secondary | ICD-10-CM | POA: Diagnosis present

## 2018-08-09 DIAGNOSIS — I5043 Acute on chronic combined systolic (congestive) and diastolic (congestive) heart failure: Secondary | ICD-10-CM | POA: Diagnosis present

## 2018-08-09 DIAGNOSIS — Z6841 Body Mass Index (BMI) 40.0 and over, adult: Secondary | ICD-10-CM | POA: Diagnosis not present

## 2018-08-09 DIAGNOSIS — R570 Cardiogenic shock: Secondary | ICD-10-CM | POA: Diagnosis present

## 2018-08-09 DIAGNOSIS — G894 Chronic pain syndrome: Secondary | ICD-10-CM | POA: Diagnosis present

## 2018-08-09 DIAGNOSIS — I2510 Atherosclerotic heart disease of native coronary artery without angina pectoris: Secondary | ICD-10-CM | POA: Diagnosis present

## 2018-08-09 DIAGNOSIS — M7989 Other specified soft tissue disorders: Secondary | ICD-10-CM | POA: Diagnosis present

## 2018-08-09 DIAGNOSIS — I493 Ventricular premature depolarization: Secondary | ICD-10-CM | POA: Diagnosis present

## 2018-08-09 DIAGNOSIS — I428 Other cardiomyopathies: Secondary | ICD-10-CM | POA: Diagnosis present

## 2018-08-09 DIAGNOSIS — I509 Heart failure, unspecified: Secondary | ICD-10-CM

## 2018-08-09 DIAGNOSIS — F17210 Nicotine dependence, cigarettes, uncomplicated: Secondary | ICD-10-CM | POA: Diagnosis present

## 2018-08-09 DIAGNOSIS — R609 Edema, unspecified: Secondary | ICD-10-CM | POA: Diagnosis not present

## 2018-08-09 DIAGNOSIS — G4733 Obstructive sleep apnea (adult) (pediatric): Secondary | ICD-10-CM | POA: Diagnosis present

## 2018-08-09 DIAGNOSIS — E1165 Type 2 diabetes mellitus with hyperglycemia: Secondary | ICD-10-CM | POA: Diagnosis present

## 2018-08-09 LAB — BASIC METABOLIC PANEL
Anion gap: 14 (ref 5–15)
BUN: 16 mg/dL (ref 6–20)
CO2: 24 mmol/L (ref 22–32)
Calcium: 8.9 mg/dL (ref 8.9–10.3)
Chloride: 101 mmol/L (ref 98–111)
Creatinine, Ser: 1.11 mg/dL — ABNORMAL HIGH (ref 0.44–1.00)
GFR calc Af Amer: >60 mL/min (ref >60)
GFR calc non Af Amer: >60 mL/min (ref >60)
Glucose, Bld: 115 mg/dL — ABNORMAL HIGH (ref 70–99)
Potassium: 4 mmol/L (ref 3.5–5.1)
Sodium: 139 mmol/L (ref 135–145)

## 2018-08-09 LAB — GLUCOSE, CAPILLARY
Glucose-Capillary: 121 mg/dL — ABNORMAL HIGH (ref 70–99)
Glucose-Capillary: 169 mg/dL — ABNORMAL HIGH (ref 70–99)
Glucose-Capillary: 125 mg/dL — ABNORMAL HIGH (ref 70–99)

## 2018-08-09 LAB — ECHOCARDIOGRAM LIMITED
Height: 72 in
Weight: 5100.8 oz

## 2018-08-09 LAB — LIPID PANEL
Cholesterol: 100 mg/dL (ref 0–200)
HDL: 20 mg/dL — ABNORMAL LOW (ref >40)
LDL Cholesterol: 61 mg/dL (ref 0–99)
Total CHOL/HDL Ratio: 5 RATIO
Triglycerides: 94 mg/dL (ref <150)
VLDL: 19 mg/dL (ref 0–40)

## 2018-08-09 LAB — HIV ANTIBODY (ROUTINE TESTING W REFLEX): HIV Screen 4th Generation wRfx: NONREACTIVE

## 2018-08-09 MED ORDER — SODIUM CHLORIDE 0.9% FLUSH
3.0000 mL | Freq: Two times a day (BID) | INTRAVENOUS | Status: DC
  Administered 2018-08-09: 3 mL via INTRAVENOUS

## 2018-08-09 MED ORDER — SODIUM CHLORIDE 0.9 % IV SOLN
INTRAVENOUS | Status: DC
  Administered 2018-08-10: 07:00:00 via INTRAVENOUS

## 2018-08-09 MED ORDER — LOSARTAN POTASSIUM 25 MG PO TABS
25.0000 mg | ORAL_TABLET | Freq: Every day | ORAL | Status: DC
  Administered 2018-08-09 – 2018-08-12 (×4): 25 mg via ORAL
  Filled 2018-08-09 (×4): qty 1

## 2018-08-09 MED ORDER — IPRATROPIUM-ALBUTEROL 0.5-2.5 (3) MG/3ML IN SOLN
3.0000 mL | Freq: Three times a day (TID) | RESPIRATORY_TRACT | Status: DC
  Administered 2018-08-10 – 2018-08-15 (×14): 3 mL via RESPIRATORY_TRACT
  Filled 2018-08-09 (×12): qty 3

## 2018-08-09 MED ORDER — MOMETASONE FURO-FORMOTEROL FUM 200-5 MCG/ACT IN AERO
2.0000 | INHALATION_SPRAY | Freq: Two times a day (BID) | RESPIRATORY_TRACT | Status: DC
  Administered 2018-08-09 – 2018-08-15 (×9): 2 via RESPIRATORY_TRACT
  Filled 2018-08-09 (×2): qty 8.8

## 2018-08-09 MED ORDER — ASPIRIN 81 MG PO CHEW
81.0000 mg | CHEWABLE_TABLET | ORAL | Status: AC
  Administered 2018-08-10: 81 mg via ORAL
  Filled 2018-08-09: qty 1

## 2018-08-09 MED ORDER — SODIUM CHLORIDE 0.9% FLUSH: 3.0000 mL | INTRAVENOUS | Status: DC | PRN

## 2018-08-09 MED ORDER — PERFLUTREN LIPID MICROSPHERE
1.0000 mL | INTRAVENOUS | Status: AC | PRN
  Administered 2018-08-09: 2 mL via INTRAVENOUS
  Filled 2018-08-09: qty 10

## 2018-08-09 MED ORDER — IPRATROPIUM-ALBUTEROL 0.5-2.5 (3) MG/3ML IN SOLN
3.0000 mL | Freq: Four times a day (QID) | RESPIRATORY_TRACT | Status: DC
  Administered 2018-08-09 (×2): 3 mL via RESPIRATORY_TRACT
  Filled 2018-08-09 (×2): qty 3

## 2018-08-09 MED ORDER — IPRATROPIUM-ALBUTEROL 0.5-2.5 (3) MG/3ML IN SOLN: 3.0000 mL | Freq: Four times a day (QID) | RESPIRATORY_TRACT | Status: DC | PRN

## 2018-08-09 MED ORDER — CARVEDILOL 3.125 MG PO TABS
3.1250 mg | ORAL_TABLET | Freq: Two times a day (BID) | ORAL | Status: DC
  Administered 2018-08-09 – 2018-08-10 (×2): 3.125 mg via ORAL
  Filled 2018-08-09 (×2): qty 1

## 2018-08-09 MED ORDER — SODIUM CHLORIDE 0.9 % IV SOLN: 250.0000 mL | INTRAVENOUS | Status: DC | PRN

## 2018-08-09 NOTE — H&P (View-Only) (Signed)
Cardiology Consult    Patient ID: Khadija Thier MRN: [id number], DOB/AGE: 1976-07-01   Admit date: 08/08/2018 Date of Consult: 08/09/2018  Primary Physician: [place] Primary Cardiologist: No primary care provider on file. Requesting Provider: Hosie Poisson, MD  Patient Profile    Anna Rowe is a 42 y.o. 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 Karleen Hampshire.  History of Present Illness    Patient is a 42 year old female with the above history. Patient presented to [facility] ED on 07/22/2018 for evaluation of worsening peripheral edema. She contributed this to having a CT scan with contrast the week prior. She was prescribed Lasix by her PCP but did not notice any improvement. She also reported chronic dyspnea at that time which is why she had the recent CT scan. Patient refused to have an EKG or chest x-ray. Troponin was elevated at 0.08 and BNP was elevated at 1,149.2. Patient was treated with Lasix in the ED. She was informed she was in acute heart failure. MD recommended admission for further testing/managment but patient refused and left AMA. Home Lasix was increased to 91m twice daily at that time.  [facility] of worsening leg swelling over the last several weeks but especially over the last 3-4 days.  Patient reports it all started when she developed a cough and shortness of breath in October. She saw her PCP multiple times for this who recommended over-the-counter medications such as Mucinex. PCP ended up referring the patient to a Pulmonologist who eventually diagnosed her with COPD. The Pulmonologist recommended patient have a CT scan which she had done on 07/18/2018. Since the CT scan, patient has  reported worsening leg swelling and decreased urination. The lower extremity swelling significantly worsened the last several days. The swelling in her right leg is worse than that in her left to the point where it hurt to walk. She has also noticed some swelling in her abdomen. Patient reports some recent PND and states she has never been able to lay flat. She denies any chest pain, lightheadedness, dizziness, or abnormal bruising/bleeding. She has noticed some palpitations over the last 1.5 months. She has never seen a Cardiologist but [facility]   Upon arrival to the ED, patient tachypneic and tachycardic. EKG showed sinus tachycardia, rate 102 bpm, with LVH but no acute ischemic changes. Chest x-ray showed vascular congestion and cardiomegaly but lungs grossly clear. BNP elevated at 810.5. WBC 8.9, Hgb 13.9, Plts 397. Na 138, K 4.2, Glucose 117, SCr 1.23. Patient was admitted for acute CHF and was started on IV Lasix.   Currently, patient notes some improvement with IV diuresis.   Patient has a 20 year smoking history and currently smokes 1 pack per day. She does have a family history of heart disease on her dad side with her father having a CHF and CAD.  Past Medical History   Past Medical History:  Diagnosis Date  . Arthritis   . [place]congestive heart failure) ([place]   . COPD ([place] obstructive pulmonary disease) ([place]   . Diabetes mellitus ([place]    type 2   . DVT (deep venous thrombosis) ([facility]   RLE   .  HLD (hyperlipidemia)    on crestor  . Pneumonia    09/2016  . Sleep apnea    per patient ;been dx does not use CPAP does not tolerate    Past Surgical History:  Procedure Laterality Date  . ACHILLES TENDON REPAIR    . HERNIA REPAIR     x2; hiatal and umbilical   . IVC FILTER INSERTION N/A 04/25/2017   Procedure: IVC Filter Insertion;  Surgeon: Serafina Mitchell, MD;  Location: [place] [facility];  Service:  Cardiovascular;  Laterality: N/A;  . IVC FILTER REMOVAL N/A 10/24/2017   Procedure: IVC FILTER REMOVAL;  Surgeon: Serafina Mitchell, MD;  Location: [place] [facility];  Service: Cardiovascular;  Laterality: N/A;  . JOINT REPLACEMENT     right hip 07/14/17 Dr. Ninfa Linden  . KNEE ARTHROSCOPY Right 2015  . plantar    . TOTAL HIP ARTHROPLASTY Left 04/28/2017   Procedure: LEFT TOTAL HIP ARTHROPLASTY ANTERIOR APPROACH;  Surgeon: Mcarthur Rossetti, MD;  Location: [facility];  Service: Orthopedics;  Laterality: Left;  . TOTAL HIP ARTHROPLASTY Right 07/14/2017   Procedure: RIGHT TOTAL HIP ARTHROPLASTY ANTERIOR APPROACH;  Surgeon: Mcarthur Rossetti, MD;  Location: [facility];  Service: Orthopedics;  Laterality: Right;  . TOTAL KNEE ARTHROPLASTY Right 04/04/2016   Procedure: TOTAL KNEE ARTHROPLASTY;  Surgeon: Dorna Leitz, MD;  Location: [place];  Service: Orthopedics;  Laterality: Right;     Allergies  Allergies  Allergen Reactions  . Methocarbamol Anaphylaxis and Swelling  . Tramadol Anaphylaxis    Tolerates Dilaudid, Oxycontin 04/04/16  . Bee Venom Hives    Inpatient Medications    . amitriptyline  50 mg Oral QHS  . cyclobenzaprine  10 mg Oral TID  . docusate sodium  100 mg Oral BID  . furosemide  40 mg Intravenous BID  . gabapentin  600 mg Oral QID  . insulin aspart  0-15 Units Subcutaneous TID WC  . ipratropium-albuterol  3 mL Nebulization Q6H  . mometasone-formoterol  2 puff Inhalation BID  . multivitamin with minerals  1 tablet Oral Daily  . oxyCODONE  10 mg Oral Q12H  . rivaroxaban  20 mg Oral QAC supper  . rosuvastatin  10 mg Oral QHS  . sodium chloride flush  3 mL Intravenous Q12H  . varenicline  1 mg Oral BID WC    Family History    Family History  Problem Relation Age of Onset  . Hypertension Mother   . Hypertension Father   . Hypertension Sister    She indicated that the status of her mother is unknown. She indicated that the status of her father is unknown. She  indicated that the status of her sister is unknown.   Social History    Social History   Socioeconomic History  . Marital status: Married    Spouse name: Not on file  . Number of children: Not on file  . Years of education: Not on file  . Highest education level: Not on file  Occupational History  . Not on file  Social Needs  . Financial resource strain: Not on file  . Food insecurity:    Worry: Not on file    Inability: Not on file  . Transportation needs:    Medical: Not on file    Non-medical: Not on file  Tobacco Use  . Smoking status: Current Every Day Smoker    Packs/day: 0.50    Years: 17.00    Pack years: 8.50  Types: Cigarettes  . Smokeless tobacco: Never Used  . Tobacco comment: 3 Loose cigarettes in last week  Substance and Sexual Activity  . Alcohol use: No    Comment: seldom   . Drug use: No  . Sexual activity: Not Currently  Lifestyle  . Physical activity:    Days per week: Not on file    Minutes per session: Not on file  . Stress: Not on file  Relationships  . Social connections:    Talks on phone: Not on file    Gets together: Not on file    Attends religious service: Not on file    Active member of club or organization: Not on file    Attends meetings of clubs or organizations: Not on file    Relationship status: Not on file  . Intimate partner violence:    Fear of current or ex partner: Not on file    Emotionally abused: Not on file    Physically abused: Not on file    Forced sexual activity: Not on file  Other Topics Concern  . Not on file  Social History Narrative  . Not on file     Review of Systems    Review of Systems  Constitutional: Negative for chills and fever.  HENT: Negative for congestion and sore throat.   Respiratory: Positive for cough, sputum production and shortness of breath. Negative for hemoptysis.   Cardiovascular: Positive for palpitations, orthopnea, leg swelling and PND. Negative for chest pain.    Gastrointestinal: Negative for blood in stool, nausea and vomiting.  Genitourinary: Negative for hematuria. Frequency: decrease.  Musculoskeletal: Negative for myalgias.       Chronic right left pain  Neurological: Negative for dizziness.  Endo/Heme/Allergies: Does not bruise/bleed easily.  Psychiatric/Behavioral: Positive for substance abuse (tobacco use).    Physical Exam    Blood pressure (!) 129/53, pulse 100, temperature (!) 97.3 F (36.3 C), temperature source Oral, resp. rate 18, height 6' (1.829 m), weight (!) 144.6 kg, SpO2 97 %.  General: 42 y.o.  obese African-American female resting comfortably in no acute distress. Pleasant and cooperative. HEENT: Normal  Neck: Supple. No carotid bruits. Difficult to assess JVD due to body habitus. Lungs: No increased work of breathing. Decreased breath sounds with mild crackles noted in bilateral bases and scattered wheezes. Heart: Mildly tachycardic with regular rhythm. Distinct S1 and S2. No significant murmurs, gallops, or rubs.  Abdomen: Soft, obese, and non-tender to palpation. Bowel sounds present. Extremities: 2-3+ pitting edema in bilateral lower extremities. Radial pulses 2+ and equal bilaterally. Neuro: Alert and oriented x3. No focal deficits. Moves all extremities spontaneously. Psych: Normal affect.  Labs    Troponin (Point of Care Test) No results for input(s): TROPIPOC in the last 72 hours. No results for input(s): CKTOTAL, CKMB, TROPONINI in the last 72 hours. Lab Results  Component Value Date   WBC 8.9 08/08/2018   HGB 13.9 08/08/2018   HCT 45.9 08/08/2018   MCV 70.5 (L) 08/08/2018   PLT 397 08/08/2018    Recent Labs  Lab 08/09/18 0354  NA 139  K 4.0  CL 101  CO2 24  BUN 16  CREATININE 1.11*  CALCIUM 8.9  GLUCOSE 115*   Lab Results  Component Value Date   CHOL 100 08/09/2018   HDL 20 (L) 08/09/2018   LDLCALC 61 08/09/2018   TRIG 94 08/09/2018   No results found for: [facility]   Radiology Studies     Dg Chest 2 View  Result Date: 08/08/2018 CLINICAL DATA:  Acute onset of shortness of breath, cough and bilateral lower extremity fluid retention. EXAM: CHEST - 2 VIEW COMPARISON:  Chest radiograph performed 10/10/2016 FINDINGS: The lungs are well-aerated. Vascular congestion is noted. There is no evidence of focal opacification, pleural effusion or pneumothorax. The heart is enlarged.  No acute osseous abnormalities are seen. IMPRESSION: Vascular congestion and cardiomegaly. Lungs remain grossly clear. Electronically Signed   By: Garald Balding M.D.   On: 08/08/2018 01:30   Vas [place] Lower Extremity Venous (dvt)  Result Date: 08/08/2018  Lower Venous Study Indications: History of DVT, and Swelling.  Performing Technologist: Maudry Mayhew MHA, RDMS, RVT, RDCS  Examination Guidelines: A complete evaluation includes B-mode imaging, spectral Doppler, color Doppler, and power Doppler as needed of all accessible portions of each vessel. Bilateral testing is considered an integral part of a complete examination. Limited examinations for reoccurring indications may be performed as noted.  Right Venous Findings: +---------+---------------+---------+-----------+----------+--------------+          CompressibilityPhasicitySpontaneityPropertiesSummary        +---------+---------------+---------+-----------+----------+--------------+ CFV      Full           Yes      No                                  +---------+---------------+---------+-----------+----------+--------------+ SFJ      Full                                                        +---------+---------------+---------+-----------+----------+--------------+ FV Prox  Full                                                        +---------+---------------+---------+-----------+----------+--------------+ FV Mid   Full                                                         +---------+---------------+---------+-----------+----------+--------------+ FV DistalFull                                                        +---------+---------------+---------+-----------+----------+--------------+ PFV      Full                                                        +---------+---------------+---------+-----------+----------+--------------+ POP      Full           Yes      Yes                                 +---------+---------------+---------+-----------+----------+--------------+  PTV                                                   Not visualized +---------+---------------+---------+-----------+----------+--------------+ PERO                                                  Not visualized +---------+---------------+---------+-----------+----------+--------------+  Left Venous Findings: +---------+---------------+---------+-----------+----------+--------------+          CompressibilityPhasicitySpontaneityPropertiesSummary        +---------+---------------+---------+-----------+----------+--------------+ CFV      Full           Yes      Yes                                 +---------+---------------+---------+-----------+----------+--------------+ SFJ      Full                                                        +---------+---------------+---------+-----------+----------+--------------+ FV Prox  Full                                                        +---------+---------------+---------+-----------+----------+--------------+ FV Mid   Full                                                        +---------+---------------+---------+-----------+----------+--------------+ FV DistalFull                                                        +---------+---------------+---------+-----------+----------+--------------+ PFV      Full                                                         +---------+---------------+---------+-----------+----------+--------------+ POP      Full           Yes      Yes                                 +---------+---------------+---------+-----------+----------+--------------+ PTV      Full                                                        +---------+---------------+---------+-----------+----------+--------------+  PERO                                                  Not visualized +---------+---------------+---------+-----------+----------+--------------+    Summary: Right: There is no evidence of deep vein thrombosis in the lower extremity. However, portions of this examination were limited- see technologist comments above. No cystic structure found in the popliteal fossa. Left: There is no evidence of deep vein thrombosis in the lower extremity. However, portions of this examination were limited- see technologist comments above. No cystic structure found in the popliteal fossa.  *See table(s) above for measurements and observations.    Preliminary     EKG     EKG: EKG was personally reviewed and demonstrates: Sinus tachycardia, rate 102 bpm, with LVH but no acute ischemic changes.  Telemetry: Telemetry was personally reviewed and demonstrates: Sinus tachycardia with heart rate mostly in the low 100's and frequent PVC. Short run of non-sustained VT up to 5 beats was also noted.  Cardiac Imaging   Echocardiogram 08/08/2018: Study Conclusions: - Left ventricle: The cavity size was mildly dilated. The estimated   ejection fraction was in the range of 15% to 20%. Severe diffuse   hypokinesis and septal dyskinesis. Features are consistent with a   pseudonormal left ventricular filling pattern, with concomitant   abnormal relaxation and increased filling pressure (grade 2   diastolic dysfunction). Doppler parameters are consistent with   elevated ventricular end-diastolic filling pressure. - Ventricular septum: Septal motion  showed paradox. - Aortic valve: Valve area (VTI): 1.65 cm^2. Valve area (Vmax):   1.77 cm^2. Valve area (Vmean): 1.73 cm^2. - Mitral valve: There was moderate regurgitation. - Left atrium: The atrium was moderately dilated. - Right ventricle: The cavity size was moderately dilated. Wall   thickness was normal. Systolic function was moderately reduced. - Pulmonary arteries: Systolic pressure was mildly increased. PA   peak pressure: 35 mm Hg (S).  Impressions: - No prior study available for comparison and poor image quality   sec to patient&'s size.   However, there is severe LV systolic dysfunction with severe   global hypokinesis and septal dyssynchrony, LVEF 15-20%. Apical   segments are foreshortened, additional images with Definity echo   contrast should be performed to rule out possible thrombus/?   hypertrabeculations.   RV is poorly visualized but systolic function appears to be at   least moderately decreased.  Assessment & Plan    Acute Systolic and Diastolic CHF - Patient presented with worsening lower extremity edema over the last several weeks. - BNP elevated at 810.5.  - Echo showed LVEF of 15-20% with severe diffuse hypokinesis and septal dyskinesis, grade 2 diastolic dysfunction, and mildly elevated PASP of 35 mmHg. Patient states she had an Echo at her PCP in 05/2018 and reports it was normal at that time. - Patient diuresing well. Documented output of 3.5 L in the past 24 hours with net negative of 6.4 LL since admission.  - Serum creatinine was elevated at 1.23 on admission (baseline appears to be around 0.70 to 0.90) but has improved some with diuresis. - Continue IV Lasix 61m twice daily. - Will add Coreg 3.125 twice daily.  - Will add Losartan 219mdaily. Patient would be a good candidate for EnDelene Loll consider transitioning prior to discharge. - Patient will likely need ischemic evaluation at some  point given newly reduced EF. Discussed with MD - will proceed  with left heart catheterization tomorrow. Will get repeat limited Echo with definitiy to assess for ventricular thrombus prior to cath.  Sinus Tachycardia - Telemetry shows sinus tachycardia with heart rate mostly in the low 100's with frequent PVC. Also noted brief run of non-sustained VT of 5 beats.  - Will start Coreg 3.150m twice daily.  Chronic DVT  - Patient has a history of chronic DVT initially diagnosed in 2018. She is on anticoagulation with Xarelto. - Patient presented with worsening lower extremity edema (right > left) - Lower extremity dopplers showed no evidence of DVT.  Signed, CDarreld Mclean PA-C 08/09/2018, 1:00 PM  For questions or updates, please contact   Please consult [email or web address] for contact info under Cardiology/STEMI.  Attending Note:   The patient was seen and examined.  Agree with assessment and plan as noted above.  Changes made to the above note as needed.  Patient seen and independently examined with  CSande Rives PA .   We discussed all aspects of the encounter. I agree with the assessment and plan as stated above.  1.   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  Echocardiogram reveals markedly reduced left ventricular systolic function with an ejection fraction of 10 to 15%. He is diuresed nicely and is feeling much better. She need a heart catheterization.  We will see if she is ready for heart cath tomorrow.  Otherwise we will schedule it for Monday.  Continue Lasix.  Start carvedilol 3.125 mg twice a day and losartan 25  mg a day.  We have discussed the risks, benefits, options of heart catheterization.  She understands and agrees to proceed.  Will hold her xarelto on the day prior to her cath .    I have spent a total of 40 minutes with patient reviewing hospital  notes , telemetry, EKGs, labs and examining patient as well as establishing an  assessment and plan that was discussed with the patient. > 50% of time was spent in direct patient care.    PThayer Headings JBrooke Bonito, MD, FM[facility]lesex Surgery Center12/26/2019, 3:48 PM [address]  SEwa BeachPager [phone]

## 2018-08-09 NOTE — Progress Notes (Signed)
  Echocardiogram 2D Echocardiogram has been performed.  Jennette Dubin 08/09/2018, 4:03 PM

## 2018-08-09 NOTE — Plan of Care (Signed)

## 2018-08-09 NOTE — Progress Notes (Signed)
RT instructed pt on the use incentive spirometer.  Pt able to reach 925 mL.

## 2018-08-09 NOTE — Consult Note (Addendum)
Cardiology Consult    Patient ID: Anna Rowe MRN: [id number], DOB/AGE: May 24, 1976   Admit date: 08/08/2018 Date of Consult: 08/09/2018  Primary Physician: Kathleen Primary Cardiologist: No primary care provider on file. Requesting Provider: Hosie Poisson, MD  Patient Profile    Anna Rowe is a 42 y.o. 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 Karleen Hampshire.  History of Present Illness    Patient is a 42 year old female with the above history. Patient presented to [facility] ED on 07/22/2018 for evaluation of worsening peripheral edema. She contributed this to having a CT scan with contrast the week prior. She was prescribed Lasix by her PCP but did not notice any improvement. She also reported chronic dyspnea at that time which is why she had the recent CT scan. Patient refused to have an EKG or chest x-ray. Troponin was elevated at 0.08 and BNP was elevated at 1,149.2. Patient was treated with Lasix in the ED. She was informed she was in acute heart failure. MD recommended admission for further testing/managment but patient refused and left AMA. Home Lasix was increased to 38m twice daily at that time.  [facility] evaluation of worsening leg swelling over the last several weeks but especially over the last 3-4 days.  Patient reports it all started when she developed a cough and shortness of breath in October. She saw her PCP multiple times for this who recommended over-the-counter medications such as Mucinex. PCP ended up referring the patient to a Pulmonologist who eventually diagnosed her with COPD. The Pulmonologist recommended patient have a CT scan which she had done on 07/18/2018. Since the CT scan, patient has  reported worsening leg swelling and decreased urination. The lower extremity swelling significantly worsened the last several days. The swelling in her right leg is worse than that in her left to the point where it hurt to walk. She has also noticed some swelling in her abdomen. Patient reports some recent PND and states she has never been able to lay flat. She denies any chest pain, lightheadedness, dizziness, or abnormal bruising/bleeding. She has noticed some palpitations over the last 1.5 months. She has never seen a Cardiologist but [facility]   Upon arrival to the ED, patient tachypneic and tachycardic. EKG showed sinus tachycardia, rate 102 bpm, with LVH but no acute ischemic changes. Chest x-ray showed vascular congestion and cardiomegaly but lungs grossly clear. BNP elevated at 810.5. WBC 8.9, Hgb 13.9, Plts 397. Na 138, K 4.2, Glucose 117, SCr 1.23. Patient was admitted for acute CHF and was started on IV Lasix.   Currently, patient notes some improvement with IV diuresis.   Patient has a 20 year smoking history and currently smokes 1 pack per day. She does have a family history of heart disease on her dad side with her father having a CHF and CAD.  Past Medical History   Past Medical History:  Diagnosis Date  . Arthritis   . CHF (congestive heart failure) ([facility]   . COPD (chronic obstructive pulmonary disease) ([facility]   . Diabetes mellitus ([facility]    type 2   . DVT (deep venous thrombosis) ([facility] [place] 10[place]   RLE   .  HLD (hyperlipidemia)    on crestor  . Pneumonia    09/2016  . Sleep apnea    per patient ;been dx does not use CPAP does not tolerate    Past Surgical History:  Procedure Laterality Date  . ACHILLES TENDON REPAIR    . HERNIA REPAIR     x2; hiatal and umbilical   . IVC FILTER INSERTION N/A 04/25/2017   Procedure: IVC Filter Insertion;  Surgeon: Serafina Mitchell, MD;  Location: [facility];  Service:  Cardiovascular;  Laterality: N/A;  . IVC FILTER REMOVAL N/A 10/24/2017   Procedure: IVC FILTER REMOVAL;  Surgeon: Serafina Mitchell, MD;  Location: [facility];  Service: Cardiovascular;  Laterality: N/A;  . JOINT REPLACEMENT     right hip 07/14/17 Dr. Ninfa Linden  . KNEE ARTHROSCOPY Right 2015  . plantar    . TOTAL HIP ARTHROPLASTY Left 04/28/2017   Procedure: LEFT TOTAL HIP ARTHROPLASTY ANTERIOR APPROACH;  Surgeon: Mcarthur Rossetti, MD;  Location: [facility];  Service: Orthopedics;  Laterality: Left;  . TOTAL HIP ARTHROPLASTY Right 07/14/2017   Procedure: RIGHT TOTAL HIP ARTHROPLASTY ANTERIOR APPROACH;  Surgeon: Mcarthur Rossetti, MD;  Location: [facility];  Service: Orthopedics;  Laterality: Right;  . TOTAL KNEE ARTHROPLASTY Right 04/04/2016   Procedure: TOTAL KNEE ARTHROPLASTY;  Surgeon: Dorna Leitz, MD;  Location: [facility];  Service: Orthopedics;  Laterality: Right;     Allergies  Allergies  Allergen Reactions  . Methocarbamol Anaphylaxis and Swelling  . Tramadol Anaphylaxis    Tolerates Dilaudid, Oxycontin 04/04/16  . Bee Venom Hives    Inpatient Medications    . amitriptyline  50 mg Oral QHS  . cyclobenzaprine  10 mg Oral TID  . docusate sodium  100 mg Oral BID  . furosemide  40 mg Intravenous BID  . gabapentin  600 mg Oral QID  . insulin aspart  0-15 Units Subcutaneous TID WC  . ipratropium-albuterol  3 mL Nebulization Q6H  . mometasone-formoterol  2 puff Inhalation BID  . multivitamin with minerals  1 tablet Oral Daily  . oxyCODONE  10 mg Oral Q12H  . rivaroxaban  20 mg Oral QAC supper  . rosuvastatin  10 mg Oral QHS  . sodium chloride flush  3 mL Intravenous Q12H  . varenicline  1 mg Oral BID WC    Family History    Family History  Problem Relation Age of Onset  . Hypertension Mother   . Hypertension Father   . Hypertension Sister    She indicated that the status of her mother is unknown. She indicated that the status of her father is unknown. She  indicated that the status of her sister is unknown.   Social History    Social History   Socioeconomic History  . Marital status: Married    Spouse name: Not on file  . Number of children: Not on file  . Years of education: Not on file  . Highest education level: Not on file  Occupational History  . Not on file  Social Needs  . Financial resource strain: Not on file  . Food insecurity:    Worry: Not on file    Inability: Not on file  . Transportation needs:    Medical: Not on file    Non-medical: Not on file  Tobacco Use  . Smoking status: Current Every Day Smoker    Packs/day: 0.50    Years: 17.00    Pack years: 8.50  Types: Cigarettes  . Smokeless tobacco: Never Used  . Tobacco comment: 3 Loose cigarettes in last week  Substance and Sexual Activity  . Alcohol use: No    Comment: seldom   . Drug use: No  . Sexual activity: Not Currently  Lifestyle  . Physical activity:    Days per week: Not on file    Minutes per session: Not on file  . Stress: Not on file  Relationships  . Social connections:    Talks on phone: Not on file    Gets together: Not on file    Attends religious service: Not on file    Active member of club or organization: Not on file    Attends meetings of clubs or organizations: Not on file    Relationship status: Not on file  . Intimate partner violence:    Fear of current or ex partner: Not on file    Emotionally abused: Not on file    Physically abused: Not on file    Forced sexual activity: Not on file  Other Topics Concern  . Not on file  Social History Narrative  . Not on file     Review of Systems    Review of Systems  Constitutional: Negative for chills and fever.  HENT: Negative for congestion and sore throat.   Respiratory: Positive for cough, sputum production and shortness of breath. Negative for hemoptysis.   Cardiovascular: Positive for palpitations, orthopnea, leg swelling and PND. Negative for chest pain.    Gastrointestinal: Negative for blood in stool, nausea and vomiting.  Genitourinary: Negative for hematuria. Frequency: decrease.  Musculoskeletal: Negative for myalgias.       Chronic right left pain  Neurological: Negative for dizziness.  Endo/Heme/Allergies: Does not bruise/bleed easily.  Psychiatric/Behavioral: Positive for substance abuse (tobacco use).    Physical Exam    Blood pressure (!) 129/53, pulse 100, temperature (!) 97.3 F (36.3 C), temperature source Oral, resp. rate 18, height 6' (1.829 m), weight (!) 144.6 kg, SpO2 97 %.  General: 42 y.o.  obese African-American female resting comfortably in no acute distress. Pleasant and cooperative. HEENT: Normal  Neck: Supple. No carotid bruits. Difficult to assess JVD due to body habitus. Lungs: No increased work of breathing. Decreased breath sounds with mild crackles noted in bilateral bases and scattered wheezes. Heart: Mildly tachycardic with regular rhythm. Distinct S1 and S2. No significant murmurs, gallops, or rubs.  Abdomen: Soft, obese, and non-tender to palpation. Bowel sounds present. Extremities: 2-3+ pitting edema in bilateral lower extremities. Radial pulses 2+ and equal bilaterally. Neuro: Alert and oriented x3. No focal deficits. Moves all extremities spontaneously. Psych: Normal affect.  Labs    Troponin (Point of Care Test) No results for input(s): TROPIPOC in the last 72 hours. No results for input(s): CKTOTAL, CKMB, TROPONINI in the last 72 hours. Lab Results  Component Value Date   WBC 8.9 08/08/2018   HGB 13.9 08/08/2018   HCT 45.9 08/08/2018   MCV 70.5 (L) 08/08/2018   PLT 397 08/08/2018    Recent Labs  Lab 08/09/18 0354  NA 139  K 4.0  CL 101  CO2 24  BUN 16  CREATININE 1.11*  CALCIUM 8.9  GLUCOSE 115*   Lab Results  Component Value Date   CHOL 100 08/09/2018   HDL 20 (L) 08/09/2018   LDLCALC 61 08/09/2018   TRIG 94 08/09/2018   No results found for: [facility]   Radiology Studies     Dg Chest 2 View  Result Date: 08/08/2018 CLINICAL DATA:  Acute onset of shortness of breath, cough and bilateral lower extremity fluid retention. EXAM: CHEST - 2 VIEW COMPARISON:  Chest radiograph performed 10/10/2016 FINDINGS: The lungs are well-aerated. Vascular congestion is noted. There is no evidence of focal opacification, pleural effusion or pneumothorax. The heart is enlarged.  No acute osseous abnormalities are seen. IMPRESSION: Vascular congestion and cardiomegaly. Lungs remain grossly clear. Electronically Signed   By: Garald Balding M.D.   On: 08/08/2018 01:30   Vas [place] Lower Extremity Venous (dvt)  Result Date: 08/08/2018  Lower Venous Study Indications: History of DVT, and Swelling.  Performing Technologist: Maudry Mayhew MHA, RDMS, RVT, RDCS  Examination Guidelines: A complete evaluation includes B-mode imaging, spectral Doppler, color Doppler, and power Doppler as needed of all accessible portions of each vessel. Bilateral testing is considered an integral part of a complete examination. Limited examinations for reoccurring indications may be performed as noted.  Right Venous Findings: +---------+---------------+---------+-----------+----------+--------------+          CompressibilityPhasicitySpontaneityPropertiesSummary        +---------+---------------+---------+-----------+----------+--------------+ CFV      Full           Yes      No                                  +---------+---------------+---------+-----------+----------+--------------+ SFJ      Full                                                        +---------+---------------+---------+-----------+----------+--------------+ FV Prox  Full                                                        +---------+---------------+---------+-----------+----------+--------------+ FV Mid   Full                                                         +---------+---------------+---------+-----------+----------+--------------+ FV DistalFull                                                        +---------+---------------+---------+-----------+----------+--------------+ PFV      Full                                                        +---------+---------------+---------+-----------+----------+--------------+ POP      Full           Yes      Yes                                 +---------+---------------+---------+-----------+----------+--------------+  PTV                                                   Not visualized +---------+---------------+---------+-----------+----------+--------------+ PERO                                                  Not visualized +---------+---------------+---------+-----------+----------+--------------+  Left Venous Findings: +---------+---------------+---------+-----------+----------+--------------+          CompressibilityPhasicitySpontaneityPropertiesSummary        +---------+---------------+---------+-----------+----------+--------------+ CFV      Full           Yes      Yes                                 +---------+---------------+---------+-----------+----------+--------------+ SFJ      Full                                                        +---------+---------------+---------+-----------+----------+--------------+ FV Prox  Full                                                        +---------+---------------+---------+-----------+----------+--------------+ FV Mid   Full                                                        +---------+---------------+---------+-----------+----------+--------------+ FV DistalFull                                                        +---------+---------------+---------+-----------+----------+--------------+ PFV      Full                                                         +---------+---------------+---------+-----------+----------+--------------+ POP      Full           Yes      Yes                                 +---------+---------------+---------+-----------+----------+--------------+ PTV      Full                                                        +---------+---------------+---------+-----------+----------+--------------+  PERO                                                  Not visualized +---------+---------------+---------+-----------+----------+--------------+    Summary: Right: There is no evidence of deep vein thrombosis in the lower extremity. However, portions of this examination were limited- see technologist comments above. No cystic structure found in the popliteal fossa. Left: There is no evidence of deep vein thrombosis in the lower extremity. However, portions of this examination were limited- see technologist comments above. No cystic structure found in the popliteal fossa.  *See table(s) above for measurements and observations.    Preliminary     EKG     EKG: EKG was personally reviewed and demonstrates: Sinus tachycardia, rate 102 bpm, with LVH but no acute ischemic changes.  Telemetry: Telemetry was personally reviewed and demonstrates: Sinus tachycardia with heart rate mostly in the low 100's and frequent PVC. Short run of non-sustained VT up to 5 beats was also noted.  Cardiac Imaging   Echocardiogram 08/08/2018: Study Conclusions: - Left ventricle: The cavity size was mildly dilated. The estimated   ejection fraction was in the range of 15% to 20%. Severe diffuse   hypokinesis and septal dyskinesis. Features are consistent with a   pseudonormal left ventricular filling pattern, with concomitant   abnormal relaxation and increased filling pressure (grade 2   diastolic dysfunction). Doppler parameters are consistent with   elevated ventricular end-diastolic filling pressure. - Ventricular septum: Septal motion  showed paradox. - Aortic valve: Valve area (VTI): 1.65 cm^2. Valve area (Vmax):   1.77 cm^2. Valve area (Vmean): 1.73 cm^2. - Mitral valve: There was moderate regurgitation. - Left atrium: The atrium was moderately dilated. - Right ventricle: The cavity size was moderately dilated. Wall   thickness was normal. Systolic function was moderately reduced. - Pulmonary arteries: Systolic pressure was mildly increased. PA   peak pressure: 35 mm Hg (S).  Impressions: - No prior study available for comparison and poor image quality   sec to patient&'s size.   However, there is severe LV systolic dysfunction with severe   global hypokinesis and septal dyssynchrony, LVEF 15-20%. Apical   segments are foreshortened, additional images with Definity echo   contrast should be performed to rule out possible thrombus/?   hypertrabeculations.   RV is poorly visualized but systolic function appears to be at   least moderately decreased.  Assessment & Plan    Acute Systolic and Diastolic CHF - Patient presented with worsening lower extremity edema over the last several weeks. - BNP elevated at 810.5.  - Echo showed LVEF of 15-20% with severe diffuse hypokinesis and septal dyskinesis, grade 2 diastolic dysfunction, and mildly elevated PASP of 35 mmHg. Patient states she had an Echo at her PCP in 05/2018 and reports it was normal at that time. - Patient diuresing well. Documented output of 3.5 L in the past 24 hours with net negative of 6.4 LL since admission.  - Serum creatinine was elevated at 1.23 on admission (baseline appears to be around 0.70 to 0.90) but has improved some with diuresis. - Continue IV Lasix 69m twice daily. - Will add Coreg 3.125 twice daily.  - Will add Losartan 234mdaily. Patient would be a good candidate for EnDelene Loll consider transitioning prior to discharge. - Patient will likely need ischemic evaluation at some  point given newly reduced EF. Discussed with MD - will proceed  with left heart catheterization tomorrow. Will get repeat limited Echo with definitiy to assess for ventricular thrombus prior to cath.  Sinus Tachycardia - Telemetry shows sinus tachycardia with heart rate mostly in the low 100's with frequent PVC. Also noted brief run of non-sustained VT of 5 beats.  - Will start Coreg 3.162m twice daily.  Chronic DVT  - Patient has a history of chronic DVT initially diagnosed in 2018. She is on anticoagulation with Xarelto. - Patient presented with worsening lower extremity edema (right > left) - Lower extremity dopplers showed no evidence of DVT.  Signed, CDarreld Mclean PA-C 08/09/2018, 1:00 PM  For questions or updates, please contact   Please consult [email or web address] for contact info under Cardiology/STEMI.  Attending Note:   The patient was seen and examined.  Agree with assessment and plan as noted above.  Changes made to the above note as needed.  Patient seen and independently examined with  CSande Rives PA .   We discussed all aspects of the encounter. I agree with the assessment and plan as stated above.  1.   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  Echocardiogram reveals markedly reduced left ventricular systolic function with an ejection fraction of 10 to 15%. He is diuresed nicely and is feeling much better. She need a heart catheterization.  We will see if she is ready for heart cath tomorrow.  Otherwise we will schedule it for Monday.  Continue Lasix.  Start carvedilol 3.125 mg twice a day and losartan 25  mg a day.  We have discussed the risks, benefits, options of heart catheterization.  She understands and agrees to proceed.  Will hold her xarelto on the day prior to her cath .    I have spent a total of 40 minutes with patient reviewing hospital  notes , telemetry, EKGs, labs and examining patient as well as establishing an  assessment and plan that was discussed with the patient. > 50% of time was spent in direct patient care.    PThayer Headings JBrooke Bonito, MD, FDevereux Treatment Network12/26/2019, 3:48 PM [address]  SMitchellPager [phone]

## 2018-08-09 NOTE — Progress Notes (Signed)
PROGRESS NOTE    Anna Rowe  HYW:[id number] DOB: 19-Jun-1976 DOA: 08/08/2018 PCP: [facility]    Brief Narrative:  Anna Jarecki Quick-Hopeis a 42 y.o.femalewith medical history significant ofDM2, prior RLE DVT on chronic Xarelto now, ? CHF though no formal diagnosis her PCP put her on Lasix a few months back for leg swelling.  Patient presents to the ED with several week history of worsening leg swelling, especially worse over past 3-4 days with orthopnea and DOE.  She was admitted for evaluation of CHF.   Assessment & Plan:   Principal Problem:   Acute diastolic CHF (congestive heart failure) (HCC) Active Problems:   Chronic bilateral low back pain   DM2 (diabetes mellitus, type 2) (HCC)   History of DVT (deep vein thrombosis)   Acute on chronic systolic and diastolic heart failure:  Abnormal echocardiogram showing severe global hypokinesis.  Started her on IV lasix 40 mg BID for diuresis and so far diuresed about 5. 7 liters since admission. Pedal edema is improving, and pt reports her breathing has improved.  She denies any chest pain at this time.  Cardiology consulted for further evaluation and recommendations.  Her BP earlier today is borderline but she remains asymptomatic.  Continue with daily weights, strict intake and output and watch renal parameters and electrolytes while on IV lasix.     Tobacco abuse: Counseled , currently on chantix.    Hyperlipidemia:  Lipid panel reviewed , currently on 10 mg of crestor.    H/o COPD:  With some wheezing heard on exam today  Added duonebs and Dulera to her regimen.     Chronic pain syndrome:  Resume home meds.    H/o of DVT; Repeat US of the extremities does not show any new DVT.  On xarelto , continue the same.    Type 2 Diabetes Mellitus: CBG'S appear to be better controlled.  CBG (last 3)  Recent Labs    08/08/18 2059 08/09/18 0734 08/09/18 1142  GLUCAP 103*  121* 169*   Resume SSI. Get hgba1c .    DVT prophylaxis: Xarelto.  Code Status: Full code.  Family Communication: multiple family members at bedside.  Disposition Plan: pending cardiology recommendations.    Consultants:   Cardiology.   Procedures: Echocardiogram showing  severe LV systolic dysfunction with severe   global hypokinesis and septal dyssynchrony, LVEF 15-20%. Apical   segments are foreshortened, additional images with Definity echo   contrast should be performed to rule out possible thrombus/?   hypertrabeculations.   RV is poorly visualized but systolic function appears to be at  least moderately decreased.  Antimicrobials: None.   Subjective: Pt reports sob on walking.  No chest pain at this time.   Objective: Vitals:   08/08/18 2023 08/09/18 0057 08/09/18 0114 08/09/18 0555  BP: 111/64 116/81  97/66  Pulse: (!) 103 (!) 103  84  Resp: 18 18  20   Temp: 98.8 F (37.1 C) 98.6 F (37 C)  97.9 F (36.6 C)  TempSrc: Oral Oral  Oral  SpO2: 93% 100%  93%  Weight:   (!) 144.6 kg   Height:        Intake/Output Summary (Last 24 hours) at 08/09/2018 1152 Last data filed at 08/09/2018 1031 Gross per 24 hour  Intake 963 ml  Output 4350 ml  Net -3387 ml   Filed Weights   08/08/18 0006 08/08/18 0642 08/09/18 0114  Weight: (!) 139.3 kg (!) 143.8 kg (!) 144.6 kg  Examination:  General exam: tachypnea on talking. Not in distress.  Respiratory system: wheezing posteriorly on expiration. Air entry fair.  Cardiovascular system: S1 & S2 heard, RRR.  Gastrointestinal system: Abdomen is nondistended, soft and nontender. No organomegaly or masses felt. Normal bowel sounds heard. Central nervous system: Alert and oriented. No focal neurological deficits. Extremities: 2+ leg edema, no cyanosis. No open wounds.  Skin: No rashes, lesions or ulcers Psychiatry:Mood & affect appropriate.     Data Reviewed: I have personally reviewed following labs and imaging  studies  CBC: Recent Labs  Lab 08/08/18 0057  WBC 8.9  NEUTROABS 3.9  HGB 13.9  HCT 45.9  MCV 70.5*  PLT 562   Basic Metabolic Panel: Recent Labs  Lab 08/08/18 0057 08/08/18 0749 08/09/18 0354  NA 138  --  139  K 4.2  --  4.0  CL 101  --  101  CO2 26  --  24  GLUCOSE 117*  --  115*  BUN 17  --  16  CREATININE 1.23* 1.01* 1.11*  CALCIUM 9.5  --  8.9   GFR: Estimated Creatinine Clearance: 106 mL/min (A) (by C-G formula based on SCr of 1.11 mg/dL (H)). Liver Function Tests: No results for input(s): AST, ALT, ALKPHOS, BILITOT, PROT, ALBUMIN in the last 168 hours. No results for input(s): LIPASE, AMYLASE in the last 168 hours. No results for input(s): AMMONIA in the last 168 hours. Coagulation Profile: No results for input(s): INR, PROTIME in the last 168 hours. Cardiac Enzymes: No results for input(s): CKTOTAL, CKMB, CKMBINDEX, TROPONINI in the last 168 hours. BNP (last 3 results) No results for input(s): PROBNP in the last 8760 hours. HbA1C: No results for input(s): HGBA1C in the last 72 hours. CBG: Recent Labs  Lab 08/08/18 1213 08/08/18 1637 08/08/18 2059 08/09/18 0734 08/09/18 1142  GLUCAP 107* 165* 103* 121* 169*   Lipid Profile: Recent Labs    08/09/18 0354  CHOL 100  HDL 20*  LDLCALC 61  TRIG 94  CHOLHDL 5.0   Thyroid Function Tests: No results for input(s): TSH, T4TOTAL, FREET4, T3FREE, THYROIDAB in the last 72 hours. Anemia Panel: No results for input(s): VITAMINB12, FOLATE, FERRITIN, TIBC, IRON, RETICCTPCT in the last 72 hours. Sepsis Labs: No results for input(s): PROCALCITON, LATICACIDVEN in the last 168 hours.  No results found for this or any previous visit (from the past 240 hour(s)).       Radiology Studies: Dg Chest 2 View  Result Date: 08/08/2018 CLINICAL DATA:  Acute onset of shortness of breath, cough and bilateral lower extremity fluid retention. EXAM: CHEST - 2 VIEW COMPARISON:  Chest radiograph performed 10/10/2016  FINDINGS: The lungs are well-aerated. Vascular congestion is noted. There is no evidence of focal opacification, pleural effusion or pneumothorax. The heart is enlarged.  No acute osseous abnormalities are seen. IMPRESSION: Vascular congestion and cardiomegaly. Lungs remain grossly clear. Electronically Signed   By: Garald Balding M.D.   On: 08/08/2018 01:30   Vas [place] Lower Extremity Venous (dvt)  Result Date: 08/08/2018  Lower Venous Study Indications: History of DVT, and Swelling.  Performing Technologist: Maudry Mayhew MHA, RDMS, RVT, RDCS  Examination Guidelines: A complete evaluation includes B-mode imaging, spectral Doppler, color Doppler, and power Doppler as needed of all accessible portions of each vessel. Bilateral testing is considered an integral part of a complete examination. Limited examinations for reoccurring indications may be performed as noted.  Right Venous Findings: +---------+---------------+---------+-----------+----------+--------------+          CompressibilityPhasicitySpontaneityPropertiesSummary        +---------+---------------+---------+-----------+----------+--------------+  CFV      Full           Yes      No                                  +---------+---------------+---------+-----------+----------+--------------+ SFJ      Full                                                        +---------+---------------+---------+-----------+----------+--------------+ FV Prox  Full                                                        +---------+---------------+---------+-----------+----------+--------------+ FV Mid   Full                                                        +---------+---------------+---------+-----------+----------+--------------+ FV DistalFull                                                        +---------+---------------+---------+-----------+----------+--------------+ PFV      Full                                                         +---------+---------------+---------+-----------+----------+--------------+ POP      Full           Yes      Yes                                 +---------+---------------+---------+-----------+----------+--------------+ PTV                                                   Not visualized +---------+---------------+---------+-----------+----------+--------------+ PERO                                                  Not visualized +---------+---------------+---------+-----------+----------+--------------+  Left Venous Findings: +---------+---------------+---------+-----------+----------+--------------+          CompressibilityPhasicitySpontaneityPropertiesSummary        +---------+---------------+---------+-----------+----------+--------------+ CFV      Full           Yes      Yes                                 +---------+---------------+---------+-----------+----------+--------------+  SFJ      Full                                                        +---------+---------------+---------+-----------+----------+--------------+ FV Prox  Full                                                        +---------+---------------+---------+-----------+----------+--------------+ FV Mid   Full                                                        +---------+---------------+---------+-----------+----------+--------------+ FV DistalFull                                                        +---------+---------------+---------+-----------+----------+--------------+ PFV      Full                                                        +---------+---------------+---------+-----------+----------+--------------+ POP      Full           Yes      Yes                                 +---------+---------------+---------+-----------+----------+--------------+ PTV      Full                                                         +---------+---------------+---------+-----------+----------+--------------+ PERO                                                  Not visualized +---------+---------------+---------+-----------+----------+--------------+    Summary: Right: There is no evidence of deep vein thrombosis in the lower extremity. However, portions of this examination were limited- see technologist comments above. No cystic structure found in the popliteal fossa. Left: There is no evidence of deep vein thrombosis in the lower extremity. However, portions of this examination were limited- see technologist comments above. No cystic structure found in the popliteal fossa.  *See table(s) above for measurements and observations.    Preliminary         Scheduled Meds: . amitriptyline  50 mg Oral QHS  . cyclobenzaprine  10 mg Oral TID  . docusate sodium  100 mg Oral BID  . furosemide  40 mg Intravenous  BID  . gabapentin  600 mg Oral QID  . insulin aspart  0-15 Units Subcutaneous TID WC  . ipratropium-albuterol  3 mL Nebulization Q6H  . mometasone-formoterol  2 puff Inhalation BID  . multivitamin with minerals  1 tablet Oral Daily  . oxyCODONE  10 mg Oral Q12H  . rivaroxaban  20 mg Oral QAC supper  . rosuvastatin  10 mg Oral QHS  . sodium chloride flush  3 mL Intravenous Q12H  . varenicline  1 mg Oral BID WC   Continuous Infusions: . sodium chloride       LOS: 0 days    Time spent: 37 minutes.     Hosie Poisson, MD [facility] Pager [phone] If 7PM-7AM, please contact night-coverage [email or web address] Password [facility] 08/09/2018, 11:52 AM

## 2018-08-10 ENCOUNTER — Inpatient Hospital Stay: Payer: Self-pay

## 2018-08-10 ENCOUNTER — Encounter (HOSPITAL_COMMUNITY)
Admission: EM | Disposition: A | Discharge status: Home / Self Care | Payer: Self-pay | Source: Home / Self Care | Attending: Internal Medicine

## 2018-08-10 DIAGNOSIS — I5031 Acute diastolic (congestive) heart failure: Secondary | ICD-10-CM

## 2018-08-10 DIAGNOSIS — I2510 Atherosclerotic heart disease of native coronary artery without angina pectoris: Secondary | ICD-10-CM

## 2018-08-10 HISTORY — PX: RIGHT/LEFT HEART CATH AND CORONARY ANGIOGRAPHY: CATH118266

## 2018-08-10 LAB — GLUCOSE, CAPILLARY
Glucose-Capillary: 119 mg/dL — ABNORMAL HIGH (ref 70–99)
Glucose-Capillary: 130 mg/dL — ABNORMAL HIGH (ref 70–99)
Glucose-Capillary: 122 mg/dL — ABNORMAL HIGH (ref 70–99)
Glucose-Capillary: 125 mg/dL — ABNORMAL HIGH (ref 70–99)

## 2018-08-10 LAB — POCT I-STAT 3, VENOUS BLOOD GAS (G3P V)
Acid-Base Excess: 3 mmol/L — ABNORMAL HIGH (ref 0.0–2.0)
Acid-Base Excess: 2 mmol/L (ref 0.0–2.0)
Bicarbonate: 30.7 mmol/L — ABNORMAL HIGH (ref 20.0–28.0)
Bicarbonate: 28.6 mmol/L — ABNORMAL HIGH (ref 20.0–28.0)
O2 Saturation: 42 %
O2 Saturation: 44 %
TCO2: 32 mmol/L (ref 22–32)
TCO2: 30 mmol/L (ref 22–32)
pCO2, Ven: 56.6 mmHg (ref 44.0–60.0)
pCO2, Ven: 52.3 mmHg (ref 44.0–60.0)
pH, Ven: 7.341 (ref 7.250–7.430)
pH, Ven: 7.345 (ref 7.250–7.430)
pO2, Ven: 26 mmHg — CL (ref 32.0–45.0)
pO2, Ven: 26 mmHg — CL (ref 32.0–45.0)

## 2018-08-10 LAB — CBC
HCT: 43.3 % (ref 36.0–46.0)
Hemoglobin: 13.6 g/dL (ref 12.0–15.0)
MCH: 22 pg — ABNORMAL LOW (ref 26.0–34.0)
MCHC: 31.4 g/dL (ref 30.0–36.0)
MCV: 70.1 fL — ABNORMAL LOW (ref 80.0–100.0)
Platelets: 408 10*3/uL — ABNORMAL HIGH (ref 150–400)
RBC: 6.18 MIL/uL — ABNORMAL HIGH (ref 3.87–5.11)
RDW: 17.2 % — ABNORMAL HIGH (ref 11.5–15.5)
WBC: 7.7 10*3/uL (ref 4.0–10.5)
nRBC: 0 % (ref 0.0–0.2)

## 2018-08-10 LAB — POCT I-STAT 3, ART BLOOD GAS (G3+)
Acid-Base Excess: 2 mmol/L (ref 0.0–2.0)
Bicarbonate: 27.9 mmol/L (ref 20.0–28.0)
O2 Saturation: 89 %
TCO2: 29 mmol/L (ref 22–32)
pCO2 arterial: 45.7 mmHg (ref 32.0–48.0)
pH, Arterial: 7.394 (ref 7.350–7.450)
pO2, Arterial: 57 mmHg — ABNORMAL LOW (ref 83.0–108.0)

## 2018-08-10 LAB — COOXEMETRY PANEL
Carboxyhemoglobin: 1.5 % (ref 0.5–1.5)
Methemoglobin: 1.4 % (ref 0.0–1.5)
O2 Saturation: 74.5 %
Total hemoglobin: 14.5 g/dL (ref 12.0–16.0)

## 2018-08-10 LAB — HEMOGLOBIN A1C
Hgb A1c MFr Bld: 7.2 % — ABNORMAL HIGH (ref 4.8–5.6)
Mean Plasma Glucose: 160 mg/dL

## 2018-08-10 LAB — BASIC METABOLIC PANEL
Anion gap: 11 (ref 5–15)
BUN: 16 mg/dL (ref 6–20)
CO2: 28 mmol/L (ref 22–32)
Calcium: 8.7 mg/dL — ABNORMAL LOW (ref 8.9–10.3)
Chloride: 100 mmol/L (ref 98–111)
Creatinine, Ser: 1.14 mg/dL — ABNORMAL HIGH (ref 0.44–1.00)
GFR calc Af Amer: >60 mL/min (ref >60)
GFR calc non Af Amer: 59 mL/min — ABNORMAL LOW (ref >60)
Glucose, Bld: 140 mg/dL — ABNORMAL HIGH (ref 70–99)
Potassium: 3.9 mmol/L (ref 3.5–5.1)
Sodium: 139 mmol/L (ref 135–145)

## 2018-08-10 LAB — PREGNANCY, URINE: Preg Test, Ur: NEGATIVE

## 2018-08-10 LAB — MAGNESIUM: Magnesium: 2.2 mg/dL (ref 1.7–2.4)

## 2018-08-10 SURGERY — RIGHT/LEFT HEART CATH AND CORONARY ANGIOGRAPHY
Anesthesia: LOCAL

## 2018-08-10 MED ORDER — DIGOXIN 125 MCG PO TABS
0.1250 mg | ORAL_TABLET | Freq: Every day | ORAL | Status: DC
  Administered 2018-08-10 – 2018-08-15 (×6): 0.125 mg via ORAL
  Filled 2018-08-10 (×6): qty 1

## 2018-08-10 MED ORDER — HEPARIN SODIUM (PORCINE) 1000 UNIT/ML IJ SOLN
INTRAMUSCULAR | Status: DC | PRN
  Administered 2018-08-10: 6000 [IU] via INTRAVENOUS

## 2018-08-10 MED ORDER — VERAPAMIL HCL 2.5 MG/ML IV SOLN
INTRAVENOUS | Status: DC | PRN
  Administered 2018-08-10: 10 mL via INTRA_ARTERIAL

## 2018-08-10 MED ORDER — ENOXAPARIN SODIUM 40 MG/0.4ML ~~LOC~~ SOLN: 40.0000 mg | SUBCUTANEOUS | Status: DC

## 2018-08-10 MED ORDER — SODIUM CHLORIDE 0.9% FLUSH: 10.0000 mL | INTRAVENOUS | Status: DC | PRN

## 2018-08-10 MED ORDER — SODIUM CHLORIDE 0.9% FLUSH
3.0000 mL | Freq: Two times a day (BID) | INTRAVENOUS | Status: DC
  Administered 2018-08-10: 3 mL via INTRAVENOUS

## 2018-08-10 MED ORDER — FUROSEMIDE 10 MG/ML IJ SOLN
80.0000 mg | Freq: Two times a day (BID) | INTRAMUSCULAR | Status: DC
  Administered 2018-08-10 – 2018-08-12 (×6): 80 mg via INTRAVENOUS
  Filled 2018-08-10 (×6): qty 8

## 2018-08-10 MED ORDER — IOHEXOL 350 MG/ML SOLN
INTRAVENOUS | Status: DC | PRN
  Administered 2018-08-10: 30 mL via INTRAVENOUS

## 2018-08-10 MED ORDER — LIDOCAINE HCL (PF) 1 % IJ SOLN
INTRAMUSCULAR | Status: AC
  Filled 2018-08-10: qty 30

## 2018-08-10 MED ORDER — ACETAMINOPHEN 325 MG PO TABS: 650.0000 mg | ORAL_TABLET | ORAL | Status: DC | PRN

## 2018-08-10 MED ORDER — FUROSEMIDE 10 MG/ML IJ SOLN
INTRAMUSCULAR | Status: AC
  Filled 2018-08-10: qty 8

## 2018-08-10 MED ORDER — SODIUM CHLORIDE 0.9 % IV SOLN: 250.0000 mL | INTRAVENOUS | Status: DC | PRN

## 2018-08-10 MED ORDER — HEPARIN (PORCINE) IN NACL 1000-0.9 UT/500ML-% IV SOLN
INTRAVENOUS | Status: AC
  Filled 2018-08-10: qty 1000

## 2018-08-10 MED ORDER — MIDAZOLAM HCL 2 MG/2ML IJ SOLN
INTRAMUSCULAR | Status: AC
  Filled 2018-08-10: qty 2

## 2018-08-10 MED ORDER — HEPARIN (PORCINE) IN NACL 1000-0.9 UT/500ML-% IV SOLN
INTRAVENOUS | Status: DC | PRN
  Administered 2018-08-10 (×2): 500 mL

## 2018-08-10 MED ORDER — MIDAZOLAM HCL 2 MG/2ML IJ SOLN
INTRAMUSCULAR | Status: DC | PRN
  Administered 2018-08-10: 1 mg via INTRAVENOUS

## 2018-08-10 MED ORDER — RIVAROXABAN 20 MG PO TABS
20.0000 mg | ORAL_TABLET | Freq: Every day | ORAL | Status: DC
  Administered 2018-08-10 – 2018-08-15 (×6): 20 mg via ORAL
  Filled 2018-08-10 (×7): qty 1

## 2018-08-10 MED ORDER — HEPARIN SODIUM (PORCINE) 1000 UNIT/ML IJ SOLN
INTRAMUSCULAR | Status: AC
  Filled 2018-08-10: qty 1

## 2018-08-10 MED ORDER — LIDOCAINE HCL (PF) 1 % IJ SOLN
INTRAMUSCULAR | Status: DC | PRN
  Administered 2018-08-10: 1 mL via INTRADERMAL
  Administered 2018-08-10: 2 mL via INTRADERMAL

## 2018-08-10 MED ORDER — ONDANSETRON HCL 4 MG/2ML IJ SOLN: 4.0000 mg | Freq: Four times a day (QID) | INTRAMUSCULAR | Status: DC | PRN

## 2018-08-10 MED ORDER — POTASSIUM CHLORIDE CRYS ER 10 MEQ PO TBCR
20.0000 meq | EXTENDED_RELEASE_TABLET | Freq: Two times a day (BID) | ORAL | Status: DC
  Administered 2018-08-10 – 2018-08-11 (×3): 20 meq via ORAL
  Filled 2018-08-10 (×4): qty 2

## 2018-08-10 MED ORDER — SPIRONOLACTONE 12.5 MG HALF TABLET
12.5000 mg | ORAL_TABLET | Freq: Every day | ORAL | Status: DC
  Administered 2018-08-10 – 2018-08-15 (×6): 12.5 mg via ORAL
  Filled 2018-08-10 (×6): qty 1

## 2018-08-10 MED ORDER — FENTANYL CITRATE (PF) 100 MCG/2ML IJ SOLN
INTRAMUSCULAR | Status: DC | PRN
  Administered 2018-08-10: 25 ug via INTRAVENOUS

## 2018-08-10 MED ORDER — FENTANYL CITRATE (PF) 100 MCG/2ML IJ SOLN
INTRAMUSCULAR | Status: AC
  Filled 2018-08-10: qty 2

## 2018-08-10 MED ORDER — VERAPAMIL HCL 2.5 MG/ML IV SOLN
INTRAVENOUS | Status: AC
  Filled 2018-08-10: qty 2

## 2018-08-10 MED ORDER — SODIUM CHLORIDE 0.9% FLUSH
10.0000 mL | Freq: Two times a day (BID) | INTRAVENOUS | Status: DC
  Administered 2018-08-10: 30 mL
  Administered 2018-08-11: 20 mL
  Administered 2018-08-11 – 2018-08-12 (×3): 10 mL
  Administered 2018-08-13: 33 mL
  Administered 2018-08-13 – 2018-08-15 (×2): 10 mL

## 2018-08-10 MED ORDER — MILRINONE LACTATE IN DEXTROSE 20-5 MG/100ML-% IV SOLN
0.1250 ug/kg/min | INTRAVENOUS | Status: DC
  Administered 2018-08-10 – 2018-08-12 (×8): 0.25 ug/kg/min via INTRAVENOUS
  Administered 2018-08-14: 0.125 ug/kg/min via INTRAVENOUS
  Filled 2018-08-10 (×11): qty 100

## 2018-08-10 MED ORDER — SODIUM CHLORIDE 0.9% FLUSH: 3.0000 mL | INTRAVENOUS | Status: DC | PRN

## 2018-08-10 MED ORDER — POTASSIUM CHLORIDE CRYS ER 20 MEQ PO TBCR
EXTENDED_RELEASE_TABLET | ORAL | Status: AC
  Filled 2018-08-10: qty 1

## 2018-08-10 SURGICAL SUPPLY — 15 items
CATH 5FR JL3.5 JR4 ANG PIG MP (CATHETERS) ×2 IMPLANT
CATH BALLN WEDGE 5F 110CM (CATHETERS) ×2 IMPLANT
DEVICE RAD COMP TR BAND LRG (VASCULAR PRODUCTS) ×2 IMPLANT
GLIDESHEATH SLEND SS 6F .021 (SHEATH) ×2 IMPLANT
GUIDEWIRE .025 260CM (WIRE) ×2 IMPLANT
GUIDEWIRE INQWIRE 1.5J.035X260 (WIRE) ×1 IMPLANT
HOVERMATT SINGLE USE (MISCELLANEOUS) ×2 IMPLANT
INQWIRE 1.5J .035X260CM (WIRE) ×2
KIT HEART LEFT (KITS) ×2 IMPLANT
KIT MICROPUNCTURE NIT STIFF (SHEATH) ×2 IMPLANT
PACK CARDIAC CATHETERIZATION (CUSTOM PROCEDURE TRAY) ×2 IMPLANT
SHEATH GLIDE SLENDER 4/5FR (SHEATH) ×2 IMPLANT
SHEATH PROBE COVER 6X72 (BAG) ×2 IMPLANT
TRANSDUCER W/STOPCOCK (MISCELLANEOUS) ×2 IMPLANT
TUBING CIL FLEX 10 FLL-RA (TUBING) ×2 IMPLANT

## 2018-08-10 NOTE — Progress Notes (Signed)
Orthopedic Tech Progress Note Patient Details:  Anna Rowe 05/10/1976 [id number]  Ortho Devices Type of Ortho Device: Ace wrap, Unna boot Ortho Device/Splint Location: LLE & RLE Ortho Device/Splint Interventions: Application, Adjustment   Post Interventions Patient Tolerated: Well Instructions Provided: Care of device   Ola Raap J Mileigh Tilley 08/10/2018, 6:23 PM

## 2018-08-10 NOTE — Progress Notes (Signed)
TR BAND REMOVAL  LOCATION: right    radial  DEFLATED PER PROTOCOL:     TIME BAND OFF / DRESSING APPLIED:    1400  SITE UPON ARRIVAL:    Level 0  SITE AFTER BAND REMOVAL:    Level 0  CIRCULATION SENSATION AND MOVEMENT:    Within Normal Limits :yes  COMMENTS:

## 2018-08-10 NOTE — Interval H&P Note (Signed)
History and Physical Interval Note:  08/10/2018 7:22 AM  Anna Rowe  has presented today for surgery, with the diagnosis of reduced ejection fracture  The various methods of treatment have been discussed with the patient and family. After consideration of risks, benefits and other options for treatment, the patient has consented to  Procedure(s): RIGHT/LEFT HEART CATH AND CORONARY ANGIOGRAPHY (N/A) and possible coronary angioplasty as a surgical intervention .  The patient's history has been reviewed, patient examined, no change in status, stable for surgery.  I have reviewed the patient's chart and labs.  Questions were answered to the patient's satisfaction.     Daniel Bensimhon

## 2018-08-10 NOTE — Plan of Care (Signed)
  Problem: Education: Goal: Knowledge of General Education information will improve Description Including pain rating scale, medication(s)/side effects and non-pharmacologic comfort measures Outcome: Progressing   Problem: Health Behavior/Discharge Planning: Goal: Ability to manage health-related needs will improve Outcome: Progressing   

## 2018-08-10 NOTE — Progress Notes (Signed)
Peripherally Inserted Central Catheter/Midline Placement  The IV Nurse has discussed with the patient and/or persons authorized to consent for the patient, the purpose of this procedure and the potential benefits and risks involved with this procedure.  The benefits include less needle sticks, lab draws from the catheter, and the patient may be discharged home with the catheter. Risks include, but not limited to, infection, bleeding, blood clot (thrombus formation), and puncture of an artery; nerve damage and irregular heartbeat and possibility to perform a PICC exchange if needed/ordered by physician.  Alternatives to this procedure were also discussed.  Bard Power PICC patient education guide, fact sheet on infection prevention and patient information card has been provided to patient /or left at bedside.    PICC/Midline Placement Documentation  PICC Triple Lumen 08/10/18 PICC Left Brachial 45 cm 1 cm (Active)  Indication for Insertion or Continuance of Line Chronic illness with exacerbations (CF, Sickle Cell, etc.) 08/10/2018 10:40 PM  Exposed Catheter (cm) 1 cm 08/10/2018 10:40 PM  Site Assessment Clean;Dry;Intact 08/10/2018 10:40 PM  Lumen #1 Status Flushed;Saline locked;Blood return noted 08/10/2018 10:40 PM  Lumen #2 Status Flushed;Saline locked;Blood return noted 08/10/2018 10:40 PM  Lumen #3 Status Flushed;Saline locked;Blood return noted 08/10/2018 10:40 PM  Dressing Type Transparent 08/10/2018 10:40 PM  Dressing Status Clean;Dry;Intact 08/10/2018 10:40 PM  Dressing Intervention New dressing 08/10/2018 10:40 PM  Dressing Change Due 08/17/18 08/10/2018 10:40 PM       Anna Rowe, Nicolette Bang 08/10/2018, 10:42 PM

## 2018-08-10 NOTE — Progress Notes (Addendum)
PROGRESS NOTE  Clifton Kovacic PPJ:[id number] DOB: 04-25-1976 DOA: 08/08/2018 [facility]: [facility]  HPI/Recap of past 24 hours: [name] a 42 y.o.femalewith medical history significant ofDM2, prior RLE DVT on chronic Xarelto now, ? CHF though no formal diagnosis her PCP put her on Lasix a few months back for leg swelling.  Patient presents to the ED with several week history of worsening leg swelling, especially worse over past 3-4 dayswithorthopnea and DOE. She was admitted for evaluation of CHF.   08/10/2018: Patient seen and examined at bedside post left and right cath.  Denies any chest pain or dyspnea at rest.  Assessment/Plan: Principal Problem:   Acute diastolic CHF (congestive heart failure) (HCC) Active Problems:   Chronic bilateral low back pain   DM2 (diabetes mellitus, type 2) (HCC)   History of DVT (deep vein thrombosis)   CHF (congestive heart failure) (HCC)  Acute on chronic systolic and diastolic heart failure:  Abnormal echocardiogram showing severe global hypokinesis with LVEF 15 to 20% on 08/08/2018.  Continue IV lasix 40 mg BID for diuresis and so far diuresed about 5. 7 liters since admission. Pedal edema is improving, and pt reports her breathing has improved.  Cardiology consulted for further evaluation and recommendations.  Continue with daily weights, strict intake and output and watch renal parameters and electrolytes while on IV lasix.  Post left and right heart cath Recommendations per cardiology Independently reviewed chest x-ray done during this admission which revealed cardiomegaly with increase in pulmonary vascularity suggestive of pulmonary edema Maintain O2 saturation greater than 92%  Sinus tachycardia Cardiac medications per cardiology  Tobacco abuse: Counseled , currently on chantix.   Hyperlipidemia:  Lipid panel reviewed , currently on 10 mg of crestor.   H/o COPD:  Continue duo  nebs and Dulera  Chronic pain syndrome:  Resume her home meds.  H/o of DVT; Repeat US of the extremities does not show any new DVT.  On xarelto , continue the same.    Type 2 Diabetes Mellitus: CBG'S appear to be better controlled.  CBG (last 3)  RecentLabs(last2labs)  Recent Labs    08/08/18 2059 08/09/18 0734 08/09/18 1142  GLUCAP 103* 121* 169*     Resume SSI. Get hgba1c .    DVT prophylaxis: Xarelto.  Code Status: Full code.  Family Communication: multiple family members at bedside.  Disposition Plan: pending cardiology recommendations.     Objective: Vitals:   08/10/18 1415 08/10/18 1420 08/10/18 1425 08/10/18 1520  BP:   136/65 (!) 139/56  Pulse: (!) 105 (!) 105 (!) 103 (!) 104  Resp: (!) 28 (!) 28 (!) 28 (!) 22  Temp:    98 F (36.7 C)  TempSrc:    Oral  SpO2: 96% 96% 96% 99%  Weight:      Height:        Intake/Output Summary (Last 24 hours) at 08/10/2018 1832 Last data filed at 08/10/2018 1411 Gross per 24 hour  Intake 820 ml  Output 3450 ml  Net -2630 ml   Filed Weights   08/08/18 0642 08/09/18 0114 08/10/18 0453  Weight: (!) 143.8 kg (!) 144.6 kg (!) 141.7 kg    Exam:  . General: 42 y.o. year-old female well developed well nourished in no acute distress.  Alert and oriented x3. . Cardiovascular: Regular rate and rhythm with no rubs or gallops.  No thyromegaly or JVD noted.   Marland Kitchen Respiratory: Mild rales at bases with no wheezes. Good inspiratory effort. Marland Kitchen  Abdomen: Soft nontender nondistended with normal bowel sounds x4 quadrants. . Musculoskeletal: 1+ pitting edema lower extremities bilaterally. 2/4 pulses in all 4 extremities. Marland Kitchen Psychiatry: Mood is appropriate for condition and setting   Data Reviewed: CBC: Recent Labs  Lab 08/08/18 0057 08/10/18 0552  WBC 8.9 7.7  NEUTROABS 3.9  --   HGB 13.9 13.6  HCT 45.9 43.3  MCV 70.5* 70.1*  PLT 397 235*   Basic Metabolic Panel: Recent Labs  Lab 08/08/18 0057 08/08/18 0749  08/09/18 0354 08/10/18 0552  NA 138  --  139 139  K 4.2  --  4.0 3.9  CL 101  --  101 100  CO2 26  --  24 28  GLUCOSE 117*  --  115* 140*  BUN 17  --  16 16  CREATININE 1.23* 1.01* 1.11* 1.14*  CALCIUM 9.5  --  8.9 8.7*   GFR: Estimated Creatinine Clearance: 102 mL/min (A) (by C-G formula based on SCr of 1.14 mg/dL (H)). Liver Function Tests: No results for input(s): AST, ALT, ALKPHOS, BILITOT, PROT, ALBUMIN in the last 168 hours. No results for input(s): LIPASE, AMYLASE in the last 168 hours. No results for input(s): AMMONIA in the last 168 hours. Coagulation Profile: No results for input(s): INR, PROTIME in the last 168 hours. Cardiac Enzymes: No results for input(s): CKTOTAL, CKMB, CKMBINDEX, TROPONINI in the last 168 hours. BNP (last 3 results) No results for input(s): PROBNP in the last 8760 hours. HbA1C: Recent Labs    08/09/18 0354  HGBA1C 7.2*   CBG: Recent Labs  Lab 08/09/18 1142 08/09/18 1638 08/09/18 2140 08/10/18 0850 08/10/18 1625  GLUCAP 169* 125* 119* 130* 122*   Lipid Profile: Recent Labs    08/09/18 0354  CHOL 100  HDL 20*  LDLCALC 61  TRIG 94  CHOLHDL 5.0   Thyroid Function Tests: No results for input(s): TSH, T4TOTAL, FREET4, T3FREE, THYROIDAB in the last 72 hours. Anemia Panel: No results for input(s): VITAMINB12, FOLATE, FERRITIN, TIBC, IRON, RETICCTPCT in the last 72 hours. Urine analysis:    Component Value Date/Time   COLORURINE YELLOW 07/22/2018 1729   APPEARANCEUR CLEAR 07/22/2018 1729   LABSPEC 1.015 07/22/2018 1729   PHURINE 6.0 07/22/2018 1729   GLUCOSEU NEGATIVE 07/22/2018 1729   HGBUR MODERATE (A) 07/22/2018 1729   BILIRUBINUR NEGATIVE 07/22/2018 1729   KETONESUR NEGATIVE 07/22/2018 1729   PROTEINUR NEGATIVE 07/22/2018 1729   UROBILINOGEN 1.0 12/13/2012 0230   NITRITE NEGATIVE 07/22/2018 1729   LEUKOCYTESUR TRACE (A) 07/22/2018 1729   Sepsis Labs: @LABRCNTIP (procalcitonin:4,lacticidven:4)  )No results found for  this or any previous visit (from the past 240 hour(s)).    Studies: [place] Ekg Site Rite  Result Date: 08/10/2018 If [facility] image not attached, placement could not be confirmed due to current cardiac rhythm.   Scheduled Meds: . amitriptyline  50 mg Oral QHS  . cyclobenzaprine  10 mg Oral TID  . digoxin  0.125 mg Oral Daily  . docusate sodium  100 mg Oral BID  . furosemide  80 mg Intravenous BID  . gabapentin  600 mg Oral QID  . insulin aspart  0-15 Units Subcutaneous TID WC  . ipratropium-albuterol  3 mL Nebulization TID  . losartan  25 mg Oral Daily  . mometasone-formoterol  2 puff Inhalation BID  . multivitamin with minerals  1 tablet Oral Daily  . oxyCODONE  10 mg Oral Q12H  . potassium chloride  20 mEq Oral BID  . rivaroxaban  20 mg Oral  Q supper  . rosuvastatin  10 mg Oral QHS  . sodium chloride flush  3 mL Intravenous Q12H  . sodium chloride flush  3 mL Intravenous Q12H  . spironolactone  12.5 mg Oral Daily  . varenicline  1 mg Oral BID WC    Continuous Infusions: . sodium chloride    . sodium chloride    . milrinone 0.25 mcg/kg/min (08/10/18 1034)     LOS: 1 day     Kayleen Memos, MD [facility] Pager [phone]  If 7PM-7AM, please contact night-coverage [email or web address] Password [facility] 08/10/2018, 6:32 PM

## 2018-08-10 NOTE — Progress Notes (Signed)
Orthopedic Tech Progress Note Patient Details:  Anna Rowe September 02, 1975 [id number]  Ortho Devices Type of Ortho Device: Ace wrap, Unna boot Ortho Device/Splint Location: LLE & RLE Ortho Device/Splint Interventions: Application, Adjustment   Post Interventions Patient Tolerated: Well Instructions Provided: Care of device   Gordie Belvin J Brinley Treanor 08/10/2018, 6:30 PM

## 2018-08-10 NOTE — Consult Note (Addendum)
Advanced Heart Failure Team Consult Note   Primary Physician: [facility] [place] PCP-Cardiologist:  No primary care provider on file.  Pulmonary: Anna Rowe  Reason for Consultation: Cardiogenic shock  HPI:    Anna Rowe is seen today for evaluation of cardiogenic shock at the request of Anna Rowe.  Anna Rowe is a 43 y.o. female with a history of hyperlipidemia, DM, COPD, sleep apnea noncompliant with CPAP, chronic DVT (diagnosed 2018) on Xarelto, tobacco use, right knee replacement 2015, and COPD.  She is followed closely by her PCP, Anna Rowe. She had an abnormal EKG a few years ago and gets an echo every year. Most recent echo was in Sept or Oct and patient tells me it was normal other than mild heart enlargement. She does not have a cardiologist.   She was doing well up until October, when she developed a productive cough with white sputum. She saw PCP multiple times in Oct and November. She was given mucinex with no improvement. Denies any fever or chills. Mid-November, she started experiencing PND and orthopnea. Cough had improved slightly. She started to become SOB and fatigued with walking short distances. Her PCP saw her mid-November and O2 sats were in low 80s. She was given a breathing treatment and set up to see pulmonary the next day. She had PFTs, which showed COPD. She was set up for High Res Chest CT on 12/4. She drank a lot of water following the CT scan and had little UOP.   On 12/6, she had worsening LE edema and was unable to urinate. Her PCP started her on lasix 20 mg daily. She had worsening LE edema over the weekend so she went to [place] on 12/8. She refused EKG or CXR. Troponin was 0.08, BNP 1149. She was given IV lasix with improvement in UOP. She was offered admission and transfer to [facility], but did not want to pay ambulance fee so she left AMA. Home lasix was increased to 40 mg BID.   She saw her pulmonologist, Anna  Anna Rowe on 12/11 and was started on Symbicort and referred for repeat sleep study.   She did okay with the increased dose of lasix. Legs did not swell more, but did not improve. She was still SOB and was wearing CPAP during the day (was previously noncompliant with CPAP qHS).   On 12/24, she woke up and RLE was much more swollen than LLE, so she presented to [facility] for further evaluation on 12/25. She had gained 35 lbs over the last couple months. Echo showed newly reduced EF 15-20%. BLE dopplers negative for DVT. Cardiology was consulted. She was started on IV lasix, coreg, and losartan. Underwent R/LHC today, which showed marked volume overload with low output, severe NICM with EF 15%, and minimal non-obstructive CAD. Advanced HF team consulted. Milrinone ordered and IV lasix increased. Beta blocker held.   Pertinent admission labs: K 4.2, creatinine 1.23, BNP 810 (previously 1149), WBC 8.9, hemoglobin 13.9 CXR: vascular congestion and CM, lungs clear  She has been diuresing well with IV lasix. BLE edema is improved. Denies CP. Weight down 6 lbs from highest charted weight. Creatinine stable 1.14. SBP 100-120s.  Echo 08/08/18: EF 15-20%, severe diffuse HK and septal dyskinesis, grade 2 DD, mod MR, LA moderately dilated, RV moderately dilated with moderately reduced function, PA peak pressure 35 mm Hg  BLE [place] 08/08/18: negative for DVT, but limited exam  R/LHC 08/10/18:  Prox LAD lesion is 30%  stenosed. Findings: Ao = 99/75 (81) LV = 93/31 RA = 22 RV = 54/25 PA = 58/24 (41) PCW = 30 Fick cardiac output/index = 4.1/1.6 SVR = 1163 PVR = 2.6 FA sat = 89% PA sat = 42%, 44% Assessment: 1. 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 Severe NICM with LVEF ~ 15% 3. Marked volume overload with low output  FH: Anna Rowe with CHF and CAD.  SH: Current smoker 1/2 ppd x 20 years. No ETOH use. Lives at home with husband. She has not worked in 5-6 years. 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 No  problems with getting medications or transportation. Medicare to start in February.   Review of Systems: [y] = yes, [ ]  = no   General: Weight gain [ y]; Weight loss [ ] ; Anorexia [ ] ; Fatigue [name] ]; Fever [ ] ; Chills [ ] ; Weakness [ ]   Cardiac: Chest pain/pressure [ ] ; Resting SOB [ ] ; Exertional SOB [name] ]; Orthopnea [ ] ; Pedal Edema [name] ]; Palpitations [ ] ; Syncope [name] ]; Presyncope [ ] ; Paroxysmal nocturnal dyspnea[ ]   Pulmonary: Cough [ y]; Wheezing[ ] ; Hemoptysis[ ] ; Sputum [ ] ; Snoring [ ]   GI: Vomiting[ ] ; Dysphagia[ ] ; Melena[ ] ; Hematochezia [ ] ; Heartburn[ ] ; Abdominal pain [ ] ; Constipation [ ] ; Diarrhea [ ] ; BRBPR [ ]   GU: Hematuria[ ] ; Dysuria [ ] ; Nocturia[ ]   Vascular: Pain in legs with walking [ ] ; Pain in feet with lying flat [ ] ; Non-healing sores [ ] ; Stroke [ ] ; TIA [ ] ; Slurred speech [ ] ;  Neuro: Headaches[ ] ; Vertigo[ ] ; Seizures[ ] ; Paresthesias[ ] ;Blurred vision [ ] ; Diplopia [ ] ; Vision changes [ ]   Ortho/Skin: Arthritis [name] ]; Joint pain [ ] ; Muscle pain [ ] ; Joint swelling [ ] ; Back Pain [ ] ; Rash [ ]   Psych: Depression[ ] ; Anxiety[ ]   Heme: Bleeding problems [ ] ; Clotting disorders [ ] ; Anemia [ ]   Endocrine: Diabetes [ ] ; Thyroid dysfunction[ ]   Home Medications Prior to Admission medications   Medication Sig Start Date End Date Taking? Authorizing Provider  amitriptyline (ELAVIL) 50 MG tablet Take 50 mg by mouth at bedtime. 01/15/17  Yes [provider]  CHANTIX CONTINUING MONTH PAK 1 MG tablet Take 1 mg by mouth 2 (two) times daily. 01/29/17  Yes [provider]  cholecalciferol (VITAMIN D3) 25 MCG (1000 UT) tablet Take 3,000 Units by mouth daily.   Yes [provider]  ciclopirox (PENLAC) 8 % solution Apply 1 application topically 2 (two) times daily as needed (onychomycosis).  03/24/17  Yes [provider]  clotrimazole-betamethasone (LOTRISONE) cream Apply 1 application topically 2 (two) times daily as needed (irritation).  01/31/17   Yes [provider]  cyclobenzaprine (FLEXERIL) 10 MG tablet Take 10 mg by mouth 3 (three) times daily. 01/14/17  Yes [provider]  docusate sodium (COLACE) 100 MG capsule Take 100 mg by mouth 2 (two) times daily.  02/09/16  Yes [provider]  furosemide (LASIX) 40 MG tablet Take 1 tablet (40 mg total) by mouth 2 (two) times daily. 07/22/18  Yes Virgel Manifold, MD  gabapentin (NEURONTIN) 600 MG tablet Take 600 mg by mouth 4 (four) times daily.  01/17/17  Yes [provider]  insulin regular (NOVOLIN R,HUMULIN R) 100 units/mL injection Inject 5-10 Units into the skin daily as needed for high blood sugar (depends on blood sugar readings if takes  5-10 units).  03/14/17 08/08/18 Yes [provider]  medroxyPROGESTERone (DEPO-PROVERA)  150 MG/ML injection Inject 150 mg every 3 (three) months into the muscle. Last dose 06/28/2018 01/20/17  Yes [provider]  meloxicam (MOBIC) 15 MG tablet Take 15 mg by mouth daily. 02/01/17  Yes [provider]  metFORMIN (GLUCOPHAGE-XR) 500 MG 24 hr tablet Take 500 mg 2 (two) times daily by mouth.  01/20/17  Yes [provider]  Multiple Vitamin (MULTIVITAMIN WITH MINERALS) TABS tablet Take 1 tablet by mouth daily.   Yes [provider]  oxyCODONE-acetaminophen (PERCOCET/ROXICET) 5-325 MG tablet Take 1 tablet by mouth 4 (four) times daily.   Yes [provider]  OXYCONTIN 10 MG 12 hr tablet Take 10 mg by mouth every 12 (twelve) hours. 02/03/17  Yes [provider]  polyethylene glycol powder (GLYCOLAX/MIRALAX) powder Take 17 g daily as needed by mouth for mild constipation. Averages once a month 02/09/16  Yes [provider]  potassium chloride SA (K-DUR,KLOR-CON) 20 MEQ tablet Take 1 tablet (20 mEq total) by mouth 2 (two) times daily. 07/22/18  Yes Virgel Manifold, MD  rosuvastatin (CRESTOR) 10 MG tablet Take 10 mg by mouth at bedtime. 01/20/17  Yes [provider]    XARELTO 20 MG TABS tablet Take 20 mg by mouth daily. 02/05/17  Yes [provider]    Past Medical History: Past Medical History:  Diagnosis Date  . Arthritis   . CHF (congestive heart failure) ([place])   . COPD (chronic obstructive pulmonary disease) ([place])   . Diabetes mellitus ([place])    type 2   . DVT (deep venous thrombosis) ([place]) dx 10-2016   RLE   . HLD (hyperlipidemia)    on crestor  . Pneumonia    09/2016  . Sleep apnea    per patient ;been dx does not use CPAP does not tolerate    Past Surgical History: Past Surgical History:  Procedure Laterality Date  . ACHILLES TENDON REPAIR    . HERNIA REPAIR     x2; hiatal and umbilical   . IVC FILTER INSERTION N/A 04/25/2017   Procedure: IVC Filter Insertion;  Surgeon: Serafina Mitchell, MD;  Location: [facility];  Service: Cardiovascular;  Laterality: N/A;  . IVC FILTER REMOVAL N/A 10/24/2017   Procedure: IVC FILTER REMOVAL;  Surgeon: Serafina Mitchell, MD;  Location: [facility];  Service: Cardiovascular;  Laterality: N/A;  . JOINT REPLACEMENT     right hip 07/14/17 Anna. Ninfa Linden  . KNEE ARTHROSCOPY Right 2015  . plantar    . TOTAL HIP ARTHROPLASTY Left 04/28/2017   Procedure: LEFT TOTAL HIP ARTHROPLASTY ANTERIOR APPROACH;  Surgeon: Mcarthur Rossetti, MD;  Location: [facility];  Service: Orthopedics;  Laterality: Left;  . TOTAL HIP ARTHROPLASTY Right 07/14/2017   Procedure: RIGHT TOTAL HIP ARTHROPLASTY ANTERIOR APPROACH;  Surgeon: Mcarthur Rossetti, MD;  Location: [facility];  Service: Orthopedics;  Laterality: Right;  . TOTAL KNEE ARTHROPLASTY Right 04/04/2016   Procedure: TOTAL KNEE ARTHROPLASTY;  Surgeon: Dorna Leitz, MD;  Location: [facility];  Service: Orthopedics;  Laterality: Right;    Family History: Family History  Problem Relation Age of Onset  . Hypertension Mother   . Hypertension Anna Rowe   . Hypertension Sister     Social History: Social History   Socioeconomic History  . Marital status:  Married    Spouse name: Not on file  . Number of children: Not on file  . Years of education: Not on file  . Highest education level: Not on file  Occupational  History  . Not on file  Social Needs  . Financial resource strain: Not on file  . Food insecurity:    Worry: Not on file    Inability: Not on file  . Transportation needs:    Medical: Not on file    Non-medical: Not on file  Tobacco Use  . Smoking status: Current Every Day Smoker    Packs/day: 0.50    Years: 17.00    Pack years: 8.50    Types: Cigarettes  . Smokeless tobacco: Never Used  . Tobacco comment: 3 Loose cigarettes in last week  Substance and Sexual Activity  . Alcohol use: No    Comment: seldom   . Drug use: No  . Sexual activity: Not Currently  Lifestyle  . Physical activity:    Days per week: Not on file    Minutes per session: Not on file  . Stress: Not on file  Relationships  . Social connections:    Talks on phone: Not on file    Gets together: Not on file    Attends religious service: Not on file    Active member of club or organization: Not on file    Attends meetings of clubs or organizations: Not on file    Relationship status: Not on file  Other Topics Concern  . Not on file  Social History Narrative  . Not on file    Allergies:  Allergies  Allergen Reactions  . Methocarbamol Anaphylaxis and Swelling  . Tramadol Anaphylaxis    Tolerates Dilaudid, Oxycontin 04/04/16  . Bee Venom Hives    Objective:    Vital Signs:   Temp:  [97.3 F (36.3 C)-98.7 F (37.1 C)] 98 F (36.7 C) (12/27 0453) Pulse Rate:  [84-103] 98 (12/27 0834) Resp:  [17-27] 18 (12/27 0818) BP: (98-130)/(50-88) 122/86 (12/27 0818) SpO2:  [88 %-98 %] 95 % (12/27 0818) Weight:  [141.7 kg] 141.7 kg (12/27 0453) Last BM Date: 08/07/18  Weight change: Filed Weights   08/08/18 0642 08/09/18 0114 08/10/18 0453  Weight: (!) 143.8 kg (!) 144.6 kg (!) 141.7 kg    Intake/Output:   Intake/Output Summary (Last 24  hours) at 08/10/2018 1044 Last data filed at 08/10/2018 0500 Gross per 24 hour  Intake 1060 ml  Output 3500 ml  Net -2440 ml      Physical Exam    General:  Obese. No resp difficulty HEENT: normal Neck: supple. Thick neck. Difficult to assess JVP. Carotids 2+ bilat; no bruits. No lymphadenopathy or thyromegaly appreciated. Cor: PMI nondisplaced. Regular rate & rhythm, slightly tachy. +s3 Lungs: distant, clear Abdomen: obese, soft, nontender, nondistended. No hepatosplenomegaly. No bruits or masses. Good bowel sounds. Extremities: no cyanosis, clubbing, rash, BLE 1+ edema, tight. BLE extremities cool.  Neuro: alert & orientedx3, cranial nerves grossly intact. moves all 4 extremities w/o difficulty. Affect pleasant   Telemetry   NSR 90s. 6 beats NSVT overnight. Personally reviewed.   EKG    12/25: sinus tach 102 bpm with LVH and biatrial enlargement. Personally reviewed.   Labs   Basic Metabolic Panel: Recent Labs  Lab 08/08/18 0057 08/08/18 0749 08/09/18 0354 08/10/18 0552  NA 138  --  139 139  K 4.2  --  4.0 3.9  CL 101  --  101 100  CO2 26  --  24 28  GLUCOSE 117*  --  115* 140*  BUN 17  --  16 16  CREATININE 1.23* 1.01* 1.11* 1.14*  CALCIUM 9.5  --  8.9 8.7*    Liver Function Tests: No results for input(s): AST, ALT, ALKPHOS, BILITOT, PROT, ALBUMIN in the last 168 hours. No results for input(s): LIPASE, AMYLASE in the last 168 hours. No results for input(s): AMMONIA in the last 168 hours.  CBC: Recent Labs  Lab 08/08/18 0057 08/10/18 0552  WBC 8.9 7.7  NEUTROABS 3.9  --   HGB 13.9 13.6  HCT 45.9 43.3  MCV 70.5* 70.1*  PLT 397 408*    Cardiac Enzymes: No results for input(s): CKTOTAL, CKMB, CKMBINDEX, TROPONINI in the last 168 hours.  BNP: BNP (last 3 results) Recent Labs    07/22/18 1756 08/08/18 0057  BNP 1,149.2* 810.5*    ProBNP (last 3 results) No results for input(s): PROBNP in the last 8760 hours.   CBG: Recent Labs  Lab  08/09/18 0734 08/09/18 1142 08/09/18 1638 08/09/18 2140 08/10/18 0850  GLUCAP 121* 169* 125* 119* 130*    Coagulation Studies: No results for input(s): LABPROT, INR in the last 72 hours.   Imaging   No results found.   Medications:     Current Medications: . [MAR Hold] amitriptyline  50 mg Oral QHS  . [MAR Hold] carvedilol  3.125 mg Oral BID WC  . [MAR Hold] cyclobenzaprine  10 mg Oral TID  . digoxin  0.125 mg Oral Daily  . [MAR Hold] docusate sodium  100 mg Oral BID  . furosemide  80 mg Intravenous BID  . [MAR Hold] gabapentin  600 mg Oral QID  . [MAR Hold] insulin aspart  0-15 Units Subcutaneous TID WC  . [MAR Hold] ipratropium-albuterol  3 mL Nebulization TID  . [MAR Hold] losartan  25 mg Oral Daily  . [MAR Hold] mometasone-formoterol  2 puff Inhalation BID  . [MAR Hold] multivitamin with minerals  1 tablet Oral Daily  . [MAR Hold] oxyCODONE  10 mg Oral Q12H  . potassium chloride  20 mEq Oral BID  . [MAR Hold] rosuvastatin  10 mg Oral QHS  . [MAR Hold] sodium chloride flush  3 mL Intravenous Q12H  . sodium chloride flush  3 mL Intravenous Q12H  . spironolactone  12.5 mg Oral Daily  . [MAR Hold] varenicline  1 mg Oral BID WC    Infusions: . [MAR Hold] sodium chloride    . sodium chloride    . sodium chloride 10 mL/hr at 08/10/18 0636  . milrinone 0.25 mcg/kg/min (08/10/18 1034)      Patient Profile   Anna Rowe is a 42 y.o. female with a history of hyperlipidemia, DM, COPD, sleep apnea noncompliant with CPAP, chronic DVT (diagnosed 2018) on Xarelto, tobacco use, right knee replacement 2015, and COPD.  She [facility] to [facility] 08/08/18 with worsening LE edema. Echo showed newly reduced EF 15-20%. [facility] 12/27 showed marked volume overload and low output and minimal nonobstructive CAD.  Assessment/Plan   1. Acute systolic HF due to NICM. Unclear etiology. ?viral illness in October. Also noncompliant with CPAP. A1C only 7.2. HIV negative. Has  HTN, but seems well controlled. Check TSH tomorrow. - Echo 12/25: EF 15-20%, severe diffuse HK and septal dyskinesis, grade 2 DD, mod MR, LA moderately dilated, RV moderately dilated with moderately reduced function, PA peak pressure 35 mm Hg - R/LHC today showed marked volume overload with low output, severe NICM with EF 15%, and minimal non-obstructive CAD. - Volume status overloaded. - Increase lasix to 80 mg IV BID - Hold BB with low output - Continue losartan 25 mg daily. -  Start digoxin 0.125 mg daily - Consider spiro tomorrow if BP stable on milrinone. Creatinine 1.14, K 3.9 today - Start milrinone 0.25 mcg/kg/min.  - Place PICC to monitor CVP and co-ox. - She is on Depo injections for birth control. Pregnancy test negative.   2. Minimal nonobstructive CAD - LHC 12/27: 30% Prox LAD - Continue crestor. No ASA with Xarelto use  3. Chronic DVT - Continue Xarelto 20 mg daily  4. OSA - Has CPAP at home, but has been noncompliant because she thought OSA had resolved. Last sleep study 6-7 years ago. She has a repeat sleep study scheduled for 1/21.  5. COPD - Current smoker 1/2 ppd. Started on Chantix. Encouraged cessation.  6. NSVT - Keep K >4.0, Mag > 2.0. Add on mag to am labs  Medication concerns reviewed with patient and pharmacy team. Barriers identified: none at this time.   Length of Stay: Register, NP  08/10/2018, 10:44 AM  Advanced Heart Failure Team Pager [phone] (M-F; 7a - 4p)  Please contact [facility] for night-coverage after hours (4p -7a ) and weekends on [email or web address]  Patient seen and examined with the above-signed Advanced Practice Provider and/or Housestaff. I personally reviewed laboratory data, imaging studies and relevant notes. I independently examined the patient and formulated the important aspects of the plan. I have edited the note to reflect any of my changes or salient points. I have personally discussed the plan with the patient and/or  family.  42 y/o woman with morbid obesity, COPD with ongoing tobacco use, DM2, OSA admitted with new onset systolic HF with EF 26-37%. Cath today with minimal CAD but marked volume overload and low output. We have started inotropic therapy and will increase lasix. Place PICC to follow CVP and co-ox. Unfortunately too large for cMRI. Continue Xarelto for chronic DVT. Will need weight loss and to be compliant with CPAP.  Glori Bickers, MD  3:16 PM

## 2018-08-10 NOTE — Progress Notes (Signed)
Patient to unit from cath lab and oriented to room and unit. R wrist and L AC cath sites with coban and clean, dry and intact with no negative s/s. CHG bath given, telemetry applied and CCMD notified.

## 2018-08-11 LAB — BASIC METABOLIC PANEL
Anion gap: 9 (ref 5–15)
BUN: 12 mg/dL (ref 6–20)
CO2: 30 mmol/L (ref 22–32)
Calcium: 8.4 mg/dL — ABNORMAL LOW (ref 8.9–10.3)
Chloride: 99 mmol/L (ref 98–111)
Creatinine, Ser: 0.97 mg/dL (ref 0.44–1.00)
GFR calc Af Amer: >60 mL/min (ref >60)
GFR calc non Af Amer: >60 mL/min (ref >60)
Glucose, Bld: 146 mg/dL — ABNORMAL HIGH (ref 70–99)
Potassium: 3.7 mmol/L (ref 3.5–5.1)
Sodium: 138 mmol/L (ref 135–145)

## 2018-08-11 LAB — GLUCOSE, CAPILLARY
Glucose-Capillary: 149 mg/dL — ABNORMAL HIGH (ref 70–99)
Glucose-Capillary: 172 mg/dL — ABNORMAL HIGH (ref 70–99)
Glucose-Capillary: 118 mg/dL — ABNORMAL HIGH (ref 70–99)
Glucose-Capillary: 106 mg/dL — ABNORMAL HIGH (ref 70–99)

## 2018-08-11 LAB — TSH: TSH: 3.444 u[IU]/mL (ref 0.350–4.500)

## 2018-08-11 LAB — COOXEMETRY PANEL
Carboxyhemoglobin: 1.4 % (ref 0.5–1.5)
Methemoglobin: 1.5 % (ref 0.0–1.5)
O2 Saturation: 69.2 %
Total hemoglobin: 13.7 g/dL (ref 12.0–16.0)

## 2018-08-11 LAB — MAGNESIUM: Magnesium: 2.1 mg/dL (ref 1.7–2.4)

## 2018-08-11 MED ORDER — POTASSIUM CHLORIDE CRYS ER 20 MEQ PO TBCR
40.0000 meq | EXTENDED_RELEASE_TABLET | Freq: Once | ORAL | Status: AC
  Administered 2018-08-11: 40 meq via ORAL
  Filled 2018-08-11: qty 2

## 2018-08-11 MED ORDER — POTASSIUM CHLORIDE CRYS ER 20 MEQ PO TBCR
40.0000 meq | EXTENDED_RELEASE_TABLET | Freq: Two times a day (BID) | ORAL | Status: DC
  Administered 2018-08-11 – 2018-08-12 (×3): 40 meq via ORAL
  Filled 2018-08-11 (×3): qty 2

## 2018-08-11 MED ORDER — METOLAZONE 5 MG PO TABS
2.5000 mg | ORAL_TABLET | Freq: Two times a day (BID) | ORAL | Status: DC
  Administered 2018-08-11 – 2018-08-12 (×3): 2.5 mg via ORAL
  Filled 2018-08-11 (×3): qty 1

## 2018-08-11 NOTE — Progress Notes (Signed)
Patient has 5 beats of V-tach. No sign of distress noted will continue to monitor.

## 2018-08-11 NOTE — Progress Notes (Signed)
PROGRESS NOTE  Anna Rowe ANV:[id number] DOB: 03-Mar-1976 DOA: 08/08/2018 [facility]CP: [facility]  HPI/Recap of past 24 hours: Anna Rowe a 42 y.o.femalewith medical history significant ofDM2, prior RLE DVT on chronic Xarelto now, ? CHF though no formal diagnosis her PCP put her on Lasix a few months back for leg swelling.  Patient presents to the ED with several week history of worsening leg swelling, especially worse over past 3-4 dayswithorthopnea and DOE. She was admitted for evaluation of CHF.   08/10/2018: Patient seen and examined at bedside post left and right cath.  Denies any chest pain or dyspnea at rest.   08/11/2018: Patient has no new complaints.  She denies chest pain, palpitations, or dyspnea at rest.  Assessment/Plan: Principal Problem:   Acute diastolic CHF (congestive heart failure) (HCC) Active Problems:   Chronic bilateral low back pain   DM2 (diabetes mellitus, type 2) (HCC)   History of DVT (deep vein thrombosis)   CHF (congestive heart failure) (HCC)  Acute on chronic systolic and diastolic heart failure:  Abnormal echocardiogram showing severe global hypokinesis with LVEF 15 to 20% on 08/08/2018.  Continue IV lasix 80 mg BID for diuresis Pedal edema is improving, has Unna boot in place.  And pt reports her breathing has improved.  Continue with daily weights, strict intake and output and watch renal parameters and electrolytes while on IV lasix.  Post left and right heart cath Recommendations per cardiology Independently reviewed chest x-ray done during this admission which revealed cardiomegaly with increase in pulmonary vascularity suggestive of pulmonary edema Maintain O2 saturation greater than 92% -3 L in the last 24 hours -13.5 L since admission On digoxin, Lasix, metolazone, milrinone drip, spironolactone, potassium supplement  Sinus tachycardia, improving Cardiac medications per  cardiology  Tobacco abuse: Counseled , currently on chantix.   Hyperlipidemia:  Lipid panel reviewed , currently on 10 mg of crestor.   H/o COPD:  Continue duo nebs and Dulera  Chronic pain syndrome:  Resume her home meds.  H/o of DVT; Repeat US of the extremities does not show any new DVT.  On xarelto , continue the same.    Type 2 Diabetes Mellitus: CBG'S appear to be better controlled.  CBG (last 3)  RecentLabs(last2labs)  Recent Labs    08/08/18 2059 08/09/18 0734 08/09/18 1142  GLUCAP 103* 121* 169*     Resume SSI.  Hemoglobin A1c 7.2 on 08/09/2018.    DVT prophylaxis: Xarelto.  Code Status: Full code.  Family Communication: multiple family members at bedside.  Disposition Plan: pending cardiology recommendations.     Objective: Vitals:   08/11/18 0550 08/11/18 0916 08/11/18 1119 08/11/18 1448  BP:   93/77   Pulse:  98 (!) 104 (!) 103  Resp: (!) 23 18 18 20   Temp:   97.6 F (36.4 C)   TempSrc:   Oral   SpO2:  90% 93% 90%  Weight: (!) 142.9 kg     Height:        Intake/Output Summary (Last 24 hours) at 08/11/2018 1623 Last data filed at 08/11/2018 1332 Gross per 24 hour  Intake 612.95 ml  Output 4200 ml  Net -3587.05 ml   Filed Weights   08/09/18 0114 08/10/18 0453 08/11/18 0550  Weight: (!) 144.6 kg (!) 141.7 kg (!) 142.9 kg    Exam:  . General: 42 y.o. year-old female developed well-nourished in no acute distress.  Alert and oriented x3.  . Cardiovascular: Regular rate and rhythm with  no rubs or gallops.  No JVD or thyromegaly noted. Marland Kitchen Respiratory: Clear to auscultation with no wheezes or rales.  Good inspiratory effort. . Abdomen: Soft nontender nondistended with normal bowel sounds x4 quadrants. . Musculoskeletal: 1+ pitting edema lower extremities bilaterally. 2/4 pulses in all 4 extremities. Marland Kitchen Psychiatry: Mood is appropriate for condition and setting   Data Reviewed: CBC: Recent Labs  Lab 08/08/18 0057  08/10/18 0552  WBC 8.9 7.7  NEUTROABS 3.9  --   HGB 13.9 13.6  HCT 45.9 43.3  MCV 70.5* 70.1*  PLT 397 620*   Basic Metabolic Panel: Recent Labs  Lab 08/08/18 0057 08/08/18 0749 08/09/18 0354 08/10/18 0552 08/11/18 0524  NA 138  --  139 139 138  K 4.2  --  4.0 3.9 3.7  CL 101  --  101 100 99  CO2 26  --  24 28 30   GLUCOSE 117*  --  115* 140* 146*  BUN 17  --  16 16 12   CREATININE 1.23* 1.01* 1.11* 1.14* 0.97  CALCIUM 9.5  --  8.9 8.7* 8.4*  MG  --   --   --  2.2 2.1   GFR: Estimated Creatinine Clearance: 120.5 mL/min (by C-G formula based on SCr of 0.97 mg/dL). Liver Function Tests: No results for input(s): AST, ALT, ALKPHOS, BILITOT, PROT, ALBUMIN in the last 168 hours. No results for input(s): LIPASE, AMYLASE in the last 168 hours. No results for input(s): AMMONIA in the last 168 hours. Coagulation Profile: No results for input(s): INR, PROTIME in the last 168 hours. Cardiac Enzymes: No results for input(s): CKTOTAL, CKMB, CKMBINDEX, TROPONINI in the last 168 hours. BNP (last 3 results) No results for input(s): PROBNP in the last 8760 hours. HbA1C: Recent Labs    08/09/18 0354  HGBA1C 7.2*   CBG: Recent Labs  Lab 08/10/18 1625 08/10/18 2245 08/11/18 0545 08/11/18 1125 08/11/18 1611  GLUCAP 122* 125* 149* 172* 118*   Lipid Profile: Recent Labs    08/09/18 0354  CHOL 100  HDL 20*  LDLCALC 61  TRIG 94  CHOLHDL 5.0   Thyroid Function Tests: Recent Labs    08/11/18 0524  TSH 3.444   Anemia Panel: No results for input(s): VITAMINB12, FOLATE, FERRITIN, TIBC, IRON, RETICCTPCT in the last 72 hours. Urine analysis:    Component Value Date/Time   COLORURINE YELLOW 07/22/2018 1729   APPEARANCEUR CLEAR 07/22/2018 1729   LABSPEC 1.015 07/22/2018 1729   PHURINE 6.0 07/22/2018 1729   GLUCOSEU NEGATIVE 07/22/2018 1729   HGBUR MODERATE (A) 07/22/2018 1729   BILIRUBINUR NEGATIVE 07/22/2018 1729   KETONESUR NEGATIVE 07/22/2018 1729   PROTEINUR  NEGATIVE 07/22/2018 1729   UROBILINOGEN 1.0 12/13/2012 0230   NITRITE NEGATIVE 07/22/2018 1729   LEUKOCYTESUR TRACE (A) 07/22/2018 1729   Sepsis Labs: @LABRCNTIP (procalcitonin:4,lacticidven:4)  )No results found for this or any previous visit (from the past 240 hour(s)).    Studies: [place] Ekg Site Rite  Result Date: 08/10/2018 If [facility] image not attached, placement could not be confirmed due to current cardiac rhythm.   Scheduled Meds: . amitriptyline  50 mg Oral QHS  . cyclobenzaprine  10 mg Oral TID  . digoxin  0.125 mg Oral Daily  . docusate sodium  100 mg Oral BID  . furosemide  80 mg Intravenous BID  . gabapentin  600 mg Oral QID  . insulin aspart  0-15 Units Subcutaneous TID WC  . ipratropium-albuterol  3 mL Nebulization TID  . losartan  25 mg  Oral Daily  . metolazone  2.5 mg Oral BID  . mometasone-formoterol  2 puff Inhalation BID  . multivitamin with minerals  1 tablet Oral Daily  . oxyCODONE  10 mg Oral Q12H  . potassium chloride  40 mEq Oral BID  . rivaroxaban  20 mg Oral Q supper  . rosuvastatin  10 mg Oral QHS  . sodium chloride flush  10-40 mL Intracatheter Q12H  . sodium chloride flush  3 mL Intravenous Q12H  . spironolactone  12.5 mg Oral Daily  . varenicline  1 mg Oral BID WC    Continuous Infusions: . sodium chloride    . sodium chloride    . milrinone 0.25 mcg/kg/min (08/11/18 1141)     LOS: 2 days     Kayleen Memos, MD [facility] Pager [phone]  If 7PM-7AM, please contact night-coverage [email or web address] Password [facility] 08/11/2018, 4:23 PM

## 2018-08-11 NOTE — Progress Notes (Signed)
Advanced Heart Failure Rounding Note   Subjective:    PICC line placed last night.   Remains on milrinone 0.25. Co-ox 69%. CVP pending.   On IV lasix. Good diuresis but weight up 3 pounds. Has multiple drinks and ICE chips on her tray. C/o about Renal Diet.   Denies SOB, orthopnea or PND. Legs wrapped with UNNA boots.   Cath 12/27 Findings: Ao = 99/75 (81) LV = 93/31 RA = 22 RV = 54/25 PA = 58/24 (41) PCW = 30 Fick cardiac output/index = 4.1/1.6 SVR = 1163 PVR = 2.6 FA sat = 89% PA sat = 42%, 44% Assessment: 1. 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 Severe NICM with LVEF ~ 15% 3. Marked volume overload with low output   Objective:   Weight Range:  Vital Signs:   Temp:  [97.6 F (36.4 C)-98.8 F (37.1 C)] 97.6 F (36.4 C) (12/28 1119) Pulse Rate:  [49-105] 104 (12/28 1119) Resp:  [18-33] 18 (12/28 1119) BP: (93-141)/(47-78) 93/77 (12/28 1119) SpO2:  [90 %-100 %] 93 % (12/28 1119) Weight:  [142.9 kg] 142.9 kg (12/28 0550) Last BM Date: 08/09/18  Weight change: Filed Weights   08/09/18 0114 08/10/18 0453 08/11/18 0550  Weight: (!) 144.6 kg (!) 141.7 kg (!) 142.9 kg    Intake/Output:   Intake/Output Summary (Last 24 hours) at 08/11/2018 1236 Last data filed at 08/11/2018 1011 Gross per 24 hour  Intake 612.95 ml  Output 4300 ml  Net -3687.05 ml     Physical Exam: General:  Sitting up in bed. . No resp difficulty HEENT: normal Neck: supple. JVP to ear. Carotids 2+ bilat; no bruits. No lymphadenopathy or thryomegaly appreciated. Cor: PMI laterally displaced. Tachy regular rate & rhythm. +s3 Lungs: clear Abdomen: obese soft, nontender, nondistended. Good bowel sounds. Extremities: no cyanosis, clubbing, rash, 3+ edema with UNNA boots Neuro: alert & orientedx3, cranial nerves grossly intact. moves all 4 extremities w/o difficulty. Affect pleasant   Telemetry: Sinus tach 100-110 Personally reviewed   Labs: Basic Metabolic Panel: Recent Labs   Lab 08/08/18 0057 08/08/18 0749 08/09/18 0354 08/10/18 0552 08/11/18 0524  NA 138  --  139 139 138  K 4.2  --  4.0 3.9 3.7  CL 101  --  101 100 99  CO2 26  --  24 28 30   GLUCOSE 117*  --  115* 140* 146*  BUN 17  --  16 16 12   CREATININE 1.23* 1.01* 1.11* 1.14* 0.97  CALCIUM 9.5  --  8.9 8.7* 8.4*  MG  --   --   --  2.2 2.1    Liver Function Tests: No results for input(s): AST, ALT, ALKPHOS, BILITOT, PROT, ALBUMIN in the last 168 hours. No results for input(s): LIPASE, AMYLASE in the last 168 hours. No results for input(s): AMMONIA in the last 168 hours.  CBC: Recent Labs  Lab 08/08/18 0057 08/10/18 0552  WBC 8.9 7.7  NEUTROABS 3.9  --   HGB 13.9 13.6  HCT 45.9 43.3  MCV 70.5* 70.1*  PLT 397 408*    Cardiac Enzymes: No results for input(s): CKTOTAL, CKMB, CKMBINDEX, TROPONINI in the last 168 hours.  BNP: BNP (last 3 results) Recent Labs    07/22/18 1756 08/08/18 0057  BNP 1,149.2* 810.5*    ProBNP (last 3 results) No results for input(s): PROBNP in the last 8760 hours.    Other results:  Imaging: [place] Ekg Site Rite  Result Date: 08/10/2018 If [employer] not  attached, placement could not be confirmed due to current cardiac rhythm.     Medications:     Scheduled Medications: . amitriptyline  50 mg Oral QHS  . cyclobenzaprine  10 mg Oral TID  . digoxin  0.125 mg Oral Daily  . docusate sodium  100 mg Oral BID  . furosemide  80 mg Intravenous BID  . gabapentin  600 mg Oral QID  . insulin aspart  0-15 Units Subcutaneous TID WC  . ipratropium-albuterol  3 mL Nebulization TID  . losartan  25 mg Oral Daily  . mometasone-formoterol  2 puff Inhalation BID  . multivitamin with minerals  1 tablet Oral Daily  . oxyCODONE  10 mg Oral Q12H  . potassium chloride  20 mEq Oral BID  . rivaroxaban  20 mg Oral Q supper  . rosuvastatin  10 mg Oral QHS  . sodium chloride flush  10-40 mL Intracatheter Q12H  . sodium chloride flush  3 mL Intravenous Q12H    . spironolactone  12.5 mg Oral Daily  . varenicline  1 mg Oral BID WC     Infusions: . sodium chloride    . sodium chloride    . milrinone 0.25 mcg/kg/min (08/11/18 1141)     PRN Medications:  sodium chloride, sodium chloride, acetaminophen, ipratropium-albuterol, ondansetron (ZOFRAN) IV, oxyCODONE-acetaminophen, polyethylene glycol, sodium chloride flush, sodium chloride flush   Assessment:   Anna Rowe is a 42 y.o. female with a history of hyperlipidemia, DM, COPD, sleep apnea noncompliant with CPAP, chronic DVT (diagnosed 2018) on Xarelto, tobacco use, right knee replacement 2015, and COPD.  She presented to [facility] 08[facility]08[facility]18 with worsening LE edema. Echo showed newly reduced EF 15-20%. [facility] 12/27 showed marked volume overload and low output and minimal nonobstructive CAD.   Plan/Discussion:    1. Acute systolic HF due to NICM. Unclear etiology. ?viral illness in October vs CPAP. Also noncompliant with CPAP. A1C only 7.2. HIV negative. Has HTN, but seems well controlled. - Echo 12/25: EF 15-20%, severe diffuse HK and septal dyskinesis, grade 2 DD, mod MR, LA moderately dilated, RV moderately dilated with moderately reduced function, PA peak pressure 35 mm Hg - [facility] 12/27 showed marked volume overload with low output, severe NICM with EF 15%, and minimal non-obstructive CAD. - Milrinone started 12/27. Co-ox much improved today now 69% - Remains markedly overloaded with weight up. Has had good urine output on lasix 80 IV tid but taking in a lot of fluids. Will add metolazone 2.5 bid. Limit fluid intake - Hold BB with low output - Continue losartan 25 mg daily. BP too low for Entresto - Continue digoxin 0.125 mg daily - Continue spiro 12.5 - Start milrinone 0.25 mcg/kg/min.  - Set-up CV monitoring - She is on Depo injections for birth control. Pregnancy test negative.   2. Minimal nonobstructive CAD - [facility] 12/27: 30% Prox LAD - Continue crestor. No ASA  with Xarelto use  3. Chronic DVT - Continue Xarelto 20 mg daily  4. OSA - Has CPAP at home, but has been noncompliant because she thought OSA had resolved. Last sleep study 6-7 years ago. She has a repeat sleep study scheduled for 1/21 and they are discussing need for BIPAP [facility])  5. COPD - Current smoker 1/2 ppd. Started on Chantix. Encouraged cessation.  6. NSVT - Keep K >4.0, Mag > 2.0. Add on mag to am labs    Length of Stay: 2   Glori Bickers MD 08/11/2018, 12:36 PM  Advanced  Heart Failure Team Pager [phone] (M-F; [place])  Please contact [facility] for night-coverage after hours (4p -7a ) and weekends on [email or web address]

## 2018-08-12 LAB — BASIC METABOLIC PANEL
Anion gap: 11 (ref 5–15)
BUN: 19 mg/dL (ref 6–20)
CO2: 29 mmol/L (ref 22–32)
Calcium: 9 mg/dL (ref 8.9–10.3)
Chloride: 97 mmol/L — ABNORMAL LOW (ref 98–111)
Creatinine, Ser: 1.19 mg/dL — ABNORMAL HIGH (ref 0.44–1.00)
GFR calc Af Amer: >60 mL/min (ref >60)
GFR calc non Af Amer: 56 mL/min — ABNORMAL LOW (ref >60)
Glucose, Bld: 147 mg/dL — ABNORMAL HIGH (ref 70–99)
Potassium: 3.9 mmol/L (ref 3.5–5.1)
Sodium: 137 mmol/L (ref 135–145)

## 2018-08-12 LAB — MAGNESIUM: Magnesium: 2.4 mg/dL (ref 1.7–2.4)

## 2018-08-12 LAB — COOXEMETRY PANEL
Carboxyhemoglobin: 1.3 % (ref 0.5–1.5)
Methemoglobin: 1.5 % (ref 0.0–1.5)
O2 Saturation: 59.7 %
Total hemoglobin: 14.4 g/dL (ref 12.0–16.0)

## 2018-08-12 LAB — GLUCOSE, CAPILLARY
Glucose-Capillary: 143 mg/dL — ABNORMAL HIGH (ref 70–99)
Glucose-Capillary: 105 mg/dL — ABNORMAL HIGH (ref 70–99)
Glucose-Capillary: 114 mg/dL — ABNORMAL HIGH (ref 70–99)
Glucose-Capillary: 143 mg/dL — ABNORMAL HIGH (ref 70–99)

## 2018-08-12 MED ORDER — LOSARTAN POTASSIUM 25 MG PO TABS
25.0000 mg | ORAL_TABLET | Freq: Two times a day (BID) | ORAL | Status: DC
  Administered 2018-08-12: 25 mg via ORAL
  Filled 2018-08-12: qty 1

## 2018-08-12 NOTE — Progress Notes (Signed)
Placed patient on CPAP for the night with pressure set at 10cm.

## 2018-08-12 NOTE — Progress Notes (Signed)
Pt had an episode of 6 beats of V-Tach around 0430. Pt was resting comfortably in bed eyes closed. Pt wore the CPAP ordered for a very short period of time.

## 2018-08-12 NOTE — Progress Notes (Signed)
PROGRESS NOTE  Anna Rowe DOB: June 16, 1976 DOA: 08/08/2018 [facility]: [facility]  HPI/Recap of past 24 hours: Anna Rowe Quick-Hopeis a 42 y.o.femalewith medical history significant ofDM2, prior RLE DVT on chronic Xarelto now, ? CHF though no formal diagnosis her PCP put her on Lasix a few months back for leg swelling.  Patient presents to the ED with several week history of worsening leg swelling, especially worse over past 3-4 dayswithorthopnea and DOE. She was admitted for evaluation of CHF.   08/12/2018: 6 beats of nonsustained V. tach reported.  Denies any chest pain or dyspnea or palpitations.  No new complaints.  Assessment/Plan: Principal Problem:   Acute diastolic CHF (congestive heart failure) (HCC) Active Problems:   Chronic bilateral low back pain   DM2 (diabetes mellitus, type 2) (HCC)   History of DVT (deep vein thrombosis)   CHF (congestive heart failure) (HCC)  Acute on chronic systolic and diastolic heart failure:  Abnormal echocardiogram showing severe global hypokinesis with LVEF 15 to 20% on 08/08/2018.  Continue IV lasix 80 mg BID for diuresis Pedal edema is improving, has Unna boot in place.  And pt reports her breathing has improved.  Continue with daily weights, strict intake and output and watch renal parameters and electrolytes while on IV lasix.  Post left and right heart cath Recommendations per cardiology Independently reviewed chest x-ray done during this admission which revealed cardiomegaly with increase in pulmonary vascularity suggestive of pulmonary edema Maintain O2 saturation greater than 92% -3 L in the last 24 hours -13.5 L since admission On digoxin, Lasix, stop metolazone per cardiology, milrinone drip hopefully start wean off tomorrow, spironolactone, potassium supplement  Nonsustained V. tach 6 beats of nonsustained V. tach reported Cardiology aware and following Medications as  recommended by cardiology  Tobacco abuse: Counseled , currently on chantix.   Hyperlipidemia:  Lipid panel reviewed , currently on 10 mg of crestor.   H/o COPD:  Continue duo nebs and Dulera  Chronic pain syndrome:  Resume her home meds.  H/o of DVT; Repeat US of the extremities does not show any new DVT.  On xarelto , continue the same.   OSA CPAP at night   Type 2 Diabetes Mellitus: CBG'S appear to be better controlled.  CBG (last 3)  RecentLabs(last2labs)  Recent Labs    08/08/18 2059 08/09/18 0734 08/09/18 1142  GLUCAP 103* 121* 169*     Resume SSI.  Hemoglobin A1c 7.2 on 08/09/2018.    DVT prophylaxis: Xarelto.  Code Status: Full code.  Family Communication: multiple family members at bedside.  Disposition Plan: pending cardiology recommendations.     Objective: Vitals:   08/12/18 0500 08/12/18 0738 08/12/18 0830 08/12/18 1451  BP:  124/88    Pulse:  (!) 108 (!) 105 (!) 103  Resp:  18 18 20   Temp:  97.9 F (36.6 C)    TempSrc:  Oral    SpO2:  95% 95% 96%  Weight: (!) 140.3 kg     Height: 6' (1.829 m)       Intake/Output Summary (Last 24 hours) at 08/12/2018 1535 Last data filed at 08/12/2018 1301 Gross per 24 hour  Intake 580 ml  Output 7150 ml  Net -6570 ml   Filed Weights   08/10/18 0453 08/11/18 0550 08/12/18 0500  Weight: (!) 141.7 kg (!) 142.9 kg (!) 140.3 kg    Exam:  . General: 42 y.o. year-old female well-developed well-nourished in no acute distress.  Alert and oriented  x3. . Cardiovascular: Regular rate and rhythm with no rubs or gallops.  No JVD or thyromegaly noted.  Respiratory: Clear to auscultation with no wheezes or rales.  Good inspiratory effort. . Abdomen: Soft nontender nondistended with normal bowel sounds x4 quadrants. . Musculoskeletal: Bilateral lower extremities wrapped in compression stockings. . Psychiatry: Mood is appropriate for condition and setting   Data Reviewed: CBC: Recent Labs    Lab 08/08/18 0057 08/10/18 0552  WBC 8.9 7.7  NEUTROABS 3.9  --   HGB 13.9 13.6  HCT 45.9 43.3  MCV 70.5* 70.1*  PLT 397 528*   Basic Metabolic Panel: Recent Labs  Lab 08/08/18 0057 08/08/18 0749 08/09/18 0354 08/10/18 0552 08/11/18 0524 08/12/18 0414  NA 138  --  139 139 138 137  K 4.2  --  4.0 3.9 3.7 3.9  CL 101  --  101 100 99 97*  CO2 26  --  24 28 30 29   GLUCOSE 117*  --  115* 140* 146* 147*  BUN 17  --  16 16 12 19   CREATININE 1.23* 1.01* 1.11* 1.14* 0.97 1.19*  CALCIUM 9.5  --  8.9 8.7* 8.4* 9.0  MG  --   --   --  2.2 2.1 2.4   GFR: Estimated Creatinine Clearance: 97.2 mL/min (A) (by C-G formula based on SCr of 1.19 mg/dL (H)). Liver Function Tests: No results for input(s): AST, ALT, ALKPHOS, BILITOT, PROT, ALBUMIN in the last 168 hours. No results for input(s): LIPASE, AMYLASE in the last 168 hours. No results for input(s): AMMONIA in the last 168 hours. Coagulation Profile: No results for input(s): INR, PROTIME in the last 168 hours. Cardiac Enzymes: No results for input(s): CKTOTAL, CKMB, CKMBINDEX, TROPONINI in the last 168 hours. BNP (last 3 results) No results for input(s): PROBNP in the last 8760 hours. HbA1C: No results for input(s): HGBA1C in the last 72 hours. CBG: Recent Labs  Lab 08/11/18 1125 08/11/18 1611 08/11/18 2159 08/12/18 0610 08/12/18 1120  GLUCAP 172* 118* 106* 143* 105*   Lipid Profile: No results for input(s): CHOL, HDL, LDLCALC, TRIG, CHOLHDL, LDLDIRECT in the last 72 hours. Thyroid Function Tests: Recent Labs    08/11/18 0524  TSH 3.444   Anemia Panel: No results for input(s): VITAMINB12, FOLATE, FERRITIN, TIBC, IRON, RETICCTPCT in the last 72 hours. Urine analysis:    Component Value Date/Time   COLORURINE YELLOW 07/22/2018 1729   APPEARANCEUR CLEAR 07/22/2018 1729   LABSPEC 1.015 07/22/2018 1729   PHURINE 6.0 07/22/2018 1729   GLUCOSEU NEGATIVE 07/22/2018 1729   HGBUR MODERATE (A) 07/22/2018 1729    BILIRUBINUR NEGATIVE 07/22/2018 1729   KETONESUR NEGATIVE 07/22/2018 1729   PROTEINUR NEGATIVE 07/22/2018 1729   UROBILINOGEN 1.0 12/13/2012 0230   NITRITE NEGATIVE 07/22/2018 1729   LEUKOCYTESUR TRACE (A) 07/22/2018 1729   Sepsis Labs: @LABRCNTIP (procalcitonin:4,lacticidven:4)  )No results found for this or any previous visit (from the past 240 hour(s)).    Studies: No results found.  Scheduled Meds: . amitriptyline  50 mg Oral QHS  . cyclobenzaprine  10 mg Oral TID  . digoxin  0.125 mg Oral Daily  . docusate sodium  100 mg Oral BID  . furosemide  80 mg Intravenous BID  . gabapentin  600 mg Oral QID  . insulin aspart  0-15 Units Subcutaneous TID WC  . ipratropium-albuterol  3 mL Nebulization TID  . losartan  25 mg Oral BID  . mometasone-formoterol  2 puff Inhalation BID  . multivitamin with minerals  1 tablet Oral Daily  . oxyCODONE  10 mg Oral Q12H  . potassium chloride  40 mEq Oral BID  . rivaroxaban  20 mg Oral Q supper  . rosuvastatin  10 mg Oral QHS  . sodium chloride flush  10-40 mL Intracatheter Q12H  . sodium chloride flush  3 mL Intravenous Q12H  . spironolactone  12.5 mg Oral Daily  . varenicline  1 mg Oral BID WC    Continuous Infusions: . sodium chloride    . sodium chloride    . milrinone 0.25 mcg/kg/min (08/12/18 1829)     LOS: 3 days     Kayleen Memos, MD [facility] Pager [phone]  If 7PM-7AM, please contact night-coverage [email or web address] Password [email or web address] 08/12/2018, 3:35 PM

## 2018-08-12 NOTE — Progress Notes (Signed)
Advanced Heart Failure Rounding Note   Subjective:    Remains on milrinone 0.25 and IV lasix 80 IV bid. Metolazone 2.5 bid added yesterday. Weight down 10 pounds overnight. CVP now 8-9.   Breathing better. No orthopnea or PND. Bloating improved.   Co-ox this am 60%. Wore CPAP last night. Had 6 beats NSVT this am.    Cath 12/27 Findings: Ao = 99/75 (81) LV = 93/31 RA = 22 RV = 54/25 PA = 58/24 (41) PCW = 30 Fick cardiac output/index = 4.1/1.6 SVR = 1163 PVR = 2.6 FA sat = 89% PA sat = 42%, 44% Assessment: 1. 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 Severe NICM with LVEF ~ 15% 3. Marked volume overload with low output   Objective:   Weight Range:  Vital Signs:   Temp:  [97.4 F (36.3 C)-98.7 F (37.1 C)] 97.9 F (36.6 C) (12/29 0738) Pulse Rate:  [82-108] 105 (12/29 0830) Resp:  [18-36] 18 (12/29 0830) BP: (93-124)/(63-88) 124/88 (12/29 0738) SpO2:  [90 %-100 %] 95 % (12/29 0830) Weight:  [140.3 kg] 140.3 kg (12/29 0500) Last BM Date: 08/11/18  Weight change: Filed Weights   08/10/18 0453 08/11/18 0550 08/12/18 0500  Weight: (!) 141.7 kg (!) 142.9 kg (!) 140.3 kg    Intake/Output:   Intake/Output Summary (Last 24 hours) at 08/12/2018 1115 Last data filed at 08/12/2018 1045 Gross per 24 hour  Intake 820 ml  Output 6950 ml  Net -6130 ml     Physical Exam: General:  Lying in bed. No resp difficulty HEENT: normal Neck: supple. JVP hard to see (CVP 8-9). Carotids 2+ bilat; no bruits. No lymphadenopathy or thryomegaly appreciated. Cor: PMI laterally displaced. Tachy regular +s3 Lungs: clear Abdomen: obese soft, nontender, nondistended. . No bruits or masses. Good bowel sounds. Extremities: no cyanosis, clubbing, rash, tr edema + UNNA boots Neuro: alert & orientedx3, cranial nerves grossly intact. moves all 4 extremities w/o difficulty. Affect pleasant   Telemetry: Sinus tach 100-110 + NSVT 6 beats Personally reviewed   Labs: Basic Metabolic  Panel: Recent Labs  Lab 08/08/18 0057 08/08/18 0749 08/09/18 0354 08/10/18 0552 08/11/18 0524 08/12/18 0414  NA 138  --  139 139 138 137  K 4.2  --  4.0 3.9 3.7 3.9  CL 101  --  101 100 99 97*  CO2 26  --  24 28 30 29   GLUCOSE 117*  --  115* 140* 146* 147*  BUN 17  --  16 16 12 19   CREATININE 1.23* 1.01* 1.11* 1.14* 0.97 1.19*  CALCIUM 9.5  --  8.9 8.7* 8.4* 9.0  MG  --   --   --  2.2 2.1 2.4    Liver Function Tests: No results for input(s): AST, ALT, ALKPHOS, BILITOT, PROT, ALBUMIN in the last 168 hours. No results for input(s): LIPASE, AMYLASE in the last 168 hours. No results for input(s): AMMONIA in the last 168 hours.  CBC: Recent Labs  Lab 08/08/18 0057 08/10/18 0552  WBC 8.9 7.7  NEUTROABS 3.9  --   HGB 13.9 13.6  HCT 45.9 43.3  MCV 70.5* 70.1*  PLT 397 408*    Cardiac Enzymes: No results for input(s): CKTOTAL, CKMB, CKMBINDEX, TROPONINI in the last 168 hours.  BNP: BNP (last 3 results) Recent Labs    07/22/18 1756 08/08/18 0057  BNP 1,149.2* 810.5*    ProBNP (last 3 results) No results for input(s): PROBNP in the last 8760 hours.    Other  results:  Imaging: [place] Ekg Site Rite  Result Date: 08/10/2018 If Site Rite image not attached, placement could not be confirmed due to current cardiac rhythm.    Medications:     Scheduled Medications: . amitriptyline  50 mg Oral QHS  . cyclobenzaprine  10 mg Oral TID  . digoxin  0.125 mg Oral Daily  . docusate sodium  100 mg Oral BID  . furosemide  80 mg Intravenous BID  . gabapentin  600 mg Oral QID  . insulin aspart  0-15 Units Subcutaneous TID WC  . ipratropium-albuterol  3 mL Nebulization TID  . losartan  25 mg Oral Daily  . metolazone  2.5 mg Oral BID  . mometasone-formoterol  2 puff Inhalation BID  . multivitamin with minerals  1 tablet Oral Daily  . oxyCODONE  10 mg Oral Q12H  . potassium chloride  40 mEq Oral BID  . rivaroxaban  20 mg Oral Q supper  . rosuvastatin  10 mg Oral QHS  .  sodium chloride flush  10-40 mL Intracatheter Q12H  . sodium chloride flush  3 mL Intravenous Q12H  . spironolactone  12.5 mg Oral Daily  . varenicline  1 mg Oral BID WC    Infusions: . sodium chloride    . sodium chloride    . milrinone 0.25 mcg/kg/min (08/12/18 0607)    PRN Medications: sodium chloride, sodium chloride, acetaminophen, ipratropium-albuterol, ondansetron (ZOFRAN) IV, oxyCODONE-acetaminophen, polyethylene glycol, sodium chloride flush, sodium chloride flush   Assessment:   Anna Rowe is a 42 y.o. female with a history of hyperlipidemia, DM, COPD, sleep apnea noncompliant with CPAP, chronic DVT (diagnosed 2018) on Xarelto, tobacco use, right knee replacement 2015, and COPD.  She presented to [facility] 08/08/18 with worsening LE edema. Echo showed newly reduced EF 15-20%. [facility] 12/27 showed marked volume overload and low output and minimal nonobstructive CAD.   Plan/Discussion:    1. Acute systolic HF due to NICM. Unclear etiology. ?viral illness in October vs CPAP. Also noncompliant with CPAP. A1C only 7.2. HIV negative. Has HTN, but seems well controlled. - Echo 12/25: EF 15-20%, severe diffuse HK and septal dyskinesis, grade 2 DD, mod MR, LA moderately dilated, RV moderately dilated with moderately reduced function, PA peak pressure 35 mm Hg - [facility] 12/27 showed marked volume overload with low output, severe NICM with EF 15%, and minimal non-obstructive CAD. - Milrinone started 12/27. Co-ox much improved at 60% - Volume status nearing euvolemia. Will continue IV lasix one more day. Stop metolazone (got this am dose already) - Hold BB with low output - Increase losartan to 25 mg bid. BP too low for Entresto right now- Continue digoxin 0.125 mg daily - Continue spiro 12.5 - Continue milrinone 0.25 mcg/kg/min for today. Hopefully start wean tomorrow - She is on Depo injections for birth control. Pregnancy test negative.  - CR consult for ambulation    2. Minimal nonobstructive CAD - [facility] 12/27: 30% Prox LAD -  No CP. Continue crestor. No ASA with Xarelto use  3. Chronic DVT - Continue Xarelto 20 mg daily  4. OSA - Has CPAP at home, but has been noncompliant because she thought OSA had resolved. Last sleep study 6-7 years ago. She has a repeat sleep study scheduled for 1/21 and they are discussing need for BIPAP [facility]) - Wore CPAP last night  5. COPD - Current smoker 1/2 ppd. Started on Chantix. Encouraged cessation.  6. NSVT - Keep K >4.0, Mag > 2.0.  Length of Stay: 3   Glori Bickers MD 08/12/2018, 11:15 AM  Advanced Heart Failure Team Pager [phone] (M-F; [place])  Please contact [facility] for night-coverage after hours (4p -7a ) and weekends on [email or web address]

## 2018-08-13 ENCOUNTER — Encounter (HOSPITAL_COMMUNITY): Payer: Self-pay | Admitting: Internal Medicine

## 2018-08-13 DIAGNOSIS — I5043 Acute on chronic combined systolic (congestive) and diastolic (congestive) heart failure: Secondary | ICD-10-CM

## 2018-08-13 LAB — BASIC METABOLIC PANEL
Anion gap: 10 (ref 5–15)
BUN: 24 mg/dL — ABNORMAL HIGH (ref 6–20)
CO2: 30 mmol/L (ref 22–32)
Calcium: 9.3 mg/dL (ref 8.9–10.3)
Chloride: 93 mmol/L — ABNORMAL LOW (ref 98–111)
Creatinine, Ser: 1.51 mg/dL — ABNORMAL HIGH (ref 0.44–1.00)
GFR calc Af Amer: 49 mL/min — ABNORMAL LOW (ref >60)
GFR calc non Af Amer: 42 mL/min — ABNORMAL LOW (ref >60)
Glucose, Bld: 133 mg/dL — ABNORMAL HIGH (ref 70–99)
Potassium: 4.6 mmol/L (ref 3.5–5.1)
Sodium: 133 mmol/L — ABNORMAL LOW (ref 135–145)

## 2018-08-13 LAB — GLUCOSE, CAPILLARY
Glucose-Capillary: 148 mg/dL — ABNORMAL HIGH (ref 70–99)
Glucose-Capillary: 153 mg/dL — ABNORMAL HIGH (ref 70–99)
Glucose-Capillary: 142 mg/dL — ABNORMAL HIGH (ref 70–99)
Glucose-Capillary: 138 mg/dL — ABNORMAL HIGH (ref 70–99)

## 2018-08-13 LAB — COOXEMETRY PANEL
Carboxyhemoglobin: 1.1 % (ref 0.5–1.5)
Methemoglobin: 1.3 % (ref 0.0–1.5)
O2 Saturation: 60.8 %
Total hemoglobin: 15.4 g/dL (ref 12.0–16.0)

## 2018-08-13 LAB — MAGNESIUM: Magnesium: 2.3 mg/dL (ref 1.7–2.4)

## 2018-08-13 NOTE — Evaluation (Signed)
Physical Therapy Evaluation Patient Details Name: Anna Rowe MRN: [id number] DOB: [birth year] Today's Date: 08/13/2018   History of Present Illness  42 y.o. female with medical history significant of DM2, prior RLE DVT on chronic Xarelto now, ? CHF though no formal diagnosis her PCP put her on Lasix a few months back for leg swelling.  Clinical Impression  Orders received for PT evaluation. Patient demonstrates deficits in functional mobility at baseline but is overall independent with mobility. Will benefit from continued skilled PT in outpatient setting for knee pain, but overall anticipate patient will progress well during hospital course and does not require acute skilled services. Recommended continued ambulation during stay.     Follow Up Recommendations Outpatient PT    Equipment Recommendations  None recommended by PT    Recommendations for Other Services       Precautions / Restrictions Precautions Precautions: Fall      Mobility  Bed Mobility Overal bed mobility: Modified Independent                Transfers Overall transfer level: Modified independent                  Ambulation/Gait Ambulation/Gait assistance: Modified independent (Device/Increase time) Gait Distance (Feet): 110 Feet Assistive device: Straight cane Gait Pattern/deviations: Antalgic;Wide base of support     General Gait Details: patient able to tolerate increased distance with distraction and conversation, reports RLE knee pain with fatigue. VSS on room air with saturations >90%  Stairs            Wheelchair Mobility    Modified Rankin (Stroke Patients Only)       Balance Overall balance assessment: Mild deficits observed, not formally tested                                           Pertinent Vitals/Pain Pain Assessment: Faces Faces Pain Scale: Hurts even more Pain Location: right leg chronic pain Pain Descriptors /  Indicators: Sore;Aching Pain Intervention(s): Monitored during session    Home Living Family/patient expects to be discharged to:: Private residence Living Arrangements: Spouse/significant other;Children Available Help at Discharge: Family Type of Home: House Home Access: Stairs to enter Entrance Stairs-Rails: Right Entrance Stairs-Number of Steps: 3 Home Layout: One level        Prior Function Level of Independence: Needs assistance   Gait / Transfers Assistance Needed: walks with cane or rollator at baseline     Comments: drives rarely, requires increased time and effort for ADLs and assist for LBD     Hand Dominance   Dominant Hand: Right    Extremity/Trunk Assessment   Upper Extremity Assessment Upper Extremity Assessment: Generalized weakness    Lower Extremity Assessment Lower Extremity Assessment: Generalized weakness(RLE knee pain)       Communication   Communication: No difficulties  Cognition Arousal/Alertness: Awake/alert Behavior During Therapy: WFL for tasks assessed/performed Overall Cognitive Status: Within Functional Limits for tasks assessed                                        General Comments General comments (skin integrity, edema, etc.): increased LE edema    Exercises     Assessment/Plan    PT Assessment Patent does not need any further PT services  PT  Problem List         PT Treatment Interventions      PT Goals (Current goals can be found in the Care Plan section)  Acute Rehab PT Goals Patient Stated Goal: to breathe better PT Goal Formulation: With patient Time For Goal Achievement: 08/27/18 Potential to Achieve Goals: Good    Frequency     Barriers to discharge        Co-evaluation               AM-PAC PT "6 Clicks" Mobility  Outcome Measure Help needed turning from your back to your side while in a flat bed without using bedrails?: None Help needed moving from lying on your back to  sitting on the side of a flat bed without using bedrails?: None Help needed moving to and from a bed to a chair (including a wheelchair)?: None Help needed standing up from a chair using your arms (e.g., wheelchair or bedside chair)?: A Little Help needed to walk in hospital room?: A Little Help needed climbing 3-5 steps with a railing? : A Little 6 Click Score: 21    End of Session   Activity Tolerance: Patient limited by fatigue Patient left: in bed;with call bell/phone within reach Nurse Communication: Mobility status PT Visit Diagnosis: Difficulty in walking, not elsewhere classified (R26.2)    Time: 4628-6381 PT Time Calculation (min) (ACUTE ONLY): 22 min   Charges:   PT Evaluation $PT Eval Moderate Complexity: 1 Mod          Alben Deeds, PT DPT  Board Certified Neurologic Specialist Acute Rehabilitation Services Pager [phone] Office [phone]   Duncan Dull 08/13/2018, 4:52 PM

## 2018-08-13 NOTE — Progress Notes (Signed)
Pt did wear the CPAP fort a short period of time, when asked why she take it of, Pt stated that she did not remember taking it off, it must have happen while she was sleeping.

## 2018-08-13 NOTE — Progress Notes (Addendum)
Advanced Heart Failure Rounding Note   Subjective:    Remains on milrinone 0.25 and IV lasix 80 bid. Received metolazone 2.5 mg x1 yesterday. Weight down another 6 lbs (15 lbs total). CVP ~6 (lots of resp variation) Coox 61%. Creatinine up 1.19 > 1.51. More CPAP most of the night. SBP 89-120s.  Denies SOB ambulating in room. Says she did not urinate much overnight. She took off CPAP in her sleep.   Cath 12/27 Findings: Ao = 99/75 (81) LV = 93/31 RA = 22 RV = 54/25 PA = 58/24 (41) PCW = 30 Fick cardiac output/index = 4.1/1.6 SVR = 1163 PVR = 2.6 FA sat = 89% PA sat = 42%, 44% Assessment: 1. 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 Severe NICM with LVEF ~ 15% 3. Marked volume overload with low output   Objective:   Weight Range:  Vital Signs:   Temp:  [97.9 F (36.6 C)-98.2 F (36.8 C)] 98.2 F (36.8 C) (12/30 0644) Pulse Rate:  [55-108] 106 (12/30 0644) Resp:  [16-20] 18 (12/30 0644) BP: (89-124)/(44-88) 89/44 (12/30 0644) SpO2:  [92 %-96 %] 92 % (12/30 0644) Weight:  [137.8 kg] 137.8 kg (12/30 0333) Last BM Date: 08/11/18  Weight change: Filed Weights   08/11/18 0550 08/12/18 0500 08/13/18 0333  Weight: (!) 142.9 kg (!) 140.3 kg (!) 137.8 kg    Intake/Output:   Intake/Output Summary (Last 24 hours) at 08/13/2018 0737 Last data filed at 08/13/2018 0039 Gross per 24 hour  Intake 1110 ml  Output 7650 ml  Net -6540 ml     Physical Exam: General: No resp difficulty. HEENT: Normal Neck: Supple. JVP difficult. Carotids 2+ bilat; no bruits. No thyromegaly or nodule noted. Cor: PMI laterally displaced. Tachy, regular, +s3 Lungs: CTAB, normal effort. Abdomen: Soft, non-tender, non-distended, no HSM. No bruits or masses. +BS  Extremities: No cyanosis, clubbing, or rash. R and LLE no edema. BLE UNNA boots. TLC PICC LUE Neuro: Alert & orientedx3, cranial nerves grossly intact. moves all 4 extremities w/o difficulty. Affect pleasant   Telemetry: Sinus  tach 100s. 6 beats NSVT yesterday morning. Personally reviewed.    Labs: Basic Metabolic Panel: Recent Labs  Lab 08/09/18 0354 08/10/18 0552 08/11/18 0524 08/12/18 0414 08/13/18 0323  NA 139 139 138 137 133*  K 4.0 3.9 3.7 3.9 4.6  CL 101 100 99 97* 93*  CO2 24 28 30 29 30   GLUCOSE 115* 140* 146* 147* 133*  BUN 16 16 12 19  24*  CREATININE 1.11* 1.14* 0.97 1.19* 1.51*  CALCIUM 8.9 8.7* 8.4* 9.0 9.3  MG  --  2.2 2.1 2.4 2.3    Liver Function Tests: No results for input(s): AST, ALT, ALKPHOS, BILITOT, PROT, ALBUMIN in the last 168 hours. No results for input(s): LIPASE, AMYLASE in the last 168 hours. No results for input(s): AMMONIA in the last 168 hours.  CBC: Recent Labs  Lab 08/08/18 0057 08/10/18 0552  WBC 8.9 7.7  NEUTROABS 3.9  --   HGB 13.9 13.6  HCT 45.9 43.3  MCV 70.5* 70.1*  PLT 397 408*    Cardiac Enzymes: No results for input(s): CKTOTAL, CKMB, CKMBINDEX, TROPONINI in the last 168 hours.  BNP: BNP (last 3 results) Recent Labs    07/22/18 1756 08/08/18 0057  BNP 1,149.2* 810.5*    ProBNP (last 3 results) No results for input(s): PROBNP in the last 8760 hours.    Other results:  Imaging: No results found.   Medications:  Scheduled Medications: . amitriptyline  50 mg Oral QHS  . cyclobenzaprine  10 mg Oral TID  . digoxin  0.125 mg Oral Daily  . docusate sodium  100 mg Oral BID  . furosemide  80 mg Intravenous BID  . gabapentin  600 mg Oral QID  . insulin aspart  0-15 Units Subcutaneous TID WC  . ipratropium-albuterol  3 mL Nebulization TID  . losartan  25 mg Oral BID  . mometasone-formoterol  2 puff Inhalation BID  . multivitamin with minerals  1 tablet Oral Daily  . oxyCODONE  10 mg Oral Q12H  . potassium chloride  40 mEq Oral BID  . rivaroxaban  20 mg Oral Q supper  . rosuvastatin  10 mg Oral QHS  . sodium chloride flush  10-40 mL Intracatheter Q12H  . sodium chloride flush  3 mL Intravenous Q12H  . spironolactone  12.5  mg Oral Daily  . varenicline  1 mg Oral BID WC    Infusions: . sodium chloride    . sodium chloride    . milrinone 0.25 mcg/kg/min (08/12/18 2331)    PRN Medications: sodium chloride, sodium chloride, acetaminophen, ipratropium-albuterol, ondansetron (ZOFRAN) IV, oxyCODONE-acetaminophen, polyethylene glycol, sodium chloride flush, sodium chloride flush   Assessment:   Anna Rowe is a 42 y.o. female with a history of hyperlipidemia, DM, COPD, sleep apnea noncompliant with CPAP, chronic DVT (diagnosed 2018) on Xarelto, tobacco use, right knee replacement 2015, and COPD.  She presented to [facility] 08/08/18 with worsening LE edema. Echo showed newly reduced EF 15-20%. [facility] 12/27 showed marked volume overload and low output and minimal nonobstructive CAD.   Plan/Discussion:    1. Acute systolic HF due to NICM. Unclear etiology. ?viral illness in October vs CPAP. Also noncompliant with CPAP. A1C only 7.2. HIV negative. Has HTN, but seems well controlled. - Echo 12/25: EF 15-20%, severe diffuse HK and septal dyskinesis, grade 2 DD, mod MR, LA moderately dilated, RV moderately dilated with moderately reduced function, PA peak pressure 35 mm Hg - [facility] 12/27 showed marked volume overload with low output, severe NICM with EF 15%, and minimal non-obstructive CAD. - Milrinone started 12/27. Co-ox 61% this morning. Continue milrinone 0.25 mcg/kg/min. Can likely decrease today. Will discuss with MD.  - Volume status improved. Creatinine 1.19 > 1.51. CVP ~6. Hold lasix today. Can probably start torsemide tomorrow if creatinine improves. She was taking lasix 40 mg BID PTA. - Hold BB with low output - Losartan stopped yesterday, unsure why.  - Continue digoxin 0.125 mg daily - Continue spiro 12.5 mg daily. Hold off on increasing today with AKI. - She is on Depo injections for birth control. Pregnancy test negative on admission.  - CR consulted  2. Minimal nonobstructive CAD -  LHC 12/27: 30% Prox LAD - No s/s ischemia. Continue crestor. No ASA with Xarelto use  3. Chronic DVT - Continue Xarelto 20 mg daily. No bleeding.   4. OSA - Has CPAP at home, but has been noncompliant because she thought OSA had resolved. Last sleep study 6-7 years ago. She has a repeat sleep study scheduled for 1/21 and they are discussing need for BIPAP [facility]) - Wore CPAP last night, but took off in her sleep. Several desat alarms around 5:30 am.   5. COPD - Current smoker 1/2 ppd. On Chantix. Encouraged cessation.  6. NSVT - Keep K >4.0, Mag > 2.0. K 4.6, mag 2.3 today  7. AKI - Creatinine 1.19 > 1.51. CVP ~6. Hold  diuresis today.   Length of Stay: 4   Georgiana Shore MD 08/13/2018, 7:37 AM  Advanced Heart Failure Team Pager [phone] (M-F; 7a - 4p)  Please contact [facility] for night-coverage after hours (4p -7a ) and weekends on [email or web address]  Patient seen with NP, agree with the above note.   She is feeling better, breathing is easier.  Weight is down again with good diuresis.  CVP 6, co-ox 61%.  Creatinine up to 1.5.   On exam, thick neck with no JVD.  Legs wrapped, now with trace ankle edema.  Regular S1S2, clear lungs.   CVP 6 and creatinine up, volume looks better on exam.  - Will hold diuretics today, hopefully start torsemide in am if creatinine stabilizes.   - Continue spironolactone and digoxin, losartan stopped yesterday for unclear reasons.  Will not restart losartan today with rise in creatinine, hopefully restart low dose in am.  - Decrease milrinone to 0.125 today, hopefully off tomorrow.   Discussed importance of smoking cessation.   She remains on Xarelto with DVT history.   Anticipate home on Wednesday most likely.   Loralie Champagne 08/13/2018 9:13 AM

## 2018-08-13 NOTE — Progress Notes (Signed)
Patient places self on CPAP. Pt wears 10 cmH2O.

## 2018-08-13 NOTE — Progress Notes (Signed)
CARDIAC REHAB PHASE I   PRE:  Rate/Rhythm: 103 ST  BP:  Supine: 97/62  Sitting:   Standing:    SaO2: 93%RA  MODE:  Ambulation: 16 ft outside of room   POST:  Rate/Rhythm: 115 ST  BP:  Supine:   Sitting: 97/62  Standing:    SaO2: 92%RA 0935-1040 Pt walked 16 ft outside of room with rolling walker. Pt stated that her right leg bothers her if she does too much activity. Heart rate to 115. Gave pt CHF booklet and low sodium diets. Discussed daily weights and importance of weighing daily. Discussed signs/symptoms of when to call MD. Discussed 2000 mg sodium restrictions. Discussed CRP 2 and referred to [facility]. Pt to consider. Discussed smoking cessation and pt has handout. She stated she has received coaching calls before. On Chantix now.  To sitting on side of bed after walk. Waiting for breakfast.   Graylon Good, RN BSN  08/13/2018 10:44 AM

## 2018-08-13 NOTE — Progress Notes (Signed)
PROGRESS NOTE  Anna Rowe NUU:[id number] DOB: 08-02-1976 DOA: 08/08/2018 [facility]CP: [facility]  HPI/Recap of past 24 hours: Anna Beckers Quick-Hopeis a 42 y.o.femalewith medical history significant ofDM2, prior RLE DVT on chronic Xarelto now, ? CHF though no formal diagnosis her PCP put her on Lasix a few months back for leg swelling.  Patient presents to the ED with several week history of worsening leg swelling, especially worse over past 3-4 dayswithorthopnea and DOE. She was admitted for evaluation of CHF.   08/12/2018: 6 beats of nonsustained V. tach reported.  Asymptomatic.  08/13/2018: Patient seen and examined at bedside.  No acute events overnight.  Denies chest pain, palpitations or dyspnea.  States her breathing is improving.  Assessment/Plan: Principal Problem:   Acute diastolic CHF (congestive heart failure) (HCC) Active Problems:   Chronic bilateral low back pain   DM2 (diabetes mellitus, type 2) (HCC)   History of DVT (deep vein thrombosis)   CHF (congestive heart failure) (HCC)  Acute on chronic systolic and diastolic heart failure:  Abnormal echocardiogram showing severe global hypokinesis with LVEF 15 to 20% on 08/08/2018.  Lasix and losartan being held today due to AKI -23.3 L since admission Cardiac medications per heart failure team Post left and right heart cath Cardiac rehab phase 1 in progress  AKI Baseline creatinine appears to be 0.9 with GFR greater than 60 Creatinine 1.51 today from 1.19 yesterday Lasix and losartan being held due to AKI Monitor urine output Avoid nephrotoxic agents and hypotension Repeat BMP in the morning  Nonsustained V. tach 6 beats of nonsustained V. tach reported 08/12/2018 Cardiology made aware Medications as recommended by cardiology  Tobacco abuse: Tobacco cessation counseling at bedside Currently on Chantix   Hyperlipidemia:  Lipid panel reviewed , currently on 10 mg of  crestor.   H/o COPD:  Continue duo nebs and Dulera  Chronic pain syndrome:  Resume her home meds.  H/o of DVT; Repeat US of the extremities does not show any new DVT.  On xarelto , continue the same.   OSA CPAP at night   Type 2 Diabetes Mellitus with hyperglycemia: CBG'S appear to be better controlled.  CBG (last 3)  RecentLabs(last2labs)  Recent Labs    08/08/18 2059 08/09/18 0734 08/09/18 1142  GLUCAP 103* 121* 169*     Continue SSI.  Hemoglobin A1c 7.2 on 08/09/2018.   Ambulatory dysfunction/physical debility She has chronic knee pain PT to assess Fall precautions in place   DVT prophylaxis: Xarelto.  Code Status: Full code.  Family Communication: multiple family members at bedside.  Disposition Plan: pending cardiology recommendations.     Objective: Vitals:   08/13/18 0900 08/13/18 0901 08/13/18 0942 08/13/18 1401  BP: 92/64     Pulse: (!) 51  (!) 106   Resp: (!) 22     Temp:      TempSrc:      SpO2:  93%  95%  Weight:      Height:        Intake/Output Summary (Last 24 hours) at 08/13/2018 1416 Last data filed at 08/13/2018 1200 Gross per 24 hour  Intake 880 ml  Output 5050 ml  Net -4170 ml   Filed Weights   08/11/18 0550 08/12/18 0500 08/13/18 0333  Weight: (!) 142.9 kg (!) 140.3 kg (!) 137.8 kg    Exam:  . General: 42 y.o. year-old female well-developed well-nourished in no acute distress.  Alert and oriented x3.   . Cardiovascular: Regular rate and  rhythm with no rubs or gallops.  No JVD or thyromegaly noted.  Respiratory: Clear to auscultation with no wheezes or rales.  Good inspiratory effort. . Abdomen: Soft nontender nondistended with normal bowel sounds x4 quadrants. . Musculoskeletal: Bilateral lower extremities wrapped in compression stockings. . Psychiatry: Mood is appropriate for condition and setting   Data Reviewed: CBC: Recent Labs  Lab 08/08/18 0057 08/10/18 0552  WBC 8.9 7.7  NEUTROABS 3.9  --     HGB 13.9 13.6  HCT 45.9 43.3  MCV 70.5* 70.1*  PLT 397 371*   Basic Metabolic Panel: Recent Labs  Lab 08/09/18 0354 08/10/18 0552 08/11/18 0524 08/12/18 0414 08/13/18 0323  NA 139 139 138 137 133*  K 4.0 3.9 3.7 3.9 4.6  CL 101 100 99 97* 93*  CO2 24 28 30 29 30   GLUCOSE 115* 140* 146* 147* 133*  BUN 16 16 12 19  24*  CREATININE 1.11* 1.14* 0.97 1.19* 1.51*  CALCIUM 8.9 8.7* 8.4* 9.0 9.3  MG  --  2.2 2.1 2.4 2.3   GFR: Estimated Creatinine Clearance: 75.9 mL/min (A) (by C-G formula based on SCr of 1.51 mg/dL (H)). Liver Function Tests: No results for input(s): AST, ALT, ALKPHOS, BILITOT, PROT, ALBUMIN in the last 168 hours. No results for input(s): LIPASE, AMYLASE in the last 168 hours. No results for input(s): AMMONIA in the last 168 hours. Coagulation Profile: No results for input(s): INR, PROTIME in the last 168 hours. Cardiac Enzymes: No results for input(s): CKTOTAL, CKMB, CKMBINDEX, TROPONINI in the last 168 hours. BNP (last 3 results) No results for input(s): PROBNP in the last 8760 hours. HbA1C: No results for input(s): HGBA1C in the last 72 hours. CBG: Recent Labs  Lab 08/12/18 1120 08/12/18 1646 08/12/18 2143 08/13/18 0626 08/13/18 1124  GLUCAP 105* 114* 143* 148* 153*   Lipid Profile: No results for input(s): CHOL, HDL, LDLCALC, TRIG, CHOLHDL, LDLDIRECT in the last 72 hours. Thyroid Function Tests: Recent Labs    08/11/18 0524  TSH 3.444   Anemia Panel: No results for input(s): VITAMINB12, FOLATE, FERRITIN, TIBC, IRON, RETICCTPCT in the last 72 hours. Urine analysis:    Component Value Date/Time   COLORURINE YELLOW 07/22/2018 1729   APPEARANCEUR CLEAR 07/22/2018 1729   LABSPEC 1.015 07/22/2018 1729   PHURINE 6.0 07/22/2018 1729   GLUCOSEU NEGATIVE 07/22/2018 1729   HGBUR MODERATE (A) 07/22/2018 1729   BILIRUBINUR NEGATIVE 07/22/2018 1729   KETONESUR NEGATIVE 07/22/2018 1729   PROTEINUR NEGATIVE 07/22/2018 1729   UROBILINOGEN 1.0  12/13/2012 0230   NITRITE NEGATIVE 07/22/2018 1729   LEUKOCYTESUR TRACE (A) 07/22/2018 1729   Sepsis Labs: @LABRCNTIP (procalcitonin:4,lacticidven:4)  )No results found for this or any previous visit (from the past 240 hour(s)).    Studies: No results found.  Scheduled Meds: . amitriptyline  50 mg Oral QHS  . cyclobenzaprine  10 mg Oral TID  . digoxin  0.125 mg Oral Daily  . docusate sodium  100 mg Oral BID  . gabapentin  600 mg Oral QID  . insulin aspart  0-15 Units Subcutaneous TID WC  . ipratropium-albuterol  3 mL Nebulization TID  . mometasone-formoterol  2 puff Inhalation BID  . multivitamin with minerals  1 tablet Oral Daily  . oxyCODONE  10 mg Oral Q12H  . rivaroxaban  20 mg Oral Q supper  . rosuvastatin  10 mg Oral QHS  . sodium chloride flush  10-40 mL Intracatheter Q12H  . sodium chloride flush  3 mL Intravenous Q12H  .  spironolactone  12.5 mg Oral Daily  . varenicline  1 mg Oral BID WC    Continuous Infusions: . sodium chloride    . sodium chloride    . milrinone 0.125 mcg/kg/min (08/13/18 0946)     LOS: 4 days     Kayleen Memos, MD [facility] Pager [phone]  If 7PM-7AM, please contact night-coverage [email or web address] Password [email or web address] 08/13/2018, 2:16 PM

## 2018-08-14 LAB — GLUCOSE, CAPILLARY
Glucose-Capillary: 118 mg/dL — ABNORMAL HIGH (ref 70–99)
Glucose-Capillary: 132 mg/dL — ABNORMAL HIGH (ref 70–99)
Glucose-Capillary: 115 mg/dL — ABNORMAL HIGH (ref 70–99)
Glucose-Capillary: 103 mg/dL — ABNORMAL HIGH (ref 70–99)

## 2018-08-14 LAB — BASIC METABOLIC PANEL
Anion gap: 8 (ref 5–15)
BUN: 23 mg/dL — ABNORMAL HIGH (ref 6–20)
CO2: 27 mmol/L (ref 22–32)
Calcium: 9 mg/dL (ref 8.9–10.3)
Chloride: 102 mmol/L (ref 98–111)
Creatinine, Ser: 1 mg/dL (ref 0.44–1.00)
GFR calc Af Amer: >60 mL/min (ref >60)
GFR calc non Af Amer: >60 mL/min (ref >60)
Glucose, Bld: 143 mg/dL — ABNORMAL HIGH (ref 70–99)
Potassium: 3.9 mmol/L (ref 3.5–5.1)
Sodium: 137 mmol/L (ref 135–145)

## 2018-08-14 LAB — DIGOXIN LEVEL: Digoxin Level: 0.2 ng/mL — ABNORMAL LOW (ref 0.8–2.0)

## 2018-08-14 LAB — COOXEMETRY PANEL
Carboxyhemoglobin: 1.5 % (ref 0.5–1.5)
Methemoglobin: 1 % (ref 0.0–1.5)
O2 Saturation: 68.5 %
Total hemoglobin: 14.6 g/dL (ref 12.0–16.0)

## 2018-08-14 LAB — MAGNESIUM: Magnesium: 2.3 mg/dL (ref 1.7–2.4)

## 2018-08-14 MED ORDER — POLYETHYLENE GLYCOL 3350 17 G PO PACK
17.0000 g | PACK | Freq: Every day | ORAL | Status: DC
  Filled 2018-08-14: qty 1

## 2018-08-14 MED ORDER — LOSARTAN POTASSIUM 25 MG PO TABS
12.5000 mg | ORAL_TABLET | Freq: Every day | ORAL | Status: DC
  Filled 2018-08-14: qty 1

## 2018-08-14 MED ORDER — TORSEMIDE 20 MG PO TABS
40.0000 mg | ORAL_TABLET | Freq: Every day | ORAL | Status: DC
  Administered 2018-08-14 – 2018-08-15 (×2): 40 mg via ORAL
  Filled 2018-08-14 (×2): qty 2

## 2018-08-14 MED ORDER — LOSARTAN POTASSIUM 25 MG PO TABS
25.0000 mg | ORAL_TABLET | Freq: Every day | ORAL | Status: DC
  Administered 2018-08-14 – 2018-08-15 (×2): 25 mg via ORAL
  Filled 2018-08-14: qty 1

## 2018-08-14 MED ORDER — SENNOSIDES-DOCUSATE SODIUM 8.6-50 MG PO TABS
2.0000 | ORAL_TABLET | Freq: Two times a day (BID) | ORAL | Status: DC
  Administered 2018-08-14 – 2018-08-15 (×3): 2 via ORAL
  Filled 2018-08-14 (×3): qty 2

## 2018-08-14 NOTE — Plan of Care (Signed)

## 2018-08-14 NOTE — Progress Notes (Signed)
CARDIAC REHAB PHASE I   PRE:  Rate/Rhythm: 112 ST  BP:  Supine:   Sitting: 111/62  Standing:    SaO2: 96%RA  MODE:  Ambulation: 60 ft   POST:  Rate/Rhythm: 115 ST couple of PVCs  BP:  Supine:   Sitting: 125/91  Standing:    SaO2: 97%RA 1002-1057 Waited for pt to wash up to walk. Pt walked 60 ft on RA with her cane with steady gait. To recliner with call bell. Tolerated well.    Graylon Good, RN BSN  08/14/2018 10:54 AM

## 2018-08-14 NOTE — Progress Notes (Signed)
PROGRESS NOTE  Anna Rowe XHB:[id number] DOB: 1976/07/13 DOA: 08/08/2018 [facility]: [facility]  HPI/Recap of past 24 hours: Anna Rowe Quick-Hopeis a 42 y.o.femalewith medical history significant ofDM2, prior RLE DVT on chronic Xarelto now, ? CHF though no formal diagnosis her PCP put her on Lasix a few months back for leg swelling.  Patient presents to the ED with several week history of worsening leg swelling, especially worse over past 3-4 dayswithorthopnea and DOE. She was admitted for evaluation of CHF.    08/14/2018: Patient seen and examined at bedside.  No acute events overnight.  She denies chest pain, palpitations or dyspnea.  Continues cardiac rehab.  Reports feeling constipated no bowel movements in 3 days.  Assessment/Plan: Principal Problem:   Acute diastolic CHF (congestive heart failure) (HCC) Active Problems:   Chronic bilateral low back pain   DM2 (diabetes mellitus, type 2) (HCC)   History of DVT (deep vein thrombosis)   CHF (congestive heart failure) (HCC)  Acute on chronic systolic and diastolic heart failure:  Abnormal echocardiogram showing severe global hypokinesis with LVEF 15 to 20% on 08/08/2018.  Lasix and losartan being held today due to AKI Net -29.6 L since admission Cardiac medications per heart failure team Post left and right heart cath Cardiac rehab phase 1 in progress  Resolved AKI Baseline creatinine appears to be 0.9 with GFR greater than 60 Creatinine 1.0 from 1.51 today from 1.19 yesterday Losartan resumed by cardiology Repeat BMP in the morning  Nonsustained V. Tach, no recurrence  Chronic constipation Start bowel regimen Senokot 2 tablet twice daily and MiraLAX daily  Tobacco abuse: Tobacco cessation counseling at bedside Currently on Chantix   Hyperlipidemia:  Lipid panel reviewed , currently on 10 mg of crestor.   H/o COPD:  Continue duo nebs and Dulera  Chronic pain  syndrome:  Resume her home meds.  H/o of DVT; Repeat US of the extremities does not show any new DVT.  On xarelto , continue the same.   OSA CPAP at night   Type 2 Diabetes Mellitus with hyperglycemia: CBG'S appear to be better controlled.  CBG (last 3)  RecentLabs(last2labs)  Recent Labs    08/08/18 2059 08/09/18 0734 08/09/18 1142  GLUCAP 103* 121* 169*     Continue SSI.  Hemoglobin A1c 7.2 on 08/09/2018.   Ambulatory dysfunction/physical debility She has chronic knee pain PT to assess Fall precautions in place   DVT prophylaxis: Xarelto.  Code Status: Full code.  Family Communication: multiple family members at bedside.  Disposition Plan: pending cardiology recommendations.     Objective: Vitals:   08/14/18 0833 08/14/18 0900 08/14/18 1030 08/14/18 1100  BP: 122/73     Pulse: (!) 103 (!) 50 (!) 112   Resp: 13 (!) 24  (!) 26  Temp:      TempSrc:      SpO2: 97% 97%    Weight:      Height:        Intake/Output Summary (Last 24 hours) at 08/14/2018 1442 Last data filed at 08/14/2018 1438 Gross per 24 hour  Intake 753 ml  Output 6800 ml  Net -6047 ml   Filed Weights   08/12/18 0500 08/13/18 0333 08/14/18 0322  Weight: (!) 140.3 kg (!) 137.8 kg (!) 138.2 kg    Exam:  . General: 42 y.o. year-old female developed well-nourished in no acute distress.  Alert and oriented x3.  Cardiovascular: Regular Rate and Rhythm with No Rubs or Gallops.  No JVD  or Thyromegaly Noted.  Respiratory: Clear to auscultation with no wheezes or rales.  Good inspiratory effort. . Abdomen: Soft nontender nondistended with normal bowel sounds x4 quadrants. . Musculoskeletal: Bilateral lower extremities wrapped in compression stockings. . Psychiatry: Mood is appropriate for condition and setting   Data Reviewed: CBC: Recent Labs  Lab 08/08/18 0057 08/10/18 0552  WBC 8.9 7.7  NEUTROABS 3.9  --   HGB 13.9 13.6  HCT 45.9 43.3  MCV 70.5* 70.1*  PLT 397 408*    Basic Metabolic Panel: Recent Labs  Lab 08/10/18 0552 08/11/18 0524 08/12/18 0414 08/13/18 0323 08/14/18 0506  NA 139 138 137 133* 137  K 3.9 3.7 3.9 4.6 3.9  CL 100 99 97* 93* 102  CO2 28 30 29 30 27   GLUCOSE 140* 146* 147* 133* 143*  BUN 16 12 19  24* 23*  CREATININE 1.14* 0.97 1.19* 1.51* 1.00  CALCIUM 8.7* 8.4* 9.0 9.3 9.0  MG 2.2 2.1 2.4 2.3 2.3   GFR: Estimated Creatinine Clearance: 114.7 mL/min (by C-G formula based on SCr of 1 mg/dL). Liver Function Tests: No results for input(s): AST, ALT, ALKPHOS, BILITOT, PROT, ALBUMIN in the last 168 hours. No results for input(s): LIPASE, AMYLASE in the last 168 hours. No results for input(s): AMMONIA in the last 168 hours. Coagulation Profile: No results for input(s): INR, PROTIME in the last 168 hours. Cardiac Enzymes: No results for input(s): CKTOTAL, CKMB, CKMBINDEX, TROPONINI in the last 168 hours. BNP (last 3 results) No results for input(s): PROBNP in the last 8760 hours. HbA1C: No results for input(s): HGBA1C in the last 72 hours. CBG: Recent Labs  Lab 08/13/18 1124 08/13/18 1650 08/13/18 2034 08/14/18 0626 08/14/18 1118  GLUCAP 153* 142* 138* 118* 132*   Lipid Profile: No results for input(s): CHOL, HDL, LDLCALC, TRIG, CHOLHDL, LDLDIRECT in the last 72 hours. Thyroid Function Tests: No results for input(s): TSH, T4TOTAL, FREET4, T3FREE, THYROIDAB in the last 72 hours. Anemia Panel: No results for input(s): VITAMINB12, FOLATE, FERRITIN, TIBC, IRON, RETICCTPCT in the last 72 hours. Urine analysis:    Component Value Date/Time   COLORURINE YELLOW 07/22/2018 1729   APPEARANCEUR CLEAR 07/22/2018 1729   LABSPEC 1.015 07/22/2018 1729   PHURINE 6.0 07/22/2018 1729   GLUCOSEU NEGATIVE 07/22/2018 1729   HGBUR MODERATE (A) 07/22/2018 1729   BILIRUBINUR NEGATIVE 07/22/2018 1729   KETONESUR NEGATIVE 07/22/2018 1729   PROTEINUR NEGATIVE 07/22/2018 1729   UROBILINOGEN 1.0 12/13/2012 0230   NITRITE NEGATIVE  07/22/2018 1729   LEUKOCYTESUR TRACE (A) 07/22/2018 1729   Sepsis Labs: @LABRCNTIP (procalcitonin:4,lacticidven:4)  )No results found for this or any previous visit (from the past 240 hour(s)).    Studies: No results found.  Scheduled Meds: . amitriptyline  50 mg Oral QHS  . cyclobenzaprine  10 mg Oral TID  . digoxin  0.125 mg Oral Daily  . docusate sodium  100 mg Oral BID  . gabapentin  600 mg Oral QID  . insulin aspart  0-15 Units Subcutaneous TID WC  . ipratropium-albuterol  3 mL Nebulization TID  . losartan  25 mg Oral Daily  . mometasone-formoterol  2 puff Inhalation BID  . multivitamin with minerals  1 tablet Oral Daily  . oxyCODONE  10 mg Oral Q12H  . rivaroxaban  20 mg Oral Q supper  . rosuvastatin  10 mg Oral QHS  . sodium chloride flush  10-40 mL Intracatheter Q12H  . sodium chloride flush  3 mL Intravenous Q12H  . spironolactone  12.5 mg  Oral Daily  . torsemide  40 mg Oral Daily  . varenicline  1 mg Oral BID WC    Continuous Infusions: . sodium chloride    . sodium chloride       LOS: 5 days     Kayleen Memos, MD [facility] Pager [phone]  If 7PM-7AM, please contact night-coverage [email or web address] Password [email or web address] 08/14/2018, 2:42 PM

## 2018-08-14 NOTE — Progress Notes (Addendum)
Advanced Heart Failure Rounding Note   Subjective:    Milrinone decreased to 0.125 yesterday. Coox 68%. Diuretics held with creatinine bump to 1.5. Creatinine 1.0 today. Weight up 1 lb. CVP 7-10 with lots of resp variation. SBP 100-120s  21 beats NSVT yesterday. K 3.9, Mag 2.3  Denies CP, SOB, or dizziness. Walked with PT yesterday with no problems.   Cath 12/27 Findings: Ao = 99/75 (81) LV = 93/31 RA = 22 RV = 54/25 PA = 58/24 (41) PCW = 30 Fick cardiac output/index = 4.1/1.6 SVR = 1163 PVR = 2.6 FA sat = 89% PA sat = 42%, 44% Assessment: 1. 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 Severe NICM with LVEF ~ 15% 3. Marked volume overload with low output   Objective:   Weight Range:  Vital Signs:   Temp:  [97.7 F (36.5 C)-98.3 F (36.8 C)] 98.3 F (36.8 C) (12/31 0322) Pulse Rate:  [48-106] 106 (12/31 0322) Resp:  [17-28] 20 (12/31 0322) BP: (92-116)/(64-76) 116/76 (12/31 0322) SpO2:  [90 %-98 %] 98 % (12/30 2209) Weight:  [138.2 kg] 138.2 kg (12/31 0322) Last BM Date: 08/12/18  Weight change: Filed Weights   08/12/18 0500 08/13/18 0333 08/14/18 0322  Weight: (!) 140.3 kg (!) 137.8 kg (!) 138.2 kg    Intake/Output:   Intake/Output Summary (Last 24 hours) at 08/14/2018 0827 Last data filed at 08/14/2018 0700 Gross per 24 hour  Intake 283 ml  Output 4700 ml  Net -4417 ml     Physical Exam: General: Lying in bed. No resp difficulty. HEENT: Normal Neck: Supple. JVP difficult. Carotids 2+ bilat; no bruits. No thyromegaly or nodule noted. Cor: PMI laterally displaced. Tachy, regular, +s3. Distant heart sounds.  Lungs: CTAB, normal effort. Abdomen: Soft, non-tender, non-distended, no HSM. No bruits or masses. +BS  Extremities: No cyanosis, clubbing, or rash. R and LLE no edema. BLE UNNA boots. LUE with PICC line.  Neuro: Alert & orientedx3, cranial nerves grossly intact. moves all 4 extremities w/o difficulty. Affect pleasant  Telemetry: Sinus  tach 100s. 21 beats fast NSVT. Personally reviewed.    Labs: Basic Metabolic Panel: Recent Labs  Lab 08/10/18 0552 08/11/18 0524 08/12/18 0414 08/13/18 0323 08/14/18 0506  NA 139 138 137 133* 137  K 3.9 3.7 3.9 4.6 3.9  CL 100 99 97* 93* 102  CO2 28 30 29 30 27   GLUCOSE 140* 146* 147* 133* 143*  BUN 16 12 19  24* 23*  CREATININE 1.14* 0.97 1.19* 1.51* 1.00  CALCIUM 8.7* 8.4* 9.0 9.3 9.0  MG 2.2 2.1 2.4 2.3 2.3    Liver Function Tests: No results for input(s): AST, ALT, ALKPHOS, BILITOT, PROT, ALBUMIN in the last 168 hours. No results for input(s): LIPASE, AMYLASE in the last 168 hours. No results for input(s): AMMONIA in the last 168 hours.  CBC: Recent Labs  Lab 08/08/18 0057 08/10/18 0552  WBC 8.9 7.7  NEUTROABS 3.9  --   HGB 13.9 13.6  HCT 45.9 43.3  MCV 70.5* 70.1*  PLT 397 408*    Cardiac Enzymes: No results for input(s): CKTOTAL, CKMB, CKMBINDEX, TROPONINI in the last 168 hours.  BNP: BNP (last 3 results) Recent Labs    07/22/18 1756 08/08/18 0057  BNP 1,149.2* 810.5*    ProBNP (last 3 results) No results for input(s): PROBNP in the last 8760 hours.    Other results:  Imaging: No results found.   Medications:     Scheduled Medications: . amitriptyline  50 mg  Oral QHS  . cyclobenzaprine  10 mg Oral TID  . digoxin  0.125 mg Oral Daily  . docusate sodium  100 mg Oral BID  . gabapentin  600 mg Oral QID  . insulin aspart  0-15 Units Subcutaneous TID WC  . ipratropium-albuterol  3 mL Nebulization TID  . mometasone-formoterol  2 puff Inhalation BID  . multivitamin with minerals  1 tablet Oral Daily  . oxyCODONE  10 mg Oral Q12H  . rivaroxaban  20 mg Oral Q supper  . rosuvastatin  10 mg Oral QHS  . sodium chloride flush  10-40 mL Intracatheter Q12H  . sodium chloride flush  3 mL Intravenous Q12H  . spironolactone  12.5 mg Oral Daily  . varenicline  1 mg Oral BID WC    Infusions: . sodium chloride    . sodium chloride    . milrinone  0.125 mcg/kg/min (08/14/18 0634)    PRN Medications: sodium chloride, sodium chloride, acetaminophen, ipratropium-albuterol, ondansetron (ZOFRAN) IV, oxyCODONE-acetaminophen, polyethylene glycol, sodium chloride flush, sodium chloride flush   Assessment:   Anna Rowe is a 42 y.o. female with a history of hyperlipidemia, DM, COPD, sleep apnea noncompliant with CPAP, chronic DVT (diagnosed 2018) on Xarelto, tobacco use, right knee replacement 2015, and COPD.  She presented to [facility] 08/08/18 with worsening LE edema. Echo showed newly reduced EF 15-20%. [facility] 12/27 showed marked volume overload and low output and minimal nonobstructive CAD.   Plan/Discussion:    1. Acute systolic HF due to NICM. Unclear etiology. ?viral illness in October vs CPAP. Also noncompliant with CPAP. A1C only 7.2. HIV negative. Has HTN, but seems well controlled. - Echo 12/25: EF 15-20%, severe diffuse HK and septal dyskinesis, grade 2 DD, mod MR, LA moderately dilated, RV moderately dilated with moderately reduced function, PA peak pressure 35 mm Hg - [facility] 12/27 showed marked volume overload with low output, severe NICM with EF 15%, and minimal non-obstructive CAD. - Milrinone started 12/27. Decreased to 0.125 yesterday. Co-ox 68%. Stop milrinone.  - Volume status stable. CVP 7-10 with a lot of respiratory variation. Creatinine 1.19 > 1.51 > 1.0 today. Start torsemide 40 mg daily. She was taking lasix 40 mg BID at home.  - Hold BB with low output - Restart losartan 12.5 mg daily.  SBP 100-120s - Continue digoxin 0.125 mg daily. Dig level 0.2 12/31 - Continue spiro 12.5 mg daily.  - She is on Depo injections for birth control. Pregnancy test negative on admission.  - CR consulted  2. Minimal nonobstructive CAD - [facility] 12/27: 30% Prox LAD - No s/s ischemia. Continue crestor. No ASA with Xarelto use  3. Chronic DVT - Continue Xarelto 20 mg daily. Denies bleeding.   4. OSA - Has CPAP at  home, but has been noncompliant because she thought OSA had resolved. Last sleep study 6-7 years ago. She has a repeat sleep study scheduled for 1/21 and they are discussing need for BIPAP [facility]) - Wearing CPAP here.   5. COPD - Current smoker 1/2 ppd. On Chantix. Encouraged cessation. No change.   6. NSVT - Keep K >4.0, Mag > 2.0. K 3.9, mag 2.3  7. AKI - Creatinine 1.19 > 1.51 > 1.0. Diuretics held yesterday. Resolved.  Length of Stay: 5  Hopefully can DC tomorrow if she does well off milrinone and renal function stays stable. Follow up scheduled.   Georgiana Shore, NP 08/14/2018, 8:27 AM  Advanced Heart Failure Team Pager [phone] (M-F; 7a -  4p)  Please contact [facility] for night-coverage after hours (4p -7a ) and weekends on [email or web address]  Patient seen with NP, agree with the above note.   She is feeling better, breathing is easier. No diuretic yesterday with creatinine up to 1.5, now back down to 1.0.  CVP 7-10 range with respiratory variation, co-ox 68% on milrinone 0.125.   On exam, thick neck with no JVD.  Trace ankle edema.  Regular S1S2, clear lungs.   Volume ok by exam, CVP 7-10 with respiratory variation.  Good co-ox.  - Stop milrinone today.  - Start torsemide 40 mg daily.  - Continue spironolactone and digoxin.  - Start losartan 25 mg daily with improved creatinine and BP.  Will not use Entresto yet as BP has been soft at times.   Short NSVT run noted. Milrinone to be stopped today.  If she has NSVT off milrinone, would consider Lifevest. If BP stable tomorrow, may be able to start low dose Coreg.     Discussed importance of smoking cessation.   She remains on Xarelto with DVT history.   Anticipate home on Wednesday most likely.   Loralie Champagne 08/14/2018 10:21 AM

## 2018-08-15 LAB — CBC
HCT: 46.6 % — ABNORMAL HIGH (ref 36.0–46.0)
Hemoglobin: 14.6 g/dL (ref 12.0–15.0)
MCH: 21.8 pg — ABNORMAL LOW (ref 26.0–34.0)
MCHC: 31.3 g/dL (ref 30.0–36.0)
MCV: 69.6 fL — ABNORMAL LOW (ref 80.0–100.0)
Platelets: 324 10*3/uL (ref 150–400)
RBC: 6.7 MIL/uL — ABNORMAL HIGH (ref 3.87–5.11)
RDW: 17.1 % — ABNORMAL HIGH (ref 11.5–15.5)
WBC: 6.3 10*3/uL (ref 4.0–10.5)
nRBC: 0 % (ref 0.0–0.2)

## 2018-08-15 LAB — BASIC METABOLIC PANEL
Anion gap: 10 (ref 5–15)
BUN: 29 mg/dL — ABNORMAL HIGH (ref 6–20)
CO2: 28 mmol/L (ref 22–32)
Calcium: 9.1 mg/dL (ref 8.9–10.3)
Chloride: 99 mmol/L (ref 98–111)
Creatinine, Ser: 0.98 mg/dL (ref 0.44–1.00)
GFR calc Af Amer: >60 mL/min (ref >60)
GFR calc non Af Amer: >60 mL/min (ref >60)
Glucose, Bld: 142 mg/dL — ABNORMAL HIGH (ref 70–99)
Potassium: 4 mmol/L (ref 3.5–5.1)
Sodium: 137 mmol/L (ref 135–145)

## 2018-08-15 LAB — COOXEMETRY PANEL
Carboxyhemoglobin: 1.1 % (ref 0.5–1.5)
Methemoglobin: 1.4 % (ref 0.0–1.5)
O2 Saturation: 61.6 %
Total hemoglobin: 15 g/dL (ref 12.0–16.0)

## 2018-08-15 LAB — MAGNESIUM: Magnesium: 2.4 mg/dL (ref 1.7–2.4)

## 2018-08-15 LAB — GLUCOSE, CAPILLARY
Glucose-Capillary: 176 mg/dL — ABNORMAL HIGH (ref 70–99)
Glucose-Capillary: 131 mg/dL — ABNORMAL HIGH (ref 70–99)

## 2018-08-15 MED ORDER — IPRATROPIUM-ALBUTEROL 0.5-2.5 (3) MG/3ML IN SOLN: 3.0000 mL | Freq: Two times a day (BID) | RESPIRATORY_TRACT | Status: DC

## 2018-08-15 MED ORDER — CARVEDILOL 3.125 MG PO TABS
3.1250 mg | ORAL_TABLET | Freq: Two times a day (BID) | ORAL | Status: DC
  Administered 2018-08-15: 3.125 mg via ORAL
  Filled 2018-08-15: qty 1

## 2018-08-15 MED ORDER — CHANTIX CONTINUING MONTH PAK 1 MG PO TABS: 1.0000 mg | ORAL_TABLET | Freq: Two times a day (BID) | ORAL | 0 refills | Status: DC

## 2018-08-15 MED ORDER — POTASSIUM CHLORIDE CRYS ER 20 MEQ PO TBCR: 20.0000 meq | EXTENDED_RELEASE_TABLET | Freq: Every day | ORAL | 0 refills | Status: DC

## 2018-08-15 MED ORDER — MOMETASONE FURO-FORMOTEROL FUM 200-5 MCG/ACT IN AERO: 2.0000 | INHALATION_SPRAY | Freq: Two times a day (BID) | RESPIRATORY_TRACT | 0 refills | Status: AC

## 2018-08-15 MED ORDER — SPIRONOLACTONE 25 MG PO TABS: 12.5000 mg | ORAL_TABLET | Freq: Every day | ORAL | 0 refills | Status: DC

## 2018-08-15 MED ORDER — LOSARTAN POTASSIUM 25 MG PO TABS: 25.0000 mg | ORAL_TABLET | Freq: Every day | ORAL | 0 refills | Status: DC

## 2018-08-15 MED ORDER — TORSEMIDE 20 MG PO TABS: 40.0000 mg | ORAL_TABLET | Freq: Every day | ORAL | 0 refills | Status: DC

## 2018-08-15 MED ORDER — IPRATROPIUM-ALBUTEROL 0.5-2.5 (3) MG/3ML IN SOLN: 3.0000 mL | Freq: Two times a day (BID) | RESPIRATORY_TRACT | 0 refills | Status: DC

## 2018-08-15 MED ORDER — DIGOXIN 125 MCG PO TABS: 0.1250 mg | ORAL_TABLET | Freq: Every day | ORAL | 0 refills | Status: DC

## 2018-08-15 MED ORDER — CARVEDILOL 3.125 MG PO TABS: 3.1250 mg | ORAL_TABLET | Freq: Two times a day (BID) | ORAL | 0 refills | Status: DC

## 2018-08-15 NOTE — Discharge Summary (Signed)
Discharge Summary  Anna Rowe KXF:[id number] DOB: September 11, 1975  [facility]P: [facility]  Admit date: 08/08/2018 Discharge date: 08/15/2018  Time spent: 35 minutes  Recommendations for Outpatient Follow-up:  1. Follow-up with cardiology 2. Follow-up with your PCP 3. Continue physical therapy 4. Fall precautions 5. Please follow-up with your pain medicine clinic within a week- CAUTION, on high-dose pain medications.  Patient insists on staying on the same home regimen for her chronic pain syndrome.   Cardiology recommendations: Disposition: Close followup in CHF clinic.  Meds for home: Xarelto 20 daily, spironolactone 12.5 daily, losartan 25 daily, Coreg 3.125 mg bid, digoxin 0.125 daily, torsemide 40 daily, Crestor 10 daily, KCl 20 daily, Chantix.   Discharge Diagnoses:  Active Hospital Problems   Diagnosis Date Noted  . Acute diastolic CHF (congestive heart failure) ([place]) 08/08/2018  . CHF (congestive heart failure) ([place]) 08/09/2018  . DM2 (diabetes mellitus, type 2) ([place]) 08/08/2018  . History of DVT (deep vein thrombosis) 08/08/2018  . Chronic bilateral low back pain 08/24/2017    Resolved Hospital Problems  No resolved problems to display.    Discharge Condition: Stable  Diet recommendation: Heart healthy diabetic low salt diet  Vitals:   08/15/18 0840 08/15/18 1218  BP: 127/83 (!) 128/59  Pulse: (!) 101 100  Resp: (!) 26 19  Temp:  97.8 F (36.6 C)  SpO2: 97% 100%    History of present illness:  Anna Latrelle Quick-Hopeis a 43 y.o.femalewith medical history significant ofDM2, prior RLE DVT on chronic Xarelto now, chronic bilateral lower extremity swelling on Lasix, obesity who presented to the ED [facility] with several week history of gradually worsening lower extremity edema, especially worse over past 3-4 days, associatedwithorthopnea and dyspnea on exertion. She was admitted for evaluation of CHF.   2D echo revealed LVEF of 15%.   Cardiology/advanced heart failure team consulted and followed.  Heart cath completed on 08/10/2018 and revealed minimal nonobstructive CAD with LAD 30% stenosis, severe nonischemic cardiomyopathy with LVEF around 15%.  Hospital course complicated by marked volume overload with low output, sinus tachycardia with non sustained ventricular tachycardia 6 beats.  No recurrences in the last 24 to 48 hours.  Worked with cardiac rehab and tolerated well.  08/15/2018: Patient seen and examined at her bedside.  No acute events overnight.  She has no new complaints.  She denies chest pain, dyspnea or palpitation.  On the day of discharge, the patient was hemodynamically stable.  She will need to follow-up with her cardiologist and PCP post hospitalization.  She will also need to take her medications as prescribed and continue cardiac rehab.    Hospital Course:  Principal Problem:   Acute diastolic CHF (congestive heart failure) (HCC) Active Problems:   Chronic bilateral low back pain   DM2 (diabetes mellitus, type 2) (HCC)   History of DVT (deep vein thrombosis)   CHF (congestive heart failure) (HCC)  Acute on chronic systolic and diastolic heart failure:  Abnormal echocardiogram showing severe global hypokinesis with LVEF 15 to 20% on 08/08/2018.  Net -29.8 L since admission Continue cardiac medications per heart failure team Post left and right heart cath on 08/10/2018 Cardiac rehab phase 1 in progress Continue physical therapy  Resolved AKI Baseline creatinine appears to be 0.9 with GFR greater than 60 Creatinine today 08/15/2018 is 0.98 with GFR greater than 60 Follow-up with your PCP  Nonsustained V. Tach, no recurrence  Resolved chronic constipation Bowel regimen Senokot 2 tablet twice daily and MiraLAX daily  Tobacco  abuse: Tobacco cessation counseling at bedside Currently on Chantix   Hyperlipidemia:  Lipid panel reviewed , currently on 10 mg of crestor.   H/o COPD:    Continue duo nebs and Dulera  Chronic pain syndrome:  Resume her home meds.  H/o of DVT; Repeat US of the extremities does not show any new DVT.  On xarelto , continue the same.   OSA Continue CPAP at night   Type 2 Diabetes Mellitus with hyperglycemia: CBG'S appear to be better controlled. CBG (last 3) RecentLabs(last2labs)       Recent Labs   08/08/18 2059 08/09/18 0734 08/09/18 1142  GLUCAP 103* 121* 169*     Continue SSI.  Hemoglobin A1c 7.2 on 08/09/2018.  Ambulatory dysfunction/physical debility She has chronic knee pain Fall precautions Continue physical therapy   DVT prophylaxis:Xarelto. Code Status:Full code.    Discharge Exam: BP (!) 128/59 (BP Location: Right Arm)   Pulse 100   Temp 97.8 F (36.6 C) (Oral)   Resp 19   Ht 6' (1.829 m)   Wt (!) 138.2 kg   SpO2 100%   BMI 41.31 kg/m  . General: 43 y.o. year-old female well developed well nourished in no acute distress.  Alert and oriented x3. . Cardiovascular: Regular rate and rhythm with no rubs or gallops.  No thyromegaly or JVD noted.   Marland Kitchen Respiratory: Clear to auscultation with no wheezes or rales. Good inspiratory effort. . Abdomen: Soft nontender nondistended with normal bowel sounds x4 quadrants. . Musculoskeletal: No lower extremity edema. 2/4 pulses in all 4 extremities. . Skin: No ulcerative lesions noted or rashes, . Psychiatry: Mood is appropriate for condition and setting  Discharge Instructions You were cared for by a hospitalist during your hospital stay. If you have any questions about your discharge medications or the care you received while you were in the hospital after you are discharged, you can call the unit and asked to speak with the hospitalist on call if the hospitalist that took care of you is not available. Once you are discharged, your primary care physician will handle any further medical issues. Please note that NO REFILLS for any discharge  medications will be authorized once you are discharged, as it is imperative that you return to your primary care physician (or establish a relationship with a primary care physician if you do not have one) for your aftercare needs so that they can reassess your need for medications and monitor your lab values.  Discharge Instructions    Amb Referral to Cardiac Rehabilitation   Complete by:  As directed    Referring to [place] CRP 2   Diagnosis:  Heart Failure (see criteria below if ordering Phase II)   Heart Failure Type:  Chronic Systolic & Diastolic   DME Nebulizer machine   Complete by:  As directed    Patient needs a nebulizer to treat with the following condition:  COPD (chronic obstructive pulmonary disease) ([place])     Allergies as of 08/15/2018      Reactions   Methocarbamol Anaphylaxis, Swelling   Tramadol Anaphylaxis   Tolerates Dilaudid, Oxycontin 04/04/16   Bee Venom Hives      Medication List    STOP taking these medications   furosemide 40 MG tablet Commonly known as:  LASIX   insulin regular 100 units/mL injection Commonly known as:  NOVOLIN R,HUMULIN R   medroxyPROGESTERone 150 MG/ML injection Commonly known as:  DEPO-PROVERA   meloxicam 15 MG tablet Commonly known as:  MOBIC     TAKE these medications   amitriptyline 50 MG tablet Commonly known as:  ELAVIL Take 50 mg by mouth at bedtime.   carvedilol 3.125 MG tablet Commonly known as:  COREG Take 1 tablet (3.125 mg total) by mouth 2 (two) times daily with a meal.   CHANTIX CONTINUING MONTH PAK 1 MG tablet Generic drug:  varenicline Take 1 tablet (1 mg total) by mouth 2 (two) times daily.   cholecalciferol 25 MCG (1000 UT) tablet Commonly known as:  VITAMIN D3 Take 3,000 Units by mouth daily.   ciclopirox 8 % solution Commonly known as:  PENLAC Apply 1 application topically 2 (two) times daily as needed (onychomycosis).   clotrimazole-betamethasone cream Commonly known as:  LOTRISONE Apply 1  application topically 2 (two) times daily as needed (irritation).   cyclobenzaprine 10 MG tablet Commonly known as:  FLEXERIL Take 10 mg by mouth 3 (three) times daily.   digoxin 0.125 MG tablet Commonly known as:  LANOXIN Take 1 tablet (0.125 mg total) by mouth daily. Start taking on:  August 16, 2018   docusate sodium 100 MG capsule Commonly known as:  COLACE Take 100 mg by mouth 2 (two) times daily.   gabapentin 600 MG tablet Commonly known as:  NEURONTIN Take 600 mg by mouth 4 (four) times daily.   ipratropium-albuterol 0.5-2.5 (3) MG/3ML Soln Commonly known as:  DUONEB Take 3 mLs by nebulization 2 (two) times daily.   losartan 25 MG tablet Commonly known as:  COZAAR Take 1 tablet (25 mg total) by mouth daily. Start taking on:  August 16, 2018   metFORMIN 500 MG 24 hr tablet Commonly known as:  GLUCOPHAGE-XR Take 500 mg 2 (two) times daily by mouth.   mometasone-formoterol 200-5 MCG/ACT Aero Commonly known as:  DULERA Inhale 2 puffs into the lungs 2 (two) times daily.   multivitamin with minerals Tabs tablet Take 1 tablet by mouth daily.   oxyCODONE-acetaminophen 5-325 MG tablet Commonly known as:  PERCOCET/ROXICET Take 1 tablet by mouth 4 (four) times daily.   OXYCONTIN 10 mg 12 hr tablet Generic drug:  oxyCODONE Take 10 mg by mouth every 12 (twelve) hours.   polyethylene glycol powder powder Commonly known as:  GLYCOLAX/MIRALAX Take 17 g daily as needed by mouth for mild constipation. Averages once a month   potassium chloride SA 20 MEQ tablet Commonly known as:  K-DUR,KLOR-CON Take 1 tablet (20 mEq total) by mouth daily. What changed:  when to take this   rosuvastatin 10 MG tablet Commonly known as:  CRESTOR Take 10 mg by mouth at bedtime.   spironolactone 25 MG tablet Commonly known as:  ALDACTONE Take 0.5 tablets (12.5 mg total) by mouth daily. Start taking on:  August 16, 2018   torsemide 20 MG tablet Commonly known as:  DEMADEX Take 2  tablets (40 mg total) by mouth daily. Start taking on:  August 16, 2018   XARELTO 20 MG Tabs tablet Generic drug:  rivaroxaban Take 20 mg by mouth daily.            Durable Medical Equipment  (From admission, onward)         Start     Ordered   08/15/18 1457  For home use only DME Nebulizer machine  Once    Question:  Patient needs a nebulizer to treat with the following condition  Answer:  COPD (chronic obstructive pulmonary disease) ([place])   08/15/18 1457   08/15/18 0000  DME Nebulizer machine  Question:  Patient needs a nebulizer to treat with the following condition  Answer:  COPD (chronic obstructive pulmonary disease) ([place])   08/15/18 1450   08/14/18 1102  For home use only DME Vest life vest  Once     08/14/18 1101         Allergies  Allergen Reactions  . Methocarbamol Anaphylaxis and Swelling  . Tramadol Anaphylaxis    Tolerates Dilaudid, Oxycontin 04/04/16  . Bee Venom Hives   Follow-up Information    [facility]. Go on 08/28/2018.   Specialty:  Cardiology Why:  at 12 noon in the [facility].  Please bring all medications to appt.  Gate code [phone] for Jan. Sport and exercise psychologist information: [id number] [phone] [place], [place]. Call in 1 day(s).   Why:  Call for a post hospital follow-up appointment. Contact information: [place] [address] [phone]            The results of significant diagnostics from this hospitalization (including imaging, microbiology, ancillary and laboratory) are listed below for reference.    Significant Diagnostic Studies: Dg Chest 2 View  Result Date: 08/08/2018 CLINICAL DATA:  Acute onset of shortness of breath, cough and bilateral lower extremity fluid retention. EXAM: CHEST - 2 VIEW COMPARISON:  Chest radiograph performed 10/10/2016 FINDINGS: The lungs are well-aerated.  Vascular congestion is noted. There is no evidence of focal opacification, pleural effusion or pneumothorax. The heart is enlarged.  No acute osseous abnormalities are seen. IMPRESSION: Vascular congestion and cardiomegaly. Lungs remain grossly clear. Electronically Signed   By: Garald Balding M.D.   On: 08/08/2018 01:30   Vas [place] Lower Extremity Venous (dvt)  Result Date: 08/09/2018  Lower Venous Study Indications: History of DVT, and Swelling.  Performing Technologist: Maudry Mayhew MHA, RDMS, RVT, RDCS  Examination Guidelines: A complete evaluation includes B-mode imaging, spectral Doppler, color Doppler, and power Doppler as needed of all accessible portions of each vessel. Bilateral testing is considered an integral part of a complete examination. Limited examinations for reoccurring indications may be performed as noted.  Right Venous Findings: +---------+---------------+---------+-----------+----------+--------------+          CompressibilityPhasicitySpontaneityPropertiesSummary        +---------+---------------+---------+-----------+----------+--------------+ CFV      Full           Yes      No                                  +---------+---------------+---------+-----------+----------+--------------+ SFJ      Full                                                        +---------+---------------+---------+-----------+----------+--------------+ FV Prox  Full                                                        +---------+---------------+---------+-----------+----------+--------------+ FV Mid   Full                                                        +---------+---------------+---------+-----------+----------+--------------+  FV DistalFull                                                        +---------+---------------+---------+-----------+----------+--------------+ PFV      Full                                                         +---------+---------------+---------+-----------+----------+--------------+ POP      Full           Yes      Yes                                 +---------+---------------+---------+-----------+----------+--------------+ PTV                                                   Not visualized +---------+---------------+---------+-----------+----------+--------------+ PERO                                                  Not visualized +---------+---------------+---------+-----------+----------+--------------+  Left Venous Findings: +---------+---------------+---------+-----------+----------+--------------+          CompressibilityPhasicitySpontaneityPropertiesSummary        +---------+---------------+---------+-----------+----------+--------------+ CFV      Full           Yes      Yes                                 +---------+---------------+---------+-----------+----------+--------------+ SFJ      Full                                                        +---------+---------------+---------+-----------+----------+--------------+ FV Prox  Full                                                        +---------+---------------+---------+-----------+----------+--------------+ FV Mid   Full                                                        +---------+---------------+---------+-----------+----------+--------------+ FV DistalFull                                                        +---------+---------------+---------+-----------+----------+--------------+  PFV      Full                                                        +---------+---------------+---------+-----------+----------+--------------+ POP      Full           Yes      Yes                                 +---------+---------------+---------+-----------+----------+--------------+ PTV      Full                                                         +---------+---------------+---------+-----------+----------+--------------+ PERO                                                  Not visualized +---------+---------------+---------+-----------+----------+--------------+    Summary: Right: There is no evidence of deep vein thrombosis in the lower extremity. However, portions of this examination were limited- see technologist comments above. No cystic structure found in the popliteal fossa. Left: There is no evidence of deep vein thrombosis in the lower extremity. However, portions of this examination were limited- see technologist comments above. No cystic structure found in the popliteal fossa.  *See table(s) above for measurements and observations. Electronically signed by Monica Martinez MD on 08/09/2018 at 2:03:36 PM.    Final    [place] Ekg Site Rite  Result Date: 08/10/2018 If Site Rite image not attached, placement could not be confirmed due to current cardiac rhythm.   Microbiology: No results found for this or any previous visit (from the past 240 hour(s)).   Labs: Basic Metabolic Panel: Recent Labs  Lab 08/11/18 0524 08/12/18 0414 08/13/18 0323 08/14/18 0506 08/15/18 0705  NA 138 137 133* 137 137  K 3.7 3.9 4.6 3.9 4.0  CL 99 97* 93* 102 99  CO2 30 29 30 27 28   GLUCOSE 146* 147* 133* 143* 142*  BUN 12 19 24* 23* 29*  CREATININE 0.97 1.19* 1.51* 1.00 0.98  CALCIUM 8.4* 9.0 9.3 9.0 9.1  MG 2.1 2.4 2.3 2.3 2.4   Liver Function Tests: No results for input(s): AST, ALT, ALKPHOS, BILITOT, PROT, ALBUMIN in the last 168 hours. No results for input(s): LIPASE, AMYLASE in the last 168 hours. No results for input(s): AMMONIA in the last 168 hours. CBC: Recent Labs  Lab 08/10/18 0552 08/15/18 0705  WBC 7.7 6.3  HGB 13.6 14.6  HCT 43.3 46.6*  MCV 70.1* 69.6*  PLT 408* 324   Cardiac Enzymes: No results for input(s): CKTOTAL, CKMB, CKMBINDEX, TROPONINI in the last 168 hours. BNP: BNP (last 3 results) Recent Labs     07/22/18 1756 08/08/18 0057  BNP 1,149.2* 810.5*    ProBNP (last 3 results) No results for input(s): PROBNP in the last 8760 hours.  CBG: Recent Labs  Lab 08/14/18 1118 08/14/18 1610 08/14/18 2100 08/15/18 0644 08/15/18 1139  GLUCAP 132* 115* 103* 176*  131*       Signed:  Kayleen Memos, MD [facility] 08/15/2018, 3:04 PM

## 2018-08-15 NOTE — Care Management Note (Signed)
Case Management Note Marvetta Gibbons RN, BSN Transitions of Care Unit 4E- RN Case Manager [phone]  Patient Details  Name: Anna Rowe MRN: [id number] Date of Birth: 1975-08-27  Subjective/Objective:  Pt admitted with acute HF                  Action/Plan: PTA pt lived at home, noted in HF notes pt may need lifevest at discharge- order form has been completed and faxed to [facility] along with support documents- spoke with Clair Gulling- [employer] rep regarding possible need. CM to f/u for transition of care needs  Expected Discharge Date:  08/15/18               Expected Discharge Plan:  [place]  In-House Referral:     Discharge planning Services  CM Consult  Post Acute Care Choice:  Durable Medical Equipment, Home Health Choice offered to:  Patient  DME Arranged:  Nebulizer machine DME Agency:  [place] Arranged:  RN, Disease Management, Patient Refused [place] Agency:     Status of Service:  Completed, signed off  If discussed at [place] of Stay Meetings, dates discussed:    Discharge Disposition: home/self care   Additional Comments:  08/15/18- 1500- Marvetta Gibbons RN, CM- notified by bedside RN- pt will need home nebulizer- order has been placed and call made to [facility] with [facility]- nebulizer to be delivered to bedside prior to discharge - also noted HHRN order has been place- call made to pt's room- and spoke with pt via TC about [place] needs for disease/medicaiton management- per pt she has politely declined [place] stating that she does not feel that she would benefit from [facility] services at this time. No referral made. Pt will have close f/u with HF clinic. Also noted in HF notes pt will not need lifevest at this time- notified Clair Gulling with [employer] that LifeVest on hold at this time.   Dawayne Patricia, RN 08/15/2018, 3:18 PM

## 2018-08-15 NOTE — Progress Notes (Signed)
Patient ID: Anna Rowe, female   DOB: 11-11-1975, 43 y.o.   MRN: [id number]    Advanced Heart Failure Rounding Note   Subjective:    Good diuresis again yesterday.  Now on torsemide.    No NSVT off milrinone. CVP 8, co-ox 62%.  Pending labs.   No dyspnea, feels good overall.   Cath 12/27 Findings: Ao = 99/75 (81) LV = 93/31 RA = 22 RV = 54/25 PA = 58/24 (41) PCW = 30 Fick cardiac output/index = 4.1/1.6 SVR = 1163 PVR = 2.6 FA sat = 89% PA sat = 42%, 44% Assessment: 1. 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 Severe NICM with LVEF ~ 15% 3. Marked volume overload with low output   Objective:   Weight Range:  Vital Signs:   Temp:  [97.8 F (36.6 C)-98.7 F (37.1 C)] 97.8 F (36.6 C) (01/01 0647) Pulse Rate:  [50-112] 101 (01/01 0840) Resp:  [16-26] 26 (01/01 0840) BP: (97-127)/(74-83) 127/83 (01/01 0840) SpO2:  [95 %-100 %] 97 % (01/01 0840) Last BM Date: 08/14/18  Weight change: Filed Weights   08/12/18 0500 08/13/18 0333 08/14/18 0322  Weight: (!) 140.3 kg (!) 137.8 kg (!) 138.2 kg    Intake/Output:   Intake/Output Summary (Last 24 hours) at 08/15/2018 0948 Last data filed at 08/15/2018 0849 Gross per 24 hour  Intake 380 ml  Output 3100 ml  Net -2720 ml     Physical Exam: General: NAD Neck: Thick, no JVD, no thyromegaly or thyroid nodule.  Lungs: Clear to auscultation bilaterally with normal respiratory effort. CV: Nonpalpable PMI.  Heart regular S1/S2, no S3/S4, no murmur.  No peripheral edema.   Abdomen: Soft, nontender, no hepatosplenomegaly, no distention.  Skin: Intact without lesions or rashes.  Neurologic: Alert and oriented x 3.  Psych: Normal affect. Extremities: No clubbing or cyanosis.  HEENT: Normal.   Telemetry: NSR 100s, no NSVT   Labs: Basic Metabolic Panel: Recent Labs  Lab 08/10/18 0552 08/11/18 0524 08/12/18 0414 08/13/18 0323 08/14/18 0506  NA 139 138 137 133* 137  K 3.9 3.7 3.9 4.6 3.9  CL 100 99  97* 93* 102  CO2 28 30 29 30 27   GLUCOSE 140* 146* 147* 133* 143*  BUN 16 12 19  24* 23*  CREATININE 1.14* 0.97 1.19* 1.51* 1.00  CALCIUM 8.7* 8.4* 9.0 9.3 9.0  MG 2.2 2.1 2.4 2.3 2.3    Liver Function Tests: No results for input(s): AST, ALT, ALKPHOS, BILITOT, PROT, ALBUMIN in the last 168 hours. No results for input(s): LIPASE, AMYLASE in the last 168 hours. No results for input(s): AMMONIA in the last 168 hours.  CBC: Recent Labs  Lab 08/10/18 0552 08/15/18 0705  WBC 7.7 6.3  HGB 13.6 14.6  HCT 43.3 46.6*  MCV 70.1* 69.6*  PLT 408* 324    Cardiac Enzymes: No results for input(s): CKTOTAL, CKMB, CKMBINDEX, TROPONINI in the last 168 hours.  BNP: BNP (last 3 results) Recent Labs    07/22/18 1756 08/08/18 0057  BNP 1,149.2* 810.5*    ProBNP (last 3 results) No results for input(s): PROBNP in the last 8760 hours.    Other results:  Imaging: No results found.   Medications:     Scheduled Medications: . amitriptyline  50 mg Oral QHS  . cyclobenzaprine  10 mg Oral TID  . digoxin  0.125 mg Oral Daily  . gabapentin  600 mg Oral QID  . insulin aspart  0-15 Units Subcutaneous TID WC  . ipratropium-albuterol  3 mL Nebulization TID  . losartan  25 mg Oral Daily  . mometasone-formoterol  2 puff Inhalation BID  . multivitamin with minerals  1 tablet Oral Daily  . oxyCODONE  10 mg Oral Q12H  . polyethylene glycol  17 g Oral Daily  . rivaroxaban  20 mg Oral Q supper  . rosuvastatin  10 mg Oral QHS  . senna-docusate  2 tablet Oral BID  . sodium chloride flush  10-40 mL Intracatheter Q12H  . sodium chloride flush  3 mL Intravenous Q12H  . spironolactone  12.5 mg Oral Daily  . torsemide  40 mg Oral Daily  . varenicline  1 mg Oral BID WC    Infusions: . sodium chloride    . sodium chloride      PRN Medications: sodium chloride, sodium chloride, acetaminophen, ipratropium-albuterol, ondansetron (ZOFRAN) IV, oxyCODONE-acetaminophen, sodium chloride flush,  sodium chloride flush   Assessment:   Anna Rowe is a 43 y.o. female with a history of hyperlipidemia, DM, COPD, sleep apnea noncompliant with CPAP, chronic DVT (diagnosed 2018) on Xarelto, tobacco use, right knee replacement 2015, and COPD.  She presented to [facility] 08/08/18 with worsening LE edema. Echo showed newly reduced EF 15-20%. [facility] 12/27 showed marked volume overload and low output and minimal nonobstructive CAD.   Plan/Discussion:    1. Acute systolic HF due to NICM. Unclear etiology. ?viral illness in October vs CPAP. Also noncompliant with CPAP. A1C only 7.2. HIV negative. Has HTN, but seems well controlled.  Echo 12/25: EF 15-20%, severe diffuse HK and septal dyskinesis, grade 2 DD, mod MR, LA moderately dilated, RV moderately dilated with moderately reduced function, PA peak pressure 35 mm Hg.  [facility] 12/27 showed marked volume overload with low output, severe NICM with EF 15%, and minimal non-obstructive CAD.  On milrinone and diuresed, now off milrinone with stable co-ox 62% today.  On po torsemide, CVP 8.  - Continue digoxin 0.125 daily.  - Continue torsemide 40 mg daily.  - Start Coreg 3.125 mg bid.  - Continue losartan 25 daily. If BP remains stable, can transition to [facility] as outpatient.  - Continue spiro 12.5 mg daily.  - She is on Depo injections for birth control. Pregnancy test negative on admission.  2. Minimal nonobstructive CAD: [facility] 12/27: 30% Prox LAD - Continue crestor.  - No ASA with Xarelto use 3. DVT - Continue Xarelto 20 mg daily.  4. OSA: Has CPAP at home, but has been noncompliant because she thought OSA had resolved. Last sleep study 6-7 years ago. She has a repeat sleep study scheduled for 1/21 and they are discussing need for BIPAP [facility]) - Wearing CPAP here.  5. COPD: Current smoker 1/2 ppd. On Chantix. Encouraged cessation. No change.  6. NSVT: No NSVT off milrinone.  Will start Coreg 3.125 mg bid and can hold off on  Lifevest.  7. AKI: Creatinine 1.19 > 1.51 > 1.0. Pending BMET today.  8. Disposition: She can go home today with close followup in CHF clinic.  Meds for home: Xarelto 20 daily, spironolactone 12.5 daily, losartan 25 daily, Coreg 3.125 mg bid, digoxin 0.125 daily, torsemide 40 daily, Crestor 10 daily, KCl 20 daily, Chantix.   Length of Stay: 6  Loralie Champagne, MD 08/15/2018, 9:48 AM  Advanced Heart Failure Team Pager [phone] (M-F; 7a - 4p)  Please contact [facility] for night-coverage after hours (4p -7a ) and weekends on [email or web address]

## 2018-08-15 NOTE — Progress Notes (Addendum)
Pt discharged home with family. PICC removed. Discharge instructions reviewed with pt. All questions answered. Pt educated on importance of fluid restriction, low sodium diet, follow-up appointments, etc. Pt received prescriptions and was instructed to get them filled at her pharmacy. Pt verbalized understanding. Pt discharged home with all of her belongings. Pt had nebulizer machine (for home use) delivered to room prior to discharge. Pt discharged via wheelchair and was accompanied by this RN and pt's family.  Ara Kussmaul BSN, RN

## 2018-08-15 NOTE — Discharge Instructions (Signed)
Edema  Edema is when you have too much fluid in your body or under your skin. Edema may make your legs, feet, and ankles swell up. Swelling is also common in looser tissues, like around your eyes. This is a common condition. It gets more common as you get older. There are many possible causes of edema. Eating too much salt (sodium) and being on your feet or sitting for a long time can cause edema in your legs, feet, and ankles. Hot weather may make edema worse. Edema is usually painless. Your skin may look swollen or shiny. Follow these instructions at home:  Keep the swollen body part raised (elevated) above the level of your heart when you are sitting or lying down.  Do not sit still or stand for a long time.  Do not wear tight clothes. Do not wear garters on your upper legs.  Exercise your legs. This can help the swelling go down.  Wear elastic bandages or support stockings as told by your doctor.  Eat a low-salt (low-sodium) diet to reduce fluid as told by your doctor.  Depending on the cause of your swelling, you may need to limit how much fluid you drink (fluid restriction).  Take over-the-counter and prescription medicines only as told by your doctor. Contact a doctor if:  Treatment is not working.  You have heart, liver, or kidney disease and have symptoms of edema.  You have sudden and unexplained weight gain. Get help right away if:  You have shortness of breath or chest pain.  You cannot breathe when you lie down.  You have pain, redness, or warmth in the swollen areas.  You have heart, liver, or kidney disease and get edema all of a sudden.  You have a fever and your symptoms get worse all of a sudden. Summary  Edema is when you have too much fluid in your body or under your skin.  Edema may make your legs, feet, and ankles swell up. Swelling is also common in looser tissues, like around your eyes.  Raise (elevate) the swollen body part above the level of your  heart when you are sitting or lying down.  Follow your doctor's instructions about diet and how much fluid you can drink (fluid restriction). This information is not intended to replace advice given to you by your health care provider. Make sure you discuss any questions you have with your health care provider. Document Released: 01/18/2008 Document Revised: 08/19/2016 Document Reviewed: 08/19/2016 Elsevier Interactive Patient Education  2019 [place] What is cardiac rehabilitation? Cardiac rehabilitation is a treatment program that helps improve the health and well-being of people who have heart problems. Cardiac rehabilitation includes exercise training, education, and counseling to help you get stronger and return to an active lifestyle. This program can help you get better faster and reduce any future hospital stays. Why might I need cardiac rehabilitation? Cardiac rehabilitation programs can help when you have or have had:  A heart attack.  Heart failure.  Peripheral artery disease.  Coronary artery disease.  Angina.  Lung or breathing problems. Cardiac rehabilitation programs are also used when you have had:  Coronary artery bypass graft surgery.  Heart valve replacement.  Heart stent placement.  Heart transplant.  Aneurysm repair. What are the benefits of cardiac rehabilitation? Cardiac rehabilitation can help:  Reduce problems like chest pain and trouble breathing.  Change risk factors that contribute to heart disease, such as: ? Smoking. ? High blood pressure. ?  High cholesterol. ? Diabetes. ? Being out of shape or not active. ? Weighing more than 30% higher than your ideal weight. ? Diet.  Improve your mental outlook so you feel: ? More hopeful. ? Better about yourself. ? More confident about taking care of yourself.  Get support from health experts as well as other people with similar problems.  Learn how to manage and  understand your medicines.  Teach your family about your condition and how to participate in your recovery. What happens in cardiac rehabilitation? You will be assessed by a cardiac rehabilitation team. They will check your health history and do a physical exam. You may need blood tests, stress tests, and other evaluations to make sure that you are ready to start cardiac rehabilitation. The cardiac rehabilitation team works with you to make a plan based on your health and goals. Your program will be tailored to fit you and your needs and may change as you progress. You may work with a health care team that includes:  Doctors.  Nurses.  Dietitians.  Psychologists.  Exercise specialists.  Physical and occupational therapists. What are the phases of cardiac rehabilitation? A cardiac rehabilitation program is often divided into phases. You advance from one phase to the next. Phase One  This phase starts while you are still in the hospital. You may start by walking in your room and then in the [facility]. You may start some simple exercises with a therapist. Phase Two  This phase begins when you go home or to another facility. This phase may last 8-12 weeks. You will travel to a cardiac rehabilitation center or another place where rehabilitation is offered. You will slowly increase your activity level while being closely watched by a nurse or therapist. Exercises may include a combination of strength or resistance training and cardio or aerobic movement on a treadmill or other machines. Your condition will determine how often and how long these sessions last. In phase two, you may learn how to cook healthy meals, control your blood sugar, and manage your medicines. You may need help with scheduling or planning how and when to take your medicines. If you have questions about your medicines, it is very important that you talk to your health care provider. Phase Three This phase continues for the  rest of your life. There will be less supervision. You may still participate in cardiac rehabilitation activities or become part of a group in your community. You may benefit from talking about your experience with other people who are facing similar challenges. Get help right away if:  You have severe chest discomfort, especially if the pain is crushing or pressure-like and spreads to your arms, back, neck, or jaw. Do not wait to see if the pain will go away.  You have weakness or numbness in your face, arms, or legs, especially on one side of the body.  Your speech is slurred.  You are confused.  You have a sudden severe headache or loss of vision.  You have shortness of breath.  You are sweating and have nausea.  You feel dizzy or faint.  You are fatigued. These symptoms may represent a serious problem that is an emergency. Do not wait to see if the symptoms will go away. Get medical help right away. Call your local emergency services (911 in the [place] Do not drive yourself to the hospital. This information is not intended to replace advice given to you by your health care provider. Make sure you  discuss any questions you have with your health care provider. Document Released: 05/10/2008 Document Revised: 04/24/2018 Document Reviewed: 06/15/2015 Elsevier Interactive Patient Education  2019 [place].   Heart Failure Eating Plan Heart failure, also called congestive heart failure, occurs when your heart does not pump blood well enough to meet your body's needs for oxygen-rich blood. Heart failure is a long-term (chronic) condition. Living with heart failure can be challenging. However, following your health care provider's instructions about a healthy lifestyle and working with a diet and nutrition specialist (dietitian) to choose the right foods may help to improve your symptoms. What are tips for following this plan? General guidelines  Do not eat more than 2,300 mg of salt  (sodium) a day. The amount of sodium that is recommended for you may be lower, depending on your condition.  Maintain a healthy body weight as directed. Ask your health care provider what a healthy weight is for you. ? Check your weight every day. ? Work with your health care provider and dietitian to make a plan that is right for you to lose weight or maintain your current weight.  Limit how much fluid you drink. Ask your health care provider or dietitian how much fluid you can have each day.  Limit or avoid alcohol as told by your health care provider or dietitian. Reading food labels  Check food labels for the amount of sodium per serving. Choose foods that have less than 140 mg (milligrams) of sodium in each serving.  Check food labels for the number of calories per serving. This is important if you need to limit your daily calorie intake to lose weight.  Check food labels for the serving size. If you eat more than one serving, you will be eating more sodium and calories than what is listed on the label.  Look for foods that are labeled as "sodium-free," "very low sodium," or "low sodium." ? Foods labeled as "reduced sodium" or "lightly salted" may still have more sodium than what is recommended for you. Cooking  Avoid adding salt when cooking. Ask your health care provider or dietitian before using salt substitutes.  Season food with salt-free seasonings, spices, or herbs. Check the label of seasoning mixes to make sure they do not contain salt.  Cook with heart-healthy oils, such as olive, canola, soybean, or sunflower oil.  Do not fry foods. Cook foods using low-fat methods, such as baking, boiling, grilling, and broiling.  Limit unhealthy fats when cooking by: ? Removing the skin from poultry, such as chicken. ? Removing all visible fats from meats. ? Skimming the fat off from stews, soups, and gravies before serving them. Meal planning   Limit your intake  of: ? Processed, canned, or pre-packaged foods. ? Foods that are high in trans fat, such as fried foods. ? Sweets, desserts, sugary drinks, and other foods with added sugar. ? Full-fat dairy products, such as whole milk.  Eat a balanced diet that includes: ? 4-5 servings of fruit each day and 4-5 servings of vegetables each day. At each meal, try to fill half of your plate with fruits and vegetables. ? Up to 6-8 servings of whole grains each day. ? Up to 2 servings of lean meat, poultry, or fish each day. One serving of meat is equal to 3 oz. This is about the same size as a deck of cards. ? 2 servings of low-fat dairy each day. ? Heart-healthy fats. Healthy fats called omega-3 fatty acids are found in  foods such as flaxseed and cold-water fish like sardines, salmon, and mackerel.  Aim to eat 25-35 g (grams) of fiber a day. Foods that are high in fiber include apples, broccoli, carrots, beans, peas, and whole grains.  Do not add salt or condiments that contain salt (such as soy sauce) to foods before eating.  When eating at a restaurant, ask that your food be prepared with less salt or no salt, if possible.  Try to eat 2 or more vegetarian meals each week.  Eat more home-cooked food and eat less restaurant, buffet, and fast food. Recommended foods The items listed may not be a complete list. Talk with your dietitian about what dietary choices are best for you. Grains Bread with less than 80 mg of sodium per slice. Whole-wheat pasta, quinoa, and brown rice. Oats and oatmeal. Barley. [place]. Grits and cream of wheat. Whole-grain and whole-wheat cold cereal. Vegetables All fresh vegetables. Vegetables that are frozen without sauce or added salt. Low-sodium or sodium-free canned vegetables. Fruits All fresh, frozen, and canned fruits. Dried fruits, such as raisins, prunes, and cranberries. Meats and other protein foods Lean cuts of meat. Skinless chicken and [place]. Fish with high omega-3  fatty acids, such as salmon, sardines, and other cold-water fishes. Eggs. Dried beans, peas, and edamame. Unsalted nuts and nut butters. Dairy Low-fat or nonfat (skim) milk and dried milk. Rice milk, soy milk, and almond milk. Low-fat or nonfat yogurt. Small amounts of reduced-sodium block cheese. Low-sodium cottage cheese. Fats and oils Olive, canola, soybean, flaxseed, or sunflower oil. Avocado. Sweets and desserts Apple sauce. Granola bars. Sugar-free pudding and gelatin. Frozen fruit bars. Seasoning and other foods Fresh and dried herbs. Lemon or lime juice. Vinegar. Low-sodium ketchup. Salt-free marinades, salad dressings, sauces, and seasonings. Foods to avoid The items listed may not be a complete list. Talk with your dietitian about what dietary choices are best for you. Grains Bread with more than 80 mg of sodium per slice. Hot or cold cereal with more than 140 mg sodium per serving. Salted pretzels and crackers. Pre-packaged breadcrumbs. Bagels, croissants, and biscuits. Vegetables Canned vegetables. Frozen vegetables with sauce or seasonings. Creamed vegetables. [place] fries. Onion rings. Pickled vegetables and sauerkraut. Fruits Fruits that are dried with sodium-containing preservatives. Meats and other protein foods Ribs and chicken wings. Bacon, ham, pepperoni, bologna, salami, and packaged luncheon meats. Hot dogs, bratwurst, and sausage. Canned meat. Smoked meat and fish. Salted nuts and seeds. Dairy Whole milk, half-and-half, and cream. Buttermilk. Processed cheese, cheese spreads, and cheese curds. Regular cottage cheese. Feta cheese. Shredded cheese. String cheese. Fats and oils Butter, lard, shortening, ghee, and bacon fat. Canned and packaged gravies. Seasoning and other foods Onion salt, garlic salt, table salt, and sea salt. Marinades. Regular salad dressings. Relishes, pickles, and olives. Meat flavorings and tenderizers, and bouillon cubes. Horseradish, ketchup, and  mustard. Worcestershire sauce. Teriyaki sauce, soy sauce (including reduced sodium). Hot sauce and Tabasco sauce. Steak sauce, fish sauce, oyster sauce, and cocktail sauce. Taco seasonings. Barbecue sauce. Tartar sauce. Summary  A heart failure eating plan includes changes that limit your intake of sodium and unhealthy fat, and it may help you lose weight or maintain a healthy weight. Your health care provider may also recommend limiting how much fluid you drink.  Most people with heart failure should eat no more than 2,300 mg of salt (sodium) a day. The amount of sodium that is recommended for you may be lower, depending on your condition.  Contact your health  care provider or dietitian before making any major changes to your diet. This information is not intended to replace advice given to you by your health care provider. Make sure you discuss any questions you have with your health care provider. Document Released: 12/16/2016 Document Revised: 12/16/2016 Document Reviewed: 12/16/2016 Elsevier Interactive Patient Education  2019 [place].   Heart Failure Exacerbation  Heart failure is a condition in which the heart does not fill up with enough blood, and therefore does not pump enough blood and oxygen to the body. When this happens, parts of the body do not get the blood and oxygen they need to function properly. This can cause symptoms such as breathing problems, fatigue, swelling, and confusion. Heart failure exacerbation refers to heart failure symptoms that get worse. The symptoms may get worse suddenly or develop slowly over time. Heart failure exacerbation is a serious medical problem that should be treated right away. What are the causes? A heart failure exacerbation can be triggered by:  Not taking your heart failure medicines correctly.  Infections.  Eating an unhealthy diet or a diet that is high in salt (sodium).  Drinking too much fluid.  Drinking alcohol.  Taking  illegal drugs, such as cocaine or methamphetamine.  Not exercising. Other causes include:  Other heart conditions such as an irregular heartbeat (arrhythmia).  Anemia.  Other medical problems, such as kidney failure. Sometimes the cause of the exacerbation is not known. What are the signs or symptoms? When heart failure symptoms suddenly or slowly get worse, this may be a sign of heart failure exacerbation. Symptoms of heart failure include:  Breathing problems or shortness of breath.  Chronic coughing or wheezing.  Fatigue.  Nausea or lack of appetite.  Feeling light-headed.  Confusion or memory loss.  Increased heart rate or irregular heartbeat.  Buildup of fluid in the legs, ankles, feet, or abdomen.  Difficulty breathing when lying down. How is this diagnosed? This condition is diagnosed based on:  Your symptoms and medical history.  A physical exam. You may also have tests, including:  Electrocardiogram (ECG). This test measures the electrical activity of your heart.  Echocardiogram. This test uses sound waves to take a picture of your heart to see how well it works.  Blood tests.  Imaging tests, such as: ? Chest X-ray. ? MRI. ? Ultrasound.  Stress test. This test examines how well your heart functions when you exercise. Your heart is monitored while you exercise on a treadmill or exercise bike. If you cannot exercise, medicines may be used to increase your heartbeat in place of exercise.  Cardiac catheterization. During this test, a thin, flexible tube (catheter) is inserted into a blood vessel and threaded up to your heart. This test allows your health care provider to check the arteries that lead to your heart (coronary arteries).  Right heart catheterization. During this test, the pressure in your heart is measured. How is this treated? This condition may be treated by:  Adjusting your heart medicines.  Maintaining a healthy lifestyle. This  includes: ? Eating a heart-healthy diet that is low in sodium. ? Not using any products that contain nicotine or tobacco, such as cigarettes and e-cigarettes. ? Regular exercise. ? Monitoring your fluid intake. ? Monitoring your weight and reporting changes to your health care provider.  Treating sleep apnea, if you have this condition.  Surgery. This may include: ? Implanting a device that helps both sides of your heart contract at the same time (cardiac resynchronization  therapy device). This can help with heart function and relieve heart failure symptoms. ? Implanting a device that can correct heart rhythm problems (implantable cardioverter defibrillator). ? Connecting a device to your heart to help it pump blood (ventricular assist device). ? Heart transplant. Follow these instructions at home: Medicines  Take over-the-counter and prescription medicines only as told by your health care provider.  Do not stop taking your medicines or change the amount you take. If you are having problems or side effects from your medicines, talk to your health care provider.  If you are having difficulty paying for your medicines, contact a social worker or your clinic. There are many programs to assist with medicine costs.  Talk to your health care provider before starting any new medicines or supplements.  Make sure your health care provider and pharmacist have a list of all the medicines you are taking. Eating and drinking   Avoid drinking alcohol.  Eat a heart-healthy diet as told by your health care provider. This includes: ? Plenty of fruits and vegetables. ? Lean proteins. ? Low-fat dairy. ? Whole grains. ? Foods that are low in sodium. Activity   Exercise regularly as told by your health care provider. Balance exercise with rest.  Ask your health care provider what activities are safe for you. This includes sexual activity, exercise, and daily tasks at home or  work. Lifestyle  Do not use any products that contain nicotine or tobacco, such as cigarettes and e-cigarettes. If you need help quitting, ask your health care provider.  Maintain a healthy weight. Ask your health care provider what weight is healthy for you.  Consider joining a patient support group. This can help with emotional problems you may have, such as stress and anxiety. General instructions  Talk to your health care provider about flu and pneumonia vaccines.  Keep a list of medicines that you are taking. This may help in emergency situations.  Keep all follow-up visits as told by your health care provider. This is important. Contact a health care provider if:  You have questions about your medicines or you miss a dose.  You feel anxious, depressed, or stressed.  You have swelling in your feet, ankles, legs, or abdomen.  You have shortness of breath during activity or exercise.  You have a cough.  You have a fever.  You have trouble sleeping.  You gain 2-3 lb (1-1.4 kg) in 24 hours or 5 lb (2.3 kg) in a week. Get help right away if:  You have chest pain.  You have shortness of breath while resting.  You have severe fatigue.  You are confused.  You have severe dizziness.  You have a rapid or irregular heartbeat.  You have nausea or you vomit.  You have a cough that is worse at night or you cannot lie flat.  You have a cough that will not go away.  You have severe depression or sadness. Summary  When heart failure symptoms get worse, it is called heart failure exacerbation.  Common causes of this condition include taking medicines incorrectly, infections, and drinking alcohol.  This condition may be treated by adjusting medicines, maintaining a healthy lifestyle, or surgery.  Do not stop taking your medicines or change the amount you take. If you are having problems or side effects from your medicines, talk to your health care provider. This  information is not intended to replace advice given to you by your health care provider. Make sure you discuss any  questions you have with your health care provider. Document Released: 12/13/2016 Document Revised: 12/13/2016 Document Reviewed: 12/13/2016 Elsevier Interactive Patient Education  2019 [place].   Heart Failure Action Plan A heart failure action plan helps you understand what to do when you have symptoms of heart failure. Follow the plan that was created by you and your health care provider. Review your plan each time you visit your health care provider. Red zone These signs and symptoms mean you should get medical help right away:  You have trouble breathing when resting.  You have a dry cough that is getting worse.  You have swelling or pain in your legs or abdomen that is getting worse.  You suddenly gain more than 2-3 lb (0.9-1.4 kg) in a day, or more than 5 lb (2.3 kg) in one week. This amount may be more or less depending on your condition.  You have trouble staying awake or you feel confused.  You have chest pain.  You do not have an appetite.  You pass out. If you experience any of these symptoms:  Call your local emergency services (911 in the [place] right away or seek help at the emergency department of the nearest hospital. Yellow zone These signs and symptoms mean your condition may be getting worse and you should make some changes:  You have trouble breathing when you are active or you need to sleep with extra pillows.  You have swelling in your legs or abdomen.  You gain 2-3 lb (0.9-1.4 kg) in one day, or 5 lb (2.3 kg) in one week. This amount may be more or less depending on your condition.  You get tired easily.  You have trouble sleeping.  You have a dry cough. If you experience any of these symptoms:  Contact your health care provider within the next day.  Your health care provider may adjust your medicines. Green zone These signs  mean you are doing well and can continue what you are doing:  You do not have shortness of breath.  You have very little swelling or no new swelling.  Your weight is stable (no gain or loss).  You have a normal activity level.  You do not have chest pain or any other new symptoms. Follow these instructions at home:  Take over-the-counter and prescription medicines only as told by your health care provider.  Weigh yourself daily. Your target weight is __________ lb (__________ kg). ? Call your health care provider if you gain more than __________ lb (__________ kg) in a day, or more than __________ lb (__________ kg) in one week.  Eat a heart-healthy diet. Work with a diet and nutrition specialist (dietitian) to create an eating plan that is best for you.  Keep all follow-up visits as told by your health care provider. This is important. Where to find more information  American Heart Association: www.heart.org Summary  Follow the action plan that was created by you and your health care provider.  Get help right away if you have any symptoms in the Red zone. This information is not intended to replace advice given to you by your health care provider. Make sure you discuss any questions you have with your health care provider. Document Released: 09/10/2016 Document Revised: 04/05/2017 Document Reviewed: 09/10/2016 Elsevier Interactive Patient Education  2019 [place].   Heart Failure  Heart failure means your heart has trouble pumping blood. This makes it hard for your body to work well. Heart failure is usually a  long-term (chronic) condition. You must take good care of yourself and follow your treatment plan from your doctor. Follow these instructions at home: Medicines  Take over-the-counter and prescription medicines only as told by your doctor. ? Do not stop taking your medicine unless your doctor told you to do that. ? Do not skip any doses. ? Refill your  prescriptions before you run out of medicine. You need your medicines every day. Eating and drinking   Eat heart-healthy foods. Talk with a diet and nutrition specialist (dietitian) to make an eating plan.  Choose foods that: ? Have no trans fat. ? Are low in saturated fat and cholesterol.  Choose healthy foods, like: ? Fresh or frozen fruits and vegetables. ? Fish. ? Low-fat (lean) meats. ? Legumes (like beans, peas, and lentils). ? Fat-free or low-fat dairy products. ? Whole-grain foods. ? High-fiber foods.  Limit salt (sodium) if told by your doctor. Ask your nutrition specialist to recommend heart-healthy seasonings.  Cook in healthy ways instead of frying. Healthy ways of cooking include: ? Roasting. ? Grilling. ? Broiling. ? Baking. ? Poaching. ? Steaming. ? Stir-frying.  Limit how much fluid you drink, if told by your doctor. Lifestyle  Do not smoke or use chewing tobacco. Do not use nicotine gum or patches before talking to your doctor.  Limit alcohol intake to no more than 1 drink a day for non-pregnant women and 2 drinks a day for men. One drink equals 12 oz of beer, 5 oz of wine, or 1 oz of hard liquor. ? Tell your doctor if you drink alcohol many times a week. ? Talk with your doctor about whether any alcohol is safe for you. ? You should stop drinking alcohol: ? If your heart has been damaged by alcohol. ? You have very bad heart failure.  Do not use illegal drugs.  Lose weight if told by your doctor.  Do moderate physical activity if told by your doctor. Ask your doctor what activities are safe for you if: ? You are of older age (elderly). ? You have very bad heart failure. Keep track of important information  Weigh yourself every day. ? Weigh yourself every morning after you pee (urinate) and before breakfast. ? Wear the same amount of clothing each time. ? Write down your daily weight. Give your record to your doctor.  Check and write down your  blood pressure as told by your doctor.  Check your pulse as told by your doctor. Dealing with heat and cold  If the weather is very hot: ? Avoid activity that takes a lot of energy. ? Use air conditioning or fans, or find a cooler place. ? Avoid caffeine. ? Avoid alcohol. ? Wear clothing that is loose-fitting, lightweight, and light-colored.  If the weather is very cold: ? Avoid activity that takes a lot of energy. ? Layer your clothes. ? Wear mittens or gloves, a hat, and a scarf when you go outside. ? Avoid alcohol. General instructions  Manage other conditions that you have as told by your doctor.  Learn to manage stress. If you need help, ask your doctor.  Plan rest periods for when you get tired.  Get education and support as needed.  Get rehab (rehabilitation) to help you stay independent and to help with everyday tasks.  Stay up to date with shots (immunizations), especially pneumococcal and flu (influenza) shots.  Keep all follow-up visits as told by your doctor. This is important. Contact a doctor if:  You  gain weight quickly.  You are more short of breath than normal.  You cannot do your normal activities.  You tire easily.  You cough more than normal, especially with activity.  You have any or more puffiness (swelling) in areas such as your hands, feet, ankles, or belly (abdomen).  You cannot sleep because it is hard to breathe.  You feel like your heart is beating fast (palpitations).  You get dizzy or light-headed when you stand up. Get help right away if:  You have trouble breathing.  You or someone else notices a change in your awareness. This could be trouble staying awake or trouble concentrating.  You have chest pain or discomfort.  You pass out (faint). Summary  Heart failure means your heart has trouble pumping blood.  Make sure you refill your prescriptions before you run out of medicine. You need your medicines every day.  Keep  records of your weight and blood pressure to give to your doctor.  Contact a doctor if you gain weight quickly. This information is not intended to replace advice given to you by your health care provider. Make sure you discuss any questions you have with your health care provider. Document Released: 05/10/2008 Document Revised: 04/25/2018 Document Reviewed: 08/23/2016 Elsevier Interactive Patient Education  2019 [employer]   Sequential Compression Device What is sequential compression device? A sequential compression device is an inflatable cuff that you wear around your leg or foot. The device squeezes your blood vessels together to force old blood out of your legs and let in fresh blood. To use the device, you lie still while the cuff inflates around your leg at a programmed rate. You may need this device if your legs are swollen from poor circulation. You may also need it if you are on bed rest to prevent a blood clot from forming in your legs. Blood clots in your legs can travel to your lungs and cause life-threatening problems. Your health care provider will decide when you should use the device, how often it will inflate, and how much pressure it will apply to your leg. What are the risks of using this device? Risks of using this device include:  Irritated or sore skin.  Injury to the nerve that supplies your lower legs, feet, and toes (peroneal nerve). If the device puts too much pressure on your legs, this nerve may temporarily stop working normally. If this happens, your ankle may feel numb or weak.  Developing compartment syndrome. This is a condition in which too much pressure on your leg makes it hard for blood to reach the muscles of the lower leg. This can result in injury to your leg muscles. The risk of developing this condition is low. What are the benefits of using this device? The main benefit of using this device is improved circulation in your legs.Using the device  may:  Reduce swelling (edema) in your legs.  Improve healing if you have leg or foot ulcers from poor circulation.  Lower your risk of blood clots.  Help you begin walking and recovering sooner after surgery or bed rest. What are some other ways to prevent blood clots? A sequential compression device is often used with other methods to prevent blood clots in your legs. Other ways to prevent blood clots include:  Taking a blood-thinning medicine.  Wearing compression stockings.  Using your leg muscles and getting up to walk as soon as you can. When should I contact my health care provider? Seek  medical care if:  You develop any pain or numbness in your leg.  You develop any redness or your skin breaks down where the device touches your skin.  Your device stops working. Seek immediate medical care if:  Your leg becomes warm, red, or swollen.  You have severe pain in your leg.  You have chest pain.  You have trouble breathing. This information is not intended to replace advice given to you by your health care provider. Make sure you discuss any questions you have with your health care provider. Document Released: 09/03/2010 Document Revised: 06/28/2016 Document Reviewed: 03/18/2015 Elsevier Interactive Patient Education  2019 [employer]   Preventing Heart Failure Heart failure is a condition in which the heart has trouble pumping blood. This may mean that the heart cannot pump enough blood out to the body, or that the heart does not fill up with enough blood. Either of those problems can lead to symptoms such as fatigue, trouble breathing, and swelling throughout the body. This is a common medical condition that affects not only the heart, but the entire body. Making certain nutrition and lifestyle changes can help you prevent heart failure and avoid serious health problems. What nutrition changes can be made?   If you are overweight or obese, reduce how many calories  you eat each day so that you lose weight. Work with your health care provider or a diet and nutrition specialist (dietitian) to determine how many calories you need each day.  Eat foods that are low in salt (sodium). Avoid adding extra salt to foods.  Eat a well-balanced diet that includes a lot of: ? Fresh fruits and vegetables. ? Whole grains. ? Lean meats. ? Beans. ? Fat-free or low-fat dairy products.  Avoid foods that contain a lot of: ? Trans fats. ? Saturated fats. ? Sugar. ? Cholesterol. What lifestyle changes can be made?   Do not use any products that contain nicotine or tobacco, such as cigarettes and e-cigarettes. If you need help quitting or reducing how much you smoke, ask your health care provider.  Stop using alcohol, or limit alcohol intake to no more than 1 drink a day for nonpregnant women and 2 drinks a day for men. One drink equals 12 oz of beer, 5 oz of wine, or 1 oz of hard liquor.  Exercise for at least 150 minutes each week, or as much as told by your health care provider. ? Do moderate-intensity exercise, such as brisk walking, bicycling, or water aerobics. ? Ask your health care provider which activities are safe for you.  See a health care provider regularly for screening and wellness checks. Know your heart health indicators, such as: ? Blood pressure. ? Cholesterol levels. ? Blood sugar (glucose) levels. ? Weight and BMI.  If you have diabetes, manage your condition and follow your treatment plan as instructed.  Try to get 7-9 hours of sleep each night. To help with sleep: ? Keep your bedroom cool and dark. ? Do not eat a heavy meal during the hour before you go to bed. ? Do not drink alcohol or caffeinated drinks before bed. ? Avoid screen time before bedtime. This means avoiding television, computers, tablets, and cell phones.  Find ways to relax and manage stress. These may include: ? Breathing exercises. ? Meditation. ? Yoga. ? Listening  to music. Why are these changes important?  A well-balanced diet with the appropriate amount of calories can keep your body weight at a healthy level, which  reduces strain on your heart.  A low-sodium diet can help keep your blood pressure in a normal range and keep your blood vessels working properly.  Quitting smoking and limiting alcohol intake can reduce harmful effects that these substances have on your heart and blood vessels.  Regular exercise can keep your heart strong so it can pump blood normally.  Managing diabetes helps your blood circulate and can help you maintain a healthy weight.  Managing stress helps to reduce the risk of high blood pressure and heart problems. What can happen if changes are not made? Heart failure can cause very serious problems that may get worse over time, such as:  Extreme fatigue during normal physical activities.  Shortness of breath or trouble breathing.  Swelling in your abdomen, legs, ankles, feet, or neck.  Fluid buildup throughout the body.  Weight gain.  Cough.  Frequent urination. What can I do to lower my risk? You may be able to lower your risk of heart failure by:  Losing weight or keeping your weight under control.  Working with your health care provider to manage your: ? Cholesterol. ? Blood pressure. ? Diabetes, if this applies.  Eating a healthy diet.  Exercising regularly.  Avoiding unhealthy habits, such as smoking, drinking, or using drugs.  Getting plenty of sleep.  Managing your stress. How is this treated? Heart failure cannot be cured except by heart transplant, but treatment can help to improve your quality of life. Treatment may include:  Medicines to help: ? Lower blood pressure. ? Remove excess sodium from your body. ? Relax blood vessels. ? Improve heart function. ? Control other symptoms of heart failure.  Surgery to open blocked coronary arteries or repair damaged heart  valves.  Implantation of a biventricular pacemaker to improve heart muscle function (cardiac resynchronization therapy). This device paces both the right ventricle and left ventricle.  Implantation of a device to treat serious abnormal heart rhythms (implantable cardioverter defibrillator, ICD).  Implantation of a mechanical heart pump to improve the pumping ability of your heart (left ventricular assist device, LVAD).  Heart transplant. This treatment is considered for certain people who do not improve with other treatments. Where to find more information  National Heart, Lung, and Blood Institute: ClickDebate.gl  Centers for Disease Control and Prevention: LawyerNetworking.com.cy  [facility]: https://www.[place].info/  American Heart Association: ReligiousCamps.at.jsp Contact a health care provider if:  You have rapid weight gain.  You have increasing shortness of breath that is unusual for you.  You tire easily, or you are unable to participate in your usual activities.  You cough more than normal, especially with physical activity.  You have any swelling or more swelling in areas such as your hands, feet, ankles, or abdomen. Summary  Heart failure can be prevented by making changes to your diet and your lifestyle.  It is important to eat a healthy diet, manage your weight, exercise regularly, manage stress, avoid drugs and alcohol, and keep your cholesterol and blood pressure under control.  Heart failure can cause very serious problems over time. This information is not intended to replace advice given to you by your health care provider. Make sure you discuss any questions you have with your health care provider. Document Released: 03/22/2016 Document Revised: 10/18/2016 Document Reviewed:  03/22/2016 Elsevier Interactive Patient Education  2019 [place] With Heart Failure  Heart failure is a long-term (chronic) condition in which the heart cannot pump enough blood through the body. When this  happens, parts of the body do not get the blood and oxygen they need. There is no cure for heart failure at this time, so it is important for you to take good care of yourself and follow the treatment plan set by your health care provider. If you are living with heart failure, there are ways to help you manage the disease. Follow these instructions at home: Living with heart failure requires you to make changes in your life. Your health care team will teach you about the changes you need to make in order to relieve your symptoms and lower your risk of going to the hospital. Follow the treatment plan as set by your health care provider. Medicines Medicines are important in reducing your heart's workload, slowing the progression of heart failure, and improving your symptoms.  Take over-the-counter and prescription medicines only as told by your health care provider.  Do not stop taking your medicine unless your health care provider tells you to do that.  Do not skip any dose of your medicine.  Refill prescriptions before you run out of medicine. You need your medicines every day. Eating and drinking   Eat heart-healthy foods. Talk with a dietitian to make an eating plan that is right for you. ? If directed by your health care provider: ? Limit salt (sodium). Lowering your sodium intake may reduce symptoms of heart failure. Ask a dietitian to recommend heart-healthy seasonings. ? Limit your fluid intake. Fluid restriction may reduce symptoms of heart failure. ? Use low-fat cooking methods instead of frying. Low-fat methods include roasting, grilling, broiling, baking, poaching, steaming, and stir-frying. ? Choose foods that contain no trans fat and are low in saturated fat  and cholesterol. Healthy choices include fresh or frozen fruits and vegetables, fish, lean meats, legumes, fat-free or low-fat dairy products, and whole-grain or high-fiber foods.  Limit alcohol intake to no more than 1 drink a day for nonpregnant women and 2 drinks a day for men. One drink equals 12 oz of beer, 5 oz of wine, or 1 oz of hard liquor. ? Drinking more than that is harmful to your heart. Tell your health care provider if you drink alcohol several times a week. ? Talk with your health care provider about whether any level of alcohol use is safe for you. Activity   Ask your health care provider about attending cardiac rehabilitation. These programs include aerobic physical activity, which provides many benefits for your heart.  If no cardiac rehabilitation program is available, ask your health care provider what aerobic exercises are safe for you to do. Lifestyle Make the lifestyle changes recommended by your health care provider. In general:  Lose weight if your health care provider tells you to do that. Weight loss may reduce symptoms of heart failure.  Do not use any products that contain nicotine or tobacco, such as cigarettes or e-cigarettes. If you need help quitting, ask your health care provider.  Do not use street (illegal) drugs.  Return to your normal activities as told by your health care provider. Ask your health care provider what activities are safe for you. General instructions   Make sure you weigh yourself every day to track your weight. Rapid weight gain may indicate an increase in fluid in your body and may increase the workload of your heart. ? Weigh yourself every morning. Do this after you urinate but before you eat breakfast. ? Wear the same type of clothing, without shoes, each time you weigh yourself. ?  Weigh yourself on the same scale and in the same spot each time.  Living with chronic heart failure often leads to emotions such as fear, stress,  anxiety, and depression. If you feel any of these emotions and need help coping, contact your health care provider. Other ways to get help include: ? Talking to friends and family members about your condition. They can give you support and guidance. Explain your symptoms to them and, if comfortable, invite them to attend appointments or rehabilitation with you. ? Joining a support group for people with chronic heart failure. Talking with other people who have the same symptoms may give you new ways of coping with your disease and your emotions.  Stay up to date with your shots (vaccines). Staying current on pneumococcal and influenza vaccines is especially important in preventing germs from attacking your airways (respiratory infections).  Keep all follow-up visits as told by your health care provider. This is important. How to recognize changes in your condition You and your family members need to know what changes to watch for in your condition. Watch for the following changes and report them to your health care provider:  Sudden weight gain. Ask your health care provider what amount of weight gain to report.  Shortness of breath: ? Feeling short of breath while at rest, with no exercise or activity that required great effort. ? Feeling breathless with activity.  Swelling of your lower legs or ankles.  Difficulty sleeping: ? You wake up feeling short of breath. ? You have to use more pillows to raise your head in order to sleep.  Frequent, dry, hacking cough.  Loss of appetite.  Feeling more tired all the time.  Depression or feelings of sadness or hopelessness.  Bloating in the stomach. Where to find more information  Local support groups. Ask your health care provider about groups near you.  The American Heart Association: www.heart.org Contact a health care provider if:  You have a rapid weight gain.  You have increasing shortness of breath that is unusual for  you.  You are unable to participate in your usual physical activities.  You tire easily.  You cough more than normal, especially with physical activity.  You have any swelling or more swelling in areas such as your hands, feet, ankles, or abdomen.  You feel like your heart is beating quickly (palpitations).  You become dizzy or light-headed when you stand up. Get help right away if:  You have difficulty breathing.  You notice or your family notices a change in your awareness, such as having trouble staying awake or having difficulty with concentration.  You have pain or discomfort in your chest.  You have an episode of fainting (syncope). Summary  There is no cure for heart failure, so it is important for you to take good care of yourself and follow the treatment plan set by your health care provider.  Medicines are important in reducing your heart's workload, slowing the progression of heart failure, and improving your symptoms.  Living with chronic heart failure often leads to emotions such as fear, stress, anxiety, and depression. If you are feeling any of these emotions and need help coping, contact your health care provider. This information is not intended to replace advice given to you by your health care provider. Make sure you discuss any questions you have with your health care provider. Document Released: 12/14/2016 Document Revised: 12/14/2016 Document Reviewed: 12/14/2016 Elsevier Interactive Patient Education  2019 [employer].  Peripheral Edema  Peripheral edema is swelling that is caused by a buildup of fluid. Peripheral edema most often affects the lower legs, ankles, and feet. It can also develop in the arms, hands, and face. The area of the body that has peripheral edema will look swollen. It may also feel heavy or warm. Your clothes may start to feel tight. Pressing on the area may make a temporary dent in your skin. You may not be able to move your arm or  leg as much as usual. There are many causes of peripheral edema. It can be a complication of other diseases, such as congestive heart failure, kidney disease, or a problem with your blood circulation. It also can be a side effect of certain medicines. It often happens to women during pregnancy. Sometimes, the cause is not known. Treating the underlying condition is often the only treatment for peripheral edema. Follow these instructions at home: Pay attention to any changes in your symptoms. Take these actions to help with your discomfort:  Raise (elevate) your legs while you are sitting or lying down.  Move around often to prevent stiffness and to lessen swelling. Do not sit or stand for long periods of time.  Wear support stockings as told by your health care provider.  Follow instructions from your health care provider about limiting salt (sodium) in your diet. Sometimes eating less salt can reduce swelling.  Take over-the-counter and prescription medicines only as told by your health care provider. Your health care provider may prescribe medicine to help your body get rid of excess water (diuretic).  Keep all follow-up visits as told by your health care provider. This is important. Contact a health care provider if:  You have a fever.  Your edema starts suddenly or is getting worse, especially if you are pregnant or have a medical condition.  You have swelling in only one leg.  You have increased swelling and pain in your legs. Get help right away if:  You develop shortness of breath, especially when you are lying down.  You have pain in your chest or abdomen.  You feel weak.  You faint. This information is not intended to replace advice given to you by your health care provider. Make sure you discuss any questions you have with your health care provider. Document Released: 09/08/2004 Document Revised: 01/04/2016 Document Reviewed: 02/11/2015 Elsevier Interactive Patient  Education  2019 [employer].

## 2018-08-16 ENCOUNTER — Telehealth (HOSPITAL_COMMUNITY): Payer: Self-pay | Admitting: Surgery

## 2018-08-16 LAB — GLUCOSE, CAPILLARY: Glucose-Capillary: 106 mg/dL — ABNORMAL HIGH (ref 70–99)

## 2018-08-16 NOTE — Telephone Encounter (Signed)
I called to clarify follow-up appt with Ms. Anna Rowe in the [facility].  She is aware and agreeable.

## 2018-08-27 NOTE — Progress Notes (Signed)
Advanced Heart Failure Clinic Note   Referring Physician: PCP: [place] PCP-Cardiologist: No primary care provider on file.   HPI:  Anna Rowe is a 43 y.o. female with a history of hyperlipidemia, DM, COPD, sleep apnea noncompliant with CPAP, chronic DVT (diagnosed 2018) on Xarelto, tobacco use, right knee replacement 2015, and COPD.  Admitted to [facility] 08/08/18 - 08/15/2018 with worsening LE edema. Echo showed newly reduced EF 15-20%. [facility] 12/27 showed marked volume overload and low output and minimal nonobstructive CAD. Diuresed on milrinone, and weaned off as tolerated as medications titrated.   She presents today for post hospital follow up. She is feeling much better since recent hospitalization. Weight down another 10 lbs. She denies SOB. She has mild lightheadedness with rapid standing, but this is not marked or limiting. She has been taking all medications as directed. Of note, she states that her dad had HF and received an LVAD, after a similar "viral cardiomyopathy" type picture. No genetic screening that she knows of.  She denies CP. She does have occasional palpitations. No chest pain. Denies peripheral edema or abdominal distention.   Review of systems complete and found to be negative unless listed in HPI.    Cath 12/27 Findings: Ao = 99/75 (81) LV = 93/31 RA = 22 RV = 54/25 PA = 58/24 (41) PCW = 30 Fick cardiac output/index = 4.1/1.6 SVR = 1163 PVR = 2.6 FA sat = 89% PA sat = 42%, 44% Assessment: 1. 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 Severe NICM with LVEF ~ 15% 3. Marked volume overload with low output  Past Medical History:  Diagnosis Date  . Arthritis   . CHF (congestive heart failure) ([place])   . COPD (chronic obstructive pulmonary disease) ([place])   . Diabetes mellitus ([place])    type 2   . DVT (deep venous thrombosis) ([place]) dx 10-2016   RLE   . HLD (hyperlipidemia)    on crestor  . Pneumonia    09/2016  . Sleep apnea    per patient ;been dx does not use CPAP does not tolerate   Current Outpatient Medications  Medication Sig Dispense Refill  . amitriptyline (ELAVIL) 50 MG tablet Take 50 mg by mouth at bedtime.  2  . carvedilol (COREG) 3.125 MG tablet Take 1 tablet (3.125 mg total) by mouth 2 (two) times daily with a meal. 60 tablet 0  . CHANTIX CONTINUING MONTH PAK 1 MG tablet Take 1 tablet (1 mg total) by mouth 2 (two) times daily. 60 tablet 0  . cholecalciferol (VITAMIN D3) 25 MCG (1000 UT) tablet Take 3,000 Units by mouth daily.    . ciclopirox (PENLAC) 8 % solution Apply 1 application topically 2 (two) times daily as needed (onychomycosis).   6  . clotrimazole-betamethasone (LOTRISONE) cream Apply 1 application topically 2 (two) times daily as needed (irritation).   3  . cyclobenzaprine (FLEXERIL) 10 MG tablet Take 10 mg by mouth 3 (three) times daily.  1  . digoxin (LANOXIN) 0.125 MG tablet Take 1 tablet (0.125 mg total) by mouth daily. 30 tablet 0  . docusate sodium (COLACE) 100 MG capsule Take 100 mg by mouth 2 (two) times daily.   6  . gabapentin (NEURONTIN) 600 MG tablet Take 600 mg by mouth 4 (four) times daily.   3  . ipratropium-albuterol (DUONEB) 0.5-2.5 (3) MG/3ML SOLN Take 3 mLs by nebulization 2 (two) times daily. 360 mL 0  . losartan (COZAAR) 25 MG tablet Take 1 tablet (25  mg total) by mouth daily. 30 tablet 0  . metFORMIN (GLUCOPHAGE-XR) 500 MG 24 hr tablet Take 500 mg 2 (two) times daily by mouth.   3  . mometasone-formoterol (DULERA) 200-5 MCG/ACT AERO Inhale 2 puffs into the lungs 2 (two) times daily. 2 Inhaler 0  . Multiple Vitamin (MULTIVITAMIN WITH MINERALS) TABS tablet Take 1 tablet by mouth daily.    Marland Kitchen oxyCODONE-acetaminophen (PERCOCET/ROXICET) 5-325 MG tablet Take 1 tablet by mouth 4 (four) times daily.    . OXYCONTIN 10 MG 12 hr tablet Take 10 mg by mouth every 12 (twelve) hours.  0  . polyethylene glycol powder (GLYCOLAX/MIRALAX) powder Take 17 g daily as needed by mouth for mild  constipation. Averages once a month  6  . potassium chloride SA (K-DUR,KLOR-CON) 20 MEQ tablet Take 1 tablet (20 mEq total) by mouth daily. 20 tablet 0  . rosuvastatin (CRESTOR) 10 MG tablet Take 10 mg by mouth at bedtime.  5  . spironolactone (ALDACTONE) 25 MG tablet Take 0.5 tablets (12.5 mg total) by mouth daily. 30 tablet 0  . torsemide (DEMADEX) 20 MG tablet Take 2 tablets (40 mg total) by mouth daily. 30 tablet 0  . XARELTO 20 MG TABS tablet Take 20 mg by mouth daily.  0   No current facility-administered medications for this encounter.    Allergies  Allergen Reactions  . Methocarbamol Anaphylaxis and Swelling  . Tramadol Anaphylaxis    Tolerates Dilaudid, Oxycontin 04/04/16  . Bee Venom Hives   Social History   Socioeconomic History  . Marital status: Married    Spouse name: Not on file  . Number of children: Not on file  . Years of education: Not on file  . Highest education level: Not on file  Occupational History  . Not on file  Social Needs  . Financial resource strain: Not on file  . Food insecurity:    Worry: Not on file    Inability: Not on file  . Transportation needs:    Medical: Not on file    Non-medical: Not on file  Tobacco Use  . Smoking status: Former Smoker    Packs/day: 0.50    Years: 17.00    Pack years: 8.50    Types: Cigarettes    Last attempt to quit: 08/07/2018    Years since quitting: 0.0  . Smokeless tobacco: Never Used  . Tobacco comment: 3 Loose cigarettes in last week  Substance and Sexual Activity  . Alcohol use: No    Comment: seldom   . Drug use: No  . Sexual activity: Not Currently  Lifestyle  . Physical activity:    Days per week: Not on file    Minutes per session: Not on file  . Stress: Not on file  Relationships  . Social connections:    Talks on phone: Not on file    Gets together: Not on file    Attends religious service: Not on file    Active member of club or organization: Not on file    Attends meetings of clubs  or organizations: Not on file    Relationship status: Not on file  . Intimate partner violence:    Fear of current or ex partner: Not on file    Emotionally abused: Not on file    Physically abused: Not on file    Forced sexual activity: Not on file  Other Topics Concern  . Not on file  Social History Narrative  . Not on file  Family History  Problem Relation Age of Onset  . Hypertension Mother   . Hypertension Father   . Hypertension Sister    Vitals:   08/28/18 1212  BP: 98/80  Pulse: (!) 56  SpO2: 99%  Weight: 133.4 kg (294 lb)    Wt Readings from Last 3 Encounters:  08/28/18 133.4 kg (294 lb)  08/14/18 (!) 138.2 kg (304 lb 9.6 oz)  07/22/18 (!) 149.7 kg (330 lb)    PHYSICAL EXAM: General:  Well appearing. No respiratory difficulty HEENT: Normal Neck: Supple. JVP difficult, but does not appear elevated. Carotids 2+ bilat; no bruits. No lymphadenopathy or thyromegaly appreciated. Cor: PMI nondisplaced. Irregular due to ectopy. No rubs, gallops or murmurs. Lungs: Clear Abdomen: Soft, nontender, nondistended. No hepatosplenomegaly. No bruits or masses. Good bowel sounds. Extremities: No cyanosis, clubbing, or rash. Trace ankle edema.  Neuro: Alert & oriented x 3, cranial nerves grossly intact. moves all 4 extremities w/o difficulty. Affect pleasant.   ECG: Sinus tach 104 bpm with PVCs, personally reviewed.   ASSESSMENT & PLAN:  1. Chronic systolic HF due to NICM.Unclear etiology. ?viral illness in October vs CPAP. Also noncompliant with CPAP. A1C only 7.2. HIV negative. Has HTN, but seems well controlled.   - Echo 12/25: EF 15-20%, severe diffuse HK and septal dyskinesis, grade 2 DD, mod MR, LA moderately dilated, RV moderately dilated with moderately reduced function, PA peak pressure 35 mm Hg.  - R/LHC 12/27 showed marked volume overload with low output, severe NICM with EF 15%, and minimal non-obstructive CAD.   - NYHA II-III symptoms - Volume status looks OK on  exam.   - Continue torsemide 40 mg daily. For now.  - Continue digoxin 0.125 daily.  - Continue Coreg 3.125 mg BID. - Continue losartan 25 daily. Move to bedtime. No room for Entresto.  - Continue spiro 12.5 mg daily. No room to increase.  - She is on Depo injections for birth control. Pregnancy test negative on admission. - Discussed with Dr. Haroldine Laws, at next visit, consider referral to Dr. Broadus John for genetic screening with strong family history of NICM cardiomyopathy. (Her father, and his mother) 2. Minimal nonobstructive CAD:  - LHC 12/27: 30% Prox LAD - No s/s of ischemia.    - Continue crestor.  - No ASA with Xarelto use 3. DVT - Continue Xarelto 20 mg daily. Denies bleeding.  4. OSA:  - Encouraged to keep appt for sleep study scheduled for 1/21. They are discussing need for BIPAP [facility]) 5. COPD:  - Encouraged complete cessation.  - Continue chantix.  6. NSVT:  - Quiescent off milrinone. BB as above.  - Occasional PVCs on exam. Electrolytes drawn today.  7. AKI:  - Creatinine 1.19 > 1.51 > 1.0.  - BMET today.   Doing well overall, but BP too low for med titration. Discussed disease state and progression. She is very familiar as her father was an LVAD patient. RTC 2-3 weeks for med titration. Will plan Echo in April on GDMT.   Shirley Friar, PA-C 08/28/18   Greater than 50% of the 25 minute visit was spent in counseling/coordination of care regarding disease state education, salt/fluid restriction, sliding scale diuretics, and medication compliance.

## 2018-08-28 ENCOUNTER — Other Ambulatory Visit: Payer: Self-pay

## 2018-08-28 ENCOUNTER — Encounter (HOSPITAL_COMMUNITY): Payer: Self-pay

## 2018-08-28 ENCOUNTER — Ambulatory Visit (HOSPITAL_COMMUNITY)
Admission: RE | Admit: 2018-08-28 | Discharge: 2018-08-28 | Disposition: A | Discharge status: Home / Self Care | Payer: BC Managed Care – PPO | Source: Ambulatory Visit | Attending: Cardiology | Admitting: Cardiology

## 2018-08-28 VITALS — BP 98/80 | HR 56 | Wt 294.0 lb

## 2018-08-28 DIAGNOSIS — I110 Hypertensive heart disease with heart failure: Secondary | ICD-10-CM | POA: Insufficient documentation

## 2018-08-28 DIAGNOSIS — Z888 Allergy status to other drugs, medicaments and biological substances status: Secondary | ICD-10-CM | POA: Insufficient documentation

## 2018-08-28 DIAGNOSIS — Z885 Allergy status to narcotic agent status: Secondary | ICD-10-CM | POA: Diagnosis not present

## 2018-08-28 DIAGNOSIS — Z7984 Long term (current) use of oral hypoglycemic drugs: Secondary | ICD-10-CM | POA: Insufficient documentation

## 2018-08-28 DIAGNOSIS — I472 Ventricular tachycardia: Secondary | ICD-10-CM | POA: Diagnosis not present

## 2018-08-28 DIAGNOSIS — Z96651 Presence of right artificial knee joint: Secondary | ICD-10-CM | POA: Diagnosis not present

## 2018-08-28 DIAGNOSIS — I5022 Chronic systolic (congestive) heart failure: Secondary | ICD-10-CM | POA: Insufficient documentation

## 2018-08-28 DIAGNOSIS — Z8249 Family history of ischemic heart disease and other diseases of the circulatory system: Secondary | ICD-10-CM | POA: Insufficient documentation

## 2018-08-28 DIAGNOSIS — I82509 Chronic embolism and thrombosis of unspecified deep veins of unspecified lower extremity: Secondary | ICD-10-CM | POA: Diagnosis not present

## 2018-08-28 DIAGNOSIS — Z86718 Personal history of other venous thrombosis and embolism: Secondary | ICD-10-CM

## 2018-08-28 DIAGNOSIS — J449 Chronic obstructive pulmonary disease, unspecified: Secondary | ICD-10-CM | POA: Insufficient documentation

## 2018-08-28 DIAGNOSIS — Z7901 Long term (current) use of anticoagulants: Secondary | ICD-10-CM | POA: Insufficient documentation

## 2018-08-28 DIAGNOSIS — G4733 Obstructive sleep apnea (adult) (pediatric): Secondary | ICD-10-CM

## 2018-08-28 DIAGNOSIS — Z9119 Patient's noncompliance with other medical treatment and regimen: Secondary | ICD-10-CM | POA: Insufficient documentation

## 2018-08-28 DIAGNOSIS — Z87891 Personal history of nicotine dependence: Secondary | ICD-10-CM | POA: Insufficient documentation

## 2018-08-28 DIAGNOSIS — Z79899 Other long term (current) drug therapy: Secondary | ICD-10-CM | POA: Diagnosis not present

## 2018-08-28 DIAGNOSIS — I428 Other cardiomyopathies: Secondary | ICD-10-CM | POA: Diagnosis not present

## 2018-08-28 DIAGNOSIS — N179 Acute kidney failure, unspecified: Secondary | ICD-10-CM | POA: Diagnosis not present

## 2018-08-28 DIAGNOSIS — E119 Type 2 diabetes mellitus without complications: Secondary | ICD-10-CM | POA: Diagnosis not present

## 2018-08-28 DIAGNOSIS — I2510 Atherosclerotic heart disease of native coronary artery without angina pectoris: Secondary | ICD-10-CM | POA: Insufficient documentation

## 2018-08-28 DIAGNOSIS — I509 Heart failure, unspecified: Secondary | ICD-10-CM | POA: Diagnosis not present

## 2018-08-28 DIAGNOSIS — E785 Hyperlipidemia, unspecified: Secondary | ICD-10-CM | POA: Diagnosis not present

## 2018-08-28 LAB — BASIC METABOLIC PANEL
Anion gap: 12 (ref 5–15)
BUN: 25 mg/dL — ABNORMAL HIGH (ref 6–20)
CO2: 19 mmol/L — ABNORMAL LOW (ref 22–32)
Calcium: 9.7 mg/dL (ref 8.9–10.3)
Chloride: 105 mmol/L (ref 98–111)
Creatinine, Ser: 1.22 mg/dL — ABNORMAL HIGH (ref 0.44–1.00)
GFR calc Af Amer: >60 mL/min (ref >60)
GFR calc non Af Amer: 55 mL/min — ABNORMAL LOW (ref >60)
Glucose, Bld: 116 mg/dL — ABNORMAL HIGH (ref 70–99)
Potassium: 4.6 mmol/L (ref 3.5–5.1)
Sodium: 136 mmol/L (ref 135–145)

## 2018-08-28 LAB — DIGOXIN LEVEL: Digoxin Level: 0.5 ng/mL — ABNORMAL LOW (ref 0.8–2.0)

## 2018-08-28 MED ORDER — XARELTO 20 MG PO TABS: 20.0000 mg | ORAL_TABLET | Freq: Every day | ORAL | 0 refills | Status: DC

## 2018-08-28 MED ORDER — POTASSIUM CHLORIDE CRYS ER 20 MEQ PO TBCR: 20.0000 meq | EXTENDED_RELEASE_TABLET | Freq: Every day | ORAL | 0 refills | Status: DC

## 2018-08-28 MED ORDER — CARVEDILOL 3.125 MG PO TABS: 3.1250 mg | ORAL_TABLET | Freq: Two times a day (BID) | ORAL | 0 refills | Status: DC

## 2018-08-28 MED ORDER — SPIRONOLACTONE 25 MG PO TABS: 12.5000 mg | ORAL_TABLET | Freq: Every day | ORAL | 0 refills | Status: DC

## 2018-08-28 MED ORDER — TORSEMIDE 20 MG PO TABS: 40.0000 mg | ORAL_TABLET | Freq: Every day | ORAL | 0 refills | Status: DC

## 2018-08-28 MED ORDER — AMITRIPTYLINE HCL 50 MG PO TABS: 50.0000 mg | ORAL_TABLET | Freq: Every day | ORAL | 2 refills | Status: AC

## 2018-08-28 MED ORDER — ROSUVASTATIN CALCIUM 10 MG PO TABS: 10.0000 mg | ORAL_TABLET | Freq: Every day | ORAL | 5 refills | Status: AC

## 2018-08-28 MED ORDER — LOSARTAN POTASSIUM 25 MG PO TABS: 25.0000 mg | ORAL_TABLET | Freq: Every day | ORAL | 0 refills | Status: DC

## 2018-08-28 MED ORDER — DIGOXIN 125 MCG PO TABS: 0.1250 mg | ORAL_TABLET | Freq: Every day | ORAL | 0 refills | Status: DC

## 2018-08-28 NOTE — Patient Instructions (Addendum)
Labs done today  EKG done today  REFILLED all cardiac medication  CHANGE Spironolactone 12.17m (0.5 tab) daily at bedtime  CHANGE Losartan 213m(1 tab) daily at bedtime  Follow up with the Advanced Practice Providers in 2-3 weeks.

## 2018-09-05 ENCOUNTER — Telehealth (HOSPITAL_COMMUNITY): Payer: Self-pay

## 2018-09-05 NOTE — Telephone Encounter (Signed)
Pt called to report weight gaining, SOB, edema in hands and ankles.   Weight:  1/17 289.3 1/18 293.8 1/19 298 1/20 293.5 1/21 295.6 1/22 299.2  Pt is currently taking torsemide 20 mg (Take 2 tablets (40 mg total) by mouth daily). Please advise.

## 2018-09-05 NOTE — Telephone Encounter (Signed)
Take torsemide 40 mg BID x 2 days with an extra 20 meq of K.     Legrand Como [address] [place], PA-C 09/05/2018 11:57 AM

## 2018-09-05 NOTE — Telephone Encounter (Signed)
Pt notified Verbalizes understanding 

## 2018-09-08 ENCOUNTER — Other Ambulatory Visit (HOSPITAL_COMMUNITY): Payer: Self-pay | Admitting: Student

## 2018-09-11 ENCOUNTER — Other Ambulatory Visit (HOSPITAL_COMMUNITY): Payer: Self-pay | Admitting: Student

## 2018-09-18 ENCOUNTER — Encounter (HOSPITAL_COMMUNITY): Payer: Self-pay

## 2018-09-18 ENCOUNTER — Ambulatory Visit (HOSPITAL_COMMUNITY)
Admission: RE | Admit: 2018-09-18 | Discharge: 2018-09-18 | Disposition: A | Discharge status: Home / Self Care | Payer: 59 | Source: Ambulatory Visit | Attending: Cardiology | Admitting: Cardiology

## 2018-09-18 ENCOUNTER — Other Ambulatory Visit: Payer: Self-pay

## 2018-09-18 VITALS — BP 132/88 | HR 107 | Wt 295.8 lb

## 2018-09-18 DIAGNOSIS — Z87891 Personal history of nicotine dependence: Secondary | ICD-10-CM | POA: Diagnosis not present

## 2018-09-18 DIAGNOSIS — I2510 Atherosclerotic heart disease of native coronary artery without angina pectoris: Secondary | ICD-10-CM | POA: Diagnosis not present

## 2018-09-18 DIAGNOSIS — G4733 Obstructive sleep apnea (adult) (pediatric): Secondary | ICD-10-CM | POA: Insufficient documentation

## 2018-09-18 DIAGNOSIS — Z885 Allergy status to narcotic agent status: Secondary | ICD-10-CM | POA: Insufficient documentation

## 2018-09-18 DIAGNOSIS — Z9119 Patient's noncompliance with other medical treatment and regimen: Secondary | ICD-10-CM | POA: Insufficient documentation

## 2018-09-18 DIAGNOSIS — I428 Other cardiomyopathies: Secondary | ICD-10-CM | POA: Diagnosis not present

## 2018-09-18 DIAGNOSIS — Z888 Allergy status to other drugs, medicaments and biological substances status: Secondary | ICD-10-CM | POA: Insufficient documentation

## 2018-09-18 DIAGNOSIS — I5022 Chronic systolic (congestive) heart failure: Secondary | ICD-10-CM | POA: Diagnosis present

## 2018-09-18 DIAGNOSIS — Z86718 Personal history of other venous thrombosis and embolism: Secondary | ICD-10-CM | POA: Diagnosis not present

## 2018-09-18 DIAGNOSIS — E785 Hyperlipidemia, unspecified: Secondary | ICD-10-CM | POA: Insufficient documentation

## 2018-09-18 DIAGNOSIS — Z79899 Other long term (current) drug therapy: Secondary | ICD-10-CM | POA: Insufficient documentation

## 2018-09-18 DIAGNOSIS — Z96651 Presence of right artificial knee joint: Secondary | ICD-10-CM | POA: Diagnosis not present

## 2018-09-18 DIAGNOSIS — Z7901 Long term (current) use of anticoagulants: Secondary | ICD-10-CM | POA: Insufficient documentation

## 2018-09-18 DIAGNOSIS — E119 Type 2 diabetes mellitus without complications: Secondary | ICD-10-CM | POA: Diagnosis not present

## 2018-09-18 DIAGNOSIS — Z8249 Family history of ischemic heart disease and other diseases of the circulatory system: Secondary | ICD-10-CM | POA: Insufficient documentation

## 2018-09-18 DIAGNOSIS — Z7984 Long term (current) use of oral hypoglycemic drugs: Secondary | ICD-10-CM | POA: Insufficient documentation

## 2018-09-18 DIAGNOSIS — I472 Ventricular tachycardia: Secondary | ICD-10-CM | POA: Diagnosis not present

## 2018-09-18 DIAGNOSIS — I110 Hypertensive heart disease with heart failure: Secondary | ICD-10-CM | POA: Diagnosis not present

## 2018-09-18 DIAGNOSIS — M1990 Unspecified osteoarthritis, unspecified site: Secondary | ICD-10-CM | POA: Diagnosis not present

## 2018-09-18 DIAGNOSIS — J449 Chronic obstructive pulmonary disease, unspecified: Secondary | ICD-10-CM | POA: Insufficient documentation

## 2018-09-18 DIAGNOSIS — I509 Heart failure, unspecified: Secondary | ICD-10-CM

## 2018-09-18 MED ORDER — SACUBITRIL-VALSARTAN 24-26 MG PO TABS: 1.0000 | ORAL_TABLET | Freq: Two times a day (BID) | ORAL | 6 refills | Status: DC

## 2018-09-18 NOTE — Progress Notes (Signed)
Advanced Heart Failure Clinic Note   Referring Physician: PCP: [place] PCP-Cardiologist: No primary care provider on file.   HPI: Anna Rowe is a 43 y.o. female with a history of hyperlipidemia, DM, COPD, sleep apnea noncompliant with CPAP, chronic DVT (diagnosed 2018) on Xarelto, tobacco use, right knee replacement 2015, and COPD.She has had RTKR and R/L hip replacements.    Of note, she states that her dad had HF and received an LVAD, after a similar "viral cardiomyopathy" type picture  Admitted to [facility] 08/08/18 - 08/15/2018 with worsening LE edema. Echo showed newly reduced EF 15-20%. [facility] 12/27 showed marked volume overload and low output and minimal nonobstructive CAD. Diuresed on milrinone, and weaned off as tolerated as medications titrated.   Today she returns for HF follow up.  Overall feeling fair. SOB with exertion. Ongoing leg pain. Complaining of cough at night. Denies PND/Orthopnea. Appetite ok. Eats bacon everyday and salads with ranch dressing and cheese. Drinkin g> 2 liters per day. No fever or chills. Weight at home 290-302 pounds. SBP 107-140.  Taking all medications but ran out of losartan 1 week ago. Lives with her husband.  Cath 12/27 Findings: Ao = 99/75 (81) LV = 93/31 RA = 22 RV = 54/25 PA = 58/24 (41) PCW = 30 Fick cardiac output/index = 4.1/1.6 SVR = 1163 PVR = 2.6 FA sat = 89% PA sat = 42%, 44% Assessment: 1. 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 Severe NICM with LVEF ~ 15% 3. Marked volume overload with low output  Past Medical History:  Diagnosis Date  . Arthritis   . CHF (congestive heart failure) ([place])   . COPD (chronic obstructive pulmonary disease) ([place])   . Diabetes mellitus ([place])    type 2   . DVT (deep venous thrombosis) ([place]) dx 10-2016   RLE   . HLD (hyperlipidemia)    on crestor  . Pneumonia    09/2016  . Sleep apnea    per patient ;been dx does not use CPAP does not tolerate   Current  Outpatient Medications  Medication Sig Dispense Refill  . amitriptyline (ELAVIL) 50 MG tablet Take 1 tablet (50 mg total) by mouth at bedtime. 30 tablet 2  . carvedilol (COREG) 3.125 MG tablet Take 1 tablet (3.125 mg total) by mouth 2 (two) times daily with a meal. 60 tablet 0  . CHANTIX CONTINUING MONTH PAK 1 MG tablet Take 1 tablet (1 mg total) by mouth 2 (two) times daily. 60 tablet 0  . cholecalciferol (VITAMIN D3) 25 MCG (1000 UT) tablet Take 3,000 Units by mouth daily.    . ciclopirox (PENLAC) 8 % solution Apply 1 application topically 2 (two) times daily as needed (onychomycosis).   6  . cyclobenzaprine (FLEXERIL) 10 MG tablet Take 10 mg by mouth 3 (three) times daily.  1  . digoxin (LANOXIN) 0.125 MG tablet Take 1 tablet (0.125 mg total) by mouth daily. 30 tablet 0  . docusate sodium (COLACE) 100 MG capsule Take 100 mg by mouth 2 (two) times daily.   6  . gabapentin (NEURONTIN) 600 MG tablet Take 600 mg by mouth 4 (four) times daily.   3  . ipratropium-albuterol (DUONEB) 0.5-2.5 (3) MG/3ML SOLN Take 3 mLs by nebulization 2 (two) times daily. 360 mL 0  . metFORMIN (GLUCOPHAGE-XR) 500 MG 24 hr tablet Take 500 mg 2 (two) times daily by mouth.   3  . mometasone-formoterol (DULERA) 200-5 MCG/ACT AERO Inhale 2 puffs into the lungs 2 (  two) times daily. 2 Inhaler 0  . Multiple Vitamin (MULTIVITAMIN WITH MINERALS) TABS tablet Take 1 tablet by mouth daily.    Marland Kitchen oxyCODONE-acetaminophen (PERCOCET/ROXICET) 5-325 MG tablet Take 1 tablet by mouth 4 (four) times daily.    . OXYCONTIN 10 MG 12 hr tablet Take 10 mg by mouth every 12 (twelve) hours.  0  . polyethylene glycol powder (GLYCOLAX/MIRALAX) powder Take 17 g daily as needed by mouth for mild constipation. Averages once a month  6  . potassium chloride SA (K-DUR,KLOR-CON) 20 MEQ tablet Take 20 mEq by mouth 2 (two) times daily.    . rosuvastatin (CRESTOR) 10 MG tablet Take 1 tablet (10 mg total) by mouth at bedtime. 30 tablet 5  . spironolactone  (ALDACTONE) 25 MG tablet Take 0.5 tablets (12.5 mg total) by mouth at bedtime. 30 tablet 0  . torsemide (DEMADEX) 20 MG tablet TAKE 2 TABLETS BY MOUTH EVERY DAY 30 tablet 0  . XARELTO 20 MG TABS tablet Take 1 tablet (20 mg total) by mouth daily. 30 tablet 0  . clotrimazole-betamethasone (LOTRISONE) cream Apply 1 application topically 2 (two) times daily as needed (irritation).   3   No current facility-administered medications for this encounter.    Allergies  Allergen Reactions  . Methocarbamol Anaphylaxis and Swelling  . Tramadol Anaphylaxis    Tolerates Dilaudid, Oxycontin 04/04/16  . Bee Venom Hives   Social History   Socioeconomic History  . Marital status: Married    Spouse name: Not on file  . Number of children: Not on file  . Years of education: Not on file  . Highest education level: Not on file  Occupational History  . Not on file  Social Needs  . Financial resource strain: Not on file  . Food insecurity:    Worry: Not on file    Inability: Not on file  . Transportation needs:    Medical: Not on file    Non-medical: Not on file  Tobacco Use  . Smoking status: Former Smoker    Packs/day: 0.50    Years: 17.00    Pack years: 8.50    Types: Cigarettes    Last attempt to quit: 08/07/2018    Years since quitting: 0.1  . Smokeless tobacco: Never Used  . Tobacco comment: 3 Loose cigarettes in last week  Substance and Sexual Activity  . Alcohol use: No    Comment: seldom   . Drug use: No  . Sexual activity: Not Currently  Lifestyle  . Physical activity:    Days per week: Not on file    Minutes per session: Not on file  . Stress: Not on file  Relationships  . Social connections:    Talks on phone: Not on file    Gets together: Not on file    Attends religious service: Not on file    Active member of club or organization: Not on file    Attends meetings of clubs or organizations: Not on file    Relationship status: Not on file  . Intimate partner violence:      Fear of current or ex partner: Not on file    Emotionally abused: Not on file    Physically abused: Not on file    Forced sexual activity: Not on file  Other Topics Concern  . Not on file  Social History Narrative  . Not on file   Family History  Problem Relation Age of Onset  . Hypertension Mother   . Hypertension  Father   . Hypertension Sister    Vitals:   09/18/18 1148  BP: 132/88  Pulse: (!) 107  SpO2: 100%  Weight: 134.2 kg (295 lb 12.8 oz)    Wt Readings from Last 3 Encounters:  09/18/18 134.2 kg (295 lb 12.8 oz)  08/28/18 133.4 kg (294 lb)  08/14/18 (!) 138.2 kg (304 lb 9.6 oz)    PHYSICAL EXAM: General:  Appears chronically ill.  No resp difficulty. Ambulated in clinic with a walker.  HEENT: normal Neck: supple. JVP hard to assess due body habitus.  Carotids 2+ bilat; no bruits. No lymphadenopathy or thryomegaly appreciated. Cor: PMI nondisplaced. Tachy Regular rate & rhythm. No rubs, or murmurs. + S3  Lungs: clear Abdomen: soft, nontender, nondistended. No hepatosplenomegaly. No bruits or masses. Good bowel sounds. Extremities: no cyanosis, clubbing, rash, edema Neuro: alert & orientedx3, cranial nerves grossly intact. moves all 4 extremities w/o difficulty. Affect pleasant  EKG : Sinus Tach 101 bpm   ASSESSMENT & PLAN:  1. Chronic systolic HF due to NICM.Unclear etiology. ?viral illness in October vs CPAP. Also noncompliant with CPAP. A1C only 7.2. HIV negative. Has HTN.   - Echo 12/25: EF 15-20%, severe diffuse HK and septal dyskinesis, grade 2 DD, mod MR, LA moderately dilated, RV moderately dilated with moderately reduced function, PA peak pressure 35 mm Hg.  - R/LHC 12/27 showed marked volume overload with low output, severe NICM with EF 15%, and minimal non-obstructive CAD. She has had a hysterectomy .  No MRI with hip replacements.  NYHA IIIb.   Volume status mildly elevated. Continue torsemide 40 mg daily.  - Continue digoxin 0.125 daily, dig level  0.5 on 1/14  - Continue Coreg 3.125 mg BID. - Stop losartan and start enetresto 24-26 mg twice a day.    - Continue spiro 12.5 mg daily. - Check BMET in 7 days,  -  Dr. Broadus John for genetic screening with strong family history of NICM cardiomyopathy. (Her father, and his mother) 2. Minimal nonobstructive CAD:  - LHC 12/27: 30% Prox LAD -no s/s ischemai.   - Continue crestor.  - No ASA with Xarelto use 3. DVT - Continue Xarelto 20 mg daily.  4. OSA:  CPAP currently not working. [place] working on getting Bipap  5. COPD:  - Continue chantix.  6. NSVT  Plan on repeat ECHO the end of March first of April. Refer to HF Paramedicine. I would like to follow closer in the community.    Follow up in 2 weeks or sooner is she decompensates.   Darrick Grinder, NP 09/18/18

## 2018-09-18 NOTE — Patient Instructions (Signed)
STOP Losartan  START Entresto 24-26 (1 tab) twice daily  Labs will need to be done in 7 days.  You have been referred to our Paramedicine program. They will contact you in order to set up appointments.  Follow up with the Advanced Practice Provider in 2 weeks

## 2018-09-24 ENCOUNTER — Other Ambulatory Visit (HOSPITAL_COMMUNITY): Payer: Self-pay | Admitting: Student

## 2018-09-25 ENCOUNTER — Ambulatory Visit (HOSPITAL_COMMUNITY)
Admission: RE | Admit: 2018-09-25 | Discharge: 2018-09-25 | Disposition: A | Discharge status: Home / Self Care | Payer: 59 | Source: Ambulatory Visit | Attending: Cardiology | Admitting: Cardiology

## 2018-09-25 DIAGNOSIS — I509 Heart failure, unspecified: Secondary | ICD-10-CM | POA: Insufficient documentation

## 2018-09-25 LAB — BASIC METABOLIC PANEL
Anion gap: 11 (ref 5–15)
BUN: 15 mg/dL (ref 6–20)
CO2: 25 mmol/L (ref 22–32)
Calcium: 9.4 mg/dL (ref 8.9–10.3)
Chloride: 102 mmol/L (ref 98–111)
Creatinine, Ser: 1.07 mg/dL — ABNORMAL HIGH (ref 0.44–1.00)
GFR calc Af Amer: >60 mL/min (ref >60)
GFR calc non Af Amer: >60 mL/min (ref >60)
Glucose, Bld: 133 mg/dL — ABNORMAL HIGH (ref 70–99)
Potassium: 4.6 mmol/L (ref 3.5–5.1)
Sodium: 138 mmol/L (ref 135–145)

## 2018-09-26 ENCOUNTER — Other Ambulatory Visit (HOSPITAL_COMMUNITY): Payer: Self-pay | Admitting: Internal Medicine

## 2018-09-26 ENCOUNTER — Other Ambulatory Visit (HOSPITAL_COMMUNITY): Payer: Self-pay | Admitting: Student

## 2018-10-02 NOTE — Progress Notes (Signed)
Advanced Heart Failure Clinic Note   PCP: [facility] PCP-Cardiologist: Dr Haroldine Laws  HPI: Anna Rowe is a 43 y.o. female with a history of hyperlipidemia, DM, COPD, sleep apnea noncompliant with CPAP, chronic DVT (diagnosed 2018) on Xarelto, tobacco use, right knee replacement 2015, and COPD.She has had RTKR and R/L hip replacements.   Of note, she states that her dad had HF and received an LVAD, after a similar "viral cardiomyopathy" type picture  Admitted to [facility] 08/08/18 - 08/15/2018 with worsening LE edema. Echo showed newly reduced EF 15-20%. [facility] 12/27 showed marked volume overload and low output and minimal nonobstructive CAD. Diuresed on milrinone, and weaned off as tolerated as medications titrated.   She returns today for HF follow up. Last visit losartan was stopped and Entresto was started. Repeat BMET showed stable creatinine at 1.07. She was also referred to paramedicine. Overall doing okay. She is SOB on exertion when moving around the house. Does not to go to the store. No LE edema, some in her fingers. No orthopnea or PND. +dry cough. +orthopnea at baseline. Less urination after starting Entresto. She has also had a HA since she started taking. No bleeding on Xarelto. No dizziness. She is now wearing CPAP qHS. Her PCP ordered her a BiPAP machine yesterday. Having problems with constipation on pain medications. Weight up 4 lbs. Taking all medication, but did not take lasix this am. Limits fluid <1.8 L daily. SBP generally 130-140s, but a few 90s at home.   Cath 12/27 Findings: Ao = 99/75 (81) LV = 93/31 RA = 22 RV = 54/25 PA = 58/24 (41) PCW = 30 Fick cardiac output/index = 4.1/1.6 SVR = 1163 PVR = 2.6 FA sat = 89% PA sat = 42%, 44% Assessment: 1. 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 Severe NICM with LVEF ~ 15% 3. Marked volume overload with low output  Review of systems complete and found to be negative unless listed in HPI.    Past Medical History:  Diagnosis Date  . Arthritis   . CHF (congestive heart failure) ([place])   . COPD (chronic obstructive pulmonary disease) ([place])   . Diabetes mellitus ([place])    type 2   . DVT (deep venous thrombosis) ([place]) dx 10-2016   RLE   . HLD (hyperlipidemia)    on crestor  . Pneumonia    09/2016  . Sleep apnea    per patient ;been dx does not use CPAP does not tolerate   Current Outpatient Medications  Medication Sig Dispense Refill  . amitriptyline (ELAVIL) 50 MG tablet Take 1 tablet (50 mg total) by mouth at bedtime. 30 tablet 2  . carvedilol (COREG) 3.125 MG tablet Take 1 tablet (3.125 mg total) by mouth 2 (two) times daily with a meal. 60 tablet 0  . CHANTIX CONTINUING MONTH PAK 1 MG tablet Take 1 tablet (1 mg total) by mouth 2 (two) times daily. 60 tablet 0  . cholecalciferol (VITAMIN D3) 25 MCG (1000 UT) tablet Take 3,000 Units by mouth daily.    . cyclobenzaprine (FLEXERIL) 10 MG tablet Take 10 mg by mouth 3 (three) times daily.  1  . digoxin (LANOXIN) 0.125 MG tablet Take 1 tablet (0.125 mg total) by mouth daily. 30 tablet 0  . docusate sodium (COLACE) 100 MG capsule Take 100 mg by mouth 2 (two) times daily.   6  . gabapentin (NEURONTIN) 600 MG tablet Take 600 mg by mouth 4 (four) times daily.   3  .  ipratropium-albuterol (DUONEB) 0.5-2.5 (3) MG/3ML SOLN Take 3 mLs by nebulization 2 (two) times daily. 360 mL 0  . Meloxicam 15 MG TBDP Take 15 mg by mouth daily.    . metFORMIN (GLUCOPHAGE-XR) 500 MG 24 hr tablet Take 500 mg 2 (two) times daily by mouth.   3  . mometasone-formoterol (DULERA) 200-5 MCG/ACT AERO Inhale 2 puffs into the lungs 2 (two) times daily. 2 Inhaler 0  . Multiple Vitamin (MULTIVITAMIN WITH MINERALS) TABS tablet Take 1 tablet by mouth daily.    Marland Kitchen oxyCODONE-acetaminophen (PERCOCET/ROXICET) 5-325 MG tablet Take 1 tablet by mouth 4 (four) times daily.    . OXYCONTIN 10 MG 12 hr tablet Take 10 mg by mouth every 12 (twelve) hours.  0  . potassium chloride  SA (K-DUR,KLOR-CON) 20 MEQ tablet Take 20 mEq by mouth 2 (two) times daily.    . rosuvastatin (CRESTOR) 10 MG tablet Take 1 tablet (10 mg total) by mouth at bedtime. 30 tablet 5  . sacubitril-valsartan (ENTRESTO) 24-26 MG Take 1 tablet by mouth 2 (two) times daily. 60 tablet 6  . spironolactone (ALDACTONE) 25 MG tablet Take 0.5 tablets (12.5 mg total) by mouth at bedtime. 30 tablet 0  . torsemide (DEMADEX) 20 MG tablet TAKE 2 TABLETS BY MOUTH EVERY DAY 30 tablet 0  . XARELTO 20 MG TABS tablet Take 1 tablet (20 mg total) by mouth daily. 30 tablet 0   No current facility-administered medications for this encounter.    Allergies  Allergen Reactions  . Methocarbamol Anaphylaxis and Swelling  . Tramadol Anaphylaxis    Tolerates Dilaudid, Oxycontin 04/04/16  . Bee Venom Hives   Social History   Socioeconomic History  . Marital status: Married    Spouse name: Not on file  . Number of children: Not on file  . Years of education: Not on file  . Highest education level: Not on file  Occupational History  . Not on file  Social Needs  . Financial resource strain: Not on file  . Food insecurity:    Worry: Not on file    Inability: Not on file  . Transportation needs:    Medical: Not on file    Non-medical: Not on file  Tobacco Use  . Smoking status: Former Smoker    Packs/day: 0.50    Years: 17.00    Pack years: 8.50    Types: Cigarettes    Last attempt to quit: 08/07/2018    Years since quitting: 0.1  . Smokeless tobacco: Never Used  . Tobacco comment: 3 Loose cigarettes in last week  Substance and Sexual Activity  . Alcohol use: No    Comment: seldom   . Drug use: No  . Sexual activity: Not Currently  Lifestyle  . Physical activity:    Days per week: Not on file    Minutes per session: Not on file  . Stress: Not on file  Relationships  . Social connections:    Talks on phone: Not on file    Gets together: Not on file    Attends religious service: Not on file    Active  member of club or organization: Not on file    Attends meetings of clubs or organizations: Not on file    Relationship status: Not on file  . Intimate partner violence:    Fear of current or ex partner: Not on file    Emotionally abused: Not on file    Physically abused: Not on file    Forced  sexual activity: Not on file  Other Topics Concern  . Not on file  Social History Narrative  . Not on file   Family History  Problem Relation Age of Onset  . Hypertension Mother   . Hypertension Father   . Hypertension Sister    Vitals:   10/03/18 1134  BP: (!) 130/92  Pulse: 98  SpO2: 97%  Weight: 135.8 kg (299 lb 6.4 oz)    Wt Readings from Last 3 Encounters:  10/03/18 135.8 kg (299 lb 6.4 oz)  09/18/18 134.2 kg (295 lb 12.8 oz)  08/28/18 133.4 kg (294 lb)    PHYSICAL EXAM: General: Appears chronically ill. No resp difficulty. HEENT: Normal Neck: Supple. JVP 5-6. Carotids 2+ bilat; no bruits. No thyromegaly or nodule noted. Cor: PMI nondisplaced. Tachy, regular, +s3 Lungs: CTAB, normal effort. Abdomen: Soft, non-tender, non-distended, no HSM. No bruits or masses. +BS  Extremities: No cyanosis, clubbing, or rash. R and LLE no edema.  Neuro: Alert & orientedx3, cranial nerves grossly intact. moves all 4 extremities w/o difficulty. Affect pleasant   ASSESSMENT & PLAN:  1. Chronic systolic HF due to NICM.Unclear etiology. ?viral illness in October vs CPAP. Also noncompliant with CPAP. A1C only 7.2. HIV negative. Has HTN.   - Echo 12/25: EF 15-20%, severe diffuse HK and septal dyskinesis, grade 2 DD, mod MR, LA moderately dilated, RV moderately dilated with moderately reduced function, PA peak pressure 35 mm Hg.  - R/LHC 12/27 showed marked volume overload with low output, severe NICM with EF 15%, and minimal non-obstructive CAD. - She has had a hysterectomy, so no need for birth control.  - No MRI with hip replacements.   - NYHA IIIb. Volume status okay on exam. ReDS Clip  34% - Continue torsemide 40 mg daily.  - Continue digoxin 0.125 daily, dig level 0.5 on 1/14  - Continue Coreg 3.125 mg BID. Will not increase with low output on recent cath.  - Increase entresto 49/51 mg twice a day. BMET stable after increase on 2/11. Check BMET in 10-14 days. She will monitor BP at home and let us know if she becomes dizzy or has readings of SBP <90. - Continue spiro 12.5 mg daily. - Refer to Dr. Broadus John for genetic screening with strong family history of NICM cardiomyopathy. (Her father has an LVAD and his mother had CHF) - Repeat echo in 8 weeks with Dr Haroldine Laws.  - Refer to HF paramedicine - I do not know if she would be able to do a CPX test even on the bike with her joint problems (bilat hips and right knee).  - We discussed possibility of advanced therapies if her EF does not improve.  2. Minimal nonobstructive CAD:  - LHC 12/27: 30% Prox LAD - No s/s ischemia - Continue crestor.  - No ASA with Xarelto use 3. DVT - Continue Xarelto 20 mg daily. Denies bleeding.  4. OSA:  - On CPAP daily. [facility] working on [employer]. Her PCP ordered yesterday.  5. COPD:  - Continue chantix. No change.  6. NSVT - Occurred while on milrinone inpatient.  7. HTN - Increase Entresto as above.   Refer to Dr Broadus John for genetic testing Refer to HF paramedicine - she is in [facility], but still [facility] as above. BMET in 10-14 days.  Follow up in 4 weeks with APP.   Schedule echo in ~8 weeks with Dr Haroldine Laws.   Georgiana Shore, NP 10/03/18   Greater than  50% of the 35 minute visit was spent in counseling/coordination of care regarding disease state education, salt/fluid restriction, sliding scale diuretics, and medication compliance.

## 2018-10-03 ENCOUNTER — Other Ambulatory Visit: Payer: Self-pay

## 2018-10-03 ENCOUNTER — Ambulatory Visit (HOSPITAL_COMMUNITY)
Admission: RE | Admit: 2018-10-03 | Discharge: 2018-10-03 | Disposition: A | Discharge status: Home / Self Care | Payer: 59 | Source: Ambulatory Visit | Attending: Internal Medicine | Admitting: Internal Medicine

## 2018-10-03 ENCOUNTER — Telehealth (HOSPITAL_COMMUNITY): Payer: Self-pay

## 2018-10-03 VITALS — BP 130/92 | HR 98 | Wt 299.4 lb

## 2018-10-03 DIAGNOSIS — I110 Hypertensive heart disease with heart failure: Secondary | ICD-10-CM | POA: Diagnosis present

## 2018-10-03 DIAGNOSIS — G4733 Obstructive sleep apnea (adult) (pediatric): Secondary | ICD-10-CM | POA: Insufficient documentation

## 2018-10-03 DIAGNOSIS — Z7984 Long term (current) use of oral hypoglycemic drugs: Secondary | ICD-10-CM | POA: Diagnosis not present

## 2018-10-03 DIAGNOSIS — Z86718 Personal history of other venous thrombosis and embolism: Secondary | ICD-10-CM | POA: Insufficient documentation

## 2018-10-03 DIAGNOSIS — Z7901 Long term (current) use of anticoagulants: Secondary | ICD-10-CM | POA: Diagnosis not present

## 2018-10-03 DIAGNOSIS — G4730 Sleep apnea, unspecified: Secondary | ICD-10-CM | POA: Insufficient documentation

## 2018-10-03 DIAGNOSIS — I5022 Chronic systolic (congestive) heart failure: Secondary | ICD-10-CM | POA: Insufficient documentation

## 2018-10-03 DIAGNOSIS — Z79899 Other long term (current) drug therapy: Secondary | ICD-10-CM | POA: Insufficient documentation

## 2018-10-03 DIAGNOSIS — Z885 Allergy status to narcotic agent status: Secondary | ICD-10-CM | POA: Insufficient documentation

## 2018-10-03 DIAGNOSIS — Z8249 Family history of ischemic heart disease and other diseases of the circulatory system: Secondary | ICD-10-CM | POA: Insufficient documentation

## 2018-10-03 DIAGNOSIS — M1990 Unspecified osteoarthritis, unspecified site: Secondary | ICD-10-CM | POA: Diagnosis not present

## 2018-10-03 DIAGNOSIS — Z888 Allergy status to other drugs, medicaments and biological substances status: Secondary | ICD-10-CM | POA: Insufficient documentation

## 2018-10-03 DIAGNOSIS — J449 Chronic obstructive pulmonary disease, unspecified: Secondary | ICD-10-CM

## 2018-10-03 DIAGNOSIS — I428 Other cardiomyopathies: Secondary | ICD-10-CM | POA: Diagnosis not present

## 2018-10-03 DIAGNOSIS — Z90710 Acquired absence of both cervix and uterus: Secondary | ICD-10-CM | POA: Diagnosis not present

## 2018-10-03 DIAGNOSIS — Z91030 Bee allergy status: Secondary | ICD-10-CM | POA: Diagnosis not present

## 2018-10-03 DIAGNOSIS — Z87891 Personal history of nicotine dependence: Secondary | ICD-10-CM | POA: Diagnosis not present

## 2018-10-03 DIAGNOSIS — Z9119 Patient's noncompliance with other medical treatment and regimen: Secondary | ICD-10-CM | POA: Insufficient documentation

## 2018-10-03 DIAGNOSIS — I2510 Atherosclerotic heart disease of native coronary artery without angina pectoris: Secondary | ICD-10-CM | POA: Diagnosis not present

## 2018-10-03 DIAGNOSIS — Z791 Long term (current) use of non-steroidal anti-inflammatories (NSAID): Secondary | ICD-10-CM | POA: Insufficient documentation

## 2018-10-03 DIAGNOSIS — E119 Type 2 diabetes mellitus without complications: Secondary | ICD-10-CM | POA: Insufficient documentation

## 2018-10-03 DIAGNOSIS — E785 Hyperlipidemia, unspecified: Secondary | ICD-10-CM | POA: Diagnosis not present

## 2018-10-03 DIAGNOSIS — I472 Ventricular tachycardia: Secondary | ICD-10-CM | POA: Diagnosis not present

## 2018-10-03 MED ORDER — SACUBITRIL-VALSARTAN 49-51 MG PO TABS: 1.0000 | ORAL_TABLET | Freq: Two times a day (BID) | ORAL | 3 refills | Status: DC

## 2018-10-03 MED ORDER — TORSEMIDE 20 MG PO TABS: 40.0000 mg | ORAL_TABLET | Freq: Every day | ORAL | 6 refills | Status: DC

## 2018-10-03 NOTE — Progress Notes (Signed)
ReDS Vest - 10/03/18 1200      ReDS Vest   Fitting Posture  Sitting    Height Marker  Tall    Ruler Value  [place]  Aligned    ReDS Value  34

## 2018-10-03 NOTE — Patient Instructions (Addendum)
INCREASE Entresto to 49/48m (1 tab) twice a day  Please have labs in 10-14 days Please call [phone] to schedule lab visit.  We will only contact you if something comes back abnormal or we need to make some changes. Otherwise no news is good news!   You have been referred to Dr. JDelene Lolloffice for genetic testing. They will contact you to schedule your appointment.  Office will refer you to paramedicine.  They will call you to arrange a home visit.   Your physician recommends that you schedule a follow-up appointment in: 4 weeks with NP/PA clinic and 9 weeks with Echo and Dr. BHaroldine Laws

## 2018-10-03 NOTE — Telephone Encounter (Signed)
Prior authorization through [place] was initiated for Entresto 49-51 medication and sent via [place] on 10/03/2018.

## 2018-10-03 NOTE — Telephone Encounter (Signed)
Prior authorization through [place] was APPROVED for Anna Rowe and will expire on 10/04/2019.

## 2018-10-09 ENCOUNTER — Telehealth (HOSPITAL_COMMUNITY): Payer: Self-pay

## 2018-10-09 ENCOUNTER — Other Ambulatory Visit (HOSPITAL_COMMUNITY): Payer: Self-pay

## 2018-10-09 MED ORDER — SPIRONOLACTONE 25 MG PO TABS: 12.5000 mg | ORAL_TABLET | Freq: Every day | ORAL | 5 refills | Status: DC

## 2018-10-09 MED ORDER — CARVEDILOL 3.125 MG PO TABS: 3.1250 mg | ORAL_TABLET | Freq: Two times a day (BID) | ORAL | 11 refills | Status: DC

## 2018-10-09 MED ORDER — POTASSIUM CHLORIDE CRYS ER 20 MEQ PO TBCR: 20.0000 meq | EXTENDED_RELEASE_TABLET | Freq: Two times a day (BID) | ORAL | 5 refills | Status: DC

## 2018-10-09 MED ORDER — DIGOXIN 125 MCG PO TABS: 0.1250 mg | ORAL_TABLET | Freq: Every day | ORAL | 5 refills | Status: DC

## 2018-10-09 NOTE — Telephone Encounter (Signed)
I called Anna Rowe to schedule an appointment. She stated that she was aware that she was referred to the program and seemed eager to meet. We agreed to meet on Wednesday at 13:00. This will be our first visit.

## 2018-10-10 ENCOUNTER — Other Ambulatory Visit (HOSPITAL_COMMUNITY): Payer: Self-pay

## 2018-10-10 NOTE — Progress Notes (Signed)
Paramedicine Encounter    Patient ID: Anna Rowe, female    DOB: Dec 23, 1975, 43 y.o.   MRN: [id number]   Patient Care Team: [facility] as PCP - General  Patient Active Problem List   Diagnosis Date Noted  . CHF (congestive heart failure) ([place]) 08/09/2018  . Acute diastolic CHF (congestive heart failure) ([place]) 08/08/2018  . DM2 (diabetes mellitus, type 2) ([place]) 08/08/2018  . History of DVT (deep vein thrombosis) 08/08/2018  . Unilateral primary osteoarthritis, left knee 08/24/2017  . History of total right knee replacement 08/24/2017  . Chronic bilateral low back pain 08/24/2017  . Status post total replacement of right hip 07/14/2017  . Status post total replacement of left hip 04/28/2017  . Unilateral primary osteoarthritis, left hip 03/09/2017  . Unilateral primary osteoarthritis, right hip 03/09/2017  . Primary osteoarthritis of right knee 04/04/2016    Current Outpatient Medications:  .  amitriptyline (ELAVIL) 50 MG tablet, Take 1 tablet (50 mg total) by mouth at bedtime., Disp: 30 tablet, Rfl: 2 .  carvedilol (COREG) 3.125 MG tablet, Take 1 tablet (3.125 mg total) by mouth 2 (two) times daily with a meal., Disp: 60 tablet, Rfl: 11 .  CHANTIX CONTINUING MONTH PAK 1 MG tablet, Take 1 tablet (1 mg total) by mouth 2 (two) times daily., Disp: 60 tablet, Rfl: 0 .  cholecalciferol (VITAMIN D3) 25 MCG (1000 UT) tablet, Take 3,000 Units by mouth daily., Disp: , Rfl:  .  cyclobenzaprine (FLEXERIL) 10 MG tablet, Take 10 mg by mouth 3 (three) times daily., Disp: , Rfl: 1 .  digoxin (LANOXIN) 0.125 MG tablet, Take 1 tablet (0.125 mg total) by mouth daily., Disp: 30 tablet, Rfl: 5 .  docusate sodium (COLACE) 100 MG capsule, Take 100 mg by mouth 2 (two) times daily. , Disp: , Rfl: 6 .  gabapentin (NEURONTIN) 600 MG tablet, Take 600 mg by mouth 4 (four) times daily. , Disp: , Rfl: 3 .  ipratropium-albuterol (DUONEB) 0.5-2.5 (3) MG/3ML SOLN, Take 3 mLs by  nebulization 2 (two) times daily., Disp: 360 mL, Rfl: 0 .  Meloxicam 15 MG TBDP, Take 15 mg by mouth daily., Disp: , Rfl:  .  metFORMIN (GLUCOPHAGE-XR) 500 MG 24 hr tablet, Take 500 mg 2 (two) times daily by mouth. , Disp: , Rfl: 3 .  Multiple Vitamin (MULTIVITAMIN WITH MINERALS) TABS tablet, Take 1 tablet by mouth daily., Disp: , Rfl:  .  oxyCODONE-acetaminophen (PERCOCET/ROXICET) 5-325 MG tablet, Take 1 tablet by mouth 4 (four) times daily., Disp: , Rfl:  .  OXYCONTIN 10 MG 12 hr tablet, Take 10 mg by mouth every 12 (twelve) hours., Disp: , Rfl: 0 .  potassium chloride SA (K-DUR,KLOR-CON) 20 MEQ tablet, Take 1 tablet (20 mEq total) by mouth 2 (two) times daily., Disp: 60 tablet, Rfl: 5 .  rosuvastatin (CRESTOR) 10 MG tablet, Take 1 tablet (10 mg total) by mouth at bedtime., Disp: 30 tablet, Rfl: 5 .  sacubitril-valsartan (ENTRESTO) 49-51 MG, Take 1 tablet by mouth 2 (two) times daily., Disp: 60 tablet, Rfl: 3 .  spironolactone (ALDACTONE) 25 MG tablet, Take 0.5 tablets (12.5 mg total) by mouth at bedtime., Disp: 30 tablet, Rfl: 5 .  torsemide (DEMADEX) 20 MG tablet, Take 2 tablets (40 mg total) by mouth daily., Disp: 60 tablet, Rfl: 6 .  XARELTO 20 MG TABS tablet, Take 1 tablet (20 mg total) by mouth daily., Disp: 30 tablet, Rfl: 0 .  mometasone-formoterol (DULERA) 200-5 MCG/ACT AERO, Inhale 2 puffs into  the lungs 2 (two) times daily. (Patient not taking: Reported on 10/10/2018), Disp: 2 Inhaler, Rfl: 0 Allergies  Allergen Reactions  . Methocarbamol Anaphylaxis and Swelling  . Tramadol Anaphylaxis    Tolerates Dilaudid, Oxycontin 04/04/16  . Bee Venom Hives      Social History   Socioeconomic History  . Marital status: Married    Spouse name: Not on file  . Number of children: Not on file  . Years of education: Not on file  . Highest education level: Not on file  Occupational History  . Not on file  Social Needs  . Financial resource strain: Not on file  . Food insecurity:     Worry: Not on file    Inability: Not on file  . Transportation needs:    Medical: Not on file    Non-medical: Not on file  Tobacco Use  . Smoking status: Former Smoker    Packs/day: 0.50    Years: 17.00    Pack years: 8.50    Types: Cigarettes    Last attempt to quit: 08/07/2018    Years since quitting: 0.1  . Smokeless tobacco: Never Used  . Tobacco comment: 3 Loose cigarettes in last week  Substance and Sexual Activity  . Alcohol use: No    Comment: seldom   . Drug use: No  . Sexual activity: Not Currently  Lifestyle  . Physical activity:    Days per week: Not on file    Minutes per session: Not on file  . Stress: Not on file  Relationships  . Social connections:    Talks on phone: Not on file    Gets together: Not on file    Attends religious service: Not on file    Active member of club or organization: Not on file    Attends meetings of clubs or organizations: Not on file    Relationship status: Not on file  . Intimate partner violence:    Fear of current or ex partner: Not on file    Emotionally abused: Not on file    Physically abused: Not on file    Forced sexual activity: Not on file  Other Topics Concern  . Not on file  Social History Narrative  . Not on file    Physical Exam Cardiovascular:     Rate and Rhythm: Bradycardia present. Rhythm irregular.  Pulmonary:     Effort: Pulmonary effort is normal.     Breath sounds: Normal breath sounds.  Musculoskeletal: Normal range of motion.     Right lower leg: No edema.     Left lower leg: No edema.  Skin:    General: Skin is warm and dry.     Capillary Refill: Capillary refill takes less than 2 seconds.  Neurological:     Mental Status: She is alert and oriented to person, place, and time.  Psychiatric:        Mood and Affect: Mood normal.         Future Appointments  Date Time Provider [place]  10/25/2018  1:00 PM Debbe Mounts, PhD [facility]  10/31/2018 10:30 AM  [facility] PA/NP [facility] None  12/12/2018 10:00 AM MC ECHO 1-BUZZ [facility]  12/12/2018 11:00 AM Bensimhon, Shaune Pascal, MD [facility] None    BP 122/90 (BP Location: Left Arm, Patient Position: Sitting, Cuff Size: Large)   Pulse (!) 50   Resp 16   Wt 298 lb (135.2 kg)   SpO2 96%   BMI 40.42  kg/m   Weight yesterday- 297 lb Last visit weight- n/a  Anna Rowe was seen at home today for our initial visit. She reported feeling generally well, denying chest pain, SOB, or dizziness. She did complain of a headache which is recurring and located in her forehead. She has been compliant with her medications however she has been taking potassium and spironolactone differently due to a misunderstanding. She has been taking potassium chloride 20 mEq once daily after her clinic appointment on 09/18/18 but on 09/25/18 her potassium level was within normal limits. At her appointment on 10/03/18 she began taking 25 mg of spironolactone but was supposed to still be on 12.5 mg. I contacted the clinic and was advised to have her stay on 20 mEq daily of potassium and go back to 12.5 mg of spironolactone. I relayed this to her and she was understanding and agreeable. She seems to have a firm grasp on her medications and uses a pillbox which is filled by her sister. Her sister does this for her because her neuropathy makes it difficult to sort the pills. At present she seems to be stable and it does not seem like she will need assistance for very long. Her biggest concern is that she is not urinating as frequently as she used to, however her labs on 2/11 showed only mildly elevated creatinine and her GFR was within normal limits. She said she drinks 1800 ml of liquid daily but she thinks her legs are beginning to swell. I did not see any lower extremity edema. I will follow up next week.   Jacquiline Doe, EMT 10/10/18  ACTION: Home visit completed Next visit planned for 1 week

## 2018-10-16 ENCOUNTER — Telehealth (HOSPITAL_COMMUNITY): Payer: Self-pay

## 2018-10-16 NOTE — Telephone Encounter (Signed)
I called Anna Rowe to schedule an appointment for tomorrow. She stated she would be able to meet in the afternoon so we agreed on 15:00. She stated she was unable to get spironolactone due to a problem at the pharmacy. I called the pharmacy and they advised the issue had been resolved and she would be able to pick it up today. I relayed this information to Anna Rowe and she stated she would have it by tomorrow.

## 2018-10-17 ENCOUNTER — Other Ambulatory Visit: Payer: Self-pay

## 2018-10-17 ENCOUNTER — Other Ambulatory Visit (HOSPITAL_COMMUNITY): Payer: Self-pay

## 2018-10-17 NOTE — Progress Notes (Signed)
Paramedicine Encounter    Patient ID: Anna Rowe, female    DOB: 1976/07/14, 43 y.o.   MRN: [id number]   Patient Care Team: [facility] [place] as PCP - General  Patient Active Problem List   Diagnosis Date Noted  . CHF (congestive heart failure) ([place]) 08/09/2018  . Acute diastolic CHF (congestive heart failure) ([place]) 08/08/2018  . DM2 (diabetes mellitus, type 2) ([place]) 08/08/2018  . History of DVT (deep vein thrombosis) 08/08/2018  . Unilateral primary osteoarthritis, left knee 08/24/2017  . History of total right knee replacement 08/24/2017  . Chronic bilateral low back pain 08/24/2017  . Status post total replacement of right hip 07/14/2017  . Status post total replacement of left hip 04/28/2017  . Unilateral primary osteoarthritis, left hip 03/09/2017  . Unilateral primary osteoarthritis, right hip 03/09/2017  . Primary osteoarthritis of right knee 04/04/2016    Current Outpatient Medications:  .  amitriptyline (ELAVIL) 50 MG tablet, Take 1 tablet (50 mg total) by mouth at bedtime., Disp: 30 tablet, Rfl: 2 .  carvedilol (COREG) 3.125 MG tablet, Take 1 tablet (3.125 mg total) by mouth 2 (two) times daily with a meal., Disp: 60 tablet, Rfl: 11 .  CHANTIX CONTINUING MONTH PAK 1 MG tablet, Take 1 tablet (1 mg total) by mouth 2 (two) times daily., Disp: 60 tablet, Rfl: 0 .  cholecalciferol (VITAMIN D3) 25 MCG (1000 UT) tablet, Take 3,000 Units by mouth daily., Disp: , Rfl:  .  cyclobenzaprine (FLEXERIL) 10 MG tablet, Take 10 mg by mouth 3 (three) times daily., Disp: , Rfl: 1 .  digoxin (LANOXIN) 0.125 MG tablet, Take 1 tablet (0.125 mg total) by mouth daily., Disp: 30 tablet, Rfl: 5 .  docusate sodium (COLACE) 100 MG capsule, Take 100 mg by mouth 2 (two) times daily. , Disp: , Rfl: 6 .  gabapentin (NEURONTIN) 600 MG tablet, Take 600 mg by mouth 4 (four) times daily. , Disp: , Rfl: 3 .  ipratropium-albuterol (DUONEB) 0.5-2.5 (3) MG/3ML SOLN, Take 3 mLs by  nebulization 2 (two) times daily., Disp: 360 mL, Rfl: 0 .  Meloxicam 15 MG TBDP, Take 15 mg by mouth daily., Disp: , Rfl:  .  metFORMIN (GLUCOPHAGE-XR) 500 MG 24 hr tablet, Take 500 mg 2 (two) times daily by mouth. , Disp: , Rfl: 3 .  Multiple Vitamin (MULTIVITAMIN WITH MINERALS) TABS tablet, Take 1 tablet by mouth daily., Disp: , Rfl:  .  oxyCODONE-acetaminophen (PERCOCET/ROXICET) 5-325 MG tablet, Take 1 tablet by mouth 4 (four) times daily., Disp: , Rfl:  .  OXYCONTIN 10 MG 12 hr tablet, Take 10 mg by mouth every 12 (twelve) hours., Disp: , Rfl: 0 .  rosuvastatin (CRESTOR) 10 MG tablet, Take 1 tablet (10 mg total) by mouth at bedtime., Disp: 30 tablet, Rfl: 5 .  sacubitril-valsartan (ENTRESTO) 49-51 MG, Take 1 tablet by mouth 2 (two) times daily., Disp: 60 tablet, Rfl: 3 .  spironolactone (ALDACTONE) 25 MG tablet, Take 0.5 tablets (12.5 mg total) by mouth at bedtime., Disp: 30 tablet, Rfl: 5 .  torsemide (DEMADEX) 20 MG tablet, Take 2 tablets (40 mg total) by mouth daily., Disp: 60 tablet, Rfl: 6 .  XARELTO 20 MG TABS tablet, Take 1 tablet (20 mg total) by mouth daily., Disp: 30 tablet, Rfl: 0 .  mometasone-formoterol (DULERA) 200-5 MCG/ACT AERO, Inhale 2 puffs into the lungs 2 (two) times daily. (Patient not taking: Reported on 10/10/2018), Disp: 2 Inhaler, Rfl: 0 .  potassium chloride SA (K-DUR,KLOR-CON) 20 MEQ tablet,  Take 1 tablet (20 mEq total) by mouth 2 (two) times daily., Disp: 60 tablet, Rfl: 5 Allergies  Allergen Reactions  . Methocarbamol Anaphylaxis and Swelling  . Tramadol Anaphylaxis    Tolerates Dilaudid, Oxycontin 04/04/16  . Bee Venom Hives      Social History   Socioeconomic History  . Marital status: Married    Spouse name: Not on file  . Number of children: Not on file  . Years of education: Not on file  . Highest education level: Not on file  Occupational History  . Not on file  Social Needs  . Financial resource strain: Not on file  . Food insecurity:     Worry: Not on file    Inability: Not on file  . Transportation needs:    Medical: Not on file    Non-medical: Not on file  Tobacco Use  . Smoking status: Former Smoker    Packs/day: 0.50    Years: 17.00    Pack years: 8.50    Types: Cigarettes    Last attempt to quit: 08/07/2018    Years since quitting: 0.1  . Smokeless tobacco: Never Used  . Tobacco comment: 3 Loose cigarettes in last week  Substance and Sexual Activity  . Alcohol use: No    Comment: seldom   . Drug use: No  . Sexual activity: Not Currently  Lifestyle  . Physical activity:    Days per week: Not on file    Minutes per session: Not on file  . Stress: Not on file  Relationships  . Social connections:    Talks on phone: Not on file    Gets together: Not on file    Attends religious service: Not on file    Active member of club or organization: Not on file    Attends meetings of clubs or organizations: Not on file    Relationship status: Not on file  . Intimate partner violence:    Fear of current or ex partner: Not on file    Emotionally abused: Not on file    Physically abused: Not on file    Forced sexual activity: Not on file  Other Topics Concern  . Not on file  Social History Narrative  . Not on file    Physical Exam Cardiovascular:     Rate and Rhythm: Regular rhythm.  Pulmonary:     Effort: Pulmonary effort is normal.     Breath sounds: Normal breath sounds.  Abdominal:     General: There is no distension.     Palpations: Abdomen is soft.  Musculoskeletal: Normal range of motion.     Right lower leg: No edema.     Left lower leg: No edema.  Skin:    General: Skin is warm and dry.     Capillary Refill: Capillary refill takes less than 2 seconds.  Neurological:     Mental Status: She is alert and oriented to person, place, and time.  Psychiatric:        Mood and Affect: Mood normal.         Future Appointments  Date Time Provider [place]  10/25/2018  1:00 PM Debbe Mounts, PhD [facility]  10/31/2018 10:30 AM [facility] PA/NP [facility] None  12/12/2018 10:00 AM MC ECHO 1-BUZZ [facility]  12/12/2018 11:00 AM Bensimhon, Shaune Pascal, MD [facility] None    BP 130/80 (BP Location: Left Arm, Patient Position: Sitting, Cuff Size: Large)   Pulse 100   Resp  16   Wt 296 lb 14.4 oz (134.7 kg)   SpO2 98%   BMI 40.27 kg/m   Weight yesterday- 297.5 lb Last visit weight- 298 lb  Ms Buttram was seen at home today and rpeorted feeling generally well. She denied chest pain, SOB, headache, dizziness or orthopnea. She reported being compliant with her medications. She reported losing a significant amount of weight following a bowel movement earlier this week but stated she has gained the weight back over the course of 4 days. Nevertheless her weight is down one pound since last week. We went over her medications and she was able to recall the dose and time of day for each medication she is taking. Her only problem with filling her pillbox is that she has neuropathy and as a result has a hard time picking the pills out of the bottles. To overcome this I suggested pill packs however she stated it would not be feasible because some of her medications are from a workers compensation case and the others are through insurance. I will explore this further but she ensured me that her family helps her with this when she needs it. We were unable to find the lab results from the blood work she had done today so I advised her to continue on 20 mEq of potassium daily until we can locate her results and tell her otherwise. She was understanding and agreeable.   Jacquiline Doe, EMT 10/17/18  ACTION: Home visit completed Next visit planned for 1 week

## 2018-10-18 LAB — BASIC METABOLIC PANEL
BUN/Creatinine Ratio: 10 (ref 9–23)
BUN: 12 mg/dL (ref 6–24)
CO2: 18 mmol/L — ABNORMAL LOW (ref 20–29)
Calcium: 9.7 mg/dL (ref 8.7–10.2)
Chloride: 100 mmol/L (ref 96–106)
Creatinine, Ser: 1.22 mg/dL — ABNORMAL HIGH (ref 0.57–1.00)
GFR calc Af Amer: 63 mL/min/{1.73_m2} (ref >59)
GFR calc non Af Amer: 55 mL/min/{1.73_m2} — ABNORMAL LOW (ref >59)
Glucose: 315 mg/dL — ABNORMAL HIGH (ref 65–99)
Potassium: 4.9 mmol/L (ref 3.5–5.2)
Sodium: 137 mmol/L (ref 134–144)

## 2018-10-23 ENCOUNTER — Telehealth (HOSPITAL_COMMUNITY): Payer: Self-pay | Admitting: Cardiology

## 2018-10-23 DIAGNOSIS — I5022 Chronic systolic (congestive) heart failure: Secondary | ICD-10-CM

## 2018-10-23 NOTE — Telephone Encounter (Signed)
As requested lab order placed

## 2018-10-23 NOTE — Telephone Encounter (Signed)
-----   Message from Georgiana Shore, NP sent at 10/23/2018  3:20 PM EDT ----- Regarding: FW: EPIC genetic test order Can you guys please order this?  Thanks! ----- Message ----- From: Debbe Mounts, PhD Sent: 10/23/2018   2:22 PM EDT To: Georgiana Shore, NP Subject: EPIC genetic test order                        Dear Caryl Pina, thank you for referring Anna Rowe to the genetics clinic.  In preparation for her clinic this Thursday, can you place an order for genetic testing for NICM in Epic. You can go to lab orders and type Cohesion.Then select HCM and ARVC/AC tests as this includes the genes involved in NICM.   Thanks [facility]

## 2018-10-24 ENCOUNTER — Telehealth (HOSPITAL_COMMUNITY): Payer: Self-pay

## 2018-10-24 ENCOUNTER — Other Ambulatory Visit (HOSPITAL_COMMUNITY): Payer: Self-pay | Admitting: Internal Medicine

## 2018-10-24 NOTE — Telephone Encounter (Signed)
I called Anna Rowe to see if she was available for an appointment. She did not answer so I left a voicemail requesting she call me back.

## 2018-10-25 ENCOUNTER — Other Ambulatory Visit: Payer: Self-pay

## 2018-10-25 ENCOUNTER — Other Ambulatory Visit (HOSPITAL_COMMUNITY): Payer: Self-pay | Admitting: Cardiology

## 2018-10-25 ENCOUNTER — Encounter (INDEPENDENT_AMBULATORY_CARE_PROVIDER_SITE_OTHER): Payer: Self-pay

## 2018-10-25 ENCOUNTER — Encounter: Payer: BC Managed Care – PPO | Admitting: Genetic Counselor

## 2018-10-25 ENCOUNTER — Ambulatory Visit: Payer: 59 | Admitting: Genetic Counselor

## 2018-10-25 DIAGNOSIS — I5022 Chronic systolic (congestive) heart failure: Secondary | ICD-10-CM

## 2018-10-26 ENCOUNTER — Other Ambulatory Visit (HOSPITAL_COMMUNITY): Payer: Self-pay

## 2018-10-26 DIAGNOSIS — I5022 Chronic systolic (congestive) heart failure: Secondary | ICD-10-CM

## 2018-10-26 NOTE — Progress Notes (Signed)
Correction for labs

## 2018-10-26 NOTE — Progress Notes (Signed)
Orders placed for family varian (cohesion) hcm, arvc.

## 2018-10-30 ENCOUNTER — Telehealth (HOSPITAL_COMMUNITY): Payer: Self-pay

## 2018-10-30 MED ORDER — XARELTO 20 MG PO TABS: 20.0000 mg | ORAL_TABLET | Freq: Every day | ORAL | 0 refills | Status: DC

## 2018-10-30 MED ORDER — SACUBITRIL-VALSARTAN 49-51 MG PO TABS: 1.0000 | ORAL_TABLET | Freq: Two times a day (BID) | ORAL | 3 refills | Status: DC

## 2018-10-30 MED ORDER — TORSEMIDE 20 MG PO TABS: 40.0000 mg | ORAL_TABLET | Freq: Every day | ORAL | 6 refills | Status: DC

## 2018-10-30 NOTE — Telephone Encounter (Addendum)
Triage Appointment Phone Calls  NOTE : At this time, do not cancel POST HOSP Appointments.   Introduction: We are calling today to discuss your upcoming appointment. As you know we are dealing with the Coronavirus COVID-19 and want to take all precautions necessary to protect our patients and staff.   Questions 1. Have you or any one in your house had a fever in the past 2 weeks? NO 2. How are you feeling/doing?  a. Is your weight stable? yes BP stable if checking at home? Yes checks 3X a day Have they needed extra diuretics? no Is their breathing at baseline? no  -> If symptomatic, KEEP appt, and remind only ONE person may accompany them, and no children.   -> If asymptomatic, pt asymptomatic#3. 3. Do you have enough of your cardiac medications to last you for the next 2 months, or do you need refills? Needs refills for cardiac meds 4. Do you have enough food and supplies to last for at least 2 weeks? yes   5. Reschedule their appointment with the following recommendations: pt previously had appointment 12/12/2018. Pt encouraged to call us or zach with paramedicine if pt becomes symptomatic                 Pt states that she is doing well and is currently asymptomatic. Denies needs for food and or supplies at this time. Pt verbalized understanding of appointment change.
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
## 2018-10-31 ENCOUNTER — Telehealth (HOSPITAL_COMMUNITY): Payer: Self-pay

## 2018-10-31 ENCOUNTER — Encounter (HOSPITAL_COMMUNITY): Payer: BC Managed Care – PPO

## 2018-10-31 NOTE — Telephone Encounter (Signed)
Anna Rowe returned my call and advised she had been sleeping when I called. She was not in need of anything so we agreed to see each other next week. I scheduled her for next Wednesday at 13:00.

## 2018-10-31 NOTE — Telephone Encounter (Signed)
I called Anna Rowe to see if she was available for a visit. She did not answer the phone so I left a message requesting she call me back.

## 2018-11-07 ENCOUNTER — Ambulatory Visit (HOSPITAL_COMMUNITY)
Admission: RE | Admit: 2018-11-07 | Discharge: 2018-11-07 | Disposition: A | Discharge status: Home / Self Care | Payer: 59 | Source: Ambulatory Visit | Attending: Student | Admitting: Student

## 2018-11-07 ENCOUNTER — Other Ambulatory Visit (HOSPITAL_COMMUNITY): Payer: Self-pay

## 2018-11-07 ENCOUNTER — Other Ambulatory Visit: Payer: Self-pay

## 2018-11-07 VITALS — BP 127/74 | HR 90

## 2018-11-07 DIAGNOSIS — R002 Palpitations: Secondary | ICD-10-CM

## 2018-11-07 DIAGNOSIS — Z86718 Personal history of other venous thrombosis and embolism: Secondary | ICD-10-CM

## 2018-11-07 DIAGNOSIS — I493 Ventricular premature depolarization: Secondary | ICD-10-CM

## 2018-11-07 DIAGNOSIS — I5022 Chronic systolic (congestive) heart failure: Secondary | ICD-10-CM

## 2018-11-07 DIAGNOSIS — G4733 Obstructive sleep apnea (adult) (pediatric): Secondary | ICD-10-CM

## 2018-11-07 DIAGNOSIS — J449 Chronic obstructive pulmonary disease, unspecified: Secondary | ICD-10-CM | POA: Diagnosis not present

## 2018-11-07 DIAGNOSIS — I2510 Atherosclerotic heart disease of native coronary artery without angina pectoris: Secondary | ICD-10-CM

## 2018-11-07 MED ORDER — CARVEDILOL 6.25 MG PO TABS: 6.2500 mg | ORAL_TABLET | Freq: Two times a day (BID) | ORAL | 6 refills | Status: DC

## 2018-11-07 NOTE — Progress Notes (Signed)
Spoke with patient, discussed instructions from virtual visit with PA this afternoon.  advised that Zio patch was ordered and she will be contacted from company to walk her through steps of receiving and applying patch. Discussed medicine changes as well. Pt verbalized understanding of all.  Paramedicine Zach aware of all.  Scheduled for anothr virtual visit on 11/21/18 1:30p

## 2018-11-07 NOTE — Patient Instructions (Signed)
INCREASE Coreg to 6.85m (1 tab) twice day  Your provider has recommended that  you wear a Zio Patch for 3 days.  This monitor will record your heart rhythm for our review.  IF you have any symptoms while wearing the monitor please press the button.  If you have any issues with the patch or you notice a red or orange light on it please call the company at [phone]  Once you remove the patch please mail it back to the company as soon as possible so we can get the results.

## 2018-11-07 NOTE — Addendum Note (Signed)
Encounter addended by: Valeda Malm, RN on: 11/07/2018 4:42 PM  Actions taken: Order list changed, Clinical Note Signed

## 2018-11-07 NOTE — Progress Notes (Signed)
Paramedicine Encounter    Patient ID: Anna Rowe, female    DOB: [birth year], 43 y.o.   MRN: [id number]   Patient Care Team: [facility] as PCP - General  Patient Active Problem List   Diagnosis Date Noted  . CHF (congestive heart failure) ([place]) 08/09/2018  . Acute diastolic CHF (congestive heart failure) ([place]) 08/08/2018  . DM2 (diabetes mellitus, type 2) ([place]) 08/08/2018  . History of DVT (deep vein thrombosis) 08/08/2018  . Unilateral primary osteoarthritis, left knee 08/24/2017  . History of total right knee replacement 08/24/2017  . Chronic bilateral low back pain 08/24/2017  . Status post total replacement of right hip 07/14/2017  . Status post total replacement of left hip 04/28/2017  . Unilateral primary osteoarthritis, left hip 03/09/2017  . Unilateral primary osteoarthritis, right hip 03/09/2017  . Primary osteoarthritis of right knee 04/04/2016    Current Outpatient Medications:  .  amitriptyline (ELAVIL) 50 MG tablet, Take 1 tablet (50 mg total) by mouth at bedtime., Disp: 30 tablet, Rfl: 2 .  carvedilol (COREG) 3.125 MG tablet, Take 1 tablet (3.125 mg total) by mouth 2 (two) times daily with a meal., Disp: 60 tablet, Rfl: 11 .  cholecalciferol (VITAMIN D3) 25 MCG (1000 UT) tablet, Take 3,000 Units by mouth daily., Disp: , Rfl:  .  cyclobenzaprine (FLEXERIL) 10 MG tablet, Take 10 mg by mouth 3 (three) times daily., Disp: , Rfl: 1 .  digoxin (LANOXIN) 0.125 MG tablet, Take 1 tablet (0.125 mg total) by mouth daily., Disp: 30 tablet, Rfl: 5 .  docusate sodium (COLACE) 100 MG capsule, Take 100 mg by mouth 2 (two) times daily. , Disp: , Rfl: 6 .  gabapentin (NEURONTIN) 600 MG tablet, Take 600 mg by mouth 4 (four) times daily. , Disp: , Rfl: 3 .  ipratropium-albuterol (DUONEB) 0.5-2.5 (3) MG/3ML SOLN, Take 3 mLs by nebulization 2 (two) times daily., Disp: 360 mL, Rfl: 0 .  Meloxicam 15 MG TBDP, Take 15 mg by mouth daily., Disp: , Rfl:  .  metFORMIN  (GLUCOPHAGE-XR) 500 MG 24 hr tablet, Take 500 mg 2 (two) times daily by mouth. , Disp: , Rfl: 3 .  Multiple Vitamin (MULTIVITAMIN WITH MINERALS) TABS tablet, Take 1 tablet by mouth daily., Disp: , Rfl:  .  oxyCODONE-acetaminophen (PERCOCET/ROXICET) 5-325 MG tablet, Take 1 tablet by mouth 4 (four) times daily., Disp: , Rfl:  .  OXYCONTIN 10 MG 12 hr tablet, Take 10 mg by mouth every 12 (twelve) hours., Disp: , Rfl: 0 .  potassium chloride SA (K-DUR,KLOR-CON) 20 MEQ tablet, Take 1 tablet (20 mEq total) by mouth 2 (two) times daily., Disp: 60 tablet, Rfl: 5 .  rosuvastatin (CRESTOR) 10 MG tablet, Take 1 tablet (10 mg total) by mouth at bedtime., Disp: 30 tablet, Rfl: 5 .  sacubitril-valsartan (ENTRESTO) 49-51 MG, Take 1 tablet by mouth 2 (two) times daily., Disp: 60 tablet, Rfl: 3 .  spironolactone (ALDACTONE) 25 MG tablet, Take 0.5 tablets (12.5 mg total) by mouth at bedtime., Disp: 30 tablet, Rfl: 5 .  torsemide (DEMADEX) 20 MG tablet, Take 2 tablets (40 mg total) by mouth daily., Disp: 60 tablet, Rfl: 6 .  XARELTO 20 MG TABS tablet, Take 1 tablet (20 mg total) by mouth daily., Disp: 30 tablet, Rfl: 0 .  CHANTIX CONTINUING MONTH PAK 1 MG tablet, Take 1 tablet (1 mg total) by mouth 2 (two) times daily. (Patient not taking: Reported on 11/07/2018), Disp: 60 tablet, Rfl: 0 .  mometasone-formoterol (DULERA) 200-5  MCG/ACT AERO, Inhale 2 puffs into the lungs 2 (two) times daily. (Patient not taking: Reported on 10/10/2018), Disp: 2 Inhaler, Rfl: 0 Allergies  Allergen Reactions  . Methocarbamol Anaphylaxis and Swelling  . Tramadol Anaphylaxis    Tolerates Dilaudid, Oxycontin 04/04/16  . Bee Venom Hives      Social History   Socioeconomic History  . Marital status: Married    Spouse name: Not on file  . Number of children: Not on file  . Years of education: Not on file  . Highest education level: Not on file  Occupational History  . Not on file  Social Needs  . Financial resource strain: Not on  file  . Food insecurity:    Worry: Not on file    Inability: Not on file  . Transportation needs:    Medical: Not on file    Non-medical: Not on file  Tobacco Use  . Smoking status: Former Smoker    Packs/day: 0.50    Years: 17.00    Pack years: 8.50    Types: Cigarettes    Last attempt to quit: 08/07/2018    Years since quitting: 0.2  . Smokeless tobacco: Never Used  . Tobacco comment: 3 Loose cigarettes in last week  Substance and Sexual Activity  . Alcohol use: No    Comment: seldom   . Drug use: No  . Sexual activity: Not Currently  Lifestyle  . Physical activity:    Days per week: Not on file    Minutes per session: Not on file  . Stress: Not on file  Relationships  . Social connections:    Talks on phone: Not on file    Gets together: Not on file    Attends religious service: Not on file    Active member of club or organization: Not on file    Attends meetings of clubs or organizations: Not on file    Relationship status: Not on file  . Intimate partner violence:    Fear of current or ex partner: Not on file    Emotionally abused: Not on file    Physically abused: Not on file    Forced sexual activity: Not on file  Other Topics Concern  . Not on file  Social History Narrative  . Not on file    Physical Exam Cardiovascular:     Rate and Rhythm: Bradycardia present. Rhythm irregular.  Pulmonary:     Effort: Pulmonary effort is normal.     Breath sounds: Normal breath sounds.  Musculoskeletal:     Right lower leg: No edema.     Left lower leg: No edema.  Skin:    General: Skin is warm and dry.     Capillary Refill: Capillary refill takes less than 2 seconds.  Neurological:     Mental Status: She is alert and oriented to person, place, and time.  Psychiatric:        Mood and Affect: Mood normal.         Future Appointments  Date Time Provider [place]  11/07/2018  1:30 PM [facility] PA/NP [facility] None  12/12/2018 10:00 AM [facility] ECHO 1-BUZZ  [facility]  12/12/2018 11:00 AM Bensimhon, Shaune Pascal, MD [facility] None    BP 137/74 (BP Location: Right Arm, Patient Position: Sitting, Cuff Size: Large)   Pulse (!) 46   Resp 16   Wt 295 lb 12.8 oz (134.2 kg)   SpO2 97%   BMI 40.12 kg/m   Weight yesterday- 296.4  lb Last visit weight- 296 lb  Anna Rowe was seen at home today and reported feeling well over the past week. She denied chest pain, headaches, dizziness or orthopnea. She did say she has exertional dyspnea and fluttering in her chest at times. She reported being compliant with her medications and her weight has been stable. Upon obtaining her vital signs I noted her radial pulse felt irregular and slow so I placed her on the heart monitor. She was found to be in a sinus rhythm with frequent multifocal PVC's. I contacted the HF clinic who subsequently conducted a virtual visit. Per Oda Kilts, Anna Rowe is to increase coreg to 6.25 mg BID and she is being sent a monitoring device to quantify the frequency of her PVC's. Anna Rowe was understanding and agreeable. Finally her medication list should be update to show that she is taking Amitiza 24 mcg BID for digestive health.   Increase coreg to 6.25 mg BID Clinic sending monitor Taking Amitiza 24 mcg BID  Jacquiline Doe, EMT 11/07/18  ACTION: Home visit completed Next visit planned for 1 week

## 2018-11-07 NOTE — Progress Notes (Signed)
Heart Failure TeleHealth Note  Due to national recommendations of social distancing due to [place] 19, Audio/video telehealth visit is felt to be most appropriate for this patient at this time. Pt is not set up with MyChart, so verbal consent was obtained  regarding telehealth for [facility].  I discussed the limitations, risks, security and privacy concerns of performing an evaluation and management service by telephone/video and the availability of in person appointments. I also discussed with the patient that there may be a patient responsible charge related to this service. The patient expressed understanding and agreed to proceed.  Date: 11/07/18   ID:  Anna Rowe, DOB 1976-01-01, MRN [id number]  Location: Home  Provider location: [address] [place] Type of Visit: Established patient/Paramedicine.   PCP:  [place]  Cardiologist:  No primary care provider on file. Primary HF: Dr. Haroldine Laws   Chief Complaint: Palpitations   History of Present Illness: Anna Rowe is a 43 y.o. female who presents via audio/video conferencing for a telehealth visit today.     Anna Rowe is a 43 y.o. female with a history of hyperlipidemia, DM, COPD, sleep apnea noncompliant with CPAP, chronic DVT(diagnosed 2018)on Xarelto, tobacco use,right knee replacement 2015,and COPD.She has had RTKR and R/L hip replacements.   She presents today as an add-on visit during regular follow up with paramedicine with complaints of palpitations. Noted to have frequent PVCs on 3-lead and EKG. She denies any more SOB than normal. She does her ADLs, but is SOB with anything more strenuous. She denies edema, and has been taking her medications as directed. She has palpitations that are worse with activity and are occasionally accompanied by lightheadedness if she is standing. She denies cough, fever, chills, recent travel to high risk  areas for COVID-19, or any contact with any known persons who have been tested or suspected for COVID 19. No syncope.   Past Medical History:  Diagnosis Date  . Arthritis   . CHF (congestive heart failure) ([place])   . COPD (chronic obstructive pulmonary disease) ([place])   . Diabetes mellitus ([place])    type 2   . DVT (deep venous thrombosis) ([place]) dx 10-2016   RLE   . HLD (hyperlipidemia)    on crestor  . Pneumonia    09/2016  . Sleep apnea    per patient ;been dx does not use CPAP does not tolerate   Past Surgical History:  Procedure Laterality Date  . ACHILLES TENDON REPAIR    . HERNIA REPAIR     x2; hiatal and umbilical   . IVC FILTER INSERTION N/A 04/25/2017   Procedure: IVC Filter Insertion;  Surgeon: Serafina Mitchell, MD;  Location: [facility];  Service: Cardiovascular;  Laterality: N/A;  . IVC FILTER REMOVAL N/A 10/24/2017   Procedure: IVC FILTER REMOVAL;  Surgeon: Serafina Mitchell, MD;  Location: [facility];  Service: Cardiovascular;  Laterality: N/A;  . JOINT REPLACEMENT     right hip 07/14/17 Dr. Ninfa Linden  . KNEE ARTHROSCOPY Right 2015  . plantar    . RIGHT/LEFT HEART CATH AND CORONARY ANGIOGRAPHY N/A 08/10/2018   Procedure: RIGHT/LEFT HEART CATH AND CORONARY ANGIOGRAPHY;  Surgeon: Jolaine Artist, MD;  Location: [facility];  Service: Cardiovascular;  Laterality: N/A;  . TOTAL HIP ARTHROPLASTY Left 04/28/2017   Procedure: LEFT TOTAL HIP ARTHROPLASTY ANTERIOR APPROACH;  Surgeon: Mcarthur Rossetti, MD;  Location: [facility];  Service: Orthopedics;  Laterality: Left;  . TOTAL HIP ARTHROPLASTY Right 07/14/2017   Procedure: RIGHT TOTAL HIP ARTHROPLASTY ANTERIOR APPROACH;  Surgeon: Mcarthur Rossetti, MD;  Location: [facility];  Service: Orthopedics;  Laterality: Right;  . TOTAL KNEE ARTHROPLASTY Right 04/04/2016   Procedure: TOTAL KNEE ARTHROPLASTY;  Surgeon: Dorna Leitz, MD;  Location: [facility];  Service: Orthopedics;  Laterality: Right;    Current  Outpatient Medications  Medication Sig Dispense Refill  . amitriptyline (ELAVIL) 50 MG tablet Take 1 tablet (50 mg total) by mouth at bedtime. 30 tablet 2  . carvedilol (COREG) 3.125 MG tablet Take 1 tablet (3.125 mg total) by mouth 2 (two) times daily with a meal. 60 tablet 11  . CHANTIX CONTINUING MONTH PAK 1 MG tablet Take 1 tablet (1 mg total) by mouth 2 (two) times daily. (Patient not taking: Reported on 11/07/2018) 60 tablet 0  . cholecalciferol (VITAMIN D3) 25 MCG (1000 UT) tablet Take 3,000 Units by mouth daily.    . cyclobenzaprine (FLEXERIL) 10 MG tablet Take 10 mg by mouth 3 (three) times daily.  1  . digoxin (LANOXIN) 0.125 MG tablet Take 1 tablet (0.125 mg total) by mouth daily. 30 tablet 5  . docusate sodium (COLACE) 100 MG capsule Take 100 mg by mouth 2 (two) times daily.   6  . gabapentin (NEURONTIN) 600 MG tablet Take 600 mg by mouth 4 (four) times daily.   3  . ipratropium-albuterol (DUONEB) 0.5-2.5 (3) MG/3ML SOLN Take 3 mLs by nebulization 2 (two) times daily. 360 mL 0  . Meloxicam 15 MG TBDP Take 15 mg by mouth daily.    . metFORMIN (GLUCOPHAGE-XR) 500 MG 24 hr tablet Take 500 mg 2 (two) times daily by mouth.   3  . mometasone-formoterol (DULERA) 200-5 MCG/ACT AERO Inhale 2 puffs into the lungs 2 (two) times daily. (Patient not taking: Reported on 10/10/2018) 2 Inhaler 0  . Multiple Vitamin (MULTIVITAMIN WITH MINERALS) TABS tablet Take 1 tablet by mouth daily.    Marland Kitchen oxyCODONE-acetaminophen (PERCOCET/ROXICET) 5-325 MG tablet Take 1 tablet by mouth 4 (four) times daily.    . OXYCONTIN 10 MG 12 hr tablet Take 10 mg by mouth every 12 (twelve) hours.  0  . potassium chloride SA (K-DUR,KLOR-CON) 20 MEQ tablet Take 1 tablet (20 mEq total) by mouth 2 (two) times daily. 60 tablet 5  . rosuvastatin (CRESTOR) 10 MG tablet Take 1 tablet (10 mg total) by mouth at bedtime. 30 tablet 5  . sacubitril-valsartan (ENTRESTO) 49-51 MG Take 1 tablet by mouth 2 (two) times daily. 60 tablet 3  .  spironolactone (ALDACTONE) 25 MG tablet Take 0.5 tablets (12.5 mg total) by mouth at bedtime. 30 tablet 5  . torsemide (DEMADEX) 20 MG tablet Take 2 tablets (40 mg total) by mouth daily. 60 tablet 6  . XARELTO 20 MG TABS tablet Take 1 tablet (20 mg total) by mouth daily. 30 tablet 0   No current facility-administered medications for this encounter.    Allergies:   Methocarbamol; Tramadol; and Bee venom   Social History:  The patient  reports that she quit smoking about 3 months ago. Her smoking use included cigarettes. She has a 8.50 pack-year smoking history. She has never used smokeless tobacco. She reports that she does not drink alcohol or use drugs.   Family History:  The patient's family history includes Hypertension in her father, mother, and sister.   ROS:  Please see the history of present illness.   All other systems are personally  reviewed and negative.  BP 127/74 HR 40s manually, 90s by EKG with frequent PVCs  Exam:  (Video/Tele Health Call; Exam is subjective and or/visual.) General: Well appearing. No resp difficulty. HEENT: Normal Neck: JVD not readily visible.  Cor: Irregular per paramedicine report, due to frequent ectopy by EKG Lungs: Normal respiratory effort with conversation.  Abdomen: Obese. Extremities: Pt and paramedic denies edema. Neuro: Alert & oriented x 3.   Recent Labs: 08/08/2018: B Natriuretic Peptide 810.5 08/11/2018: TSH 3.444 08/15/2018: Hemoglobin 14.6; Magnesium 2.4; Platelets 324 10/17/2018: BUN 12; Creatinine, Ser 1.22; Potassium 4.9; Sodium 137  Personally reviewed   Wt Readings from Last 3 Encounters:  11/07/18 134.2 kg (295 lb 12.8 oz)  10/17/18 134.7 kg (296 lb 14.4 oz)  10/10/18 135.2 kg (298 lb)    Other studies personally reviewed: Additional studies/ records that were reviewed today include: Recent Labwork. EKG, 3-lead via EMS   ASSESSMENT AND PLAN:  1. Chronic systolic HF due to NICM.Unclear etiology. ?viral illness in October vs  CPAP. Also noncompliant with CPAP. A1C only 7.2. HIV negative. Has HTN.  - Echo 12/25: EF 15-20%, severe diffuse HK and septal dyskinesis, grade 2 DD, mod MR, LA moderately dilated, RV moderately dilated with moderately reduced function, PA peak pressure 35 mm Hg. - R/LHC 12/27 showed marked volume overload with low output, severe NICM with EF 15%, and minimal non-obstructive CAD. -She has had a hysterectomy, so no need for birth control.  - No MRI with hip replacements.   - NYHA III-IIIb symptoms chronically. Volume status stable by report and by paramedic and visual assessment.  - Continue torsemide 40 mg daily, can take additional as needed.  -Continue digoxin 0.125 daily, dig level 0.5 on 1/14 - Will increase Coreg to 6.25 mg BID cautiously with frequent PVCs.  -Continue Entresto 49/51 mg BID.  - Continue spiro 12.5 mg daily. - Has been  to Dr. Broadus John for genetic screening with strong family history of NICM cardiomyopathy. (Her father has an LVAD and his mother had CHF) - Repeat echo once available weeks with Dr Haroldine Laws.  - Continue HF paramedicine - I do not know if she would be able to do a CPX test even on the bike with her joint problems (bilat hips and right knee).  - We discussed possibility of advanced therapies if her EF does not improve.  - Reinforced fluid restriction to < 2 L daily, sodium restriction to less than 2000 mg daily, and the importance of daily weights.   2. Minimal nonobstructive CAD: - LHC 12/27: 30% Prox LAD - No s/s of ischemia.    - Continue crestor. -No ASA with Xarelto use 3. DVT - Continue Xarelto 20 mg daily. Denies bleeding. Has not missed any doses.  4. OSA: - Continue nightly CPAP. She is awaiting BiPAP.  5. COPD: - Continue chantix. No change.  6. PVCs - Will get 3 day Zio patch to assess duration/quantity.  7. HTN - Meds as above.   COVID screen The patient does not have any symptoms that suggest any further testing/ screening at  this time.  Social distancing reinforced today.  Recommended follow-up:  In several weeks, based on results of Zio patch  Relevant cardiac medications were reviewed at length with the patient today.   The patient does not have concerns regarding their medications at this time.   The following changes were made today:  Increase in coreg.  Labs/ tests ordered today include: 3-day Zio patch.  Patient Risk:  After full review of this patients clinical status, I feel that they are at moderate/high risk for cardiac decompensation at this time.  Today, I have spent 25 minutes with the patient with paramedicine and telehealth technology discussing her HF and palpitations.    Jacalyn Lefevre, PA-C  11/07/2018 1:11 PM  [facility] [address] [facility] and [place] [address] [phone] (office) [phone] (fax)

## 2018-11-08 ENCOUNTER — Other Ambulatory Visit (HOSPITAL_COMMUNITY): Payer: Self-pay | Admitting: *Deleted

## 2018-11-08 DIAGNOSIS — I5022 Chronic systolic (congestive) heart failure: Secondary | ICD-10-CM | POA: Insufficient documentation

## 2018-11-08 NOTE — Addendum Note (Signed)
Encounter addended by: Shirley Friar, PA-C on: 11/08/2018 9:20 AM  Actions taken: Problem List modified, Visit diagnoses modified

## 2018-11-14 ENCOUNTER — Telehealth (HOSPITAL_COMMUNITY): Payer: Self-pay

## 2018-11-14 NOTE — Telephone Encounter (Signed)
I called Anna Rowe to see if she was available for an appointment. She did not answer immediately but called me back about 20 minutes later and advised that she was available however I was already on the way to another appointment. She stated she was not in need of anything this week and she had received her heart monitor and was wearing it. I advised that I would reach out to her next week for an appointment since she was not in need of assistance at this time and she was agreeable.

## 2018-11-15 IMAGING — DX DG PORTABLE PELVIS
1 series · 1 of 1 positions shown · non-contrast
Comparison: None.

CLINICAL DATA: Postop left hip replacement

EXAM:
PORTABLE PELVIS 1-2 VIEWS

[pelvis ap]
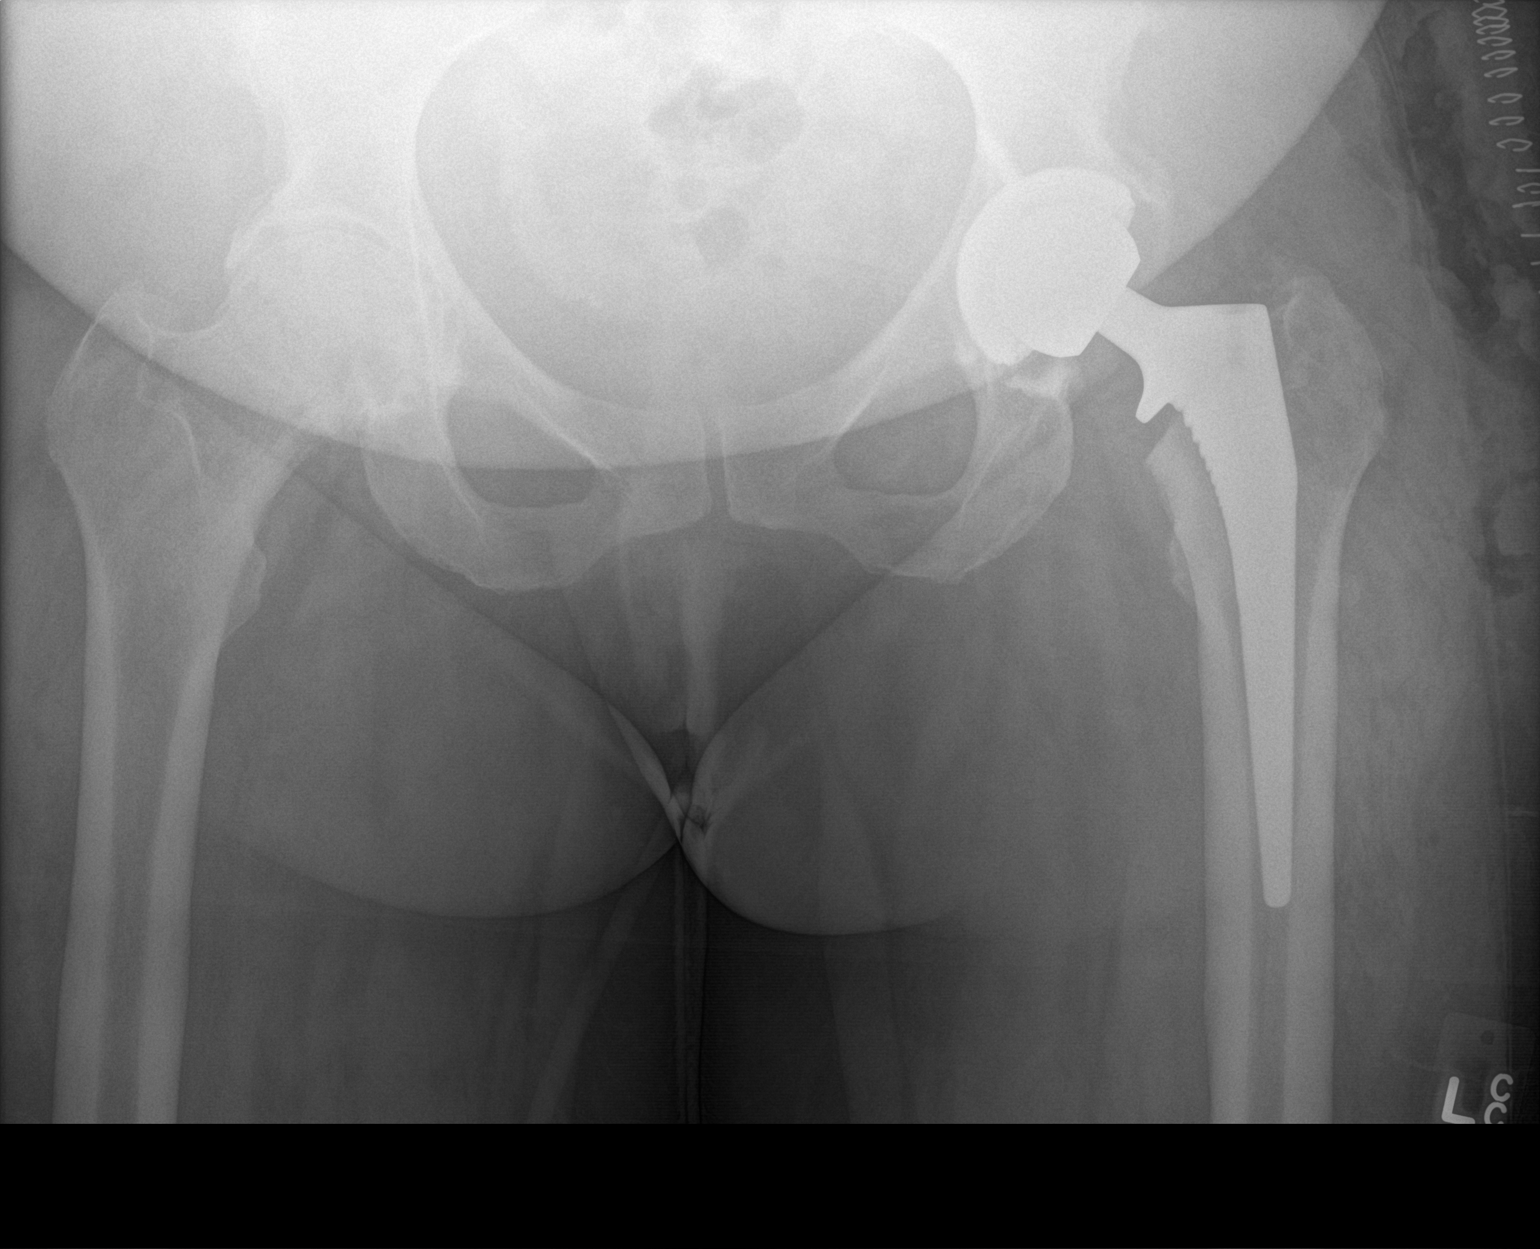

[1 of 1 positions shown; findings below may reference images not displayed]

FINDINGS: Left total hip arthroplasty is in place. There is anatomic alignment
of the osseous and prostatic structures. No breakage or loosening of
the hardware.
IMPRESSION: Left total hip arthroplasty anatomically aligned.

## 2018-11-21 ENCOUNTER — Encounter (HOSPITAL_COMMUNITY): Payer: Self-pay

## 2018-11-21 ENCOUNTER — Ambulatory Visit (HOSPITAL_COMMUNITY)
Admission: RE | Admit: 2018-11-21 | Discharge: 2018-11-21 | Disposition: A | Discharge status: Home / Self Care | Payer: 59 | Source: Ambulatory Visit | Attending: Internal Medicine | Admitting: Internal Medicine

## 2018-11-21 ENCOUNTER — Other Ambulatory Visit (HOSPITAL_COMMUNITY): Payer: Self-pay

## 2018-11-21 ENCOUNTER — Other Ambulatory Visit: Payer: Self-pay

## 2018-11-21 VITALS — BP 128/76 | HR 50 | Wt 290.0 lb

## 2018-11-21 DIAGNOSIS — I493 Ventricular premature depolarization: Secondary | ICD-10-CM | POA: Diagnosis not present

## 2018-11-21 DIAGNOSIS — I5022 Chronic systolic (congestive) heart failure: Secondary | ICD-10-CM | POA: Diagnosis not present

## 2018-11-21 DIAGNOSIS — I2510 Atherosclerotic heart disease of native coronary artery without angina pectoris: Secondary | ICD-10-CM

## 2018-11-21 NOTE — Patient Instructions (Addendum)
Follow up 3 weeks with Dr Haroldine Laws for televisit with Yevonne Aline.  You have an appointment on 12/12/18.   We will let you know Zio Patch results when get them.   Call office if you have any questions or concerns.

## 2018-11-21 NOTE — Progress Notes (Signed)
Paramedicine Encounter    Patient ID: Anna Rowe, female    DOB: Sep 25, 1975, 43 y.o.   MRN: [id number]   Patient Care Team: [facility], [place] as PCP - General  Patient Active Problem List   Diagnosis Date Noted  . Chronic systolic (congestive) heart failure ([place]) 11/08/2018  . CHF (congestive heart failure) ([place]) 08/09/2018  . Acute diastolic CHF (congestive heart failure) ([place]) 08/08/2018  . DM2 (diabetes mellitus, type 2) ([place]) 08/08/2018  . History of DVT (deep vein thrombosis) 08/08/2018  . Unilateral primary osteoarthritis, left knee 08/24/2017  . History of total right knee replacement 08/24/2017  . Chronic bilateral low back pain 08/24/2017  . Status post total replacement of right hip 07/14/2017  . Status post total replacement of left hip 04/28/2017  . Unilateral primary osteoarthritis, left hip 03/09/2017  . Unilateral primary osteoarthritis, right hip 03/09/2017  . Primary osteoarthritis of right knee 04/04/2016    Current Outpatient Medications:  .  amitriptyline (ELAVIL) 50 MG tablet, Take 1 tablet (50 mg total) by mouth at bedtime., Disp: 30 tablet, Rfl: 2 .  carvedilol (COREG) 6.25 MG tablet, Take 1 tablet (6.25 mg total) by mouth 2 (two) times daily with a meal., Disp: 60 tablet, Rfl: 6 .  CHANTIX CONTINUING MONTH PAK 1 MG tablet, Take 1 tablet (1 mg total) by mouth 2 (two) times daily., Disp: 60 tablet, Rfl: 0 .  cholecalciferol (VITAMIN D3) 25 MCG (1000 UT) tablet, Take 3,000 Units by mouth daily., Disp: , Rfl:  .  cyclobenzaprine (FLEXERIL) 10 MG tablet, Take 10 mg by mouth 3 (three) times daily., Disp: , Rfl: 1 .  digoxin (LANOXIN) 0.125 MG tablet, Take 1 tablet (0.125 mg total) by mouth daily., Disp: 30 tablet, Rfl: 5 .  docusate sodium (COLACE) 100 MG capsule, Take 100 mg by mouth 2 (two) times daily. , Disp: , Rfl: 6 .  gabapentin (NEURONTIN) 600 MG tablet, Take 600 mg by mouth 4 (four) times daily. , Disp: , Rfl: 3 .   ipratropium-albuterol (DUONEB) 0.5-2.5 (3) MG/3ML SOLN, Take 3 mLs by nebulization 2 (two) times daily., Disp: 360 mL, Rfl: 0 .  lubiprostone (AMITIZA) 24 MCG capsule, Take 24 mcg by mouth 2 (two) times daily with a meal., Disp: , Rfl:  .  Meloxicam 15 MG TBDP, Take 15 mg by mouth daily., Disp: , Rfl:  .  metFORMIN (GLUCOPHAGE-XR) 500 MG 24 hr tablet, Take 500 mg 2 (two) times daily by mouth. , Disp: , Rfl: 3 .  mometasone-formoterol (DULERA) 200-5 MCG/ACT AERO, Inhale 2 puffs into the lungs 2 (two) times daily., Disp: 2 Inhaler, Rfl: 0 .  Multiple Vitamin (MULTIVITAMIN WITH MINERALS) TABS tablet, Take 1 tablet by mouth daily., Disp: , Rfl:  .  oxyCODONE-acetaminophen (PERCOCET/ROXICET) 5-325 MG tablet, Take 1 tablet by mouth 4 (four) times daily., Disp: , Rfl:  .  OXYCONTIN 10 MG 12 hr tablet, Take 10 mg by mouth every 12 (twelve) hours., Disp: , Rfl: 0 .  potassium chloride SA (K-DUR,KLOR-CON) 20 MEQ tablet, Take 1 tablet (20 mEq total) by mouth 2 (two) times daily., Disp: 60 tablet, Rfl: 5 .  rosuvastatin (CRESTOR) 10 MG tablet, Take 1 tablet (10 mg total) by mouth at bedtime., Disp: 30 tablet, Rfl: 5 .  sacubitril-valsartan (ENTRESTO) 49-51 MG, Take 1 tablet by mouth 2 (two) times daily., Disp: 60 tablet, Rfl: 3 .  spironolactone (ALDACTONE) 25 MG tablet, Take 0.5 tablets (12.5 mg total) by mouth at bedtime., Disp: 30 tablet, Rfl:  5 .  torsemide (DEMADEX) 20 MG tablet, Take 2 tablets (40 mg total) by mouth daily., Disp: 60 tablet, Rfl: 6 .  XARELTO 20 MG TABS tablet, Take 1 tablet (20 mg total) by mouth daily., Disp: 30 tablet, Rfl: 0 Allergies  Allergen Reactions  . Methocarbamol Anaphylaxis and Swelling  . Tramadol Anaphylaxis    Tolerates Dilaudid, Oxycontin 04/04/16  . Bee Venom Hives      Social History   Socioeconomic History  . Marital status: Married    Spouse name: Not on file  . Number of children: Not on file  . Years of education: Not on file  . Highest education level:  Not on file  Occupational History  . Not on file  Social Needs  . Financial resource strain: Not on file  . Food insecurity:    Worry: Not on file    Inability: Not on file  . Transportation needs:    Medical: Not on file    Non-medical: Not on file  Tobacco Use  . Smoking status: Former Smoker    Packs/day: 0.50    Years: 17.00    Pack years: 8.50    Types: Cigarettes    Last attempt to quit: 08/07/2018    Years since quitting: 0.2  . Smokeless tobacco: Never Used  . Tobacco comment: 3 Loose cigarettes in last week  Substance and Sexual Activity  . Alcohol use: No    Comment: seldom   . Drug use: No  . Sexual activity: Not Currently  Lifestyle  . Physical activity:    Days per week: Not on file    Minutes per session: Not on file  . Stress: Not on file  Relationships  . Social connections:    Talks on phone: Not on file    Gets together: Not on file    Attends religious service: Not on file    Active member of club or organization: Not on file    Attends meetings of clubs or organizations: Not on file    Relationship status: Not on file  . Intimate partner violence:    Fear of current or ex partner: Not on file    Emotionally abused: Not on file    Physically abused: Not on file    Forced sexual activity: Not on file  Other Topics Concern  . Not on file  Social History Narrative  . Not on file    Physical Exam Cardiovascular:     Rate and Rhythm: Bradycardia present. Rhythm irregular.  Pulmonary:     Effort: Pulmonary effort is normal.     Breath sounds: Normal breath sounds.  Musculoskeletal: Normal range of motion.     Right lower leg: No edema.     Left lower leg: No edema.  Skin:    General: Skin is warm and dry.     Capillary Refill: Capillary refill takes less than 2 seconds.  Neurological:     Mental Status: She is alert and oriented to person, place, and time.  Psychiatric:        Mood and Affect: Mood normal.         Future  Appointments  Date Time Provider [place]  12/12/2018 10:00 AM MC ECHO 1-BUZZ [facility]ECH[facility]  12/12/2018 11:00 AM Bensimhon, Shaune Pascal, MD MC-HVSC None    BP 128/76 (BP Location: Right Arm, Patient Position: Sitting, Cuff Size: Normal)   Pulse (!) 50   Resp 16   Wt 290 lb 6.4 oz (131.7  kg)   SpO2 98%   BMI 39.39 kg/m   Weight yesterday- 292 lb Last visit weight- 295 lb  Ms Gates was seen at home today and reported feeling generally well. She denied chest pain, headache, dizziness, orthopnea, cough or fever. She did said she has exertional dyspnea with ADL's. We had a virtual visit with Darrick Grinder, NP, who did not make any medication changes today, as she is waiting on the results of her heart monitor to come back. I will follow up next week.   Jacquiline Doe, EMT 11/21/18  ACTION: Home visit completed Next visit planned for 1 week

## 2018-11-21 NOTE — Progress Notes (Signed)
Spoke with patient, discussed AVS.  Pt will wait for call to discuss zio patch results. Verbalized understanding.

## 2018-11-21 NOTE — Addendum Note (Signed)
Encounter addended by: Valeda Malm, RN on: 11/21/2018 3:34 PM  Actions taken: Clinical Note Signed

## 2018-11-21 NOTE — Progress Notes (Signed)
Heart Failure TeleHealth Note  Due to national recommendations of social distancing due to [place] 19, Audio/video telehealth visit is felt to be most appropriate for this patient at this time.  See MyChart message from today for patient consent regarding telehealth for [facility] . I discussed the limitations, risks, security and privacy concerns of performing an evaluation and management service by telephone/video and the availability of in person appointments. I also discussed with the patient that there may be a patient responsible charge related to this service. The patient expressed understanding and agreed to proceed.  Date:  11/21/2018   ID:  Anna Rowe, DOB [birth year], MRN [id number]  Location: Home  Provider location: [place] [facility] Heart Failure Type of Visit: Established patient, etc  PCP:  [facility] [place]  Cardiologist:  No primary care provider on file. Primary HF: Bensimhon  Chief Complaint:  Heart Failure   History of Present Illness: Anna Rowe is a 43 y.o. female with a history of hyperlipidemia, DM, COPD, sleep apnea noncompliant with CPAP, chronic DVT(diagnosed 2018)on Xarelto, tobacco use,right knee replacement 2015,and COPD.She has had RTKR and R/L hip replacements. Most recent ECHo 08/08/18 that showed EF 15-20%.   She had virtual visit on 11/07/18. At that time she was having frequent PVCs so Xio Patch was ordered. Coreg was alos increased to 6.25 mg twice a day.   [name] presents via Administrator for a telehealth visit today.  She wore zio patch for 3 days.  Overall feeling fine. Denies SOB/PND/Orthopnea. Denies palpitations.  Appetite ok. No fever or chills. Weight at home 295-298 pounds. Taking all medications.    She denies symptoms worrisome for COVID 19.  Cath 12/27 Findings: Ao = 99/75 (81) LV = 93/31 RA = 22 RV = 54/25 PA = 58/24 (41) PCW = 30 Fick cardiac output/index = 4.1/1.6  SVR = 1163 PVR = 2.6 FA sat = 89% PA sat = 42%, 44% Assessment: 1. 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 Severe NICM with LVEF ~ 15% 3. Marked volume overload with low output Past Medical History:  Diagnosis Date  . Arthritis   . CHF (congestive heart failure) ([place])   . COPD (chronic obstructive pulmonary disease) ([place])   . Diabetes mellitus ([place])    type 2   . DVT (deep venous thrombosis) ([place]) dx 10-2016   RLE   . HLD (hyperlipidemia)    on crestor  . Pneumonia    09/2016  . Sleep apnea    per patient ;been dx does not use CPAP does not tolerate   Past Surgical History:  Procedure Laterality Date  . ACHILLES TENDON REPAIR    . HERNIA REPAIR     x2; hiatal and umbilical   . IVC FILTER INSERTION N/A 04/25/2017   Procedure: IVC Filter Insertion;  Surgeon: Serafina Mitchell, MD;  Location: [place] [facility];  Service: Cardiovascular;  Laterality: N/A;  . IVC FILTER REMOVAL N/A 10/24/2017   Procedure: IVC FILTER REMOVAL;  Surgeon: Serafina Mitchell, MD;  Location: [facility];  Service: Cardiovascular;  Laterality: N/A;  . JOINT REPLACEMENT     right hip 07/14/17 Dr. Ninfa Linden  . KNEE ARTHROSCOPY Right 2015  . plantar    . RIGHT/LEFT HEART CATH AND CORONARY ANGIOGRAPHY N/A 08/10/2018   Procedure: RIGHT/LEFT HEART CATH AND CORONARY ANGIOGRAPHY;  Surgeon: Jolaine Artist, MD;  Location: [facility];  Service: Cardiovascular;  Laterality: N/A;  . TOTAL HIP ARTHROPLASTY Left  04/28/2017   Procedure: LEFT TOTAL HIP ARTHROPLASTY ANTERIOR APPROACH;  Surgeon: Mcarthur Rossetti, MD;  Location: [facility];  Service: Orthopedics;  Laterality: Left;  . TOTAL HIP ARTHROPLASTY Right 07/14/2017   Procedure: RIGHT TOTAL HIP ARTHROPLASTY ANTERIOR APPROACH;  Surgeon: Mcarthur Rossetti, MD;  Location: [facility];  Service: Orthopedics;  Laterality: Right;  . TOTAL KNEE ARTHROPLASTY Right 04/04/2016   Procedure: TOTAL KNEE ARTHROPLASTY;  Surgeon: Dorna Leitz, MD;  Location: [place];  Service: Orthopedics;  Laterality: Right;     Current Outpatient Medications  Medication Sig Dispense Refill  . amitriptyline (ELAVIL) 50 MG tablet Take 1 tablet (50 mg total) by mouth at bedtime. 30 tablet 2  . carvedilol (COREG) 6.25 MG tablet Take 1 tablet (6.25 mg total) by mouth 2 (two) times daily with a meal. 60 tablet 6  . CHANTIX CONTINUING MONTH PAK 1 MG tablet Take 1 tablet (1 mg total) by mouth 2 (two) times daily. (Patient not taking: Reported on 11/21/2018) 60 tablet 0  . cholecalciferol (VITAMIN D3) 25 MCG (1000 UT) tablet Take 3,000 Units by mouth daily.    . cyclobenzaprine (FLEXERIL) 10 MG tablet Take 10 mg by mouth 3 (three) times daily.  1  . digoxin (LANOXIN) 0.125 MG tablet Take 1 tablet (0.125 mg total) by mouth daily. 30 tablet 5  . docusate sodium (COLACE) 100 MG capsule Take 100 mg by mouth 2 (two) times daily.   6  . gabapentin (NEURONTIN) 600 MG tablet Take 600 mg by mouth 4 (four) times daily.   3  . ipratropium-albuterol (DUONEB) 0.5-2.5 (3) MG/3ML SOLN Take 3 mLs by nebulization 2 (two) times daily. 360 mL 0  . lubiprostone (AMITIZA) 24 MCG capsule Take 24 mcg by mouth 2 (two) times daily with a meal.    . Meloxicam 15 MG TBDP Take 15 mg by mouth daily.    . metFORMIN (GLUCOPHAGE-XR) 500 MG 24 hr tablet Take 500 mg 2 (two) times daily by mouth.   3  . mometasone-formoterol (DULERA) 200-5 MCG/ACT AERO Inhale 2 puffs into the lungs 2 (two) times daily. 2 Inhaler 0  . Multiple Vitamin (MULTIVITAMIN WITH MINERALS) TABS tablet Take 1 tablet by mouth daily.    Marland Kitchen oxyCODONE-acetaminophen (PERCOCET/ROXICET) 5-325 MG tablet Take 1 tablet by mouth 4 (four) times daily.    . OXYCONTIN 10 MG 12 hr tablet Take 10 mg by mouth every 12 (twelve) hours.  0  . potassium chloride SA (K-DUR,KLOR-CON) 20 MEQ tablet Take 1 tablet (20 mEq total) by mouth 2 (two) times daily. 60 tablet 5  . rosuvastatin (CRESTOR) 10 MG tablet Take 1 tablet (10 mg total) by mouth at bedtime. 30 tablet  5  . sacubitril-valsartan (ENTRESTO) 49-51 MG Take 1 tablet by mouth 2 (two) times daily. 60 tablet 3  . spironolactone (ALDACTONE) 25 MG tablet Take 0.5 tablets (12.5 mg total) by mouth at bedtime. 30 tablet 5  . torsemide (DEMADEX) 20 MG tablet Take 2 tablets (40 mg total) by mouth daily. 60 tablet 6  . XARELTO 20 MG TABS tablet Take 1 tablet (20 mg total) by mouth daily. 30 tablet 0   No current facility-administered medications for this encounter.     Allergies:   Methocarbamol; Tramadol; and Bee venom   Social History:  The patient  reports that she quit smoking about 3 months ago. Her smoking use included cigarettes. She has a 8.50 pack-year smoking history. She has never used smokeless tobacco. She reports that she does  not drink alcohol or use drugs.   Family History:  The patient's family history includes Hypertension in her father, mother, and sister.   ROS:  Please see the history of present illness.   All other systems are personally reviewed and negative.   Exam: VideoHealth Call; Exam is visual with HF Paramedic General:  Speaks in full sentences. No resp difficulty. Lungs: Normal respiratory effort with conversation.  Abdomen: Non-distended per patient report Extremities: Pt denies edema. Neuro: Alert & oriented x 3.   Recent Labs: 08/08/2018: B Natriuretic Peptide 810.5 08/11/2018: TSH 3.444 08/15/2018: Hemoglobin 14.6; Magnesium 2.4; Platelets 324 10/17/2018: BUN 12; Creatinine, Ser 1.22; Potassium 4.9; Sodium 137  Personally reviewed   Wt Readings from Last 3 Encounters:  11/07/18 134.2 kg (295 lb 12.8 oz)  10/17/18 134.7 kg (296 lb 14.4 oz)  10/10/18 135.2 kg (298 lb)    EKG: HR 49-50 Frequent PVCs Multifocal  ASSESSMENT AND PLAN:  1.  1. Chronic systolic HF due to NICM.Unclear etiology. ?viral illness in October vs CPAP. Also noncompliant with CPAP. A1C only 7.2. HIV negative. Has HTN.  - Echo 12/25: EF 15-20%, severe diffuse HK and septal dyskinesis, grade  2 DD, mod MR, LA moderately dilated, RV moderately dilated with moderately reduced function, PA peak pressure 35 mm Hg. - R/LHC 12/27 showed marked volume overload with low output, severe NICM with EF 15%, and minimal non-obstructive CAD. -She has had a hysterectomy, so no need for birth control.  -No MRI with hip replacements - Repeat ECHO when available. I am concerned she may have high PVC burden. May need AA.  Volume status stable. Continue current Hf regimen.   2. CAD, minimal nonobstructive LHC 07/2016 No s/s ischemia   Continue statin and xarelto.   3. PVCs  -Xio Patch ordered and she has sent back.  - Waiting on Xio Patch results. May need AA.   COVID screen The patient does not have any symptoms that suggest any further testing/ screening at this time.  Social distancing reinforced today.   Recommended follow-up:  Follow up in 3 weeks with tele visit with HF Paramedic and Dr Haroldine Laws. I will call with zio patch results. .   Relevant cardiac medications were reviewed at length with the patient today.   The patient does not have concerns regarding their medications at this time.   The following changes were made today:  As above  Today, I have spent 25 minutes with the patient with telehealth technology discussing the above issues . Personally discussed with HF Pararmedic EKG and physical exam.   Signed, Darrick Grinder, NP  11/21/2018 2:07 PM  [address] [place] [address] [place] [address] [phone] (office) [phone] (fax)

## 2018-11-26 ENCOUNTER — Other Ambulatory Visit: Payer: Self-pay

## 2018-11-26 ENCOUNTER — Ambulatory Visit (HOSPITAL_COMMUNITY)
Admission: RE | Admit: 2018-11-26 | Discharge: 2018-11-26 | Disposition: A | Discharge status: Home / Self Care | Payer: 59 | Source: Ambulatory Visit | Attending: Adult Health | Admitting: Adult Health

## 2018-11-26 DIAGNOSIS — I493 Ventricular premature depolarization: Secondary | ICD-10-CM

## 2018-11-26 NOTE — Addendum Note (Signed)
Encounter addended by: Scarlette Calico, RN on: 11/26/2018 2:38 PM  Actions taken: Order list changed, Diagnosis association updated

## 2018-11-27 ENCOUNTER — Telehealth (HOSPITAL_COMMUNITY): Payer: Self-pay

## 2018-11-27 NOTE — Telephone Encounter (Signed)
I called Anna Rowe to see how she has been feeling over the past several days. She stated that she is generally feeling well but did have an episode of her "heart pounding" yesterday morning. She stated had just eaten breakfast and was laying down when the "pounding" began and it lasted "about 2-3 minutes." She did not feel SOB or dizzy at the time of this event and it has not repeated. I advised that I would be out tomorrow to see her around 13:00 and she was agreeable.

## 2018-11-28 ENCOUNTER — Other Ambulatory Visit (HOSPITAL_COMMUNITY): Payer: Self-pay

## 2018-11-28 NOTE — Progress Notes (Signed)
Paramedicine Encounter    Patient ID: Anna Rowe, female    DOB: [birth year], 43 y.o.   MRN: [id number]   Patient Care Team: [facility] [place] as PCP - General  Patient Active Problem List   Diagnosis Date Noted  . Chronic systolic (congestive) heart failure ([place]) 11/08/2018  . CHF (congestive heart failure) ([place]) 08/09/2018  . Acute diastolic CHF (congestive heart failure) ([place]) 08/08/2018  . DM2 (diabetes mellitus, type 2) ([place]) 08/08/2018  . History of DVT (deep vein thrombosis) 08/08/2018  . Unilateral primary osteoarthritis, left knee 08/24/2017  . History of total right knee replacement 08/24/2017  . Chronic bilateral low back pain 08/24/2017  . Status post total replacement of right hip 07/14/2017  . Status post total replacement of left hip 04/28/2017  . Unilateral primary osteoarthritis, left hip 03/09/2017  . Unilateral primary osteoarthritis, right hip 03/09/2017  . Primary osteoarthritis of right knee 04/04/2016    Current Outpatient Medications:  .  amitriptyline (ELAVIL) 50 MG tablet, Take 1 tablet (50 mg total) by mouth at bedtime., Disp: 30 tablet, Rfl: 2 .  carvedilol (COREG) 6.25 MG tablet, Take 1 tablet (6.25 mg total) by mouth 2 (two) times daily with a meal., Disp: 60 tablet, Rfl: 6 .  cholecalciferol (VITAMIN D3) 25 MCG (1000 UT) tablet, Take 3,000 Units by mouth daily., Disp: , Rfl:  .  cyclobenzaprine (FLEXERIL) 10 MG tablet, Take 10 mg by mouth 3 (three) times daily., Disp: , Rfl: 1 .  digoxin (LANOXIN) 0.125 MG tablet, Take 1 tablet (0.125 mg total) by mouth daily., Disp: 30 tablet, Rfl: 5 .  docusate sodium (COLACE) 100 MG capsule, Take 100 mg by mouth 2 (two) times daily. , Disp: , Rfl: 6 .  gabapentin (NEURONTIN) 600 MG tablet, Take 600 mg by mouth 4 (four) times daily. , Disp: , Rfl: 3 .  ipratropium-albuterol (DUONEB) 0.5-2.5 (3) MG/3ML SOLN, Take 3 mLs by nebulization 2 (two) times daily., Disp: 360 mL, Rfl: 0 .  lubiprostone  (AMITIZA) 24 MCG capsule, Take 24 mcg by mouth 2 (two) times daily with a meal., Disp: , Rfl:  .  Meloxicam 15 MG TBDP, Take 15 mg by mouth daily., Disp: , Rfl:  .  metFORMIN (GLUCOPHAGE-XR) 500 MG 24 hr tablet, Take 500 mg 2 (two) times daily by mouth. , Disp: , Rfl: 3 .  mometasone-formoterol (DULERA) 200-5 MCG/ACT AERO, Inhale 2 puffs into the lungs 2 (two) times daily., Disp: 2 Inhaler, Rfl: 0 .  Multiple Vitamin (MULTIVITAMIN WITH MINERALS) TABS tablet, Take 1 tablet by mouth daily., Disp: , Rfl:  .  oxyCODONE-acetaminophen (PERCOCET/ROXICET) 5-325 MG tablet, Take 1 tablet by mouth 4 (four) times daily., Disp: , Rfl:  .  OXYCONTIN 10 MG 12 hr tablet, Take 10 mg by mouth every 12 (twelve) hours., Disp: , Rfl: 0 .  potassium chloride SA (K-DUR,KLOR-CON) 20 MEQ tablet, Take 1 tablet (20 mEq total) by mouth 2 (two) times daily., Disp: 60 tablet, Rfl: 5 .  rosuvastatin (CRESTOR) 10 MG tablet, Take 1 tablet (10 mg total) by mouth at bedtime., Disp: 30 tablet, Rfl: 5 .  sacubitril-valsartan (ENTRESTO) 49-51 MG, Take 1 tablet by mouth 2 (two) times daily., Disp: 60 tablet, Rfl: 3 .  spironolactone (ALDACTONE) 25 MG tablet, Take 0.5 tablets (12.5 mg total) by mouth at bedtime., Disp: 30 tablet, Rfl: 5 .  torsemide (DEMADEX) 20 MG tablet, Take 2 tablets (40 mg total) by mouth daily., Disp: 60 tablet, Rfl: 6 .  XARELTO 20  MG TABS tablet, Take 1 tablet (20 mg total) by mouth daily., Disp: 30 tablet, Rfl: 0 .  CHANTIX CONTINUING MONTH PAK 1 MG tablet, Take 1 tablet (1 mg total) by mouth 2 (two) times daily. (Patient not taking: Reported on 11/21/2018), Disp: 60 tablet, Rfl: 0 Allergies  Allergen Reactions  . Methocarbamol Anaphylaxis and Swelling  . Tramadol Anaphylaxis    Tolerates Dilaudid, Oxycontin 04/04/16  . Bee Venom Hives      Social History   Socioeconomic History  . Marital status: Married    Spouse name: Not on file  . Number of children: Not on file  . Years of education: Not on file   . Highest education level: Not on file  Occupational History  . Not on file  Social Needs  . Financial resource strain: Not on file  . Food insecurity:    Worry: Not on file    Inability: Not on file  . Transportation needs:    Medical: Not on file    Non-medical: Not on file  Tobacco Use  . Smoking status: Former Smoker    Packs/day: 0.50    Years: 17.00    Pack years: 8.50    Types: Cigarettes    Last attempt to quit: 08/07/2018    Years since quitting: 0.3  . Smokeless tobacco: Never Used  . Tobacco comment: 3 Loose cigarettes in last week  Substance and Sexual Activity  . Alcohol use: No    Comment: seldom   . Drug use: No  . Sexual activity: Not Currently  Lifestyle  . Physical activity:    Days per week: Not on file    Minutes per session: Not on file  . Stress: Not on file  Relationships  . Social connections:    Talks on phone: Not on file    Gets together: Not on file    Attends religious service: Not on file    Active member of club or organization: Not on file    Attends meetings of clubs or organizations: Not on file    Relationship status: Not on file  . Intimate partner violence:    Fear of current or ex partner: Not on file    Emotionally abused: Not on file    Physically abused: Not on file    Forced sexual activity: Not on file  Other Topics Concern  . Not on file  Social History Narrative  . Not on file    Physical Exam Cardiovascular:     Rate and Rhythm: Bradycardia present. Rhythm irregular.     Pulses: Normal pulses.  Pulmonary:     Effort: Pulmonary effort is normal.     Breath sounds: Normal breath sounds.  Musculoskeletal: Normal range of motion.     Right lower leg: Edema present.     Left lower leg: Edema present.  Skin:    General: Skin is warm and dry.     Capillary Refill: Capillary refill takes less than 2 seconds.  Neurological:     Mental Status: She is alert and oriented to person, place, and time.  Psychiatric:         Mood and Affect: Mood normal.         Future Appointments  Date Time Provider [place]  12/12/2018 10:00 AM MC ECHO 1-BUZZ [facility]ECH[facility]  12/12/2018 11:00 AM Bensimhon, Shaune Pascal, MD MC-HVSC None    BP 126/70 (BP Location: Left Arm, Patient Position: Sitting, Cuff Size: Normal)   Pulse Marland Kitchen)  50   Resp 18   Wt 291 lb 6.4 oz (132.2 kg)   SpO2 99%   BMI 39.52 kg/m   Weight yesterday- 292 lb Last visit weight- 290 lb  Anna Rowe was seen at home today and reported feeling well. She denied chest pain, SOB, headache, dizziness, orthopnea, cough or fever. She stated she has been compliant with her medications and her weight has been stable. Her medications were verified and she continues to have her sister fill her pillbox. I contacted the HF clinic and they advised her heart monitor report had come back but was yet to be reviewed by Dr Haroldine Laws. I advised that I would contact her as soon as I heard if there is a decision to change her medications. I will follow up next week.   Jacquiline Doe, EMT 11/28/18  ACTION: Home visit completed Next visit planned for 1 week

## 2018-12-04 ENCOUNTER — Telehealth (HOSPITAL_COMMUNITY): Payer: Self-pay

## 2018-12-04 NOTE — Telephone Encounter (Signed)
I called Anna Rowe to schedule an appointment for tomorrow. She stated she would be able to meet any time so we agreed to meet around 14:00 and 15:00.

## 2018-12-05 ENCOUNTER — Telehealth (HOSPITAL_COMMUNITY): Payer: Self-pay | Admitting: Cardiology

## 2018-12-05 ENCOUNTER — Other Ambulatory Visit (HOSPITAL_COMMUNITY): Payer: Self-pay

## 2018-12-05 MED ORDER — AMIODARONE HCL 200 MG PO TABS: 200.0000 mg | ORAL_TABLET | Freq: Two times a day (BID) | ORAL | 3 refills | Status: DC

## 2018-12-05 NOTE — Telephone Encounter (Signed)
Patient aware of monitor results and voiced understanding

## 2018-12-06 NOTE — Progress Notes (Signed)
Anna Rowe was seen briefly to discuss the results of her cardiac monitor and the treatment plan being put in place by the HF clinic. I advised that she should begin taking amiodarone 200 mg BID. I advised that we would set up another virtual visit in two weeks to assess the efficacy of the medications change. She was understanding and agreeable.

## 2018-12-12 ENCOUNTER — Other Ambulatory Visit: Payer: Self-pay

## 2018-12-12 ENCOUNTER — Encounter (HOSPITAL_COMMUNITY): Payer: BC Managed Care – PPO | Admitting: Internal Medicine

## 2018-12-12 ENCOUNTER — Ambulatory Visit (HOSPITAL_COMMUNITY): Payer: 59

## 2018-12-12 ENCOUNTER — Ambulatory Visit (HOSPITAL_COMMUNITY)
Admission: RE | Admit: 2018-12-12 | Discharge: 2018-12-12 | Disposition: A | Discharge status: Home / Self Care | Payer: 59 | Source: Ambulatory Visit | Attending: Internal Medicine | Admitting: Internal Medicine

## 2018-12-12 VITALS — BP 116/78 | HR 53 | Wt 295.4 lb

## 2018-12-12 DIAGNOSIS — Z86718 Personal history of other venous thrombosis and embolism: Secondary | ICD-10-CM | POA: Diagnosis not present

## 2018-12-12 DIAGNOSIS — Z87891 Personal history of nicotine dependence: Secondary | ICD-10-CM | POA: Insufficient documentation

## 2018-12-12 DIAGNOSIS — Z79899 Other long term (current) drug therapy: Secondary | ICD-10-CM | POA: Insufficient documentation

## 2018-12-12 DIAGNOSIS — I5022 Chronic systolic (congestive) heart failure: Secondary | ICD-10-CM | POA: Diagnosis not present

## 2018-12-12 DIAGNOSIS — Z8249 Family history of ischemic heart disease and other diseases of the circulatory system: Secondary | ICD-10-CM | POA: Insufficient documentation

## 2018-12-12 DIAGNOSIS — Z7984 Long term (current) use of oral hypoglycemic drugs: Secondary | ICD-10-CM | POA: Insufficient documentation

## 2018-12-12 DIAGNOSIS — E119 Type 2 diabetes mellitus without complications: Secondary | ICD-10-CM | POA: Insufficient documentation

## 2018-12-12 DIAGNOSIS — I428 Other cardiomyopathies: Secondary | ICD-10-CM | POA: Diagnosis not present

## 2018-12-12 DIAGNOSIS — Z885 Allergy status to narcotic agent status: Secondary | ICD-10-CM | POA: Insufficient documentation

## 2018-12-12 DIAGNOSIS — Z7951 Long term (current) use of inhaled steroids: Secondary | ICD-10-CM | POA: Insufficient documentation

## 2018-12-12 DIAGNOSIS — I2510 Atherosclerotic heart disease of native coronary artery without angina pectoris: Secondary | ICD-10-CM | POA: Insufficient documentation

## 2018-12-12 DIAGNOSIS — M1990 Unspecified osteoarthritis, unspecified site: Secondary | ICD-10-CM | POA: Diagnosis not present

## 2018-12-12 DIAGNOSIS — Z7901 Long term (current) use of anticoagulants: Secondary | ICD-10-CM | POA: Diagnosis not present

## 2018-12-12 DIAGNOSIS — G4733 Obstructive sleep apnea (adult) (pediatric): Secondary | ICD-10-CM | POA: Insufficient documentation

## 2018-12-12 DIAGNOSIS — J449 Chronic obstructive pulmonary disease, unspecified: Secondary | ICD-10-CM | POA: Diagnosis not present

## 2018-12-12 DIAGNOSIS — I110 Hypertensive heart disease with heart failure: Secondary | ICD-10-CM | POA: Diagnosis present

## 2018-12-12 DIAGNOSIS — I493 Ventricular premature depolarization: Secondary | ICD-10-CM

## 2018-12-12 DIAGNOSIS — R5383 Other fatigue: Secondary | ICD-10-CM

## 2018-12-12 DIAGNOSIS — Z9119 Patient's noncompliance with other medical treatment and regimen: Secondary | ICD-10-CM | POA: Diagnosis not present

## 2018-12-12 DIAGNOSIS — Z91030 Bee allergy status: Secondary | ICD-10-CM | POA: Insufficient documentation

## 2018-12-12 DIAGNOSIS — Z791 Long term (current) use of non-steroidal anti-inflammatories (NSAID): Secondary | ICD-10-CM | POA: Insufficient documentation

## 2018-12-12 DIAGNOSIS — E785 Hyperlipidemia, unspecified: Secondary | ICD-10-CM | POA: Diagnosis not present

## 2018-12-12 DIAGNOSIS — I472 Ventricular tachycardia: Secondary | ICD-10-CM | POA: Insufficient documentation

## 2018-12-12 DIAGNOSIS — Z888 Allergy status to other drugs, medicaments and biological substances status: Secondary | ICD-10-CM | POA: Insufficient documentation

## 2018-12-12 LAB — CBC
HCT: 42.1 % (ref 36.0–46.0)
Hemoglobin: 12.8 g/dL (ref 12.0–15.0)
MCH: 22.8 pg — ABNORMAL LOW (ref 26.0–34.0)
MCHC: 30.4 g/dL (ref 30.0–36.0)
MCV: 74.9 fL — ABNORMAL LOW (ref 80.0–100.0)
Platelets: 393 10*3/uL (ref 150–400)
RBC: 5.62 MIL/uL — ABNORMAL HIGH (ref 3.87–5.11)
RDW: 21.8 % — ABNORMAL HIGH (ref 11.5–15.5)
WBC: 11.4 10*3/uL — ABNORMAL HIGH (ref 4.0–10.5)
nRBC: 0.2 % (ref 0.0–0.2)

## 2018-12-12 LAB — COMPREHENSIVE METABOLIC PANEL
ALT: 19 U/L (ref 0–44)
AST: 25 U/L (ref 15–41)
Albumin: 3.8 g/dL (ref 3.5–5.0)
Alkaline Phosphatase: 87 U/L (ref 38–126)
Anion gap: 14 (ref 5–15)
BUN: 13 mg/dL (ref 6–20)
CO2: 24 mmol/L (ref 22–32)
Calcium: 9.6 mg/dL (ref 8.9–10.3)
Chloride: 98 mmol/L (ref 98–111)
Creatinine, Ser: 1.29 mg/dL — ABNORMAL HIGH (ref 0.44–1.00)
GFR calc Af Amer: 59 mL/min — ABNORMAL LOW (ref >60)
GFR calc non Af Amer: 51 mL/min — ABNORMAL LOW (ref >60)
Glucose, Bld: 368 mg/dL — ABNORMAL HIGH (ref 70–99)
Potassium: 4.5 mmol/L (ref 3.5–5.1)
Sodium: 136 mmol/L (ref 135–145)
Total Bilirubin: 0.9 mg/dL (ref 0.3–1.2)
Total Protein: 7.6 g/dL (ref 6.5–8.1)

## 2018-12-12 LAB — DIGOXIN LEVEL: Digoxin Level: 0.4 ng/mL — ABNORMAL LOW (ref 0.8–2.0)

## 2018-12-12 LAB — TSH: TSH: 3.158 u[IU]/mL (ref 0.350–4.500)

## 2018-12-12 LAB — MAGNESIUM: Magnesium: 1.7 mg/dL (ref 1.7–2.4)

## 2018-12-12 LAB — BRAIN NATRIURETIC PEPTIDE: B Natriuretic Peptide: 177.8 pg/mL — ABNORMAL HIGH (ref 0.0–100.0)

## 2018-12-12 NOTE — Progress Notes (Signed)
ReDS Vest - 12/12/18 1000      ReDS Vest   Fitting Posture  Sitting    Height Marker  --   station D   Ruler Value  38    [place]  Aligned    ReDS Value  38

## 2018-12-12 NOTE — Progress Notes (Signed)
Advanced Heart Failure Clinic Note   Referring Physician: PCP: [facility] PCP-Cardiologist: Dr Haroldine Laws  HPI: Anna Rowe is a 43 y.o. female with a history of hyperlipidemia, DM, COPD, sleep apnea noncompliant with CPAP, chronic DVT (diagnosed 2018) on Xarelto, tobacco use, right knee replacement 2015, and COPD.She has had RTKR and R/L hip replacements.    Of note, she states that her dad had HF and received an LVAD, after a similar "viral cardiomyopathy" type picture  Admitted to [facility] 08/08/18 - 08/15/2018 with worsening LE edema. Echo showed newly reduced EF 15-20%. [facility] 12/27 showed marked volume overload and low output and minimal nonobstructive CAD. Diuresed on milrinone, and weaned off as tolerated as medications titrated.   Today she presents for an acute visit due to increased fatigue. Overall feeling just ok. Complaining of fatigue oorly. Complaining of fatigue over the last few days. Denies PND/Orthopnea. Using CPAP 4 hours a night. No bleeding issues. Appetite ok. Last night she had fried chicken, macaroni, and mashed potatoes. Says she has been eating whatever her family brings her. No fever or chills.  Weight at home 290-299 pounds. Taking all medications. Followed by HF Paramedicine.   Echo 08/08/18: EF 15-20%, severe diffuse HK and septal dyskinesis, grade 2 DD, mod MR, LA moderately dilated, RV moderately dilated with moderately reduced function, PA peak pressure 35 mm Hg  Cath 12/27 Findings: Ao = 99/75 (81) LV = 93/31 RA = 22 RV = 54/25 PA = 58/24 (41) PCW = 30 Fick cardiac output/index = 4.1/1.6 SVR = 1163 PVR = 2.6 FA sat = 89% PA sat = 42%, 44% Assessment: 1. 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 Severe NICM with LVEF ~ 15% 3. Marked volume overload with low output  Past Medical History:  Diagnosis Date  . Arthritis   . CHF (congestive heart failure) ([place])   . COPD (chronic obstructive pulmonary disease) ([place])   .  Diabetes mellitus ([place])    type 2   . DVT (deep venous thrombosis) ([place]) dx 10-2016   RLE   . HLD (hyperlipidemia)    on crestor  . Pneumonia    09/2016  . Sleep apnea    per patient ;been dx does not use CPAP does not tolerate   Current Outpatient Medications  Medication Sig Dispense Refill  . amiodarone (PACERONE) 200 MG tablet Take 1 tablet (200 mg total) by mouth 2 (two) times daily. 180 tablet 3  . amitriptyline (ELAVIL) 50 MG tablet Take 1 tablet (50 mg total) by mouth at bedtime. 30 tablet 2  . carvedilol (COREG) 6.25 MG tablet Take 1 tablet (6.25 mg total) by mouth 2 (two) times daily with a meal. 60 tablet 6  . cholecalciferol (VITAMIN D3) 25 MCG (1000 UT) tablet Take 3,000 Units by mouth daily.    . cyclobenzaprine (FLEXERIL) 10 MG tablet Take 10 mg by mouth 3 (three) times daily.  1  . digoxin (LANOXIN) 0.125 MG tablet Take 1 tablet (0.125 mg total) by mouth daily. 30 tablet 5  . docusate sodium (COLACE) 100 MG capsule Take 100 mg by mouth 2 (two) times daily.   6  . gabapentin (NEURONTIN) 600 MG tablet Take 600 mg by mouth 4 (four) times daily.   3  . lubiprostone (AMITIZA) 24 MCG capsule Take 24 mcg by mouth 2 (two) times daily with a meal.    . Magnesium 250 MG TABS Take 1 tablet by mouth at bedtime.    . Meloxicam 15 MG TBDP  Take 15 mg by mouth daily.    . metFORMIN (GLUCOPHAGE-XR) 500 MG 24 hr tablet Take 500 mg 2 (two) times daily by mouth.   3  . mometasone-formoterol (DULERA) 200-5 MCG/ACT AERO Inhale 2 puffs into the lungs 2 (two) times daily. 2 Inhaler 0  . Multiple Vitamin (MULTIVITAMIN WITH MINERALS) TABS tablet Take 1 tablet by mouth daily.    Marland Kitchen oxyCODONE-acetaminophen (PERCOCET/ROXICET) 5-325 MG tablet Take 1 tablet by mouth 4 (four) times daily.    . OXYCONTIN 10 MG 12 hr tablet Take 10 mg by mouth every 12 (twelve) hours.  0  . potassium chloride SA (K-DUR) 20 MEQ tablet Take 20 mEq by mouth daily.    . rosuvastatin (CRESTOR) 10 MG tablet Take 1 tablet (10 mg  total) by mouth at bedtime. 30 tablet 5  . sacubitril-valsartan (ENTRESTO) 49-51 MG Take 1 tablet by mouth 2 (two) times daily. 60 tablet 3  . spironolactone (ALDACTONE) 25 MG tablet Take 0.5 tablets (12.5 mg total) by mouth at bedtime. 30 tablet 5  . torsemide (DEMADEX) 20 MG tablet Take 2 tablets (40 mg total) by mouth daily. 60 tablet 6  . XARELTO 20 MG TABS tablet Take 1 tablet (20 mg total) by mouth daily. 30 tablet 0  . ipratropium-albuterol (DUONEB) 0.5-2.5 (3) MG/3ML SOLN Take 3 mLs by nebulization 2 (two) times daily. (Patient not taking: Reported on 12/12/2018) 360 mL 0   No current facility-administered medications for this encounter.    Allergies  Allergen Reactions  . Methocarbamol Anaphylaxis and Swelling  . Tramadol Anaphylaxis    Tolerates Dilaudid, Oxycontin 04/04/16  . Bee Venom Hives   Social History   Socioeconomic History  . Marital status: Married    Spouse name: Not on file  . Number of children: Not on file  . Years of education: Not on file  . Highest education level: Not on file  Occupational History  . Not on file  Social Needs  . Financial resource strain: Not on file  . Food insecurity:    Worry: Not on file    Inability: Not on file  . Transportation needs:    Medical: Not on file    Non-medical: Not on file  Tobacco Use  . Smoking status: Former Smoker    Packs/day: 0.50    Years: 17.00    Pack years: 8.50    Types: Cigarettes    Last attempt to quit: 08/07/2018    Years since quitting: 0.3  . Smokeless tobacco: Never Used  . Tobacco comment: 3 Loose cigarettes in last week  Substance and Sexual Activity  . Alcohol use: No    Comment: seldom   . Drug use: No  . Sexual activity: Not Currently  Lifestyle  . Physical activity:    Days per week: Not on file    Minutes per session: Not on file  . Stress: Not on file  Relationships  . Social connections:    Talks on phone: Not on file    Gets together: Not on file    Attends religious  service: Not on file    Active member of club or organization: Not on file    Attends meetings of clubs or organizations: Not on file    Relationship status: Not on file  . Intimate partner violence:    Fear of current or ex partner: Not on file    Emotionally abused: Not on file    Physically abused: Not on file  Forced sexual activity: Not on file  Other Topics Concern  . Not on file  Social History Narrative  . Not on file   Family History  Problem Relation Age of Onset  . Hypertension Mother   . Hypertension Father   . Hypertension Sister    Vitals:   12/12/18 1028  BP: 116/78  Pulse: (!) 53  SpO2: 96%  Weight: 134 kg (295 lb 6 oz)    Wt Readings from Last 3 Encounters:  12/12/18 134 kg (295 lb 6 oz)  11/28/18 132.2 kg (291 lb 6.4 oz)  11/21/18 131.5 kg (290 lb)   REDS Clip 38%  PHYSICAL EXAM: General:  No resp difficulty HEENT: normal Neck: supple. JVP hard to assess due to body habitus. Carotids 2+ bilat; no bruits. No lymphadenopathy or thryomegaly appreciated. Cor: PMI nondisplaced. Regular rate & rhythm. No rubs, gallops or murmurs. Lungs: clear Abdomen: obese, soft, nontender, nondistended. No hepatosplenomegaly. No bruits or masses. Good bowel sounds. Extremities: warm, no cyanosis, clubbing, rash, edema. RLE knee brace Neuro: alert & orientedx3, cranial nerves grossly intact. moves all 4 extremities w/o difficulty. Affect pleasant  EKG : SR with occasional PVCs 93 bpm     ASSESSMENT & PLAN:  1. Chronic systolic HF due to NICM.Unclear etiology. ?viral illness in October vs CPAP. Also noncompliant with CPAP. A1C only 7.2. HIV negative. Has HTN.   - Echo 12/25: EF 15-20%, severe diffuse HK and septal dyskinesis, grade 2 DD, mod MR, LA moderately dilated, RV moderately dilated with moderately reduced function, PA peak pressure 35 mm Hg.  - R/LHC 12/27 showed marked volume overload with low output, severe NICM with EF 15%, and minimal non-obstructive CAD.  She has had a hysterectomy .  No MRI with hip replacements.  NYHA IIIb. Reds Clip 38%. She has mild volume overload today. Increase torsemide to 60 mg daily for 2 days then go back to 40 mg daily.   - Continue digoxin 0.125 daily, dig level 0.4 today.  - Continue Coreg 6.25 twice a day.  - Continue  enetresto 49-53m  twice a day.    - Continue spiro 12.5 mg daily. - Discussed low salt food choices.  -  Dr. JBroadus Johnfor genetic screening with strong family history of NICM cardiomyopathy. (Her father, and his mother) 2. Minimal nonobstructive CAD:  - LHC 12/27: 30% Prox LAD - No s/s ischemia    - Continue crestor.  - No ASA with Xarelto use 3. DVT - Continue Xarelto 20 mg daily.  4. OSA:  Continue CPAP nightly.BMount Rainierworking on getting Bipap  5. COPD:  - Continue chantix.  6. NSVT/PVCs  Zio Patch placed--> Discussed with Dr BHaroldine Lawshaving 10% PVC burden. Having at least 2 different morphologies.  Started amio 200 mg twice a day on 12/05/2018. Continue amio 200 mg twice a day. Can cut back in a couple of weeks.  Will also refer to EP for possible PVC ablation I dont want her amio long term if we can help it.  Check TSH, CMET, mag today. 7. Fatigue Check TSH, CBC today. Suspect multifactorial given OSA and HF.   Follow up in 2-3 weeks with an ECHO. If EF remains low will need to refer for ICD.  Plan to continue HF Paramedicine. I will call [facility] HF Paramedic to update on plan.   ADarrick Grinder NP 12/12/18

## 2018-12-12 NOTE — Patient Instructions (Signed)
Increase Torsemide to 60 mg (3 tabs) daily for 2 days ONLY, then back to 40 mg (2 tabs) daily  Your physician has requested that you have an echocardiogram. Echocardiography is a painless test that uses sound waves to create images of your heart. It provides your doctor with information about the size and shape of your heart and how well your heart's chambers and valves are working. This procedure takes approximately one hour. There are no restrictions for this procedure.  Your physician recommends that you schedule a follow-up appointment in: 4 weeks with telehealth visit with Dr Haroldine Laws

## 2018-12-18 ENCOUNTER — Telehealth (HOSPITAL_COMMUNITY): Payer: BC Managed Care – PPO | Admitting: Internal Medicine

## 2018-12-19 ENCOUNTER — Other Ambulatory Visit (HOSPITAL_COMMUNITY): Payer: Self-pay

## 2018-12-19 ENCOUNTER — Telehealth (HOSPITAL_COMMUNITY): Payer: Self-pay

## 2018-12-19 NOTE — Telephone Encounter (Signed)
I called Anna Rowe to schedule an appointment. She stated she would be available to meet with me this morning so we agreed to meet at 10:15.

## 2018-12-19 NOTE — Progress Notes (Signed)
Paramedicine Encounter    Patient ID: Anna Rowe, female    DOB: 1976-06-12, 43 y.o.   MRN: [id number]   Patient Care Team: [facility] [place] as PCP - General  Patient Active Problem List   Diagnosis Date Noted  . Chronic systolic (congestive) heart failure ([place]) 11/08/2018  . CHF (congestive heart failure) ([place]) 08/09/2018  . Acute diastolic CHF (congestive heart failure) ([place]) 08/08/2018  . DM2 (diabetes mellitus, type 2) ([place]) 08/08/2018  . History of DVT (deep vein thrombosis) 08/08/2018  . Unilateral primary osteoarthritis, left knee 08/24/2017  . History of total right knee replacement 08/24/2017  . Chronic bilateral low back pain 08/24/2017  . Status post total replacement of right hip 07/14/2017  . Status post total replacement of left hip 04/28/2017  . Unilateral primary osteoarthritis, left hip 03/09/2017  . Unilateral primary osteoarthritis, right hip 03/09/2017  . Primary osteoarthritis of right knee 04/04/2016    Current Outpatient Medications:  .  amiodarone (PACERONE) 200 MG tablet, Take 1 tablet (200 mg total) by mouth 2 (two) times daily., Disp: 180 tablet, Rfl: 3 .  amitriptyline (ELAVIL) 50 MG tablet, Take 1 tablet (50 mg total) by mouth at bedtime., Disp: 30 tablet, Rfl: 2 .  carvedilol (COREG) 6.25 MG tablet, Take 1 tablet (6.25 mg total) by mouth 2 (two) times daily with a meal., Disp: 60 tablet, Rfl: 6 .  cholecalciferol (VITAMIN D3) 25 MCG (1000 UT) tablet, Take 3,000 Units by mouth daily., Disp: , Rfl:  .  cyclobenzaprine (FLEXERIL) 10 MG tablet, Take 10 mg by mouth 3 (three) times daily., Disp: , Rfl: 1 .  digoxin (LANOXIN) 0.125 MG tablet, Take 1 tablet (0.125 mg total) by mouth daily., Disp: 30 tablet, Rfl: 5 .  docusate sodium (COLACE) 100 MG capsule, Take 100 mg by mouth 2 (two) times daily. , Disp: , Rfl: 6 .  gabapentin (NEURONTIN) 600 MG tablet, Take 600 mg by mouth 4 (four) times daily. , Disp: , Rfl: 3 .   ipratropium-albuterol (DUONEB) 0.5-2.5 (3) MG/3ML SOLN, Take 3 mLs by nebulization 2 (two) times daily. (Patient not taking: Reported on 12/12/2018), Disp: 360 mL, Rfl: 0 .  lubiprostone (AMITIZA) 24 MCG capsule, Take 24 mcg by mouth 2 (two) times daily with a meal., Disp: , Rfl:  .  Magnesium 250 MG TABS, Take 1 tablet by mouth at bedtime., Disp: , Rfl:  .  Meloxicam 15 MG TBDP, Take 15 mg by mouth daily., Disp: , Rfl:  .  metFORMIN (GLUCOPHAGE-XR) 500 MG 24 hr tablet, Take 500 mg 2 (two) times daily by mouth. , Disp: , Rfl: 3 .  mometasone-formoterol (DULERA) 200-5 MCG/ACT AERO, Inhale 2 puffs into the lungs 2 (two) times daily., Disp: 2 Inhaler, Rfl: 0 .  Multiple Vitamin (MULTIVITAMIN WITH MINERALS) TABS tablet, Take 1 tablet by mouth daily., Disp: , Rfl:  .  oxyCODONE-acetaminophen (PERCOCET/ROXICET) 5-325 MG tablet, Take 1 tablet by mouth 4 (four) times daily., Disp: , Rfl:  .  OXYCONTIN 10 MG 12 hr tablet, Take 10 mg by mouth every 12 (twelve) hours., Disp: , Rfl: 0 .  potassium chloride SA (K-DUR) 20 MEQ tablet, Take 20 mEq by mouth daily., Disp: , Rfl:  .  rosuvastatin (CRESTOR) 10 MG tablet, Take 1 tablet (10 mg total) by mouth at bedtime., Disp: 30 tablet, Rfl: 5 .  sacubitril-valsartan (ENTRESTO) 49-51 MG, Take 1 tablet by mouth 2 (two) times daily., Disp: 60 tablet, Rfl: 3 .  spironolactone (ALDACTONE) 25 MG tablet,  Take 0.5 tablets (12.5 mg total) by mouth at bedtime., Disp: 30 tablet, Rfl: 5 .  torsemide (DEMADEX) 20 MG tablet, Take 2 tablets (40 mg total) by mouth daily., Disp: 60 tablet, Rfl: 6 .  XARELTO 20 MG TABS tablet, Take 1 tablet (20 mg total) by mouth daily., Disp: 30 tablet, Rfl: 0 Allergies  Allergen Reactions  . Methocarbamol Anaphylaxis and Swelling  . Tramadol Anaphylaxis    Tolerates Dilaudid, Oxycontin 04/04/16  . Bee Venom Hives      Social History   Socioeconomic History  . Marital status: Married    Spouse name: Not on file  . Number of children: Not on  file  . Years of education: Not on file  . Highest education level: Not on file  Occupational History  . Not on file  Social Needs  . Financial resource strain: Not on file  . Food insecurity:    Worry: Not on file    Inability: Not on file  . Transportation needs:    Medical: Not on file    Non-medical: Not on file  Tobacco Use  . Smoking status: Former Smoker    Packs/day: 0.50    Years: 17.00    Pack years: 8.50    Types: Cigarettes    Last attempt to quit: 08/07/2018    Years since quitting: 0.3  . Smokeless tobacco: Never Used  . Tobacco comment: 3 Loose cigarettes in last week  Substance and Sexual Activity  . Alcohol use: No    Comment: seldom   . Drug use: No  . Sexual activity: Not Currently  Lifestyle  . Physical activity:    Days per week: Not on file    Minutes per session: Not on file  . Stress: Not on file  Relationships  . Social connections:    Talks on phone: Not on file    Gets together: Not on file    Attends religious service: Not on file    Active member of club or organization: Not on file    Attends meetings of clubs or organizations: Not on file    Relationship status: Not on file  . Intimate partner violence:    Fear of current or ex partner: Not on file    Emotionally abused: Not on file    Physically abused: Not on file    Forced sexual activity: Not on file  Other Topics Concern  . Not on file  Social History Narrative  . Not on file    Physical Exam Cardiovascular:     Rate and Rhythm: Normal rate and regular rhythm.     Pulses: Normal pulses.  Pulmonary:     Effort: Pulmonary effort is normal.     Breath sounds: Normal breath sounds.  Musculoskeletal:     Right lower leg: Edema present.     Left lower leg: Edema present.  Skin:    General: Skin is warm and dry.     Capillary Refill: Capillary refill takes less than 2 seconds.  Neurological:     Mental Status: She is alert and oriented to person, place, and time.   Psychiatric:        Mood and Affect: Mood normal.         Future Appointments  Date Time Provider [place]  12/26/2018 10:00 AM [facility] ECHO [facility]  01/09/2019 12:20 PM Bensimhon, Shaune Pascal, MD [facility] None    BP 116/78 (BP Location: Left Arm, Patient Position: Sitting, Cuff Size:  Normal)   Pulse 84   Resp 16   Wt 291 lb (132 kg)   SpO2 97%   BMI 39.47 kg/m   Weight yesterday- 293 lb Last visit weight- 295 lb  Ms Vancleve was seen at home today and reported feeling generally well. She denied chest pain, SOB, headache, dizziness, orthopnea, cough or fever since our last visit. She said she has been having intermittent "pounding in her chest" which was previously associated with her her frequent PVCs. She did say that these episodes have decreased since start of amiodarone. She was placed on a 4 lead ECG and she did show to have several PVCs, however they appeared far less frequent than previously witnessed. Her medications were verified and she continues to have her sister fill her pillbox. I will follow up next week.   Jacquiline Doe, EMT 12/19/18  ACTION: Home visit completed Next visit planned for 1 week

## 2018-12-26 ENCOUNTER — Telehealth (HOSPITAL_COMMUNITY): Payer: Self-pay

## 2018-12-26 ENCOUNTER — Other Ambulatory Visit: Payer: Self-pay

## 2018-12-26 ENCOUNTER — Other Ambulatory Visit (HOSPITAL_COMMUNITY): Payer: Self-pay

## 2018-12-26 ENCOUNTER — Ambulatory Visit (HOSPITAL_COMMUNITY)
Admission: RE | Admit: 2018-12-26 | Discharge: 2018-12-26 | Disposition: A | Discharge status: Home / Self Care | Payer: 59 | Source: Ambulatory Visit | Attending: Internal Medicine | Admitting: Internal Medicine

## 2018-12-26 DIAGNOSIS — J449 Chronic obstructive pulmonary disease, unspecified: Secondary | ICD-10-CM | POA: Diagnosis not present

## 2018-12-26 DIAGNOSIS — F17200 Nicotine dependence, unspecified, uncomplicated: Secondary | ICD-10-CM | POA: Insufficient documentation

## 2018-12-26 DIAGNOSIS — I5022 Chronic systolic (congestive) heart failure: Secondary | ICD-10-CM | POA: Diagnosis present

## 2018-12-26 DIAGNOSIS — I429 Cardiomyopathy, unspecified: Secondary | ICD-10-CM | POA: Insufficient documentation

## 2018-12-26 DIAGNOSIS — E785 Hyperlipidemia, unspecified: Secondary | ICD-10-CM | POA: Insufficient documentation

## 2018-12-26 DIAGNOSIS — E119 Type 2 diabetes mellitus without complications: Secondary | ICD-10-CM | POA: Diagnosis not present

## 2018-12-26 NOTE — Telephone Encounter (Signed)
I called Anna Rowe to schedule an appointment for today. She advised that she has an echocardiogram today at 10:00 but would be available after that. I advised I would be able to come around 13:00 and would hopefully have something to report about her test. She was understanding and agreeable.

## 2018-12-26 NOTE — Progress Notes (Signed)
Paramedicine Encounter    Patient ID: Anna Rowe, female    DOB: [birth year], 43 y.o.   MRN: [id number]   Patient Care Team: [facility] [place] as PCP - General  Patient Active Problem List   Diagnosis Date Noted  . Chronic systolic (congestive) heart failure ([place]) 11/08/2018  . CHF (congestive heart failure) ([place]) 08/09/2018  . Acute diastolic CHF (congestive heart failure) ([place]) 08/08/2018  . DM2 (diabetes mellitus, type 2) ([place]) 08/08/2018  . History of DVT (deep vein thrombosis) 08/08/2018  . Unilateral primary osteoarthritis, left knee 08/24/2017  . History of total right knee replacement 08/24/2017  . Chronic bilateral low back pain 08/24/2017  . Status post total replacement of right hip 07/14/2017  . Status post total replacement of left hip 04/28/2017  . Unilateral primary osteoarthritis, left hip 03/09/2017  . Unilateral primary osteoarthritis, right hip 03/09/2017  . Primary osteoarthritis of right knee 04/04/2016    Current Outpatient Medications:  .  amiodarone (PACERONE) 200 MG tablet, Take 1 tablet (200 mg total) by mouth 2 (two) times daily., Disp: 180 tablet, Rfl: 3 .  amitriptyline (ELAVIL) 50 MG tablet, Take 1 tablet (50 mg total) by mouth at bedtime., Disp: 30 tablet, Rfl: 2 .  carvedilol (COREG) 6.25 MG tablet, Take 1 tablet (6.25 mg total) by mouth 2 (two) times daily with a meal., Disp: 60 tablet, Rfl: 6 .  cholecalciferol (VITAMIN D3) 25 MCG (1000 UT) tablet, Take 3,000 Units by mouth daily., Disp: , Rfl:  .  cyclobenzaprine (FLEXERIL) 10 MG tablet, Take 10 mg by mouth 3 (three) times daily., Disp: , Rfl: 1 .  digoxin (LANOXIN) 0.125 MG tablet, Take 1 tablet (0.125 mg total) by mouth daily., Disp: 30 tablet, Rfl: 5 .  docusate sodium (COLACE) 100 MG capsule, Take 100 mg by mouth 2 (two) times daily. , Disp: , Rfl: 6 .  gabapentin (NEURONTIN) 600 MG tablet, Take 600 mg by mouth 4 (four) times daily. , Disp: , Rfl: 3 .   ipratropium-albuterol (DUONEB) 0.5-2.5 (3) MG/3ML SOLN, Take 3 mLs by nebulization 2 (two) times daily., Disp: 360 mL, Rfl: 0 .  lubiprostone (AMITIZA) 24 MCG capsule, Take 24 mcg by mouth 2 (two) times daily with a meal., Disp: , Rfl:  .  Magnesium 250 MG TABS, Take 1 tablet by mouth at bedtime., Disp: , Rfl:  .  Meloxicam 15 MG TBDP, Take 15 mg by mouth daily., Disp: , Rfl:  .  metFORMIN (GLUCOPHAGE-XR) 500 MG 24 hr tablet, Take 500 mg 2 (two) times daily by mouth. , Disp: , Rfl: 3 .  mometasone-formoterol (DULERA) 200-5 MCG/ACT AERO, Inhale 2 puffs into the lungs 2 (two) times daily., Disp: 2 Inhaler, Rfl: 0 .  Multiple Vitamin (MULTIVITAMIN WITH MINERALS) TABS tablet, Take 1 tablet by mouth daily., Disp: , Rfl:  .  oxyCODONE-acetaminophen (PERCOCET/ROXICET) 5-325 MG tablet, Take 1 tablet by mouth 4 (four) times daily., Disp: , Rfl:  .  OXYCONTIN 10 MG 12 hr tablet, Take 10 mg by mouth every 12 (twelve) hours., Disp: , Rfl: 0 .  potassium chloride SA (K-DUR) 20 MEQ tablet, Take 20 mEq by mouth daily., Disp: , Rfl:  .  rosuvastatin (CRESTOR) 10 MG tablet, Take 1 tablet (10 mg total) by mouth at bedtime., Disp: 30 tablet, Rfl: 5 .  sacubitril-valsartan (ENTRESTO) 49-51 MG, Take 1 tablet by mouth 2 (two) times daily., Disp: 60 tablet, Rfl: 3 .  spironolactone (ALDACTONE) 25 MG tablet, Take 0.5 tablets (12.5 mg total)  by mouth at bedtime., Disp: 30 tablet, Rfl: 5 .  torsemide (DEMADEX) 20 MG tablet, Take 2 tablets (40 mg total) by mouth daily., Disp: 60 tablet, Rfl: 6 .  XARELTO 20 MG TABS tablet, Take 1 tablet (20 mg total) by mouth daily., Disp: 30 tablet, Rfl: 0 Allergies  Allergen Reactions  . Methocarbamol Anaphylaxis and Swelling  . Tramadol Anaphylaxis    Tolerates Dilaudid, Oxycontin 04/04/16  . Bee Venom Hives      Social History   Socioeconomic History  . Marital status: Married    Spouse name: Not on file  . Number of children: Not on file  . Years of education: Not on file   . Highest education level: Not on file  Occupational History  . Not on file  Social Needs  . Financial resource strain: Not on file  . Food insecurity:    Worry: Not on file    Inability: Not on file  . Transportation needs:    Medical: Not on file    Non-medical: Not on file  Tobacco Use  . Smoking status: Former Smoker    Packs/day: 0.50    Years: 17.00    Pack years: 8.50    Types: Cigarettes    Last attempt to quit: 08/07/2018    Years since quitting: 0.3  . Smokeless tobacco: Never Used  . Tobacco comment: 3 Loose cigarettes in last week  Substance and Sexual Activity  . Alcohol use: No    Comment: seldom   . Drug use: No  . Sexual activity: Not Currently  Lifestyle  . Physical activity:    Days per week: Not on file    Minutes per session: Not on file  . Stress: Not on file  Relationships  . Social connections:    Talks on phone: Not on file    Gets together: Not on file    Attends religious service: Not on file    Active member of club or organization: Not on file    Attends meetings of clubs or organizations: Not on file    Relationship status: Not on file  . Intimate partner violence:    Fear of current or ex partner: Not on file    Emotionally abused: Not on file    Physically abused: Not on file    Forced sexual activity: Not on file  Other Topics Concern  . Not on file  Social History Narrative  . Not on file    Physical Exam Cardiovascular:     Rate and Rhythm: Normal rate and regular rhythm.     Pulses: Normal pulses.  Pulmonary:     Effort: Pulmonary effort is normal.     Breath sounds: Normal breath sounds.  Musculoskeletal: Normal range of motion.     Right lower leg: Edema present.     Left lower leg: Edema present.  Skin:    General: Skin is warm and dry.     Capillary Refill: Capillary refill takes less than 2 seconds.  Neurological:     Mental Status: She is alert and oriented to person, place, and time.  Psychiatric:         Mood and Affect: Mood normal.         Future Appointments  Date Time Provider [place]  01/09/2019 12:20 PM Bensimhon, Shaune Pascal, MD [facility] None    BP 132/79 (BP Location: Left Arm, Patient Position: Sitting, Cuff Size: Normal)   Pulse 73   Resp 16  Wt 291 lb (132 kg)   SpO2 99%   BMI 39.47 kg/m   Weight yesterday- 291.6 lb Last visit weight- 291 lb  Ms Runnion was seen at home today and reported feeling generally well. She denied chest pain, SOB, headache, orthopnea, cough or fever since our last visit. She continues to have brief episodes of dizziness when she gets up but said they resolve quickly. She stated she has been compliant with her medications and her weight has been stable. She had an echo done today at [facility] however no results were available when I saw her. She has an appointment for a virtual visit with Dr Haroldine Laws in two weeks so we will know more then. Her medications were verified and the only change was doxycycline being added for 10 days to treat a skin condition. I will follow up next week.   Jacquiline Doe, EMT 12/26/18  ACTION: Home visit completed Next visit planned for 1 week

## 2019-01-01 ENCOUNTER — Telehealth (HOSPITAL_COMMUNITY): Payer: Self-pay

## 2019-01-01 NOTE — Telephone Encounter (Signed)
I called Anna Rowe to schedule an appointment. She did not answer so I left a message advising that I would be in her area between 12:30 and 13:00 and asked that she call me back to confirm a time.

## 2019-01-02 ENCOUNTER — Telehealth (HOSPITAL_COMMUNITY): Payer: Self-pay | Admitting: *Deleted

## 2019-01-02 ENCOUNTER — Other Ambulatory Visit (HOSPITAL_COMMUNITY): Payer: Self-pay

## 2019-01-02 NOTE — Telephone Encounter (Signed)
Zack w/paramedicine called to let us know pt's PCP had started her on mag-ox, levothyroxine and tulicity, med list updated.

## 2019-01-02 NOTE — Progress Notes (Signed)
Paramedicine Encounter    Patient ID: Anna Rowe, female    DOB: Mar 21, 1976, 43 y.o.   MRN: [id number]   Patient Care Team: [facility] [place] as PCP - General  Patient Active Problem List   Diagnosis Date Noted  . Chronic systolic (congestive) heart failure ([place]) 11/08/2018  . CHF (congestive heart failure) ([place]) 08/09/2018  . Acute diastolic CHF (congestive heart failure) ([place]) 08/08/2018  . DM2 (diabetes mellitus, type 2) ([place]) 08/08/2018  . History of DVT (deep vein thrombosis) 08/08/2018  . Unilateral primary osteoarthritis, left knee 08/24/2017  . History of total right knee replacement 08/24/2017  . Chronic bilateral low back pain 08/24/2017  . Status post total replacement of right hip 07/14/2017  . Status post total replacement of left hip 04/28/2017  . Unilateral primary osteoarthritis, left hip 03/09/2017  . Unilateral primary osteoarthritis, right hip 03/09/2017  . Primary osteoarthritis of right knee 04/04/2016    Current Outpatient Medications:  .  amiodarone (PACERONE) 200 MG tablet, Take 1 tablet (200 mg total) by mouth 2 (two) times daily., Disp: 180 tablet, Rfl: 3 .  amitriptyline (ELAVIL) 50 MG tablet, Take 1 tablet (50 mg total) by mouth at bedtime., Disp: 30 tablet, Rfl: 2 .  carvedilol (COREG) 6.25 MG tablet, Take 1 tablet (6.25 mg total) by mouth 2 (two) times daily with a meal., Disp: 60 tablet, Rfl: 6 .  cholecalciferol (VITAMIN D3) 25 MCG (1000 UT) tablet, Take 3,000 Units by mouth daily., Disp: , Rfl:  .  cyclobenzaprine (FLEXERIL) 10 MG tablet, Take 10 mg by mouth 3 (three) times daily., Disp: , Rfl: 1 .  digoxin (LANOXIN) 0.125 MG tablet, Take 1 tablet (0.125 mg total) by mouth daily., Disp: 30 tablet, Rfl: 5 .  docusate sodium (COLACE) 100 MG capsule, Take 100 mg by mouth 2 (two) times daily. , Disp: , Rfl: 6 .  gabapentin (NEURONTIN) 600 MG tablet, Take 600 mg by mouth 4 (four) times daily. , Disp: , Rfl: 3 .   ipratropium-albuterol (DUONEB) 0.5-2.5 (3) MG/3ML SOLN, Take 3 mLs by nebulization 2 (two) times daily., Disp: 360 mL, Rfl: 0 .  lubiprostone (AMITIZA) 24 MCG capsule, Take 24 mcg by mouth 2 (two) times daily with a meal., Disp: , Rfl:  .  Meloxicam 15 MG TBDP, Take 15 mg by mouth daily., Disp: , Rfl:  .  metFORMIN (GLUCOPHAGE-XR) 500 MG 24 hr tablet, Take 500 mg 2 (two) times daily by mouth. , Disp: , Rfl: 3 .  mometasone-formoterol (DULERA) 200-5 MCG/ACT AERO, Inhale 2 puffs into the lungs 2 (two) times daily., Disp: 2 Inhaler, Rfl: 0 .  Multiple Vitamin (MULTIVITAMIN WITH MINERALS) TABS tablet, Take 1 tablet by mouth daily., Disp: , Rfl:  .  oxyCODONE-acetaminophen (PERCOCET/ROXICET) 5-325 MG tablet, Take 1 tablet by mouth 4 (four) times daily., Disp: , Rfl:  .  OXYCONTIN 10 MG 12 hr tablet, Take 10 mg by mouth every 12 (twelve) hours., Disp: , Rfl: 0 .  potassium chloride SA (K-DUR) 20 MEQ tablet, Take 20 mEq by mouth daily., Disp: , Rfl:  .  rosuvastatin (CRESTOR) 10 MG tablet, Take 1 tablet (10 mg total) by mouth at bedtime., Disp: 30 tablet, Rfl: 5 .  sacubitril-valsartan (ENTRESTO) 49-51 MG, Take 1 tablet by mouth 2 (two) times daily., Disp: 60 tablet, Rfl: 3 .  spironolactone (ALDACTONE) 25 MG tablet, Take 0.5 tablets (12.5 mg total) by mouth at bedtime., Disp: 30 tablet, Rfl: 5 .  torsemide (DEMADEX) 20 MG tablet, Take  2 tablets (40 mg total) by mouth daily., Disp: 60 tablet, Rfl: 6 .  XARELTO 20 MG TABS tablet, Take 1 tablet (20 mg total) by mouth daily., Disp: 30 tablet, Rfl: 0 .  Dulaglutide (TRULICITY) 2.58 NI/7.7OE SOPN, Inject 0.75 mg into the skin once a week., Disp: , Rfl:  .  levothyroxine (SYNTHROID) 25 MCG tablet, Take 25 mcg by mouth daily before breakfast., Disp: , Rfl:  .  magnesium oxide (MAG-OX) 400 MG tablet, Take 400 mg by mouth daily., Disp: , Rfl:  Allergies  Allergen Reactions  . Methocarbamol Anaphylaxis and Swelling  . Tramadol Anaphylaxis    Tolerates Dilaudid,  Oxycontin 04/04/16  . Bee Venom Hives      Social History   Socioeconomic History  . Marital status: Married    Spouse name: Not on file  . Number of children: Not on file  . Years of education: Not on file  . Highest education level: Not on file  Occupational History  . Not on file  Social Needs  . Financial resource strain: Not on file  . Food insecurity:    Worry: Not on file    Inability: Not on file  . Transportation needs:    Medical: Not on file    Non-medical: Not on file  Tobacco Use  . Smoking status: Former Smoker    Packs/day: 0.50    Years: 17.00    Pack years: 8.50    Types: Cigarettes    Last attempt to quit: 08/07/2018    Years since quitting: 0.4  . Smokeless tobacco: Never Used  . Tobacco comment: 3 Loose cigarettes in last week  Substance and Sexual Activity  . Alcohol use: No    Comment: seldom   . Drug use: No  . Sexual activity: Not Currently  Lifestyle  . Physical activity:    Days per week: Not on file    Minutes per session: Not on file  . Stress: Not on file  Relationships  . Social connections:    Talks on phone: Not on file    Gets together: Not on file    Attends religious service: Not on file    Active member of club or organization: Not on file    Attends meetings of clubs or organizations: Not on file    Relationship status: Not on file  . Intimate partner violence:    Fear of current or ex partner: Not on file    Emotionally abused: Not on file    Physically abused: Not on file    Forced sexual activity: Not on file  Other Topics Concern  . Not on file  Social History Narrative  . Not on file    Physical Exam Cardiovascular:     Rate and Rhythm: Normal rate and regular rhythm.     Pulses: Normal pulses.     Heart sounds: Normal heart sounds.  Pulmonary:     Effort: Pulmonary effort is normal.     Breath sounds: Normal breath sounds.  Musculoskeletal: Normal range of motion.     Right lower leg: No edema.     Left  lower leg: No edema.  Skin:    General: Skin is warm and dry.     Capillary Refill: Capillary refill takes less than 2 seconds.  Neurological:     Mental Status: She is alert and oriented to person, place, and time.  Psychiatric:        Mood and Affect: Mood normal.  Future Appointments  Date Time Provider [place]  01/09/2019 12:20 PM Bensimhon, Shaune Pascal, MD [facility] None    BP 140/83 (BP Location: Left Arm, Patient Position: Sitting, Cuff Size: Normal)   Pulse 90   Resp 18   Wt 280 lb 9.6 oz (127.3 kg)   SpO2 98%   BMI 38.06 kg/m   Weight yesterday- 282.6 lb Last visit weight- 291 lb  Anna Rowe was seen at home today and reported feeling well. She denied chest pain, SOB, headache, dizziness, orthopnea, cough or fever since our last visit. She stated she has been compliant with her medications over the past week and her weight has been steadily decreasing. Her medications were verified and the clinic was notified of new medications listed below. I will follow up next week for her visit with Dr Haroldine Laws.  Changed to Mag ox 400 mg Add levothyroxine 25 mcg Add Trulicity 5.36 mg weekly  Anna Rowe, EMT 01/02/19  ACTION: Home visit completed Next visit planned for 1 week

## 2019-01-09 ENCOUNTER — Ambulatory Visit (HOSPITAL_COMMUNITY)
Admission: RE | Admit: 2019-01-09 | Discharge: 2019-01-09 | Disposition: A | Discharge status: Home / Self Care | Payer: 59 | Source: Ambulatory Visit | Attending: Internal Medicine | Admitting: Internal Medicine

## 2019-01-09 ENCOUNTER — Other Ambulatory Visit: Payer: Self-pay

## 2019-01-09 ENCOUNTER — Telehealth (HOSPITAL_COMMUNITY): Payer: Self-pay

## 2019-01-09 DIAGNOSIS — I5022 Chronic systolic (congestive) heart failure: Secondary | ICD-10-CM

## 2019-01-09 DIAGNOSIS — I2510 Atherosclerotic heart disease of native coronary artery without angina pectoris: Secondary | ICD-10-CM

## 2019-01-09 DIAGNOSIS — I493 Ventricular premature depolarization: Secondary | ICD-10-CM

## 2019-01-09 DIAGNOSIS — Z86718 Personal history of other venous thrombosis and embolism: Secondary | ICD-10-CM

## 2019-01-09 MED ORDER — AMIODARONE HCL 200 MG PO TABS: 200.0000 mg | ORAL_TABLET | Freq: Every day | ORAL | 3 refills | Status: DC

## 2019-01-09 MED ORDER — SACUBITRIL-VALSARTAN 97-103 MG PO TABS: 1.0000 | ORAL_TABLET | Freq: Two times a day (BID) | ORAL | 3 refills | Status: DC

## 2019-01-09 NOTE — Progress Notes (Signed)
Advanced Heart Failure Clinic Note   Referring Physician: PCP: [facility] PCP-Cardiologist: Dr Haroldine Laws  HPI: Anna Rowe is a 43 y.o. female with a history of hyperlipidemia, DM, COPD, sleep apnea noncompliant with CPAP, chronic DVT (diagnosed 2018) on Xarelto, tobacco use, right knee replacement 2015, and COPD.She has had RTKR and R/L hip replacements.    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 Echo showed newly reduced EF 15-20%. [facility] 12/27 showed marked volume overload and low output and minimal nonobstructive CAD. Diuresed on milrinone, and weaned off as tolerated as medications titrated.    Had a telehealth visit several weeks ago for increased fatigue. NYHA IIIb. Reds Clip 38%. Increased torsemide to 60 mg daily for 2 days then go back to 40 mg daily.  Had echo 12/26/18 EF 25-30%   Today she presents for telehealth visit. Says she is feeling better. Still with Paramedicine with Thedore Mins every Wednesday. Taking torsemide 40 daily. Not needing extra. Weight down to 277. Occasional dizzy when she stands. No orthopnea, PND or edema. Wearing CPAP. BP 129/79   Echo 08/08/18: EF 15-20%, severe diffuse HK and septal dyskinesis, grade 2 DD, mod MR, LA moderately dilated, RV moderately dilated with moderately reduced function, PA peak pressure 35 mm Hg  Cath 12/27 Findings: Ao = 99/75 (81) LV = 93/31 RA = 22 RV = 54/25 PA = 58/24 (41) PCW = 30 Fick cardiac output/index = 4.1/1.6 SVR = 1163 PVR = 2.6 FA sat = 89% PA sat = 42%, 44% Assessment: 1. 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 Severe NICM with LVEF ~ 15% 3. Marked volume overload with low output  Past Medical History:  Diagnosis Date  . Arthritis   . CHF (congestive heart failure) ([place])   . COPD (chronic obstructive pulmonary disease) ([place])   . Diabetes  mellitus ([place])    type 2   . DVT (deep venous thrombosis) ([place]) dx 10-2016   RLE   . HLD (hyperlipidemia)    on crestor  . Pneumonia    09/2016  . Sleep apnea    per patient ;been dx does not use CPAP does not tolerate   Current Outpatient Medications  Medication Sig Dispense Refill  . amiodarone (PACERONE) 200 MG tablet Take 1 tablet (200 mg total) by mouth 2 (two) times daily. 180 tablet 3  . amitriptyline (ELAVIL) 50 MG tablet Take 1 tablet (50 mg total) by mouth at bedtime. 30 tablet 2  . carvedilol (COREG) 6.25 MG tablet Take 1 tablet (6.25 mg total) by mouth 2 (two) times daily with a meal. 60 tablet 6  . cholecalciferol (VITAMIN D3) 25 MCG (1000 UT) tablet Take 3,000 Units by mouth daily.    . cyclobenzaprine (FLEXERIL) 10 MG tablet Take 10 mg by mouth 3 (three) times daily.  1  . digoxin (LANOXIN) 0.125 MG tablet Take 1 tablet (0.125 mg total) by mouth daily. 30 tablet 5  . docusate sodium (COLACE) 100 MG capsule Take 100 mg by mouth 2 (two) times daily.   6  . Dulaglutide (TRULICITY) 8.58 IF/0.2DX SOPN Inject 0.75 mg into the skin once a week.    . gabapentin (NEURONTIN) 600 MG tablet Take 600 mg by mouth 4 (four) times daily.   3  . ipratropium-albuterol (DUONEB) 0.5-2.5 (3) MG/3ML SOLN Take 3 mLs by nebulization 2 (two) times daily. [place]  mL 0  . levothyroxine (SYNTHROID) 25 MCG tablet Take 25 mcg by mouth daily before breakfast.    . lubiprostone (AMITIZA) 24 MCG capsule Take 24 mcg by mouth 2 (two) times daily with a meal.    . magnesium oxide (MAG-OX) 400 MG tablet Take 400 mg by mouth daily.    . Meloxicam 15 MG TBDP Take 15 mg by mouth daily.    . metFORMIN (GLUCOPHAGE-XR) 500 MG 24 hr tablet Take 500 mg 2 (two) times daily by mouth.   3  . mometasone-formoterol (DULERA) 200-5 MCG/ACT AERO Inhale 2 puffs into the lungs 2 (two) times daily. 2 Inhaler 0  . Multiple Vitamin (MULTIVITAMIN WITH MINERALS) TABS tablet Take 1 tablet by mouth daily.    Marland Kitchen oxyCODONE-acetaminophen  (PERCOCET/ROXICET) 5-325 MG tablet Take 1 tablet by mouth 4 (four) times daily.    . OXYCONTIN 10 MG 12 hr tablet Take 10 mg by mouth every 12 (twelve) hours.  0  . potassium chloride SA (K-DUR) 20 MEQ tablet Take 20 mEq by mouth daily.    . rosuvastatin (CRESTOR) 10 MG tablet Take 1 tablet (10 mg total) by mouth at bedtime. 30 tablet 5  . sacubitril-valsartan (ENTRESTO) 49-51 MG Take 1 tablet by mouth 2 (two) times daily. 60 tablet 3  . spironolactone (ALDACTONE) 25 MG tablet Take 0.5 tablets (12.5 mg total) by mouth at bedtime. 30 tablet 5  . torsemide (DEMADEX) 20 MG tablet Take 2 tablets (40 mg total) by mouth daily. 60 tablet 6  . XARELTO 20 MG TABS tablet Take 1 tablet (20 mg total) by mouth daily. 30 tablet 0   No current facility-administered medications for this encounter.    Allergies  Allergen Reactions  . Methocarbamol Anaphylaxis and Swelling  . Tramadol Anaphylaxis    Tolerates Dilaudid, Oxycontin 04/04/16  . Bee Venom Hives   Social History   Socioeconomic History  . Marital status: Married    Spouse name: Not on file  . Number of children: Not on file  . Years of education: Not on file  . Highest education level: Not on file  Occupational History  . Not on file  Social Needs  . Financial resource strain: Not on file  . Food insecurity:    Worry: Not on file    Inability: Not on file  . Transportation needs:    Medical: Not on file    Non-medical: Not on file  Tobacco Use  . Smoking status: Former Smoker    Packs/day: 0.50    Years: 17.00    Pack years: 8.50    Types: Cigarettes    Last attempt to quit: 08/07/2018    Years since quitting: 0.4  . Smokeless tobacco: Never Used  . Tobacco comment: 3 Loose cigarettes in last week  Substance and Sexual Activity  . Alcohol use: No    Comment: seldom   . Drug use: No  . Sexual activity: Not Currently  Lifestyle  . Physical activity:    Days per week: Not on file    Minutes per session: Not on file  .  Stress: Not on file  Relationships  . Social connections:    Talks on phone: Not on file    Gets together: Not on file    Attends religious service: Not on file    Active member of club or organization: Not on file    Attends meetings of clubs or organizations: Not on file    Relationship status: Not on file  .  Intimate partner violence:    Fear of current or ex partner: Not on file    Emotionally abused: Not on file    Physically abused: Not on file    Forced sexual activity: Not on file  Other Topics Concern  . Not on file  Social History Narrative  . Not on file   Family History  Problem Relation Age of Onset  . Hypertension Mother   . Hypertension Father   . Hypertension Sister      Wt Readings from Last 3 Encounters:  01/02/19 127.3 kg (280 lb 9.6 oz)  12/26/18 132 kg (291 lb)  12/19/18 132 kg (291 lb)   Exam:  (Video/Tele Health Call; Exam is subjective and or/visual.) General:  Speaks in full sentences. No resp difficulty. Lungs: Normal respiratory effort with conversation.  Abdomen: Non-distended per patient report Extremities: Pt denies edema. Neuro: Alert & oriented x 3.    ASSESSMENT & PLAN:  1. Chronic systolic HF due to NICM.Unclear etiology. Suspect possible PVC related vs viral or OSA  - Echo 12/25: EF 15-20%, severe diffuse HK and septal dyskinesis, grade 2 DD, mod MR, LA moderately dilated, RV moderately dilated with moderately reduced function, PA peak pressure 35 mm Hg.  - Ech 5/20 EF 25-30% (improving with PVC suppression) - hold off on ICD eval for now - [facility] 12/27 showed marked volume overload with low output, severe NICM with EF 15%, and minimal non-obstructive CAD. She has had a hysterectomy .  - No MRI with hip replacements and size - NYHA II. Volume status appears stable.    - Continue digoxin 0.125 daily, dig level ok  - Continue Coreg 6.25 twice a day.  - Increase enetresto to 97/103 mg  twice a day.    - Continue spiro 12.5 mg daily.  - Discussed low salt food choices.  -  Dr. Broadus John for genetic screening with strong family history of NICM cardiomyopathy. (Her father, and his mother)  2. Minimal nonobstructive CAD:  - LHC 12/27: 30% Prox LAD - No s/s ischemia - Continue crestor.  - No ASA with Xarelto use  3. DVT - Continue Xarelto 20 mg daily. No bleeding.   4. OSA:  Continue CPAP nightly.[place] working on getting Bipap   5. COPD:  - Continue chantix.   6. NSVT/PVCs  - Zio Patch placed having 10% PVC burden. Having at least 2 different morphologies.  - Started amio 200 mg twice a day on 12/05/2018.  - Has total PVC suppression on last visit with Paramedicine - Drop amio to 200 daily  COVID screen. The patient does not have any symptoms that suggest any further testing/ screening at this time.  Social distancing reinforced today.  Recommended follow-up:  1 month telehealth.   Relevant cardiac medications were reviewed at length with the patient today.   The patient does not have concerns regarding their medications at this time.   The following changes were made today:  As above  Today, I have spent 16 minutes with the patient with telehealth technology discussing the above issues .    Glori Bickers, MD 01/09/19

## 2019-01-09 NOTE — Telephone Encounter (Signed)
I received a phone call from Anna Rowe advising that she had just finished her virtual visit with the HF clinic. She stated her amiodarone dose was decreased to once daily and and her Delene Loll was increased to 97/103 mg BID. She stated she did not need a visit this week and would see me next week. I will call then to schedule an appointment.

## 2019-01-09 NOTE — Addendum Note (Signed)
Encounter addended by: Marlise Eves, RN on: 01/09/2019 12:16 PM  Actions taken: Order list changed, Clinical Note Signed

## 2019-01-09 NOTE — Progress Notes (Signed)
Spoke to the patient to review after visit summary. Pt verbalized instructions back to RN. No further questions. No further needs at this time.

## 2019-01-09 NOTE — Patient Instructions (Addendum)
INCREASE Entresto to 97/103 (1 tab) twice daily.  DECREASE Amiodarone to 27m (1 tab) daily  Please follow up with Dr. BHaroldine Lawsin 1 month with a telehealth visit. This is scheduled for June 26th at 3:20pm.

## 2019-01-15 ENCOUNTER — Telehealth (HOSPITAL_COMMUNITY): Payer: Self-pay

## 2019-01-15 NOTE — Telephone Encounter (Signed)
I called Anna Rowe to schedule an appointment. She stated she would be available tomorrow morning and agreed to meet me at 10:30 or 11:00.

## 2019-01-16 ENCOUNTER — Other Ambulatory Visit (HOSPITAL_COMMUNITY): Payer: Self-pay

## 2019-01-16 NOTE — Progress Notes (Signed)
Paramedicine Encounter    Patient ID: Nira Conn, female    DOB: 1976-02-06, 43 y.o.   MRN: [id number]   Patient Care Team: [facility] [place] as PCP - General  Patient Active Problem List   Diagnosis Date Noted  . Chronic systolic (congestive) heart failure ([place]) 11/08/2018  . CHF (congestive heart failure) ([place]) 08/09/2018  . Acute diastolic CHF (congestive heart failure) ([place]) 08/08/2018  . DM2 (diabetes mellitus, type 2) ([place]) 08/08/2018  . History of DVT (deep vein thrombosis) 08/08/2018  . Unilateral primary osteoarthritis, left knee 08/24/2017  . History of total right knee replacement 08/24/2017  . Chronic bilateral low back pain 08/24/2017  . Status post total replacement of right hip 07/14/2017  . Status post total replacement of left hip 04/28/2017  . Unilateral primary osteoarthritis, left hip 03/09/2017  . Unilateral primary osteoarthritis, right hip 03/09/2017  . Primary osteoarthritis of right knee 04/04/2016    Current Outpatient Medications:  .  amiodarone (PACERONE) 200 MG tablet, Take 1 tablet (200 mg total) by mouth daily., Disp: 90 tablet, Rfl: 3 .  amitriptyline (ELAVIL) 50 MG tablet, Take 1 tablet (50 mg total) by mouth at bedtime., Disp: 30 tablet, Rfl: 2 .  carvedilol (COREG) 6.25 MG tablet, Take 1 tablet (6.25 mg total) by mouth 2 (two) times daily with a meal., Disp: 60 tablet, Rfl: 6 .  cholecalciferol (VITAMIN D3) 25 MCG (1000 UT) tablet, Take 3,000 Units by mouth daily., Disp: , Rfl:  .  cyclobenzaprine (FLEXERIL) 10 MG tablet, Take 10 mg by mouth 3 (three) times daily., Disp: , Rfl: 1 .  digoxin (LANOXIN) 0.125 MG tablet, Take 1 tablet (0.125 mg total) by mouth daily., Disp: 30 tablet, Rfl: 5 .  docusate sodium (COLACE) 100 MG capsule, Take 100 mg by mouth 2 (two) times daily. , Disp: , Rfl: 6 .  Dulaglutide (TRULICITY) 5.45 GY/5.6LS SOPN, Inject 0.75 mg into the skin once a week., Disp: , Rfl:  .  gabapentin (NEURONTIN) 600 MG  tablet, Take 600 mg by mouth 4 (four) times daily. , Disp: , Rfl: 3 .  ipratropium-albuterol (DUONEB) 0.5-2.5 (3) MG/3ML SOLN, Take 3 mLs by nebulization 2 (two) times daily., Disp: 360 mL, Rfl: 0 .  levothyroxine (SYNTHROID) 25 MCG tablet, Take 25 mcg by mouth daily before breakfast., Disp: , Rfl:  .  lubiprostone (AMITIZA) 24 MCG capsule, Take 24 mcg by mouth 2 (two) times daily with a meal., Disp: , Rfl:  .  magnesium oxide (MAG-OX) 400 MG tablet, Take 400 mg by mouth daily., Disp: , Rfl:  .  Meloxicam 15 MG TBDP, Take 15 mg by mouth daily., Disp: , Rfl:  .  metFORMIN (GLUCOPHAGE-XR) 500 MG 24 hr tablet, Take 500 mg 2 (two) times daily by mouth. , Disp: , Rfl: 3 .  mometasone-formoterol (DULERA) 200-5 MCG/ACT AERO, Inhale 2 puffs into the lungs 2 (two) times daily., Disp: 2 Inhaler, Rfl: 0 .  Multiple Vitamin (MULTIVITAMIN WITH MINERALS) TABS tablet, Take 1 tablet by mouth daily., Disp: , Rfl:  .  oxyCODONE-acetaminophen (PERCOCET/ROXICET) 5-325 MG tablet, Take 1 tablet by mouth 4 (four) times daily., Disp: , Rfl:  .  OXYCONTIN 10 MG 12 hr tablet, Take 10 mg by mouth every 12 (twelve) hours., Disp: , Rfl: 0 .  potassium chloride SA (K-DUR) 20 MEQ tablet, Take 20 mEq by mouth daily., Disp: , Rfl:  .  rosuvastatin (CRESTOR) 10 MG tablet, Take 1 tablet (10 mg total) by mouth at bedtime., Disp:  30 tablet, Rfl: 5 .  sacubitril-valsartan (ENTRESTO) 97-103 MG, Take 1 tablet by mouth 2 (two) times daily., Disp: 180 tablet, Rfl: 3 .  spironolactone (ALDACTONE) 25 MG tablet, Take 0.5 tablets (12.5 mg total) by mouth at bedtime., Disp: 30 tablet, Rfl: 5 .  torsemide (DEMADEX) 20 MG tablet, Take 2 tablets (40 mg total) by mouth daily., Disp: 60 tablet, Rfl: 6 .  XARELTO 20 MG TABS tablet, Take 1 tablet (20 mg total) by mouth daily., Disp: 30 tablet, Rfl: 0 Allergies  Allergen Reactions  . Methocarbamol Anaphylaxis and Swelling  . Tramadol Anaphylaxis    Tolerates Dilaudid, Oxycontin 04/04/16  . Bee Venom  Hives      Social History   Socioeconomic History  . Marital status: Married    Spouse name: Not on file  . Number of children: Not on file  . Years of education: Not on file  . Highest education level: Not on file  Occupational History  . Not on file  Social Needs  . Financial resource strain: Not on file  . Food insecurity:    Worry: Not on file    Inability: Not on file  . Transportation needs:    Medical: Not on file    Non-medical: Not on file  Tobacco Use  . Smoking status: Former Smoker    Packs/day: 0.50    Years: 17.00    Pack years: 8.50    Types: Cigarettes    Last attempt to quit: 08/07/2018    Years since quitting: 0.4  . Smokeless tobacco: Never Used  . Tobacco comment: 3 Loose cigarettes in last week  Substance and Sexual Activity  . Alcohol use: No    Comment: seldom   . Drug use: No  . Sexual activity: Not Currently  Lifestyle  . Physical activity:    Days per week: Not on file    Minutes per session: Not on file  . Stress: Not on file  Relationships  . Social connections:    Talks on phone: Not on file    Gets together: Not on file    Attends religious service: Not on file    Active member of club or organization: Not on file    Attends meetings of clubs or organizations: Not on file    Relationship status: Not on file  . Intimate partner violence:    Fear of current or ex partner: Not on file    Emotionally abused: Not on file    Physically abused: Not on file    Forced sexual activity: Not on file  Other Topics Concern  . Not on file  Social History Narrative  . Not on file    Physical Exam Cardiovascular:     Rate and Rhythm: Normal rate and regular rhythm.     Pulses: Normal pulses.  Pulmonary:     Effort: Pulmonary effort is normal.     Breath sounds: Normal breath sounds.  Musculoskeletal: Normal range of motion.     Right lower leg: No edema.     Left lower leg: No edema.  Skin:    General: Skin is warm and dry.      Capillary Refill: Capillary refill takes less than 2 seconds.  Neurological:     Mental Status: She is alert and oriented to person, place, and time.  Psychiatric:        Mood and Affect: Mood normal.         Future Appointments  Date Time Provider  [place]  02/08/2019  3:20 PM Bensimhon, Shaune Pascal, MD [facility] None    BP 96/63 (BP Location: Left Arm, Patient Position: Sitting, Cuff Size: Normal)   Pulse 88   Resp 16   Wt 279 lb 9.6 oz (126.8 kg)   SpO2 98%   BMI 37.92 kg/m   Weight yesterday- 280.9 lb Last visit weight- 280.6 lb  Ms Fowle was seen at home today and reported feeling generally well. She denied chest pain, SOB, headache, dizziness, orthopnea, cough or fever since I saw her last. She stated she has been compliant with her medications and her weight has been stable. Her only complaint today was that she feels her heart race occasionally after she is active and she had been having intermittent visual disturbances over the past week. She describes these disturbances as "three flashes of light" on both sides or her visual field. She denied any correlating symptoms. I advised she should speak to her PCP about the visual changes and see what they recommend. She was understanding and agreeable. I will follow up next week.   Jacquiline Doe, EMT 01/16/19  ACTION: Home visit completed Next visit planned for 1 week

## 2019-01-17 ENCOUNTER — Telehealth (HOSPITAL_COMMUNITY): Payer: Self-pay | Admitting: Licensed Clinical Social Worker

## 2019-01-17 NOTE — Telephone Encounter (Signed)
CSW reached out to pt to check in regarding food and medication status at this time. Pt did not answer- left message.  CSW encouraged pt to reach out with any concerns and will continue to follow and assist as needed  Jorge Ny, [place] Worker [facility] [phone]

## 2019-01-22 ENCOUNTER — Telehealth (HOSPITAL_COMMUNITY): Payer: Self-pay

## 2019-01-22 NOTE — Telephone Encounter (Signed)
I called Anna Rowe to schedule an appointment. She stated she was a bit overwhelmed this week because her mother was in town and staying with her. She asked if we could reschedule for next week since she was feeling well. I agreed and will reach out then.

## 2019-01-26 ENCOUNTER — Other Ambulatory Visit (HOSPITAL_COMMUNITY): Payer: Self-pay | Admitting: Student

## 2019-01-31 IMAGING — RF DG C-ARM 61-120 MIN-NO REPORT
1 series · 2 of 2 positions shown · non-contrast
Comparison: 04/28/2017

CLINICAL DATA: Right hip replacement.

EXAM:
OPERATIVE RIGHT HIP
TECHNIQUE: Fluoroscopic spot image(s) were submitted for interpretation
post-operatively.

[Series 1: run · 2 of 2 slices shown]
[im 1/2]
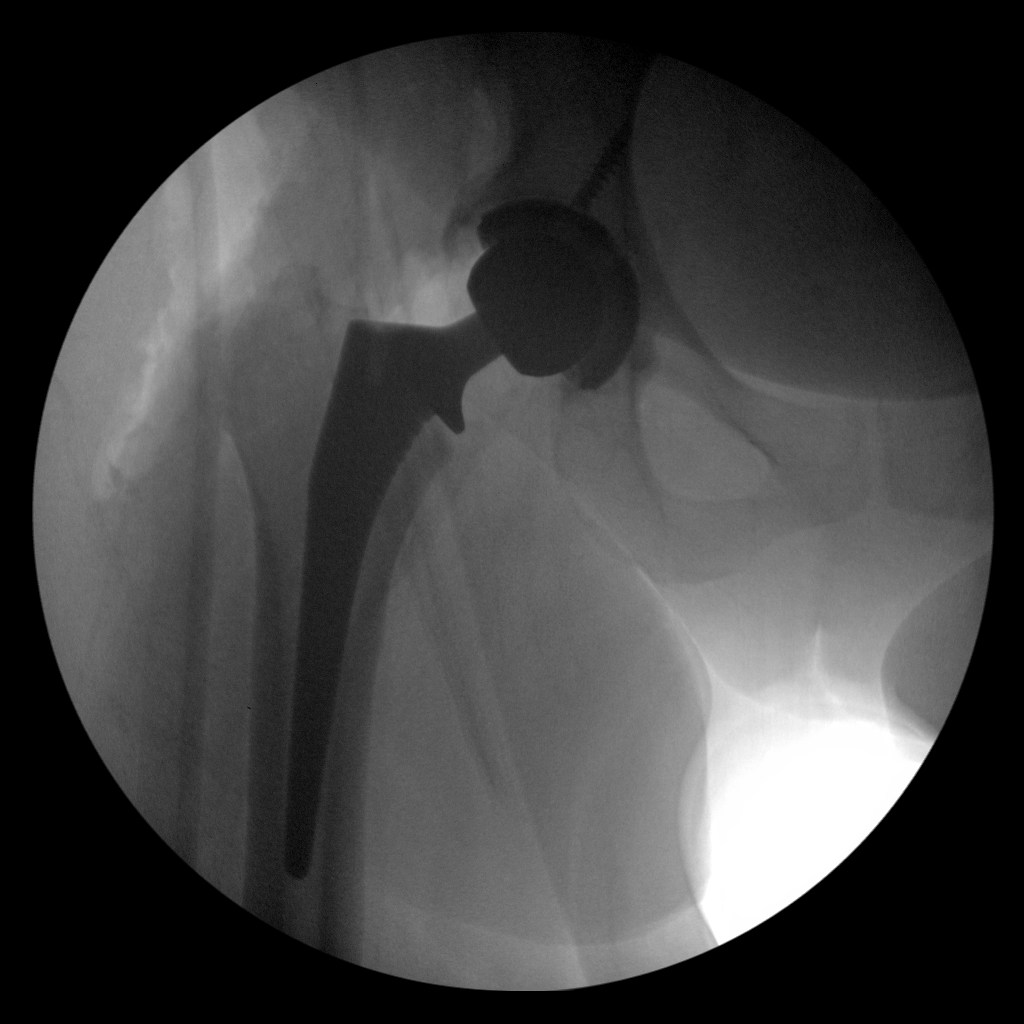
[im 2/2]
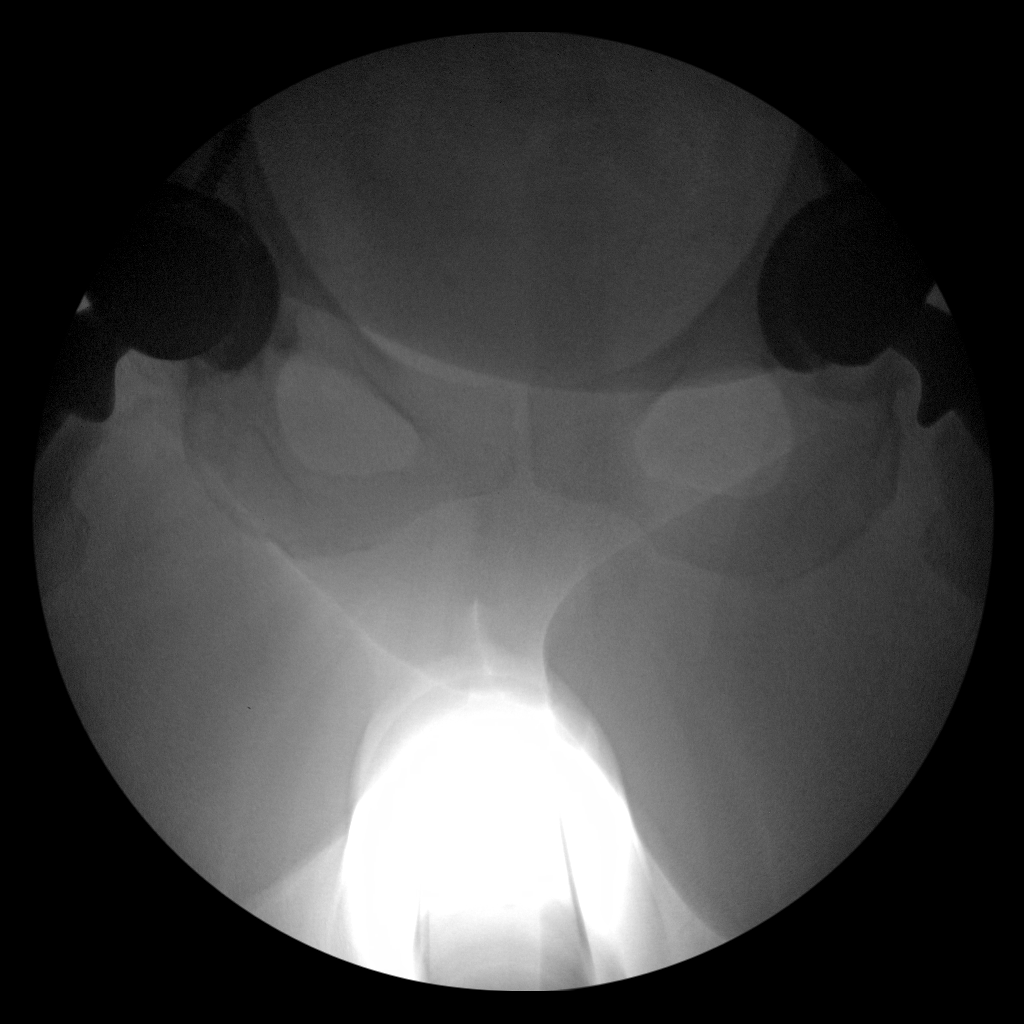

[2 of 2 positions shown; findings below may reference images not displayed]

FINDINGS: A right total hip arthroplasty has been performed. The entire right
femoral stem is visualized without complicating features. Patient
also has a left hip replacement.
IMPRESSION: Right hip arthroplasty.  No complicating features.

## 2019-01-31 IMAGING — DX DG PORTABLE PELVIS
2 series · 2 of 2 positions shown · non-contrast
Comparison: Radiograph dated 04/28/2017

CLINICAL DATA: Osteoarthritis of the right hip. Status post right
total hip replacement.

EXAM:
PORTABLE PELVIS 1-2 VIEWS

[pelvis ap (1 of 2)]
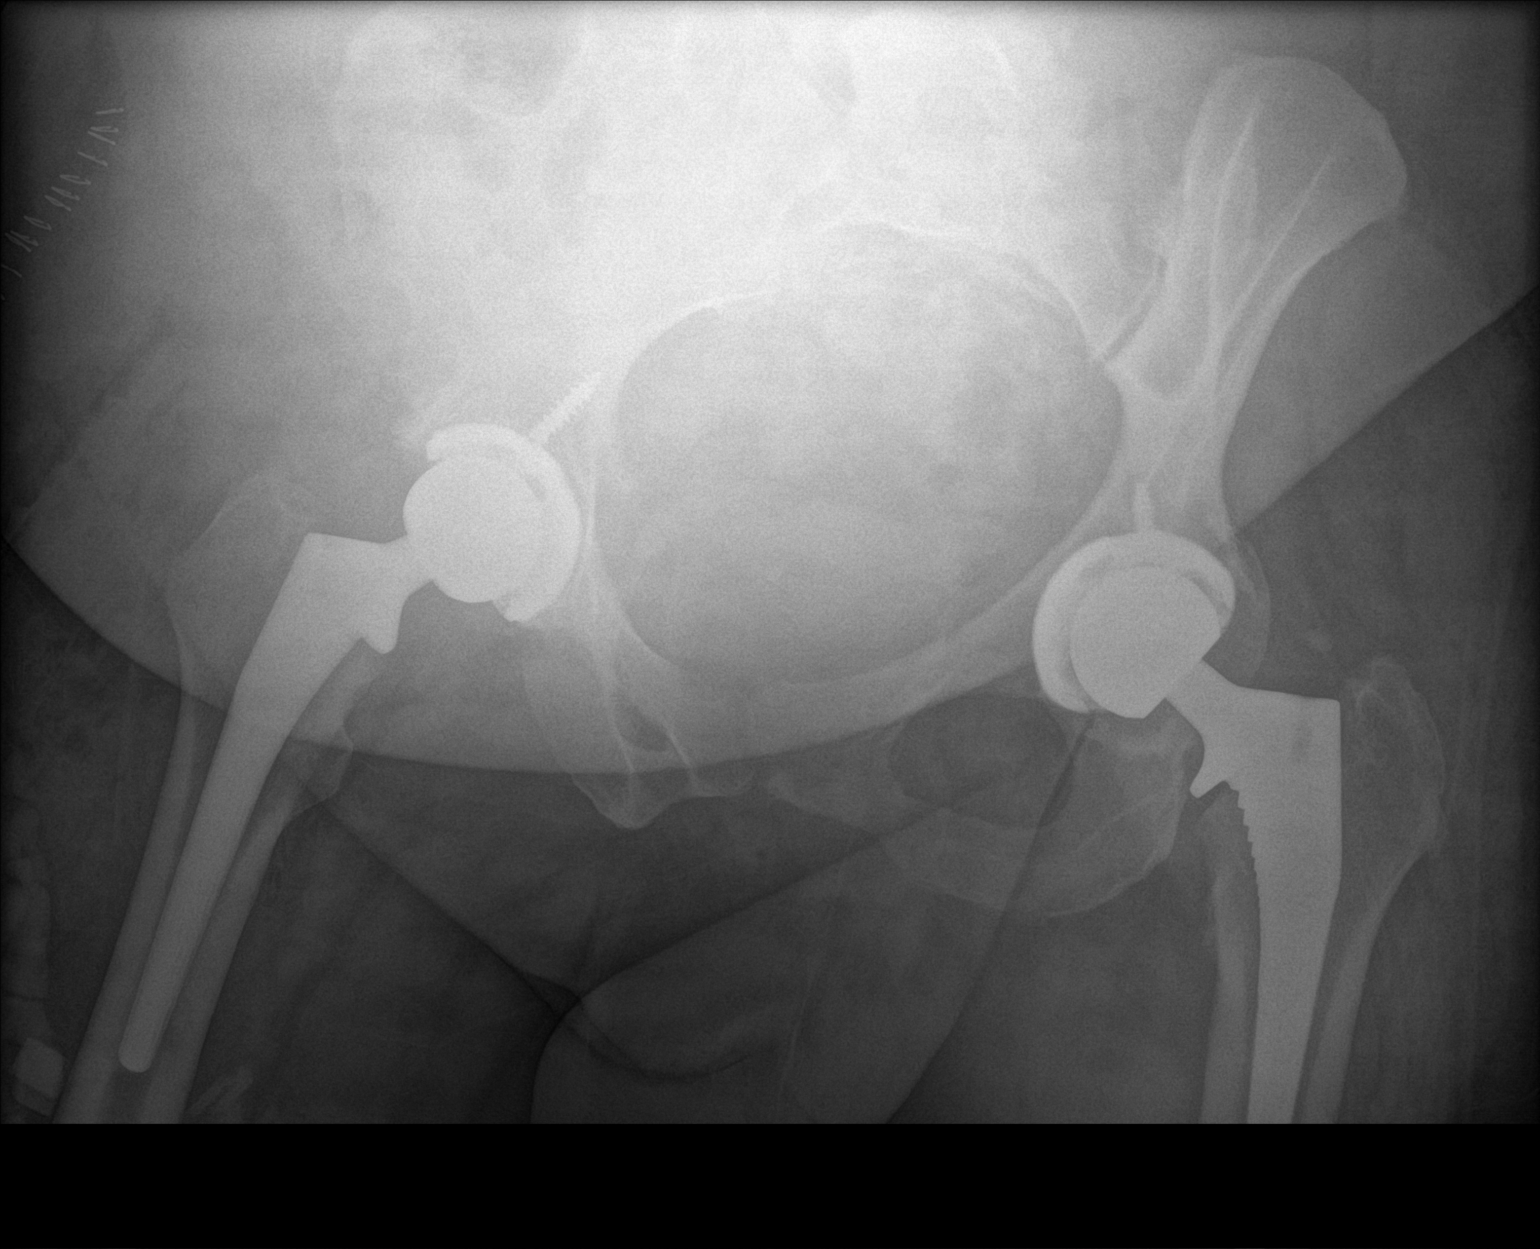

[pelvis ap (2 of 2)]
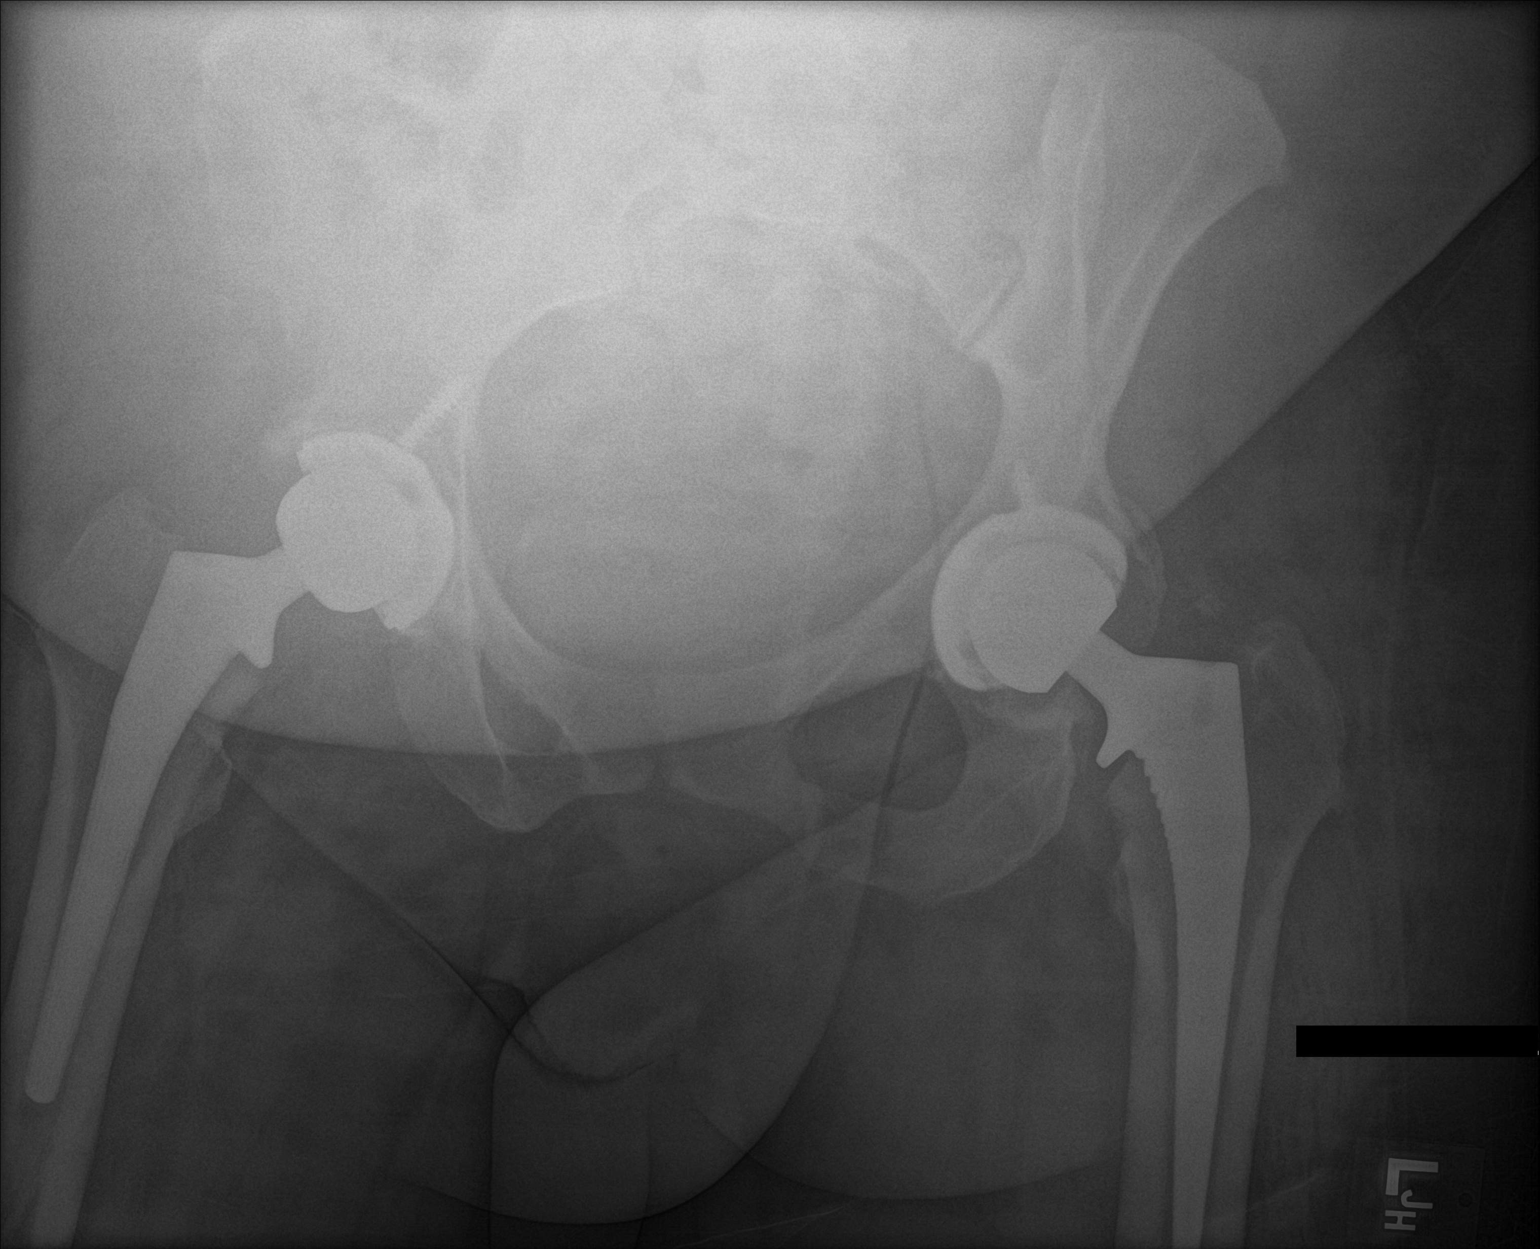

[2 of 2 positions shown; findings below may reference images not displayed]

FINDINGS: AP views of the pelvis demonstrate that the acetabular and femoral
components of the right total hip prosthesis appear in good
position. No fractures.

Left total hip prosthesis also appears in good position.
IMPRESSION: Satisfactory appearance of the right hip in the AP projection after
total hip prosthesis insertion.

## 2019-02-08 ENCOUNTER — Telehealth (HOSPITAL_COMMUNITY): Payer: 59 | Admitting: Internal Medicine

## 2019-02-13 ENCOUNTER — Other Ambulatory Visit (HOSPITAL_COMMUNITY): Payer: Self-pay

## 2019-02-13 NOTE — Progress Notes (Signed)
Paramedicine Encounter    Patient ID: Anna Rowe, female    DOB: 18-Feb-1976, 43 y.o.   MRN: [id number]   Patient Care Team: [facility] [place] as PCP - General  Patient Active Problem List   Diagnosis Date Noted  . Chronic systolic (congestive) heart failure ([place]) 11/08/2018  . CHF (congestive heart failure) ([place]) 08/09/2018  . Acute diastolic CHF (congestive heart failure) ([place]) 08/08/2018  . DM2 (diabetes mellitus, type 2) ([place]) 08/08/2018  . History of DVT (deep vein thrombosis) 08/08/2018  . Unilateral primary osteoarthritis, left knee 08/24/2017  . History of total right knee replacement 08/24/2017  . Chronic bilateral low back pain 08/24/2017  . Status post total replacement of right hip 07/14/2017  . Status post total replacement of left hip 04/28/2017  . Unilateral primary osteoarthritis, left hip 03/09/2017  . Unilateral primary osteoarthritis, right hip 03/09/2017  . Primary osteoarthritis of right knee 04/04/2016    Current Outpatient Medications:  .  amiodarone (PACERONE) 200 MG tablet, Take 1 tablet (200 mg total) by mouth daily., Disp: 90 tablet, Rfl: 3 .  amitriptyline (ELAVIL) 50 MG tablet, Take 1 tablet (50 mg total) by mouth at bedtime., Disp: 30 tablet, Rfl: 2 .  carvedilol (COREG) 6.25 MG tablet, Take 1 tablet (6.25 mg total) by mouth 2 (two) times daily with a meal., Disp: 60 tablet, Rfl: 6 .  cholecalciferol (VITAMIN D3) 25 MCG (1000 UT) tablet, Take 3,000 Units by mouth daily., Disp: , Rfl:  .  cyclobenzaprine (FLEXERIL) 10 MG tablet, Take 10 mg by mouth 3 (three) times daily., Disp: , Rfl: 1 .  digoxin (LANOXIN) 0.125 MG tablet, Take 1 tablet (0.125 mg total) by mouth daily., Disp: 30 tablet, Rfl: 5 .  docusate sodium (COLACE) 100 MG capsule, Take 100 mg by mouth 2 (two) times daily. , Disp: , Rfl: 6 .  Dulaglutide (TRULICITY) 2.99 ME/2.6ST SOPN, Inject 0.75 mg into the skin once a week., Disp: , Rfl:  .  gabapentin (NEURONTIN) 600 MG  tablet, Take 600 mg by mouth 4 (four) times daily. , Disp: , Rfl: 3 .  ipratropium-albuterol (DUONEB) 0.5-2.5 (3) MG/3ML SOLN, Take 3 mLs by nebulization 2 (two) times daily., Disp: 360 mL, Rfl: 0 .  levothyroxine (SYNTHROID) 25 MCG tablet, Take 25 mcg by mouth daily before breakfast., Disp: , Rfl:  .  lubiprostone (AMITIZA) 24 MCG capsule, Take 24 mcg by mouth 2 (two) times daily with a meal., Disp: , Rfl:  .  magnesium oxide (MAG-OX) 400 MG tablet, Take 400 mg by mouth daily., Disp: , Rfl:  .  metFORMIN (GLUCOPHAGE-XR) 500 MG 24 hr tablet, Take 500 mg 2 (two) times daily by mouth. , Disp: , Rfl: 3 .  mometasone-formoterol (DULERA) 200-5 MCG/ACT AERO, Inhale 2 puffs into the lungs 2 (two) times daily., Disp: 2 Inhaler, Rfl: 0 .  Multiple Vitamin (MULTIVITAMIN WITH MINERALS) TABS tablet, Take 1 tablet by mouth daily., Disp: , Rfl:  .  oxyCODONE-acetaminophen (PERCOCET/ROXICET) 5-325 MG tablet, Take 1 tablet by mouth 4 (four) times daily., Disp: , Rfl:  .  OXYCONTIN 10 MG 12 hr tablet, Take 10 mg by mouth every 12 (twelve) hours., Disp: , Rfl: 0 .  potassium chloride SA (K-DUR) 20 MEQ tablet, Take 20 mEq by mouth daily., Disp: , Rfl:  .  rosuvastatin (CRESTOR) 10 MG tablet, Take 1 tablet (10 mg total) by mouth at bedtime., Disp: 30 tablet, Rfl: 5 .  sacubitril-valsartan (ENTRESTO) 97-103 MG, Take 1 tablet by mouth 2 (  two) times daily., Disp: 180 tablet, Rfl: 3 .  spironolactone (ALDACTONE) 25 MG tablet, Take 0.5 tablets (12.5 mg total) by mouth at bedtime., Disp: 30 tablet, Rfl: 5 .  torsemide (DEMADEX) 20 MG tablet, Take 2 tablets (40 mg total) by mouth daily., Disp: 60 tablet, Rfl: 6 .  XARELTO 20 MG TABS tablet, TAKE 1 TABLET BY MOUTH EVERY DAY, Disp: 30 tablet, Rfl: 11 .  Meloxicam 15 MG TBDP, Take 15 mg by mouth daily., Disp: , Rfl:  Allergies  Allergen Reactions  . Methocarbamol Anaphylaxis and Swelling  . Tramadol Anaphylaxis    Tolerates Dilaudid, Oxycontin 04/04/16  . Bee Venom Hives       Social History   Socioeconomic History  . Marital status: Married    Spouse name: Not on file  . Number of children: Not on file  . Years of education: Not on file  . Highest education level: Not on file  Occupational History  . Not on file  Social Needs  . Financial resource strain: Not on file  . Food insecurity    Worry: Not on file    Inability: Not on file  . Transportation needs    Medical: Not on file    Non-medical: Not on file  Tobacco Use  . Smoking status: Former Smoker    Packs/day: 0.50    Years: 17.00    Pack years: 8.50    Types: Cigarettes    Quit date: 08/07/2018    Years since quitting: 0.5  . Smokeless tobacco: Never Used  . Tobacco comment: 3 Loose cigarettes in last week  Substance and Sexual Activity  . Alcohol use: No    Comment: seldom   . Drug use: No  . Sexual activity: Not Currently  Lifestyle  . Physical activity    Days per week: Not on file    Minutes per session: Not on file  . Stress: Not on file  Relationships  . Social Herbalist on phone: Not on file    Gets together: Not on file    Attends religious service: Not on file    Active member of club or organization: Not on file    Attends meetings of clubs or organizations: Not on file    Relationship status: Not on file  . Intimate partner violence    Fear of current or ex partner: Not on file    Emotionally abused: Not on file    Physically abused: Not on file    Forced sexual activity: Not on file  Other Topics Concern  . Not on file  Social History Narrative  . Not on file    Physical Exam Cardiovascular:     Rate and Rhythm: Normal rate and regular rhythm.     Pulses: Normal pulses.  Pulmonary:     Effort: Pulmonary effort is normal.     Breath sounds: Normal breath sounds.  Musculoskeletal: Normal range of motion.     Right lower leg: No edema.     Left lower leg: No edema.  Skin:    General: Skin is warm and dry.     Capillary Refill:  Capillary refill takes less than 2 seconds.  Neurological:     Mental Status: She is alert and oriented to person, place, and time.  Psychiatric:        Mood and Affect: Mood normal.         Future Appointments  Date Time Provider [place]  02/14/2019  11:40 AM Bensimhon, Shaune Pascal, MD [facility] None    BP 106/66 (BP Location: Left Arm, Patient Position: Sitting, Cuff Size: Normal)   Pulse 70   Resp 16   Wt 277 lb (125.6 kg)   SpO2 98%   BMI 37.57 kg/m   Weight yesterday- 279 lb Last visit weight- 279.6 lb  Ms Schlosser was seen at home today and reported feeling generally well. She denied chest pain, SOB, headache, dizziness, orthopnea, cough or fever since our last visit. She stated she has been compliant with her medications over the past several weeks and her weight has been stable. She reported that she has been cleaning out her late mother's house so she has been more active than usual but she is only able to complete roughly 10 minutes of ADL's before becoming SOB and needing to rest but she has not had any palpations. In order to assess for frequent, multifocal PVC's I placed he ron the cardiac monitor to assess her rhythm. Her PR interval appeared borderline so I completed a 12-lead ECG to get a more accurate picture. She was noted to be in a sinus rhythm without PVC's, a PR-interval of 0.190s and a QTc of 0.413s. She has a virtual visit with Dr Haroldine Laws tomorrow so I will follow up if any changes are needed.    Jacquiline Doe, EMT 02/13/19  ACTION: Home visit completed Next visit planned for 1 week

## 2019-02-14 ENCOUNTER — Other Ambulatory Visit: Payer: Self-pay

## 2019-02-14 ENCOUNTER — Ambulatory Visit (HOSPITAL_COMMUNITY)
Admission: RE | Admit: 2019-02-14 | Discharge: 2019-02-14 | Disposition: A | Discharge status: Home / Self Care | Payer: 59 | Source: Ambulatory Visit | Attending: Internal Medicine | Admitting: Internal Medicine

## 2019-02-14 DIAGNOSIS — I5022 Chronic systolic (congestive) heart failure: Secondary | ICD-10-CM

## 2019-02-14 DIAGNOSIS — I493 Ventricular premature depolarization: Secondary | ICD-10-CM | POA: Diagnosis not present

## 2019-02-14 NOTE — Progress Notes (Signed)
Heart Failure TeleHealth Note  Due to national recommendations of social distancing due to [place] 19, Audio/video telehealth visit is felt to be most appropriate for this patient at this time.  See MyChart message from today for patient consent regarding telehealth for [facility].  Date:  02/14/2019   ID:  Anna Rowe, DOB 14-Aug-1976, MRN [id number]  Location: Home  Provider location: [facility] Type of Visit: Established patient  PCP:  [place]  Cardiologist:  No primary care provider on file. Primary HF: Baley Lorimer  Chief Complaint: Heart Failure follow-up   History of Present Illness:  Anna Rowe is a 43 y.o. female with a history of hyperlipidemia, DM, COPD, sleep apnea noncompliant with CPAP, chronic DVT(diagnosed 2018)on Xarelto, tobacco use,right knee replacement 2015,and COPD.She has had RTKR and R/L hip replacements.    Of note, she states that her dad had HF and received an LVAD, after a similar "viral cardiomyopathy" type picture  Admitted to [facility] 09/03/18 - 08/15/2018 with worsening LE edema. Echo showed newly reduced EF 15-20%. [facility] 12/27 showed marked volume overload and low output and minimal nonobstructive CAD. Diuresed on milrinone, and weaned off as tolerated as medications titrated.    Monitor 4/20 -> started on amio for PVC suppression  1. Predominant rhythm is sinus. Patient had a min HR of 71 bpm, max HR of 174 bpm, and avg HR of 98 bpm. 2. 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 Fifty episodes of brief NSVT up to 6 beats 4. Several patient-triggered events associated with either PVCs or normal sinus rhythm  Had echo 12/26/18 EF 25-30%   She presents today via Engineer, civil (consulting). Follows with Paramedicine with Thedore Mins every Wednesday. She was seen yesterday and he said was doing well. Hooked up to ECG  and no PVCs! Taking torsemide 40 daily. Not needing extra. Weight stable 277-279. No orthopnea, PND r edema. Trying to be more active. Mother died a few weeks ago.   Echo01/20/2020:EF 15-20%, severe diffuse HK and septal dyskinesis, grade 2 DD, mod MR, LA moderately dilated, RV moderately dilated with moderately reduced function, PA peak pressure 35 mm Hg  Cath 12/27 Findings: Ao = 99/75 (81) LV = 93/31 RA = 22 RV = 54/25 PA = 58/24 (41) PCW = 30 Fick cardiac output/index = 4.1/1.6 SVR = 1163 PVR = 2.6 FA sat = 89% PA sat = 42%, 44% Assessment: 1. 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 Severe NICM with LVEF ~ 15% 3. Marked volume overload with low output   Anna Rowe denies symptoms worrisome for COVID 19.   Past Medical History:  Diagnosis Date  . Arthritis   . CHF (congestive heart failure) ([place])   . COPD (chronic obstructive pulmonary disease) ([place])   . Diabetes mellitus ([place])    type 2   . DVT (deep venous thrombosis) ([place]) dx 10-2016   RLE   . HLD (hyperlipidemia)    on crestor  . Pneumonia    09/2016  . Sleep apnea    per patient ;been dx does not use CPAP does not tolerate   Past Surgical History:  Procedure Laterality Date  . ACHILLES TENDON REPAIR    . HERNIA REPAIR     x2; hiatal and umbilical   . IVC FILTER INSERTION N/A 04/25/2017   Procedure: IVC Filter Insertion;  Surgeon: Serafina Mitchell, MD;  Location: [place] [facility];  Service: Cardiovascular;  Laterality: N/A;  . IVC FILTER REMOVAL N/A 10/24/2017   Procedure: IVC FILTER REMOVAL;  Surgeon: Serafina Mitchell, MD;  Location: [place] [facility];  Service: Cardiovascular;  Laterality: N/A;  . JOINT REPLACEMENT     right hip 07/14/17 Dr. Ninfa Linden  . KNEE ARTHROSCOPY Right 2015  . plantar    . RIGHT/LEFT HEART CATH AND CORONARY ANGIOGRAPHY N/A 08/10/2018   Procedure: RIGHT/LEFT HEART CATH AND CORONARY ANGIOGRAPHY;  Surgeon: Jolaine Artist, MD;  Location: [place] [facility];   Service: Cardiovascular;  Laterality: N/A;  . TOTAL HIP ARTHROPLASTY Left 04/28/2017   Procedure: LEFT TOTAL HIP ARTHROPLASTY ANTERIOR APPROACH;  Surgeon: Mcarthur Rossetti, MD;  Location: [facility];  Service: Orthopedics;  Laterality: Left;  . TOTAL HIP ARTHROPLASTY Right 07/14/2017   Procedure: RIGHT TOTAL HIP ARTHROPLASTY ANTERIOR APPROACH;  Surgeon: Mcarthur Rossetti, MD;  Location: [facility];  Service: Orthopedics;  Laterality: Right;  . TOTAL KNEE ARTHROPLASTY Right 04/04/2016   Procedure: TOTAL KNEE ARTHROPLASTY;  Surgeon: Dorna Leitz, MD;  Location: [place];  Service: Orthopedics;  Laterality: Right;     Current Outpatient Medications  Medication Sig Dispense Refill  . amiodarone (PACERONE) 200 MG tablet Take 1 tablet (200 mg total) by mouth daily. 90 tablet 3  . amitriptyline (ELAVIL) 50 MG tablet Take 1 tablet (50 mg total) by mouth at bedtime. 30 tablet 2  . carvedilol (COREG) 6.25 MG tablet Take 1 tablet (6.25 mg total) by mouth 2 (two) times daily with a meal. 60 tablet 6  . cholecalciferol (VITAMIN D3) 25 MCG (1000 UT) tablet Take 3,000 Units by mouth daily.    . cyclobenzaprine (FLEXERIL) 10 MG tablet Take 10 mg by mouth 3 (three) times daily.  1  . digoxin (LANOXIN) 0.125 MG tablet Take 1 tablet (0.125 mg total) by mouth daily. 30 tablet 5  . docusate sodium (COLACE) 100 MG capsule Take 100 mg by mouth 2 (two) times daily.   6  . Dulaglutide (TRULICITY) 8.12 XN/1.7GY SOPN Inject 0.75 mg into the skin once a week.    . gabapentin (NEURONTIN) 600 MG tablet Take 600 mg by mouth 4 (four) times daily.   3  . ipratropium-albuterol (DUONEB) 0.5-2.5 (3) MG/3ML SOLN Take 3 mLs by nebulization 2 (two) times daily. 360 mL 0  . levothyroxine (SYNTHROID) 25 MCG tablet Take 25 mcg by mouth daily before breakfast.    . lubiprostone (AMITIZA) 24 MCG capsule Take 24 mcg by mouth 2 (two) times daily with a meal.    . magnesium oxide (MAG-OX) 400 MG tablet Take 400 mg by mouth daily.    .  Meloxicam 15 MG TBDP Take 15 mg by mouth daily.    . metFORMIN (GLUCOPHAGE-XR) 500 MG 24 hr tablet Take 500 mg 2 (two) times daily by mouth.   3  . mometasone-formoterol (DULERA) 200-5 MCG/ACT AERO Inhale 2 puffs into the lungs 2 (two) times daily. 2 Inhaler 0  . Multiple Vitamin (MULTIVITAMIN WITH MINERALS) TABS tablet Take 1 tablet by mouth daily.    Marland Kitchen oxyCODONE-acetaminophen (PERCOCET/ROXICET) 5-325 MG tablet Take 1 tablet by mouth 4 (four) times daily.    . OXYCONTIN 10 MG 12 hr tablet Take 10 mg by mouth every 12 (twelve) hours.  0  . potassium chloride SA (K-DUR) 20 MEQ tablet Take 20 mEq by mouth daily.    . rosuvastatin (CRESTOR) 10 MG tablet Take 1 tablet (10 mg total) by mouth at bedtime. 30 tablet  5  . sacubitril-valsartan (ENTRESTO) 97-103 MG Take 1 tablet by mouth 2 (two) times daily. 180 tablet 3  . spironolactone (ALDACTONE) 25 MG tablet Take 0.5 tablets (12.5 mg total) by mouth at bedtime. 30 tablet 5  . torsemide (DEMADEX) 20 MG tablet Take 2 tablets (40 mg total) by mouth daily. 60 tablet 6  . XARELTO 20 MG TABS tablet TAKE 1 TABLET BY MOUTH EVERY DAY 30 tablet 11   No current facility-administered medications for this encounter.     Allergies:   Methocarbamol, Tramadol, and Bee venom   Social History:  The patient  reports that she quit smoking about 6 months ago. Her smoking use included cigarettes. She has a 8.50 pack-year smoking history. She has never used smokeless tobacco. She reports that she does not drink alcohol or use drugs.   Family History:  The patient's family history includes Hypertension in her father, mother, and sister.   ROS:  Please see the history of present illness.   All other systems are personally reviewed and negative.   Exam:  (Video/Tele Health Call; Exam is subjective and or/visual.) General:  Speaks in full sentences. No resp difficulty. Lungs: Normal respiratory effort with conversation.  Abdomen: Obese. Non-distended per patient report  Extremities: Pt denies edema. Neuro: Alert & oriented x 3.   Recent Labs: 12/12/2018: ALT 19; B Natriuretic Peptide 177.8; BUN 13; Creatinine, Ser 1.29; Hemoglobin 12.8; Magnesium 1.7; Platelets 393; Potassium 4.5; Sodium 136; TSH 3.158  Personally reviewed   Wt Readings from Last 3 Encounters:  02/13/19 125.6 kg (277 lb)  01/16/19 126.8 kg (279 lb 9.6 oz)  01/02/19 127.3 kg (280 lb 9.6 oz)      ASSESSMENT AND PLAN:  1. Chronic systolic HF due to NICM. Suspect possible PVC related vs viral or OSA  - Echo 12/25: EF 15-20%, severe diffuse HK and septal dyskinesis, grade 2 DD, mod MR, LA moderately dilated, RV moderately dilated with moderately reduced function, PA peak pressure 35 mm Hg. - Echo 5/20 EF 25-30% (improving with PVC suppression) - hold off on ICD eval for now - [facility] 12/19 showed marked volume overload with low output, severe NICM with EF 15%, and minimal non-obstructive CAD.She has had a hysterectomy .  - No MRI with hip replacements and size - Improved NYHA II. Volume status appears stable.    -Continue digoxin 0.125 daily, dig level ok  - Continue Coreg 6.25 twice a day.  - Continue Entresto 97/103 mg  twice a day.    - Continue spiro 12.5 mg daily. -  Dr. Broadus John for genetic screening with strong family history of NICM cardiomyopathy. (Her father, and his mother)  2. Minimal nonobstructive CAD: - LHC 12/27: 30% Prox LAD - No s/s ischemia - Continue crestor. -No ASA with Xarelto use  3. DVT - Continue Xarelto 20 mg daily. No bleeding.   4. OSA: Continue CPAP nightly.[place] working on getting Bipap   5. COPD: - Continue chantix.   6. NSVT/PVCs  - Zio Patch placed having 10% PVC burden. Having at least 2 different morphologies.  - Started amio 200 mg twice a day on 12/05/2018.  - Has total PVC suppression on last visit with Paramedicine - Drop amio to 200 daily  7. DM2 - consider SGLT2i  COVID screen The patient does not have any  symptoms that suggest any further testing/ screening at this time.  Social distancing reinforced today.  Recommended follow-up:  As above  Relevant cardiac medications were reviewed  at length with the patient today.   The patient does not have concerns regarding their medications at this time.   The following changes were made today:  As above  Today, I have spent 12 minutes with the patient with telehealth technology discussing the above issues .    Signed, Glori Bickers, MD  02/14/2019 12:57 PM  [facility] [place] [address] [facility] [place] [address] [phone] (office) [phone] (fax)

## 2019-02-18 NOTE — Progress Notes (Signed)
Visit with echo in 2 months    Order placed per Dr Haroldine Laws, message sent to scheduler to arrange.

## 2019-02-18 NOTE — Addendum Note (Signed)
Encounter addended by: Scarlette Calico, RN on: 02/18/2019 4:30 PM  Actions taken: Clinical Note Signed, Order list changed, Diagnosis association updated

## 2019-02-20 ENCOUNTER — Telehealth (HOSPITAL_COMMUNITY): Payer: Self-pay

## 2019-02-21 NOTE — Telephone Encounter (Signed)
I called Anna Rowe to schedule an appointment. She did not answer so I left a message and requested she call me back.

## 2019-02-27 ENCOUNTER — Other Ambulatory Visit (HOSPITAL_COMMUNITY): Payer: Self-pay

## 2019-02-27 NOTE — Progress Notes (Signed)
Paramedicine Encounter    Patient ID: Anna Rowe, female    DOB: Sep 05, 1975, 43 y.o.   MRN: [id number]   Patient Care Team: [facility] as PCP - General  Patient Active Problem List   Diagnosis Date Noted  . Chronic systolic (congestive) heart failure ([place]) 11/08/2018  . CHF (congestive heart failure) ([place]) 08/09/2018  . Acute diastolic CHF (congestive heart failure) ([place]) 08/08/2018  . DM2 (diabetes mellitus, type 2) ([place]) 08/08/2018  . History of DVT (deep vein thrombosis) 08/08/2018  . Unilateral primary osteoarthritis, left knee 08/24/2017  . History of total right knee replacement 08/24/2017  . Chronic bilateral low back pain 08/24/2017  . Status post total replacement of right hip 07/14/2017  . Status post total replacement of left hip 04/28/2017  . Unilateral primary osteoarthritis, left hip 03/09/2017  . Unilateral primary osteoarthritis, right hip 03/09/2017  . Primary osteoarthritis of right knee 04/04/2016    Current Outpatient Medications:  .  amiodarone (PACERONE) 200 MG tablet, Take 1 tablet (200 mg total) by mouth daily., Disp: 90 tablet, Rfl: 3 .  amitriptyline (ELAVIL) 50 MG tablet, Take 1 tablet (50 mg total) by mouth at bedtime., Disp: 30 tablet, Rfl: 2 .  carvedilol (COREG) 6.25 MG tablet, Take 1 tablet (6.25 mg total) by mouth 2 (two) times daily with a meal., Disp: 60 tablet, Rfl: 6 .  cholecalciferol (VITAMIN D3) 25 MCG (1000 UT) tablet, Take 3,000 Units by mouth daily., Disp: , Rfl:  .  cyclobenzaprine (FLEXERIL) 10 MG tablet, Take 10 mg by mouth 3 (three) times daily., Disp: , Rfl: 1 .  digoxin (LANOXIN) 0.125 MG tablet, Take 1 tablet (0.125 mg total) by mouth daily., Disp: 30 tablet, Rfl: 5 .  docusate sodium (COLACE) 100 MG capsule, Take 100 mg by mouth 2 (two) times daily. , Disp: , Rfl: 6 .  Dulaglutide (TRULICITY) 7.40 CX/4.4YJ SOPN, Inject 0.75 mg into the skin once a week., Disp: , Rfl:  .  gabapentin (NEURONTIN) 600 MG  tablet, Take 600 mg by mouth 4 (four) times daily. , Disp: , Rfl: 3 .  ipratropium-albuterol (DUONEB) 0.5-2.5 (3) MG/3ML SOLN, Take 3 mLs by nebulization 2 (two) times daily., Disp: 360 mL, Rfl: 0 .  levothyroxine (SYNTHROID) 25 MCG tablet, Take 25 mcg by mouth daily before breakfast., Disp: , Rfl:  .  lubiprostone (AMITIZA) 24 MCG capsule, Take 24 mcg by mouth 2 (two) times daily with a meal., Disp: , Rfl:  .  magnesium oxide (MAG-OX) 400 MG tablet, Take 400 mg by mouth daily., Disp: , Rfl:  .  Meloxicam 15 MG TBDP, Take 15 mg by mouth daily., Disp: , Rfl:  .  metFORMIN (GLUCOPHAGE-XR) 500 MG 24 hr tablet, Take 500 mg 2 (two) times daily by mouth. , Disp: , Rfl: 3 .  mometasone-formoterol (DULERA) 200-5 MCG/ACT AERO, Inhale 2 puffs into the lungs 2 (two) times daily., Disp: 2 Inhaler, Rfl: 0 .  Multiple Vitamin (MULTIVITAMIN WITH MINERALS) TABS tablet, Take 1 tablet by mouth daily., Disp: , Rfl:  .  oxyCODONE-acetaminophen (PERCOCET/ROXICET) 5-325 MG tablet, Take 1 tablet by mouth 4 (four) times daily., Disp: , Rfl:  .  OXYCONTIN 10 MG 12 hr tablet, Take 10 mg by mouth every 12 (twelve) hours., Disp: , Rfl: 0 .  potassium chloride SA (K-DUR) 20 MEQ tablet, Take 20 mEq by mouth daily., Disp: , Rfl:  .  rosuvastatin (CRESTOR) 10 MG tablet, Take 1 tablet (10 mg total) by mouth at bedtime., Disp:  30 tablet, Rfl: 5 .  sacubitril-valsartan (ENTRESTO) 97-103 MG, Take 1 tablet by mouth 2 (two) times daily., Disp: 180 tablet, Rfl: 3 .  spironolactone (ALDACTONE) 25 MG tablet, Take 0.5 tablets (12.5 mg total) by mouth at bedtime., Disp: 30 tablet, Rfl: 5 .  torsemide (DEMADEX) 20 MG tablet, Take 2 tablets (40 mg total) by mouth daily., Disp: 60 tablet, Rfl: 6 .  XARELTO 20 MG TABS tablet, TAKE 1 TABLET BY MOUTH EVERY DAY, Disp: 30 tablet, Rfl: 11 Allergies  Allergen Reactions  . Methocarbamol Anaphylaxis and Swelling  . Tramadol Anaphylaxis    Tolerates Dilaudid, Oxycontin 04/04/16  . Bee Venom Hives       Social History   Socioeconomic History  . Marital status: Married    Spouse name: Not on file  . Number of children: Not on file  . Years of education: Not on file  . Highest education level: Not on file  Occupational History  . Not on file  Social Needs  . Financial resource strain: Not on file  . Food insecurity    Worry: Not on file    Inability: Not on file  . Transportation needs    Medical: Not on file    Non-medical: Not on file  Tobacco Use  . Smoking status: Former Smoker    Packs/day: 0.50    Years: 17.00    Pack years: 8.50    Types: Cigarettes    Quit date: 08/07/2018    Years since quitting: 0.5  . Smokeless tobacco: Never Used  . Tobacco comment: 3 Loose cigarettes in last week  Substance and Sexual Activity  . Alcohol use: No    Comment: seldom   . Drug use: No  . Sexual activity: Not Currently  Lifestyle  . Physical activity    Days per week: Not on file    Minutes per session: Not on file  . Stress: Not on file  Relationships  . Social Herbalist on phone: Not on file    Gets together: Not on file    Attends religious service: Not on file    Active member of club or organization: Not on file    Attends meetings of clubs or organizations: Not on file    Relationship status: Not on file  . Intimate partner violence    Fear of current or ex partner: Not on file    Emotionally abused: Not on file    Physically abused: Not on file    Forced sexual activity: Not on file  Other Topics Concern  . Not on file  Social History Narrative  . Not on file    Physical Exam Cardiovascular:     Rate and Rhythm: Normal rate and regular rhythm.     Pulses: Normal pulses.  Pulmonary:     Effort: Pulmonary effort is normal.     Breath sounds: Normal breath sounds.  Abdominal:     General: There is no distension.  Musculoskeletal: Normal range of motion.     Right lower leg: No edema.     Left lower leg: No edema.  Skin:    General:  Skin is warm and dry.     Capillary Refill: Capillary refill takes less than 2 seconds.  Neurological:     Mental Status: She is alert and oriented to person, place, and time.  Psychiatric:        Mood and Affect: Mood normal.  Future Appointments  Date Time Provider [place]  04/23/2019 10:15 AM [facility] ECHO OP 2 [facility]  04/23/2019 11:40 AM Bensimhon, Shaune Pascal, MD [facility] None    BP 102/75 (BP Location: Left Arm, Patient Position: Sitting, Cuff Size: Normal)   Pulse 82   Resp 16   Wt 274 lb 3.2 oz (124.4 kg)   SpO2 98%   BMI 37.19 kg/m   Weight yesterday- 274.8 lb Last visit weight- 277 lb  Ms Brick was seen at home today and reported feeling well. She denied chest pain, SOB, headache, dizziness, orthopnea, cough or fever since our last visit. She stated she has been compliant with her medications over the past week and her weight has been stable. Her medications were verified and she is handling the filling of her pillbox without my assistance. She is nearing being ready for discharge which we will discuss during our next visit in two weeks.   Jacquiline Doe, EMT 02/27/19  ACTION: Home visit completed Next visit planned for 2 weeks

## 2019-03-11 ENCOUNTER — Other Ambulatory Visit (HOSPITAL_COMMUNITY): Payer: Self-pay | Admitting: Student

## 2019-03-12 ENCOUNTER — Telehealth (HOSPITAL_COMMUNITY): Payer: Self-pay

## 2019-03-12 NOTE — Telephone Encounter (Signed)
I called Anna Rowe to schedule an appointment for tomorrow. She stated she was available all day so we agreed to meet at 10:00.

## 2019-03-13 ENCOUNTER — Other Ambulatory Visit (HOSPITAL_COMMUNITY): Payer: Self-pay

## 2019-03-13 NOTE — Progress Notes (Signed)
Paramedicine Encounter    Patient ID: Anna Rowe, female    DOB: 05-10-1976, 43 y.o.   MRN: [id number]   Patient Care Team: [facility] [place] as PCP - General  Patient Active Problem List   Diagnosis Date Noted  . Chronic systolic (congestive) heart failure ([place]) 11/08/2018  . CHF (congestive heart failure) ([place]) 08/09/2018  . Acute diastolic CHF (congestive heart failure) ([place]) 08/08/2018  . DM2 (diabetes mellitus, type 2) ([place]) 08/08/2018  . History of DVT (deep vein thrombosis) 08/08/2018  . Unilateral primary osteoarthritis, left knee 08/24/2017  . History of total right knee replacement 08/24/2017  . Chronic bilateral low back pain 08/24/2017  . Status post total replacement of right hip 07/14/2017  . Status post total replacement of left hip 04/28/2017  . Unilateral primary osteoarthritis, left hip 03/09/2017  . Unilateral primary osteoarthritis, right hip 03/09/2017  . Primary osteoarthritis of right knee 04/04/2016    Current Outpatient Medications:  .  amiodarone (PACERONE) 200 MG tablet, Take 1 tablet (200 mg total) by mouth daily., Disp: 90 tablet, Rfl: 3 .  amitriptyline (ELAVIL) 50 MG tablet, Take 1 tablet (50 mg total) by mouth at bedtime., Disp: 30 tablet, Rfl: 2 .  carvedilol (COREG) 6.25 MG tablet, Take 1 tablet (6.25 mg total) by mouth 2 (two) times daily with a meal., Disp: 60 tablet, Rfl: 6 .  cholecalciferol (VITAMIN D3) 25 MCG (1000 UT) tablet, Take 3,000 Units by mouth daily., Disp: , Rfl:  .  cyclobenzaprine (FLEXERIL) 10 MG tablet, Take 10 mg by mouth 3 (three) times daily., Disp: , Rfl: 1 .  digoxin (LANOXIN) 0.125 MG tablet, TAKE 1 TABLET BY MOUTH DAILY, Disp: 30 tablet, Rfl: 5 .  docusate sodium (COLACE) 100 MG capsule, Take 100 mg by mouth 2 (two) times daily. , Disp: , Rfl: 6 .  Dulaglutide (TRULICITY) 3.09 MM/7.6KG SOPN, Inject 0.75 mg into the skin once a week., Disp: , Rfl:  .  gabapentin (NEURONTIN) 600 MG tablet, Take 600 mg  by mouth 4 (four) times daily. , Disp: , Rfl: 3 .  ipratropium-albuterol (DUONEB) 0.5-2.5 (3) MG/3ML SOLN, Take 3 mLs by nebulization 2 (two) times daily., Disp: 360 mL, Rfl: 0 .  levothyroxine (SYNTHROID) 25 MCG tablet, Take 25 mcg by mouth daily before breakfast., Disp: , Rfl:  .  lubiprostone (AMITIZA) 24 MCG capsule, Take 24 mcg by mouth 2 (two) times daily with a meal., Disp: , Rfl:  .  magnesium oxide (MAG-OX) 400 MG tablet, Take 400 mg by mouth daily., Disp: , Rfl:  .  Meloxicam 15 MG TBDP, Take 15 mg by mouth daily., Disp: , Rfl:  .  metFORMIN (GLUCOPHAGE-XR) 500 MG 24 hr tablet, Take 500 mg 2 (two) times daily by mouth. , Disp: , Rfl: 3 .  mometasone-formoterol (DULERA) 200-5 MCG/ACT AERO, Inhale 2 puffs into the lungs 2 (two) times daily., Disp: 2 Inhaler, Rfl: 0 .  Multiple Vitamin (MULTIVITAMIN WITH MINERALS) TABS tablet, Take 1 tablet by mouth daily., Disp: , Rfl:  .  oxyCODONE-acetaminophen (PERCOCET/ROXICET) 5-325 MG tablet, Take 1 tablet by mouth 4 (four) times daily., Disp: , Rfl:  .  OXYCONTIN 10 MG 12 hr tablet, Take 10 mg by mouth every 12 (twelve) hours., Disp: , Rfl: 0 .  potassium chloride SA (K-DUR) 20 MEQ tablet, Take 20 mEq by mouth daily., Disp: , Rfl:  .  rosuvastatin (CRESTOR) 10 MG tablet, Take 1 tablet (10 mg total) by mouth at bedtime., Disp: 30 tablet, Rfl:  5 .  sacubitril-valsartan (ENTRESTO) 97-103 MG, Take 1 tablet by mouth 2 (two) times daily., Disp: 180 tablet, Rfl: 3 .  spironolactone (ALDACTONE) 25 MG tablet, Take 0.5 tablets (12.5 mg total) by mouth at bedtime., Disp: 30 tablet, Rfl: 5 .  torsemide (DEMADEX) 20 MG tablet, Take 2 tablets (40 mg total) by mouth daily., Disp: 60 tablet, Rfl: 6 .  XARELTO 20 MG TABS tablet, TAKE 1 TABLET BY MOUTH EVERY DAY, Disp: 30 tablet, Rfl: 11 Allergies  Allergen Reactions  . Methocarbamol Anaphylaxis and Swelling  . Tramadol Anaphylaxis    Tolerates Dilaudid, Oxycontin 04/04/16  . Bee Venom Hives      Social  History   Socioeconomic History  . Marital status: Married    Spouse name: Not on file  . Number of children: Not on file  . Years of education: Not on file  . Highest education level: Not on file  Occupational History  . Not on file  Social Needs  . Financial resource strain: Not on file  . Food insecurity    Worry: Not on file    Inability: Not on file  . Transportation needs    Medical: Not on file    Non-medical: Not on file  Tobacco Use  . Smoking status: Former Smoker    Packs/day: 0.50    Years: 17.00    Pack years: 8.50    Types: Cigarettes    Quit date: 08/07/2018    Years since quitting: 0.5  . Smokeless tobacco: Never Used  . Tobacco comment: 3 Loose cigarettes in last week  Substance and Sexual Activity  . Alcohol use: No    Comment: seldom   . Drug use: No  . Sexual activity: Not Currently  Lifestyle  . Physical activity    Days per week: Not on file    Minutes per session: Not on file  . Stress: Not on file  Relationships  . Social Herbalist on phone: Not on file    Gets together: Not on file    Attends religious service: Not on file    Active member of club or organization: Not on file    Attends meetings of clubs or organizations: Not on file    Relationship status: Not on file  . Intimate partner violence    Fear of current or ex partner: Not on file    Emotionally abused: Not on file    Physically abused: Not on file    Forced sexual activity: Not on file  Other Topics Concern  . Not on file  Social History Narrative  . Not on file    Physical Exam Cardiovascular:     Rate and Rhythm: Normal rate and regular rhythm.     Pulses: Normal pulses.  Pulmonary:     Effort: Pulmonary effort is normal.     Breath sounds: Normal breath sounds.  Abdominal:     General: There is no distension.  Musculoskeletal: Normal range of motion.     Right lower leg: Edema present.     Left lower leg: No edema.  Skin:    General: Skin is warm  and dry.     Capillary Refill: Capillary refill takes less than 2 seconds.  Neurological:     Mental Status: She is alert and oriented to person, place, and time.  Psychiatric:        Mood and Affect: Mood normal.         Future Appointments  Date Time Provider [place]  04/23/2019 10:15 AM [facility] ECHO OP 2 [facility]  04/23/2019 11:40 AM Bensimhon, Shaune Pascal, MD [facility] None    BP 104/64 (BP Location: Left Arm, Patient Position: Sitting, Cuff Size: Normal)   Pulse 86   Resp 16   Wt 280 lb 11.2 oz (127.3 kg)   SpO2 98%   BMI 38.07 kg/m   Weight yesterday- 281 lb Last visit weight- 274 lb  Ms Caldwell was seen at home today and reported feeling well. She denied chest pain, SOB, headache, dizziness, orthopnea, cough or fever since our last visit. She stated she has been compliant with her medications over the past two weeks but her weight has increased. She stated she has not been able to get her colace for the past week and believes her weight will come down when she is able to take it again. Her medications were verified and she is filling her own pillbox. 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 I will follow up in two weeks.   Jacquiline Doe, EMT 03/13/19  ACTION: Home visit completed Next visit planned for 2 weeks

## 2019-03-26 ENCOUNTER — Telehealth (HOSPITAL_COMMUNITY): Payer: Self-pay

## 2019-03-26 NOTE — Telephone Encounter (Signed)
I called Anna Rowe to schedule an appointment. She stated she would be available tomorrow so we agreed to meet at 12:30

## 2019-03-27 ENCOUNTER — Other Ambulatory Visit (HOSPITAL_COMMUNITY): Payer: Self-pay

## 2019-03-27 NOTE — Progress Notes (Signed)
Paramedicine Encounter    Patient ID: Anna Rowe, female    DOB: [birth year], 43 y.o.   MRN: [id number]   Patient Care Team: [facility] [place] as PCP - General  Patient Active Problem List   Diagnosis Date Noted  . Chronic systolic (congestive) heart failure ([place]) 11/08/2018  . CHF (congestive heart failure) ([place]) 08/09/2018  . Acute diastolic CHF (congestive heart failure) ([place]) 08/08/2018  . DM2 (diabetes mellitus, type 2) ([place]) 08/08/2018  . History of DVT (deep vein thrombosis) 08/08/2018  . Unilateral primary osteoarthritis, left knee 08/24/2017  . History of total right knee replacement 08/24/2017  . Chronic bilateral low back pain 08/24/2017  . Status post total replacement of right hip 07/14/2017  . Status post total replacement of left hip 04/28/2017  . Unilateral primary osteoarthritis, left hip 03/09/2017  . Unilateral primary osteoarthritis, right hip 03/09/2017  . Primary osteoarthritis of right knee 04/04/2016    Current Outpatient Medications:  .  amiodarone (PACERONE) 200 MG tablet, Take 1 tablet (200 mg total) by mouth daily., Disp: 90 tablet, Rfl: 3 .  amitriptyline (ELAVIL) 50 MG tablet, Take 1 tablet (50 mg total) by mouth at bedtime., Disp: 30 tablet, Rfl: 2 .  carvedilol (COREG) 6.25 MG tablet, Take 1 tablet (6.25 mg total) by mouth 2 (two) times daily with a meal., Disp: 60 tablet, Rfl: 6 .  cholecalciferol (VITAMIN D3) 25 MCG (1000 UT) tablet, Take 3,000 Units by mouth daily., Disp: , Rfl:  .  cyclobenzaprine (FLEXERIL) 10 MG tablet, Take 10 mg by mouth 3 (three) times daily., Disp: , Rfl: 1 .  digoxin (LANOXIN) 0.125 MG tablet, TAKE 1 TABLET BY MOUTH DAILY, Disp: 30 tablet, Rfl: 5 .  docusate sodium (COLACE) 100 MG capsule, Take 100 mg by mouth 2 (two) times daily. , Disp: , Rfl: 6 .  Dulaglutide (TRULICITY) 9.24 QA/8.3MH SOPN, Inject 0.75 mg into the skin once a week., Disp: , Rfl:  .  gabapentin (NEURONTIN) 600 MG tablet, Take 600 mg  by mouth 4 (four) times daily. , Disp: , Rfl: 3 .  ipratropium-albuterol (DUONEB) 0.5-2.5 (3) MG/3ML SOLN, Take 3 mLs by nebulization 2 (two) times daily., Disp: 360 mL, Rfl: 0 .  levothyroxine (SYNTHROID) 25 MCG tablet, Take 25 mcg by mouth daily before breakfast., Disp: , Rfl:  .  lubiprostone (AMITIZA) 24 MCG capsule, Take 24 mcg by mouth 2 (two) times daily with a meal., Disp: , Rfl:  .  magnesium oxide (MAG-OX) 400 MG tablet, Take 400 mg by mouth daily., Disp: , Rfl:  .  Meloxicam 15 MG TBDP, Take 15 mg by mouth daily., Disp: , Rfl:  .  metFORMIN (GLUCOPHAGE-XR) 500 MG 24 hr tablet, Take 500 mg 2 (two) times daily by mouth. , Disp: , Rfl: 3 .  mometasone-formoterol (DULERA) 200-5 MCG/ACT AERO, Inhale 2 puffs into the lungs 2 (two) times daily., Disp: 2 Inhaler, Rfl: 0 .  Multiple Vitamin (MULTIVITAMIN WITH MINERALS) TABS tablet, Take 1 tablet by mouth daily., Disp: , Rfl:  .  oxyCODONE-acetaminophen (PERCOCET/ROXICET) 5-325 MG tablet, Take 1 tablet by mouth 4 (four) times daily., Disp: , Rfl:  .  OXYCONTIN 10 MG 12 hr tablet, Take 10 mg by mouth every 12 (twelve) hours., Disp: , Rfl: 0 .  potassium chloride SA (K-DUR) 20 MEQ tablet, Take 20 mEq by mouth daily., Disp: , Rfl:  .  rosuvastatin (CRESTOR) 10 MG tablet, Take 1 tablet (10 mg total) by mouth at bedtime., Disp: 30 tablet, Rfl:  5 .  sacubitril-valsartan (ENTRESTO) 97-103 MG, Take 1 tablet by mouth 2 (two) times daily., Disp: 180 tablet, Rfl: 3 .  spironolactone (ALDACTONE) 25 MG tablet, Take 0.5 tablets (12.5 mg total) by mouth at bedtime., Disp: 30 tablet, Rfl: 5 .  torsemide (DEMADEX) 20 MG tablet, Take 2 tablets (40 mg total) by mouth daily., Disp: 60 tablet, Rfl: 6 .  XARELTO 20 MG TABS tablet, TAKE 1 TABLET BY MOUTH EVERY DAY, Disp: 30 tablet, Rfl: 11 Allergies  Allergen Reactions  . Methocarbamol Anaphylaxis and Swelling  . Tramadol Anaphylaxis    Tolerates Dilaudid, Oxycontin 04/04/16  . Bee Venom Hives      Social  History   Socioeconomic History  . Marital status: Married    Spouse name: Not on file  . Number of children: Not on file  . Years of education: Not on file  . Highest education level: Not on file  Occupational History  . Not on file  Social Needs  . Financial resource strain: Not on file  . Food insecurity    Worry: Not on file    Inability: Not on file  . Transportation needs    Medical: Not on file    Non-medical: Not on file  Tobacco Use  . Smoking status: Former Smoker    Packs/day: 0.50    Years: 17.00    Pack years: 8.50    Types: Cigarettes    Quit date: 08/07/2018    Years since quitting: 0.6  . Smokeless tobacco: Never Used  . Tobacco comment: 3 Loose cigarettes in last week  Substance and Sexual Activity  . Alcohol use: No    Comment: seldom   . Drug use: No  . Sexual activity: Not Currently  Lifestyle  . Physical activity    Days per week: Not on file    Minutes per session: Not on file  . Stress: Not on file  Relationships  . Social Herbalist on phone: Not on file    Gets together: Not on file    Attends religious service: Not on file    Active member of club or organization: Not on file    Attends meetings of clubs or organizations: Not on file    Relationship status: Not on file  . Intimate partner violence    Fear of current or ex partner: Not on file    Emotionally abused: Not on file    Physically abused: Not on file    Forced sexual activity: Not on file  Other Topics Concern  . Not on file  Social History Narrative  . Not on file    Physical Exam Cardiovascular:     Rate and Rhythm: Normal rate and regular rhythm.  Pulmonary:     Effort: Pulmonary effort is normal.     Breath sounds: Normal breath sounds.  Abdominal:     General: Abdomen is flat. There is no distension.     Palpations: Abdomen is soft.     Tenderness: There is no abdominal tenderness.  Musculoskeletal: Normal range of motion.     Right lower leg: No  edema.     Left lower leg: No edema.  Skin:    General: Skin is warm and dry.     Capillary Refill: Capillary refill takes less than 2 seconds.  Neurological:     Mental Status: She is alert and oriented to person, place, and time.  Psychiatric:        Mood  and Affect: Mood normal.         Future Appointments  Date Time Provider [place]  04/23/2019 10:15 AM [facility] ECHO OP 2 [facility]  04/23/2019 11:40 AM Bensimhon, Shaune Pascal, MD [facility] None    BP (!) 102/58 (BP Location: Left Arm, Patient Position: Sitting, Cuff Size: Normal)   Pulse 76   Resp 16   Wt 279 lb 9.6 oz (126.8 kg)   SpO2 97%   BMI 37.92 kg/m   Weight yesterday- 281 lb Last visit weight- 280 lb  Anna Rowe was seen at home today and reported feeling fatigued. She stated she has been painting the inside of her house for the past two days and it has left her with little energy. She denied chest pain, OB, headache, dizziness, orthopnea, cough or fever since our last visit. Her main concern today is that she has been momiting every 2 days without explanation and only having a bowel movement every three days. She denied having abdominal pain and she did not exhibit distension. Her medications have not changed, specifically her stool softener. I suggested she contact her PCP at [facility] and she said she would consider it. I will follow up in two weeks unless she requires assistance before then.   Jacquiline Doe, EMT 03/27/19  ACTION: Home visit completed Next visit planned for 2 weeks

## 2019-04-08 ENCOUNTER — Other Ambulatory Visit (HOSPITAL_COMMUNITY): Payer: Self-pay

## 2019-04-10 ENCOUNTER — Telehealth (HOSPITAL_COMMUNITY): Payer: Self-pay

## 2019-04-10 ENCOUNTER — Other Ambulatory Visit (HOSPITAL_COMMUNITY): Payer: Self-pay

## 2019-04-10 MED ORDER — POTASSIUM CHLORIDE CRYS ER 20 MEQ PO TBCR: 20.0000 meq | EXTENDED_RELEASE_TABLET | Freq: Every day | ORAL | 3 refills | Status: DC

## 2019-04-12 NOTE — Telephone Encounter (Signed)
I called Anna Rowe to check in and see how she has been doing. She stated she has been well and able to get all her medications. She stated she was well and has been compliant with her medications. She stated there is nothing she needs at present and stated she would be able to meet with e next week but we decided to touch base then to set a time.

## 2019-04-17 ENCOUNTER — Other Ambulatory Visit (HOSPITAL_COMMUNITY): Payer: Self-pay

## 2019-04-17 NOTE — Progress Notes (Signed)
Paramedicine Encounter    Patient ID: Anna Rowe, female    DOB: 04/13/1976, 43 y.o.   MRN: [id number]   Patient Care Team: [facility] [place] as PCP - General  Patient Active Problem List   Diagnosis Date Noted  . Chronic systolic (congestive) heart failure ([place]) 11/08/2018  . CHF (congestive heart failure) ([place]) 08/09/2018  . Acute diastolic CHF (congestive heart failure) ([place]) 08/08/2018  . DM2 (diabetes mellitus, type 2) ([place]) 08/08/2018  . History of DVT (deep vein thrombosis) 08/08/2018  . Unilateral primary osteoarthritis, left knee 08/24/2017  . History of total right knee replacement 08/24/2017  . Chronic bilateral low back pain 08/24/2017  . Status post total replacement of right hip 07/14/2017  . Status post total replacement of left hip 04/28/2017  . Unilateral primary osteoarthritis, left hip 03/09/2017  . Unilateral primary osteoarthritis, right hip 03/09/2017  . Primary osteoarthritis of right knee 04/04/2016    Current Outpatient Medications:  .  amiodarone (PACERONE) 200 MG tablet, Take 1 tablet (200 mg total) by mouth daily., Disp: 90 tablet, Rfl: 3 .  amitriptyline (ELAVIL) 50 MG tablet, Take 1 tablet (50 mg total) by mouth at bedtime., Disp: 30 tablet, Rfl: 2 .  carvedilol (COREG) 6.25 MG tablet, Take 1 tablet (6.25 mg total) by mouth 2 (two) times daily with a meal., Disp: 60 tablet, Rfl: 6 .  cholecalciferol (VITAMIN D3) 25 MCG (1000 UT) tablet, Take 3,000 Units by mouth daily., Disp: , Rfl:  .  cyclobenzaprine (FLEXERIL) 10 MG tablet, Take 10 mg by mouth 3 (three) times daily., Disp: , Rfl: 1 .  digoxin (LANOXIN) 0.125 MG tablet, TAKE 1 TABLET BY MOUTH DAILY, Disp: 30 tablet, Rfl: 5 .  docusate sodium (COLACE) 100 MG capsule, Take 100 mg by mouth 2 (two) times daily. , Disp: , Rfl: 6 .  Dulaglutide (TRULICITY) 8.56 DJ/4.9FW SOPN, Inject 0.75 mg into the skin once a week., Disp: , Rfl:  .  gabapentin (NEURONTIN) 600 MG tablet, Take 600 mg  by mouth 4 (four) times daily. , Disp: , Rfl: 3 .  ipratropium-albuterol (DUONEB) 0.5-2.5 (3) MG/3ML SOLN, Take 3 mLs by nebulization 2 (two) times daily., Disp: 360 mL, Rfl: 0 .  levothyroxine (SYNTHROID) 25 MCG tablet, Take 25 mcg by mouth daily before breakfast., Disp: , Rfl:  .  lubiprostone (AMITIZA) 24 MCG capsule, Take 24 mcg by mouth 2 (two) times daily with a meal., Disp: , Rfl:  .  magnesium oxide (MAG-OX) 400 MG tablet, Take 400 mg by mouth daily., Disp: , Rfl:  .  Meloxicam 15 MG TBDP, Take 15 mg by mouth daily., Disp: , Rfl:  .  metFORMIN (GLUCOPHAGE-XR) 500 MG 24 hr tablet, Take 500 mg 2 (two) times daily by mouth. , Disp: , Rfl: 3 .  mometasone-formoterol (DULERA) 200-5 MCG/ACT AERO, Inhale 2 puffs into the lungs 2 (two) times daily., Disp: 2 Inhaler, Rfl: 0 .  Multiple Vitamin (MULTIVITAMIN WITH MINERALS) TABS tablet, Take 1 tablet by mouth daily., Disp: , Rfl:  .  oxyCODONE-acetaminophen (PERCOCET/ROXICET) 5-325 MG tablet, Take 1 tablet by mouth 4 (four) times daily., Disp: , Rfl:  .  OXYCONTIN 10 MG 12 hr tablet, Take 10 mg by mouth every 12 (twelve) hours., Disp: , Rfl: 0 .  potassium chloride SA (K-DUR) 20 MEQ tablet, Take 1 tablet (20 mEq total) by mouth daily., Disp: 90 tablet, Rfl: 3 .  rosuvastatin (CRESTOR) 10 MG tablet, Take 1 tablet (10 mg total) by mouth at bedtime.,  Disp: 30 tablet, Rfl: 5 .  sacubitril-valsartan (ENTRESTO) 97-103 MG, Take 1 tablet by mouth 2 (two) times daily., Disp: 180 tablet, Rfl: 3 .  spironolactone (ALDACTONE) 25 MG tablet, Take 0.5 tablets (12.5 mg total) by mouth at bedtime., Disp: 30 tablet, Rfl: 5 .  torsemide (DEMADEX) 20 MG tablet, Take 2 tablets (40 mg total) by mouth daily., Disp: 60 tablet, Rfl: 6 .  XARELTO 20 MG TABS tablet, TAKE 1 TABLET BY MOUTH EVERY DAY, Disp: 30 tablet, Rfl: 11 Allergies  Allergen Reactions  . Methocarbamol Anaphylaxis and Swelling  . Tramadol Anaphylaxis    Tolerates Dilaudid, Oxycontin 04/04/16  . Bee Venom  Hives      Social History   Socioeconomic History  . Marital status: Married    Spouse name: Not on file  . Number of children: Not on file  . Years of education: Not on file  . Highest education level: Not on file  Occupational History  . Not on file  Social Needs  . Financial resource strain: Not on file  . Food insecurity    Worry: Not on file    Inability: Not on file  . Transportation needs    Medical: Not on file    Non-medical: Not on file  Tobacco Use  . Smoking status: Former Smoker    Packs/day: 0.50    Years: 17.00    Pack years: 8.50    Types: Cigarettes    Quit date: 08/07/2018    Years since quitting: 0.6  . Smokeless tobacco: Never Used  . Tobacco comment: 3 Loose cigarettes in last week  Substance and Sexual Activity  . Alcohol use: No    Comment: seldom   . Drug use: No  . Sexual activity: Not Currently  Lifestyle  . Physical activity    Days per week: Not on file    Minutes per session: Not on file  . Stress: Not on file  Relationships  . Social Herbalist on phone: Not on file    Gets together: Not on file    Attends religious service: Not on file    Active member of club or organization: Not on file    Attends meetings of clubs or organizations: Not on file    Relationship status: Not on file  . Intimate partner violence    Fear of current or ex partner: Not on file    Emotionally abused: Not on file    Physically abused: Not on file    Forced sexual activity: Not on file  Other Topics Concern  . Not on file  Social History Narrative  . Not on file    Physical Exam Cardiovascular:     Rate and Rhythm: Normal rate and regular rhythm.     Pulses: Normal pulses.  Pulmonary:     Effort: Pulmonary effort is normal.     Breath sounds: Normal breath sounds.  Abdominal:     General: Abdomen is flat. There is no distension.  Musculoskeletal: Normal range of motion.     Right lower leg: No edema.     Left lower leg: No  edema.  Skin:    General: Skin is warm and dry.     Capillary Refill: Capillary refill takes less than 2 seconds.  Neurological:     Mental Status: She is alert and oriented to person, place, and time.  Psychiatric:        Mood and Affect: Mood normal.  Future Appointments  Date Time Provider [place]  04/23/2019 10:15 AM [facility] ECHO OP 2 [facility]  04/23/2019 11:40 AM Bensimhon, Shaune Pascal, MD [facility] None    BP 138/86 (BP Location: Left Arm, Patient Position: Sitting, Cuff Size: Normal)   Pulse 88   Resp 16   Wt 276 lb 6.4 oz (125.4 kg)   SpO2 98%   BMI 37.49 kg/m   Weight yesterday- 277 lb Last visit weight- 279 lb  Ms Gullikson was seen at home today and reported feeling well. She denied chest pain, SOB, headache, dizziness, orthopnea, fever or cough since our last visit. She stated she has been compliant with her medications over the past week and her weight has been stable. I believe she is ready for discharge from paramedicine but will plan to follow up at her next echo appointment to ensure nothing further is necessary. I explained this to Ms Tun and she was understanding and agreeable.  Jacquiline Doe, EMT 04/17/19  ACTION: Home visit completed Next visit planned for 1 month

## 2019-04-22 NOTE — Progress Notes (Signed)
Advanced Heart Failure Clinic Note  Date:  04/22/2019   ID:  Anna Rowe, DOB 1976-04-05, MRN [id number]  Location: Home  Provider location: [facility] Type of Visit: Established patient  PCP:  [place]  Cardiologist:  No primary care provider on file. Primary HF: [place]  Chief Complaint: Heart Failure follow-up   History of Present Illness:  Anna Rowe is a 43 y.o. female with a history of morbid obesity, DM2, COPD with ongoing tobacco use, OSA noncompliant with CPAP, chronic DVT(diagnosed 2018)on Xarelto, severe OA s/p R TKR and R/L hip replacements.    Of note, she states that her dad had HF and received an LVAD, after a similar "viral cardiomyopathy" type picture  Admitted to [facility] 08/08/18 - 08/15/2018 with acute HF.Marland Kitchen Echo showed newly reduced EF 15-20%. [facility] 12/27 showed marked volume overload and low output and minimal nonobstructive CAD. Diuresed on milrinone, and weaned off as tolerated as medications titrated.    Monitor 4/20 -> started on amio for PVC suppression  1. Predominant rhythm is sinus. Patient had a min HR of 71 bpm, max HR of 174 bpm, and avg HR of 98 bpm. 2. 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 Fifty episodes of brief NSVT up to 6 beats 4. Several patient-triggered events associated with either PVCs or normal sinus rhythm   Echo 12/26/18 EF 25-30%   She presents today for routine f/u. Follows with Paramedicine with Thedore Mins once a month. Feels good. No CP or SOB. No edema, orthopnea or PND. Taking it easy as she had a fall as her right knee gave out. She can get around the house ok. Uses a walker when needed. Taking meds as prescribed. No dizziness or syncope.   Echo today EF ~50% Personally reviewed  Cardiac studies:  Echo12/25/19:EF 15-20%, severe diffuse HK and septal dyskinesis, grade 2 DD, mod  MR, LA moderately dilated, RV moderately dilated with moderately reduced function, PA peak pressure 35 mm Hg  Cath 12/27 Findings: Ao = 99/75 (81) LV = 93/31 RA = 22 RV = 54/25 PA = 58/24 (41) PCW = 30 Fick cardiac output/index = 4.1/1.6 SVR = 1163 PVR = 2.6 FA sat = 89% PA sat = 42%, 44% Assessment: 1. 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 Severe NICM with LVEF ~ 15% 3. Marked volume overload with low output   Anna Rowe denies symptoms worrisome for COVID 19.   Past Medical History:  Diagnosis Date  . Arthritis   . CHF (congestive heart failure) ([place])   . COPD (chronic obstructive pulmonary disease) ([place])   . Diabetes mellitus ([place])    type 2   . DVT (deep venous thrombosis) ([place]) dx 10-2016   RLE   . HLD (hyperlipidemia)    on crestor  . Pneumonia    09/2016  . Sleep apnea    per patient ;been dx does not use CPAP does not tolerate   Past Surgical History:  Procedure Laterality Date  . ACHILLES TENDON REPAIR    . HERNIA REPAIR     x2; hiatal and umbilical   . IVC FILTER INSERTION N/A 04/25/2017   Procedure: IVC Filter Insertion;  Surgeon: Serafina Mitchell, MD;  Location: [place] [facility];  Service: Cardiovascular;  Laterality: N/A;  . IVC FILTER REMOVAL N/A 10/24/2017   Procedure: IVC FILTER REMOVAL;  Surgeon: Serafina Mitchell, MD;  Location:  [place] [facility];  Service: Cardiovascular;  Laterality: N/A;  . JOINT REPLACEMENT     right hip 07/14/17 Dr. Ninfa Linden  . KNEE ARTHROSCOPY Right 2015  . plantar    . RIGHT/LEFT HEART CATH AND CORONARY ANGIOGRAPHY N/A 08/10/2018   Procedure: RIGHT/LEFT HEART CATH AND CORONARY ANGIOGRAPHY;  Surgeon: Jolaine Artist, MD;  Location: [place] [facility];  Service: Cardiovascular;  Laterality: N/A;  . TOTAL HIP ARTHROPLASTY Left 04/28/2017   Procedure: LEFT TOTAL HIP ARTHROPLASTY ANTERIOR APPROACH;  Surgeon: Mcarthur Rossetti, MD;  Location: [facility];  Service: Orthopedics;  Laterality: Left;  .  TOTAL HIP ARTHROPLASTY Right 07/14/2017   Procedure: RIGHT TOTAL HIP ARTHROPLASTY ANTERIOR APPROACH;  Surgeon: Mcarthur Rossetti, MD;  Location: [facility];  Service: Orthopedics;  Laterality: Right;  . TOTAL KNEE ARTHROPLASTY Right 04/04/2016   Procedure: TOTAL KNEE ARTHROPLASTY;  Surgeon: Dorna Leitz, MD;  Location: [place];  Service: Orthopedics;  Laterality: Right;     Current Outpatient Medications  Medication Sig Dispense Refill  . amiodarone (PACERONE) 200 MG tablet Take 1 tablet (200 mg total) by mouth daily. 90 tablet 3  . amitriptyline (ELAVIL) 50 MG tablet Take 1 tablet (50 mg total) by mouth at bedtime. 30 tablet 2  . carvedilol (COREG) 6.25 MG tablet Take 1 tablet (6.25 mg total) by mouth 2 (two) times daily with a meal. 60 tablet 6  . cholecalciferol (VITAMIN D3) 25 MCG (1000 UT) tablet Take 3,000 Units by mouth daily.    . cyclobenzaprine (FLEXERIL) 10 MG tablet Take 10 mg by mouth 3 (three) times daily.  1  . digoxin (LANOXIN) 0.125 MG tablet TAKE 1 TABLET BY MOUTH DAILY 30 tablet 5  . docusate sodium (COLACE) 100 MG capsule Take 100 mg by mouth 2 (two) times daily.   6  . Dulaglutide (TRULICITY) 7.20 NO/7.0JG SOPN Inject 0.75 mg into the skin once a week.    . gabapentin (NEURONTIN) 600 MG tablet Take 600 mg by mouth 4 (four) times daily.   3  . ipratropium-albuterol (DUONEB) 0.5-2.5 (3) MG/3ML SOLN Take 3 mLs by nebulization 2 (two) times daily. 360 mL 0  . levothyroxine (SYNTHROID) 25 MCG tablet Take 25 mcg by mouth daily before breakfast.    . lubiprostone (AMITIZA) 24 MCG capsule Take 24 mcg by mouth 2 (two) times daily with a meal.    . magnesium oxide (MAG-OX) 400 MG tablet Take 400 mg by mouth daily.    . Meloxicam 15 MG TBDP Take 15 mg by mouth daily.    . metFORMIN (GLUCOPHAGE-XR) 500 MG 24 hr tablet Take 500 mg 2 (two) times daily by mouth.   3  . mometasone-formoterol (DULERA) 200-5 MCG/ACT AERO Inhale 2 puffs into the lungs 2 (two) times daily. 2 Inhaler 0  .  Multiple Vitamin (MULTIVITAMIN WITH MINERALS) TABS tablet Take 1 tablet by mouth daily.    Marland Kitchen oxyCODONE-acetaminophen (PERCOCET/ROXICET) 5-325 MG tablet Take 1 tablet by mouth 4 (four) times daily.    . OXYCONTIN 10 MG 12 hr tablet Take 10 mg by mouth every 12 (twelve) hours.  0  . potassium chloride SA (K-DUR) 20 MEQ tablet Take 1 tablet (20 mEq total) by mouth daily. 90 tablet 3  . rosuvastatin (CRESTOR) 10 MG tablet Take 1 tablet (10 mg total) by mouth at bedtime. 30 tablet 5  . sacubitril-valsartan (ENTRESTO) 97-103 MG Take 1 tablet by mouth 2 (two) times daily. 180 tablet 3  . spironolactone (ALDACTONE) 25 MG tablet Take  0.5 tablets (12.5 mg total) by mouth at bedtime. 30 tablet 5  . torsemide (DEMADEX) 20 MG tablet Take 2 tablets (40 mg total) by mouth daily. 60 tablet 6  . XARELTO 20 MG TABS tablet TAKE 1 TABLET BY MOUTH EVERY DAY 30 tablet 11   No current facility-administered medications for this encounter.     Allergies:   Methocarbamol, Tramadol, and Bee venom   Social History:  The patient  reports that she quit smoking about 8 months ago. Her smoking use included cigarettes. She has a 8.50 pack-year smoking history. She has never used smokeless tobacco. She reports that she does not drink alcohol or use drugs.   Family History:  The patient's family history includes Hypertension in her father, mother, and sister.   ROS:  Please see the history of present illness.   All other systems are personally reviewed and negative.   Vitals:   04/23/19 1108  BP: 118/75  Pulse: 86  SpO2: 96%  Weight: 127.6 kg (281 lb 6.4 oz)     Exam:   General:  Obese woman. Well appearing. No resp difficulty HEENT: normal Neck: supple. no JVD. Carotids 2+ bilat; no bruits. No lymphadenopathy or thryomegaly appreciated. Cor: PMI nondisplaced. Regular rate & rhythm. No rubs, gallops or murmurs. Lungs: clear Abdomen: obese soft, nontender, nondistended. No hepatosplenomegaly. No bruits or masses.  Good bowel sounds. Extremities: no cyanosis, clubbing, rash, edema Neuro: alert & orientedx3, cranial nerves grossly intact. moves all 4 extremities w/o difficulty. Affect pleasant    Recent Labs: 12/12/2018: ALT 19; B Natriuretic Peptide 177.8; BUN 13; Creatinine, Ser 1.29; Hemoglobin 12.8; Magnesium 1.7; Platelets 393; Potassium 4.5; Sodium 136; TSH 3.158  Personally reviewed   Wt Readings from Last 3 Encounters:  04/17/19 125.4 kg (276 lb 6.4 oz)  03/27/19 126.8 kg (279 lb 9.6 oz)  03/13/19 127.3 kg (280 lb 11.2 oz)      ASSESSMENT AND PLAN:  1. Chronic systolic HF due to NICM. Suspect possible PVC related vs viral or OSA  - Echo 12/25: EF 15-20%, severe diffuse HK and septal dyskinesis, grade 2 DD, mod MR, LA moderately dilated, RV moderately dilated with moderately reduced function, PA peak pressure 35 mm Hg. - Echo 5/20 EF 25-30% (improving with PVC suppression) - hold off on ICD eval for now - Echo today 50% - markedly improved with suppression of PVCs - [facility] 12/19 showed marked volume overload with low output, severe NICM with EF 15%, and minimal non-obstructive CAD.She has had a hysterectomy .  - No MRI with hip replacements and size - NYHA II. Volume status ok  - Stopped digoxin - Increase carvedilol to 9.375 twice a day.  - Continue Entresto 97/103 mg  twice a day.    - Continue spiro 12.5 mg daily. 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 Minimal nonobstructive CAD: - LHC 12/27: 30% Prox LAD - No s/s ischemia - Continue crestor. -No ASA with Xarelto use  3. DVT - Continue Xarelto 20 mg daily. No bleeding.   4. OSA: Continue CPAP nightly.[place] working on getting Bipap   5. COPD: - Continue chantix.   6. NSVT/PVCs  - Zio Patch placed having 10% PVC burden. Having at least 2 different morphologies.  - Started amio 200 mg twice a day on 12/05/2018.  - Has total PVC suppression on last visit with Paramedicine - Continue amio 200 daily. At  next visit can drop amio to 100 daily  - Check amio labs -  Given young age need to consider switching amio to another agent like flecainide to avoid risks of long-term toxicity. Suspect would be difficult candidate for ablation. Will d/w EP  7. DM2 - consider SGLT2i   Glori Bickers, MD  04/22/2019 5:48 PM  Advanced Heart Failure [facility] [address] Heart and [place] [address] [phone] (office) [phone] (fax)

## 2019-04-23 ENCOUNTER — Ambulatory Visit (HOSPITAL_BASED_OUTPATIENT_CLINIC_OR_DEPARTMENT_OTHER)
Admission: RE | Admit: 2019-04-23 | Discharge: 2019-04-23 | Disposition: A | Discharge status: Home / Self Care | Payer: 59 | Source: Ambulatory Visit | Attending: Internal Medicine | Admitting: Internal Medicine

## 2019-04-23 ENCOUNTER — Ambulatory Visit (HOSPITAL_COMMUNITY)
Admission: RE | Admit: 2019-04-23 | Discharge: 2019-04-23 | Disposition: A | Discharge status: Home / Self Care | Payer: 59 | Source: Ambulatory Visit | Attending: Internal Medicine | Admitting: Internal Medicine

## 2019-04-23 ENCOUNTER — Encounter (HOSPITAL_COMMUNITY): Payer: Self-pay | Admitting: Internal Medicine

## 2019-04-23 ENCOUNTER — Other Ambulatory Visit: Payer: Self-pay

## 2019-04-23 ENCOUNTER — Telehealth (HOSPITAL_COMMUNITY): Payer: Self-pay

## 2019-04-23 VITALS — BP 118/75 | HR 86 | Wt 281.4 lb

## 2019-04-23 DIAGNOSIS — G4733 Obstructive sleep apnea (adult) (pediatric): Secondary | ICD-10-CM

## 2019-04-23 DIAGNOSIS — Z7951 Long term (current) use of inhaled steroids: Secondary | ICD-10-CM | POA: Diagnosis not present

## 2019-04-23 DIAGNOSIS — I5022 Chronic systolic (congestive) heart failure: Secondary | ICD-10-CM

## 2019-04-23 DIAGNOSIS — I2510 Atherosclerotic heart disease of native coronary artery without angina pectoris: Secondary | ICD-10-CM | POA: Diagnosis not present

## 2019-04-23 DIAGNOSIS — Z7901 Long term (current) use of anticoagulants: Secondary | ICD-10-CM | POA: Insufficient documentation

## 2019-04-23 DIAGNOSIS — I428 Other cardiomyopathies: Secondary | ICD-10-CM | POA: Diagnosis not present

## 2019-04-23 DIAGNOSIS — Z79890 Hormone replacement therapy (postmenopausal): Secondary | ICD-10-CM | POA: Insufficient documentation

## 2019-04-23 DIAGNOSIS — F17210 Nicotine dependence, cigarettes, uncomplicated: Secondary | ICD-10-CM | POA: Diagnosis not present

## 2019-04-23 DIAGNOSIS — R5383 Other fatigue: Secondary | ICD-10-CM

## 2019-04-23 DIAGNOSIS — Z8249 Family history of ischemic heart disease and other diseases of the circulatory system: Secondary | ICD-10-CM | POA: Diagnosis not present

## 2019-04-23 DIAGNOSIS — E785 Hyperlipidemia, unspecified: Secondary | ICD-10-CM | POA: Insufficient documentation

## 2019-04-23 DIAGNOSIS — I493 Ventricular premature depolarization: Secondary | ICD-10-CM | POA: Diagnosis not present

## 2019-04-23 DIAGNOSIS — Z86718 Personal history of other venous thrombosis and embolism: Secondary | ICD-10-CM | POA: Diagnosis not present

## 2019-04-23 DIAGNOSIS — J449 Chronic obstructive pulmonary disease, unspecified: Secondary | ICD-10-CM | POA: Diagnosis not present

## 2019-04-23 DIAGNOSIS — Z79899 Other long term (current) drug therapy: Secondary | ICD-10-CM | POA: Diagnosis not present

## 2019-04-23 DIAGNOSIS — Z791 Long term (current) use of non-steroidal anti-inflammatories (NSAID): Secondary | ICD-10-CM | POA: Insufficient documentation

## 2019-04-23 DIAGNOSIS — E119 Type 2 diabetes mellitus without complications: Secondary | ICD-10-CM | POA: Insufficient documentation

## 2019-04-23 DIAGNOSIS — Z7984 Long term (current) use of oral hypoglycemic drugs: Secondary | ICD-10-CM | POA: Diagnosis not present

## 2019-04-23 LAB — COMPREHENSIVE METABOLIC PANEL
ALT: 21 U/L (ref 0–44)
AST: 17 U/L (ref 15–41)
Albumin: 3.5 g/dL (ref 3.5–5.0)
Alkaline Phosphatase: 59 U/L (ref 38–126)
Anion gap: 10 (ref 5–15)
BUN: 6 mg/dL (ref 6–20)
CO2: 24 mmol/L (ref 22–32)
Calcium: 9.1 mg/dL (ref 8.9–10.3)
Chloride: 107 mmol/L (ref 98–111)
Creatinine, Ser: 0.97 mg/dL (ref 0.44–1.00)
GFR calc Af Amer: >60 mL/min (ref >60)
GFR calc non Af Amer: >60 mL/min (ref >60)
Glucose, Bld: 166 mg/dL — ABNORMAL HIGH (ref 70–99)
Potassium: 4 mmol/L (ref 3.5–5.1)
Sodium: 141 mmol/L (ref 135–145)
Total Bilirubin: 0.3 mg/dL (ref 0.3–1.2)
Total Protein: 7 g/dL (ref 6.5–8.1)

## 2019-04-23 LAB — CBC
HCT: 38.3 % (ref 36.0–46.0)
Hemoglobin: 11.7 g/dL — ABNORMAL LOW (ref 12.0–15.0)
MCH: 22.9 pg — ABNORMAL LOW (ref 26.0–34.0)
MCHC: 30.5 g/dL (ref 30.0–36.0)
MCV: 75 fL — ABNORMAL LOW (ref 80.0–100.0)
Platelets: 394 10*3/uL (ref 150–400)
RBC: 5.11 MIL/uL (ref 3.87–5.11)
RDW: 17.7 % — ABNORMAL HIGH (ref 11.5–15.5)
WBC: 11 10*3/uL — ABNORMAL HIGH (ref 4.0–10.5)
nRBC: 0 % (ref 0.0–0.2)

## 2019-04-23 LAB — TSH: TSH: 4.041 u[IU]/mL (ref 0.350–4.500)

## 2019-04-23 LAB — T4, FREE: Free T4: 0.98 ng/dL (ref 0.61–1.12)

## 2019-04-23 MED ORDER — CARVEDILOL 6.25 MG PO TABS: 9.3750 mg | ORAL_TABLET | Freq: Two times a day (BID) | ORAL | 6 refills | Status: DC

## 2019-04-23 NOTE — Progress Notes (Signed)
  Echocardiogram 2D Echocardiogram has been performed.  Anna Rowe G [name] 04/23/2019, 11:09 AM

## 2019-04-23 NOTE — Telephone Encounter (Signed)
I called Anna Rowe to go over her medication changes from her clinic visit today. She did not answer so I left a message with the information and requested she call me if she has any questions.

## 2019-04-23 NOTE — Patient Instructions (Signed)
Stop Digoxin  Increase Carvedilol to 9.375 mg (1 & 1/2 tabs) Twice daily   Labs done today  Please call our office in December to schedule your 4 month appointment due in January 2021  At the [facility], you and your health needs are our priority. As part of our continuing mission to provide you with exceptional heart care, we have created designated Provider Care Teams. These Care Teams include your primary Cardiologist (physician) and Advanced Practice Providers (APPs- Physician Assistants and Nurse Practitioners) who all work together to provide you with the care you need, when you need it.   You may see any of the following providers on your designated Care Team at your next follow up: Marland Kitchen Dr Glori Bickers . Dr Loralie Champagne . Darrick Grinder, NP   Please be sure to bring in all your medications bottles to every appointment.

## 2019-04-24 ENCOUNTER — Telehealth (HOSPITAL_COMMUNITY): Payer: Self-pay

## 2019-04-24 DIAGNOSIS — I5022 Chronic systolic (congestive) heart failure: Secondary | ICD-10-CM

## 2019-04-24 LAB — T3, FREE: T3, Free: 2.7 pg/mL (ref 2.0–4.4)

## 2019-04-24 NOTE — Telephone Encounter (Signed)
-----   Message from Jolaine Artist, MD sent at 04/23/2019  1:05 PM EDT ----- Has microcytic anemia. Please check iron stores

## 2019-04-24 NOTE — Telephone Encounter (Signed)
Called pt and let her know about lab work. Pt verbalized understanding and is agreeable to lab appointment.

## 2019-04-29 ENCOUNTER — Ambulatory Visit (HOSPITAL_COMMUNITY)
Admission: RE | Admit: 2019-04-29 | Discharge: 2019-04-29 | Disposition: A | Discharge status: Home / Self Care | Payer: 59 | Source: Ambulatory Visit | Attending: Internal Medicine | Admitting: Internal Medicine

## 2019-04-29 ENCOUNTER — Other Ambulatory Visit: Payer: Self-pay

## 2019-04-29 DIAGNOSIS — I5022 Chronic systolic (congestive) heart failure: Secondary | ICD-10-CM | POA: Diagnosis present

## 2019-04-29 LAB — IRON AND TIBC
Iron: 109 ug/dL (ref 28–170)
Saturation Ratios: 31 % (ref 10.4–31.8)
TIBC: 356 ug/dL (ref 250–450)
UIBC: 247 ug/dL

## 2019-04-29 LAB — FERRITIN: Ferritin: 230 ng/mL (ref 11–307)

## 2019-05-01 ENCOUNTER — Other Ambulatory Visit (HOSPITAL_COMMUNITY): Payer: Self-pay

## 2019-05-01 ENCOUNTER — Other Ambulatory Visit (HOSPITAL_COMMUNITY): Payer: Self-pay | Admitting: Internal Medicine

## 2019-05-01 NOTE — Progress Notes (Signed)
Paramedicine Encounter    Patient ID: Anna Rowe, female    DOB: [birth year], 43 y.o.   MRN: [id number]   Patient Care Team: [facility] [place] as PCP - General  Patient Active Problem List   Diagnosis Date Noted  . Chronic systolic (congestive) heart failure ([place]) 11/08/2018  . CHF (congestive heart failure) ([place]) 08/09/2018  . Acute diastolic CHF (congestive heart failure) ([place]) 08/08/2018  . DM2 (diabetes mellitus, type 2) ([place]) 08/08/2018  . History of DVT (deep vein thrombosis) 08/08/2018  . Unilateral primary osteoarthritis, left knee 08/24/2017  . History of total right knee replacement 08/24/2017  . Chronic bilateral low back pain 08/24/2017  . Status post total replacement of right hip 07/14/2017  . Status post total replacement of left hip 04/28/2017  . Unilateral primary osteoarthritis, left hip 03/09/2017  . Unilateral primary osteoarthritis, right hip 03/09/2017  . Primary osteoarthritis of right knee 04/04/2016    Current Outpatient Medications:  .  amiodarone (PACERONE) 200 MG tablet, Take 1 tablet (200 mg total) by mouth daily., Disp: 90 tablet, Rfl: 3 .  AMITIZA 24 MCG capsule, TAKE 1 CAPSULE (24 MCG TOTAL) BY MOUTH 2 TIMES DAILY WITH MEALS FOR 30 DAYS., Disp: 60 capsule, Rfl: 5 .  amitriptyline (ELAVIL) 50 MG tablet, Take 1 tablet (50 mg total) by mouth at bedtime., Disp: 30 tablet, Rfl: 2 .  carvedilol (COREG) 6.25 MG tablet, Take 1.5 tablets (9.375 mg total) by mouth 2 (two) times daily with a meal., Disp: 90 tablet, Rfl: 6 .  cholecalciferol (VITAMIN D3) 25 MCG (1000 UT) tablet, Take 3,000 Units by mouth daily., Disp: , Rfl:  .  cyclobenzaprine (FLEXERIL) 10 MG tablet, Take 10 mg by mouth 3 (three) times daily., Disp: , Rfl: 1 .  docusate sodium (COLACE) 100 MG capsule, Take 100 mg by mouth 2 (two) times daily. , Disp: , Rfl: 6 .  Dulaglutide (TRULICITY) 4.88 QB/1.6XI SOPN, Inject 0.75 mg into the skin once a week., Disp: , Rfl:  .   gabapentin (NEURONTIN) 600 MG tablet, Take 600 mg by mouth 4 (four) times daily. , Disp: , Rfl: 3 .  ipratropium-albuterol (DUONEB) 0.5-2.5 (3) MG/3ML SOLN, Take 3 mLs by nebulization 2 (two) times daily., Disp: 360 mL, Rfl: 0 .  levothyroxine (SYNTHROID) 25 MCG tablet, Take 25 mcg by mouth daily before breakfast., Disp: , Rfl:  .  magnesium oxide (MAG-OX) 400 MG tablet, Take 400 mg by mouth daily., Disp: , Rfl:  .  Meloxicam 15 MG TBDP, Take 15 mg by mouth daily., Disp: , Rfl:  .  metFORMIN (GLUCOPHAGE-XR) 500 MG 24 hr tablet, Take 500 mg 2 (two) times daily by mouth. , Disp: , Rfl: 3 .  mometasone-formoterol (DULERA) 200-5 MCG/ACT AERO, Inhale 2 puffs into the lungs 2 (two) times daily., Disp: 2 Inhaler, Rfl: 0 .  Multiple Vitamin (MULTIVITAMIN WITH MINERALS) TABS tablet, Take 1 tablet by mouth daily., Disp: , Rfl:  .  oxyCODONE-acetaminophen (PERCOCET/ROXICET) 5-325 MG tablet, Take 1 tablet by mouth 4 (four) times daily., Disp: , Rfl:  .  OXYCONTIN 10 MG 12 hr tablet, Take 10 mg by mouth every 12 (twelve) hours., Disp: , Rfl: 0 .  potassium chloride SA (K-DUR) 20 MEQ tablet, Take 1 tablet (20 mEq total) by mouth daily., Disp: 90 tablet, Rfl: 3 .  rosuvastatin (CRESTOR) 10 MG tablet, Take 1 tablet (10 mg total) by mouth at bedtime., Disp: 30 tablet, Rfl: 5 .  sacubitril-valsartan (ENTRESTO) 97-103 MG, Take 1 tablet  by mouth 2 (two) times daily., Disp: 180 tablet, Rfl: 3 .  spironolactone (ALDACTONE) 25 MG tablet, Take 0.5 tablets (12.5 mg total) by mouth at bedtime., Disp: 30 tablet, Rfl: 5 .  torsemide (DEMADEX) 20 MG tablet, Take 2 tablets (40 mg total) by mouth daily., Disp: 60 tablet, Rfl: 6 .  XARELTO 20 MG TABS tablet, TAKE 1 TABLET BY MOUTH EVERY DAY, Disp: 30 tablet, Rfl: 11 Allergies  Allergen Reactions  . Methocarbamol Anaphylaxis and Swelling  . Tramadol Anaphylaxis    Tolerates Dilaudid, Oxycontin 04/04/16  . Bee Venom Hives      Social History   Socioeconomic History  .  Marital status: Married    Spouse name: Not on file  . Number of children: Not on file  . Years of education: Not on file  . Highest education level: Not on file  Occupational History  . Not on file  Social Needs  . Financial resource strain: Not on file  . Food insecurity    Worry: Not on file    Inability: Not on file  . Transportation needs    Medical: Not on file    Non-medical: Not on file  Tobacco Use  . Smoking status: Former Smoker    Packs/day: 0.50    Years: 17.00    Pack years: 8.50    Types: Cigarettes    Quit date: 08/07/2018    Years since quitting: 0.7  . Smokeless tobacco: Never Used  . Tobacco comment: 3 Loose cigarettes in last week  Substance and Sexual Activity  . Alcohol use: No    Comment: seldom   . Drug use: No  . Sexual activity: Not Currently  Lifestyle  . Physical activity    Days per week: Not on file    Minutes per session: Not on file  . Stress: Not on file  Relationships  . Social Herbalist on phone: Not on file    Gets together: Not on file    Attends religious service: Not on file    Active member of club or organization: Not on file    Attends meetings of clubs or organizations: Not on file    Relationship status: Not on file  . Intimate partner violence    Fear of current or ex partner: Not on file    Emotionally abused: Not on file    Physically abused: Not on file    Forced sexual activity: Not on file  Other Topics Concern  . Not on file  Social History Narrative  . Not on file    Physical Exam Cardiovascular:     Rate and Rhythm: Normal rate and regular rhythm.     Pulses: Normal pulses.  Pulmonary:     Effort: Pulmonary effort is normal.     Breath sounds: Normal breath sounds.  Abdominal:     General: There is no distension.  Musculoskeletal: Normal range of motion.     Right lower leg: No edema.     Left lower leg: No edema.  Skin:    General: Skin is warm and dry.     Capillary Refill: Capillary  refill takes less than 2 seconds.  Neurological:     Mental Status: She is alert and oriented to person, place, and time.  Psychiatric:        Mood and Affect: Mood normal.         No future appointments.  BP 130/88 (BP Location: Left Arm, Patient Position:  Sitting, Cuff Size: Normal)   Pulse 82   Resp 16   Wt 276 lb 12.8 oz (125.6 kg)   SpO2 96%   BMI 37.54 kg/m   Weight yesterday- 276.4 lb Last visit weight- 281 lb  Anna Rowe was seen at home today and reported feeling generally well. She denied chest pain, SOB, headache, dizziness, orthopnea, fever or cough since our last visit. Her only complaint is generalized fatigue which she said she was told was possibly due to anemia. I reviewed her chart and found a note which advised she has microcytic anemia. I contacted the clinic to see if there are any changes that need to be made as a result but there were no changes at that time. I relayed this to Anna Rowe and she was understanding. Her medications were verified and nothing has changes since her clinic visit last week. She continues to exhibit a thorough understanding of her medical diagnosis and manages her medications without issue. This week we discussed discharge from paramedicine and she was agreeable. I advised that should an issue arise she should contact the clinic moving forward and if necessary they can re-refer her to the program. She was understanding and agreeable. I will advised Tammy Sours, LCSW, that Anna Jeancharles can be removed from the list.   Jacquiline Doe, EMT 05/01/19  ACTION: Home visit completed

## 2019-09-22 ENCOUNTER — Other Ambulatory Visit (HOSPITAL_COMMUNITY): Payer: Self-pay | Admitting: Internal Medicine

## 2019-09-26 ENCOUNTER — Other Ambulatory Visit (HOSPITAL_COMMUNITY): Payer: Self-pay

## 2019-09-26 MED ORDER — SPIRONOLACTONE 25 MG PO TABS: 12.5000 mg | ORAL_TABLET | Freq: Every day | ORAL | 3 refills | Status: DC

## 2019-10-04 ENCOUNTER — Telehealth (HOSPITAL_COMMUNITY): Payer: Self-pay | Admitting: Pharmacist

## 2019-10-04 NOTE — Telephone Encounter (Signed)
Patient Advocate Encounter   Received notification from [employer] that prior authorization for Anna Rowe is required.   PA submitted on CoverMyMeds Key [id number] Status is pending   Will continue to follow.  Audry Riles, PharmD, BCPS, BCCP, CPP Heart Failure Clinic Pharmacist [phone]

## 2019-10-07 NOTE — Telephone Encounter (Signed)
Advanced Heart Failure Patient Advocate Encounter  Prior Authorization for Delene Loll has been approved.    Effective dates: 10/04/19 through 10/03/20  Audry Riles, PharmD, BCPS, BCCP, CPP Heart Failure Clinic Pharmacist [phone]

## 2019-11-07 ENCOUNTER — Other Ambulatory Visit (HOSPITAL_COMMUNITY): Payer: Self-pay | Admitting: Internal Medicine

## 2019-11-10 ENCOUNTER — Other Ambulatory Visit (HOSPITAL_COMMUNITY): Payer: Self-pay | Admitting: Internal Medicine

## 2019-11-13 ENCOUNTER — Other Ambulatory Visit (HOSPITAL_COMMUNITY): Payer: Self-pay | Admitting: Internal Medicine

## 2019-12-04 ENCOUNTER — Other Ambulatory Visit (HOSPITAL_COMMUNITY): Payer: Self-pay | Admitting: Internal Medicine

## 2020-02-01 ENCOUNTER — Other Ambulatory Visit (HOSPITAL_COMMUNITY): Payer: Self-pay | Admitting: Internal Medicine

## 2020-02-23 ENCOUNTER — Other Ambulatory Visit (HOSPITAL_COMMUNITY): Payer: Self-pay | Admitting: Internal Medicine

## 2020-02-27 ENCOUNTER — Other Ambulatory Visit (HOSPITAL_COMMUNITY): Payer: Self-pay | Admitting: Internal Medicine

## 2020-03-02 ENCOUNTER — Telehealth (HOSPITAL_COMMUNITY): Payer: Self-pay | Admitting: Vascular Surgery

## 2020-03-02 NOTE — Telephone Encounter (Signed)
Pt need refill on all heart meds, pt is scheduled to see DB 9/30. Amidarone, carvedilol, klor- con,Entresto, Spirolactone, torsemide.. please advise

## 2020-03-03 NOTE — Telephone Encounter (Signed)
Refills have already been sent in

## 2020-03-06 NOTE — Telephone Encounter (Signed)
Encounter open error

## 2020-03-25 ENCOUNTER — Other Ambulatory Visit (HOSPITAL_COMMUNITY): Payer: Self-pay | Admitting: Internal Medicine

## 2020-04-21 ENCOUNTER — Other Ambulatory Visit (HOSPITAL_COMMUNITY): Payer: Self-pay | Admitting: Internal Medicine

## 2020-05-14 ENCOUNTER — Encounter (HOSPITAL_COMMUNITY): Payer: Self-pay | Admitting: Internal Medicine

## 2020-05-14 ENCOUNTER — Other Ambulatory Visit: Payer: Self-pay

## 2020-05-14 ENCOUNTER — Ambulatory Visit (HOSPITAL_COMMUNITY)
Admission: RE | Admit: 2020-05-14 | Discharge: 2020-05-14 | Disposition: A | Discharge status: Home / Self Care | Payer: 59 | Source: Ambulatory Visit | Attending: Internal Medicine | Admitting: Internal Medicine

## 2020-05-14 VITALS — BP 142/86 | HR 82 | Ht 72.0 in | Wt 285.2 lb

## 2020-05-14 DIAGNOSIS — Z791 Long term (current) use of non-steroidal anti-inflammatories (NSAID): Secondary | ICD-10-CM | POA: Insufficient documentation

## 2020-05-14 DIAGNOSIS — G4733 Obstructive sleep apnea (adult) (pediatric): Secondary | ICD-10-CM | POA: Diagnosis not present

## 2020-05-14 DIAGNOSIS — Z87891 Personal history of nicotine dependence: Secondary | ICD-10-CM | POA: Diagnosis not present

## 2020-05-14 DIAGNOSIS — Z9119 Patient's noncompliance with other medical treatment and regimen: Secondary | ICD-10-CM | POA: Insufficient documentation

## 2020-05-14 DIAGNOSIS — E119 Type 2 diabetes mellitus without complications: Secondary | ICD-10-CM | POA: Insufficient documentation

## 2020-05-14 DIAGNOSIS — I428 Other cardiomyopathies: Secondary | ICD-10-CM | POA: Insufficient documentation

## 2020-05-14 DIAGNOSIS — I472 Ventricular tachycardia: Secondary | ICD-10-CM | POA: Insufficient documentation

## 2020-05-14 DIAGNOSIS — Z79899 Other long term (current) drug therapy: Secondary | ICD-10-CM | POA: Insufficient documentation

## 2020-05-14 DIAGNOSIS — J449 Chronic obstructive pulmonary disease, unspecified: Secondary | ICD-10-CM | POA: Diagnosis not present

## 2020-05-14 DIAGNOSIS — Z96643 Presence of artificial hip joint, bilateral: Secondary | ICD-10-CM | POA: Insufficient documentation

## 2020-05-14 DIAGNOSIS — I2510 Atherosclerotic heart disease of native coronary artery without angina pectoris: Secondary | ICD-10-CM | POA: Insufficient documentation

## 2020-05-14 DIAGNOSIS — I493 Ventricular premature depolarization: Secondary | ICD-10-CM

## 2020-05-14 DIAGNOSIS — I5022 Chronic systolic (congestive) heart failure: Secondary | ICD-10-CM | POA: Diagnosis present

## 2020-05-14 DIAGNOSIS — Z7901 Long term (current) use of anticoagulants: Secondary | ICD-10-CM | POA: Insufficient documentation

## 2020-05-14 DIAGNOSIS — Z86718 Personal history of other venous thrombosis and embolism: Secondary | ICD-10-CM | POA: Insufficient documentation

## 2020-05-14 DIAGNOSIS — E785 Hyperlipidemia, unspecified: Secondary | ICD-10-CM | POA: Insufficient documentation

## 2020-05-14 DIAGNOSIS — Z8249 Family history of ischemic heart disease and other diseases of the circulatory system: Secondary | ICD-10-CM | POA: Diagnosis not present

## 2020-05-14 DIAGNOSIS — Z7984 Long term (current) use of oral hypoglycemic drugs: Secondary | ICD-10-CM | POA: Diagnosis not present

## 2020-05-14 DIAGNOSIS — M25561 Pain in right knee: Secondary | ICD-10-CM | POA: Insufficient documentation

## 2020-05-14 LAB — COMPREHENSIVE METABOLIC PANEL
ALT: 22 U/L (ref 0–44)
AST: 15 U/L (ref 15–41)
Albumin: 3.6 g/dL (ref 3.5–5.0)
Alkaline Phosphatase: 77 U/L (ref 38–126)
Anion gap: 11 (ref 5–15)
BUN: 12 mg/dL (ref 6–20)
CO2: 21 mmol/L — ABNORMAL LOW (ref 22–32)
Calcium: 9.8 mg/dL (ref 8.9–10.3)
Chloride: 102 mmol/L (ref 98–111)
Creatinine, Ser: 1.3 mg/dL — ABNORMAL HIGH (ref 0.44–1.00)
GFR calc Af Amer: 58 mL/min — ABNORMAL LOW (ref >60)
GFR calc non Af Amer: 50 mL/min — ABNORMAL LOW (ref >60)
Glucose, Bld: 214 mg/dL — ABNORMAL HIGH (ref 70–99)
Potassium: 5.1 mmol/L (ref 3.5–5.1)
Sodium: 134 mmol/L — ABNORMAL LOW (ref 135–145)
Total Bilirubin: 0.7 mg/dL (ref 0.3–1.2)
Total Protein: 7.7 g/dL (ref 6.5–8.1)

## 2020-05-14 LAB — CBC
HCT: 40.3 % (ref 36.0–46.0)
Hemoglobin: 12.5 g/dL (ref 12.0–15.0)
MCH: 23.3 pg — ABNORMAL LOW (ref 26.0–34.0)
MCHC: 31 g/dL (ref 30.0–36.0)
MCV: 75.2 fL — ABNORMAL LOW (ref 80.0–100.0)
Platelets: 429 10*3/uL — ABNORMAL HIGH (ref 150–400)
RBC: 5.36 MIL/uL — ABNORMAL HIGH (ref 3.87–5.11)
RDW: 16.2 % — ABNORMAL HIGH (ref 11.5–15.5)
WBC: 10.1 10*3/uL (ref 4.0–10.5)
nRBC: 0 % (ref 0.0–0.2)

## 2020-05-14 LAB — BRAIN NATRIURETIC PEPTIDE: B Natriuretic Peptide: 19.4 pg/mL (ref 0.0–100.0)

## 2020-05-14 MED ORDER — ENTRESTO 97-103 MG PO TABS: 1.0000 | ORAL_TABLET | Freq: Two times a day (BID) | ORAL | 2 refills | Status: DC

## 2020-05-14 MED ORDER — CARVEDILOL 12.5 MG PO TABS: 12.5000 mg | ORAL_TABLET | Freq: Two times a day (BID) | ORAL | 3 refills | Status: DC

## 2020-05-14 MED ORDER — SPIRONOLACTONE 25 MG PO TABS: 12.5000 mg | ORAL_TABLET | Freq: Every day | ORAL | 3 refills | Status: DC

## 2020-05-14 MED ORDER — DAPAGLIFLOZIN PROPANEDIOL 10 MG PO TABS: 10.0000 mg | ORAL_TABLET | Freq: Every day | ORAL | 6 refills | Status: DC

## 2020-05-14 NOTE — Progress Notes (Signed)
Zio patch placed onto patient.  All instructions and information reviewed with patient, they verbalize understanding with no questions. 

## 2020-05-14 NOTE — Addendum Note (Signed)
Encounter addended by: Scarlette Calico, RN on: 05/14/2020 11:10 AM  Actions taken: Medication long-term status modified, Diagnosis association updated, Order list changed, Clinical Note Signed, Charge Capture section accepted

## 2020-05-14 NOTE — Patient Instructions (Signed)
Stop Torsemide  Increase Carvedilol to 12.5 mg Twice daily   Start Farxiga 10 mg Daily   Your provider has prescribed Wilder Glade for you. Please be aware the most common side effect of this medication is urinary tract infections and yeast infections. Please practice good hygiene and keep this area clean and dry to help prevent this. If you do begin to have symptoms of these infections, such as difficulty urinating or painful urination,  please let us know.  Labs done today, your results will be available in MyChart, we will contact you for abnormal readings.  Your provider has recommended that  you wear a Zio Patch for 14 days.  This monitor will record your heart rhythm for our review.  IF you have any symptoms while wearing the monitor please press the button.  If you have any issues with the patch or you notice a red or orange light on it please call the company at [phone].  Once you remove the patch please mail it back to the company as soon as possible so we can get the results.  Your physician has requested that you have an echocardiogram. Echocardiography is a painless test that uses sound waves to create images of your heart. It provides your doctor with information about the size and shape of your heart and how well your heart's chambers and valves are working. This procedure takes approximately one hour. There are no restrictions for this procedure.  Please call our office in February 2022 to schedule your next appointment  If you have any questions or concerns before your next appointment please send [place] a message through [place] or call our office at [phone].    TO LEAVE A MESSAGE FOR THE NURSE SELECT OPTION 2, PLEASE LEAVE A MESSAGE INCLUDING: . YOUR NAME . DATE OF BIRTH . CALL BACK NUMBER . REASON FOR CALL**this is important as we prioritize the call backs  [place] AS LONG AS YOU CALL BEFORE 4:00 PM  At the [facility], you and your health needs are our priority. As part of our continuing mission to provide you with exceptional heart care, we have created designated Provider Care Teams. These Care Teams include your primary Cardiologist (physician) and Advanced Practice Providers (APPs- Physician Assistants and Nurse Practitioners) who all work together to provide you with the care you need, when you need it.   You may see any of the following providers on your designated Care Team at your next follow up: Marland Kitchen Dr Glori Bickers . Dr Loralie Champagne . Darrick Grinder, NP . Lyda Jester, PA . Audry Riles, PharmD   Please be sure to bring in all your medications bottles to every appointment.

## 2020-05-14 NOTE — Progress Notes (Signed)
Advanced Heart Failure Clinic Note  Date:  05/14/2020   ID:  Anna Rowe, DOB 11-Jun-1976, MRN [id number]  Location: Home  Provider location: [facility] Type of Visit: Established patient  PCP:  [place]  Cardiologist:  No primary care provider on file. Primary HF: Kasandra Fehr  Chief Complaint: Heart Failure follow-up   History of Present Illness:  Anna Rowe is a 44 y.o. female with a history of morbid obesity, DM2, COPD with ongoing tobacco use, OSA noncompliant with CPAP, chronic DVT(diagnosed 2018)on Xarelto, severe OA s/p R TKR and R/L hip replacements.    Of note, she states that her dad had HF and received an LVAD, after a similar "viral cardiomyopathy" type picture  Admitted to [facility] 08/08/18 - 08/15/2018 with acute HF.Marland Kitchen Echo showed newly reduced EF 15-20%. [facility] 12/27 showed marked volume overload and low output and minimal nonobstructive CAD. Diuresed on milrinone, and weaned off as tolerated as medications titrated.    Monitor 4/20 -> started on amio for PVC suppression  1. Predominant rhythm is sinus. Patient had a min HR of 71 bpm, max HR of 174 bpm, and avg HR of 98 bpm. 2. 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 Fifty episodes of brief NSVT up to 6 beats 4. Several patient-triggered events associated with either PVCs or normal sinus rhythm   Echo 12/26/18 EF 25-30%   She presents today for routine f/u. We have not seen her in about a year. She has been trying to avoid going out with COVID. Says she doesn't do much. Denies edema, SOB, orthopnea or PND. Says most of her problems with mobility revolve around her right knee pain (she is s/p TKR) but still having problems. Also s/p bilateral hip replacements. Endocrinologist asking about [place].   Echo 9/20 EF EF 40-45% (I though closer to ~50%)   Cardiac  studies:  Echo12/25/19:EF 15-20%, severe diffuse HK and septal dyskinesis, grade 2 DD, mod MR, LA moderately dilated, RV moderately dilated with moderately reduced function, PA peak pressure 35 mm Hg  Cath 12/27 Findings: Ao = 99/75 (81) LV = 93/31 RA = 22 RV = 54/25 PA = 58/24 (41) PCW = 30 Fick cardiac output/index = 4.1/1.6 SVR = 1163 PVR = 2.6 FA sat = 89% PA sat = 42%, 44% Assessment: 1. 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 Severe NICM with LVEF ~ 15% 3. Marked volume overload with low output   Anna Rowe denies symptoms worrisome for COVID 19.   Past Medical History:  Diagnosis Date  . Arthritis   . CHF (congestive heart failure) ([place])   . COPD (chronic obstructive pulmonary disease) ([place])   . Diabetes mellitus ([place])    type 2   . DVT (deep venous thrombosis) ([place]) dx 10-2016   RLE   . HLD (hyperlipidemia)    on crestor  . Pneumonia    09/2016  . Sleep apnea    per patient ;been dx does not use CPAP does not tolerate   Past Surgical History:  Procedure Laterality Date  . ACHILLES TENDON REPAIR    . HERNIA REPAIR     x2; hiatal and umbilical   . IVC FILTER INSERTION N/A 04/25/2017   Procedure: IVC Filter Insertion;  Surgeon: Serafina Mitchell, MD;  Location: [facility];  Service: Cardiovascular;  Laterality: N/A;  . IVC FILTER REMOVAL N/A 10/24/2017  Procedure: IVC FILTER REMOVAL;  Surgeon: Serafina Mitchell, MD;  Location: [facility];  Service: Cardiovascular;  Laterality: N/A;  . JOINT REPLACEMENT     right hip 07/14/17 Dr. Ninfa Linden  . KNEE ARTHROSCOPY Right 2015  . plantar    . RIGHT/LEFT HEART CATH AND CORONARY ANGIOGRAPHY N/A 08/10/2018   Procedure: RIGHT/LEFT HEART CATH AND CORONARY ANGIOGRAPHY;  Surgeon: Jolaine Artist, MD;  Location: [facility];  Service: Cardiovascular;  Laterality: N/A;  . TOTAL HIP ARTHROPLASTY Left 04/28/2017   Procedure: LEFT TOTAL HIP ARTHROPLASTY ANTERIOR APPROACH;  Surgeon:  Mcarthur Rossetti, MD;  Location: [facility];  Service: Orthopedics;  Laterality: Left;  . TOTAL HIP ARTHROPLASTY Right 07/14/2017   Procedure: RIGHT TOTAL HIP ARTHROPLASTY ANTERIOR APPROACH;  Surgeon: Mcarthur Rossetti, MD;  [facility]: [facility];  Service: Orthopedics;  Laterality: Right;  . TOTAL KNEE ARTHROPLASTY Right 04/04/2016   Procedure: TOTAL KNEE ARTHROPLASTY;  Surgeon: Dorna Leitz, MD;  Location: [facility];  Service: Orthopedics;  Laterality: Right;     Current Outpatient Medications  Medication Sig Dispense Refill  . amiodarone (PACERONE) 200 MG tablet Take 1 tablet (200 mg total) by mouth daily. Needs appt 90 tablet 1  . AMITIZA 24 MCG capsule TAKE 1 CAPSULE (24 MCG TOTAL) BY MOUTH 2 TIMES DAILY WITH MEALS FOR 30 DAYS. 60 capsule 5  . amitriptyline (ELAVIL) 50 MG tablet Take 1 tablet (50 mg total) by mouth at bedtime. 30 tablet 2  . carvedilol (COREG) 6.25 MG tablet TAKE 1 & 1/2 TABLETS BY MOUTH TWICE DAILY. NEEDS APPT FOR FURTHER REFILLS 90 tablet 1  . cholecalciferol (VITAMIN D3) 25 MCG (1000 UT) tablet Take 3,000 Units by mouth daily.    . cyclobenzaprine (FLEXERIL) 10 MG tablet Take 10 mg by mouth 3 (three) times daily.  1  . docusate sodium (COLACE) 100 MG capsule Take 100 mg by mouth 2 (two) times daily.   6  . Dulaglutide (TRULICITY) 4.09 WJ/1.9JY SOPN Inject 0.75 mg into the skin once a week.    . gabapentin (NEURONTIN) 600 MG tablet Take 600 mg by mouth 4 (four) times daily.   3  . levothyroxine (SYNTHROID) 50 MCG tablet Take 50 mcg by mouth daily.    . Meloxicam 15 MG TBDP Take 15 mg by mouth daily.    . metFORMIN (GLUCOPHAGE-XR) 500 MG 24 hr tablet Take 500 mg 2 (two) times daily by mouth.   3  . mometasone-formoterol (DULERA) 200-5 MCG/ACT AERO Inhale 2 puffs into the lungs 2 (two) times daily. 2 Inhaler 0  . NUCYNTA ER 50 MG 12 hr tablet Take by mouth.    . oxyCODONE-acetaminophen (PERCOCET/ROXICET) 5-325 MG tablet Take 1 tablet by mouth 4 (four) times daily.    .  potassium chloride SA (KLOR-CON M20) 20 MEQ tablet Take 1 tablet (20 mEq total) by mouth daily. 30 tablet 0  . rosuvastatin (CRESTOR) 10 MG tablet Take 1 tablet (10 mg total) by mouth at bedtime. 30 tablet 5  . sacubitril-valsartan (ENTRESTO) 97-103 MG Take 1 tablet by mouth 2 (two) times daily. Needs appt for further refills 60 tablet 2  . spironolactone (ALDACTONE) 25 MG tablet Take 0.5 tablets (12.5 mg total) by mouth at bedtime. 45 tablet 3  . torsemide (DEMADEX) 20 MG tablet TAKE 2 TABLETS (40 MG TOTAL) BY MOUTH DAILY. NEEDS APPT FOR FURTHER REFILLS 60 tablet 1  . XARELTO 20 MG TABS tablet TAKE 1 TABLET BY MOUTH EVERY DAY 30 tablet 11  .  ipratropium-albuterol (DUONEB) 0.5-2.5 (3) MG/3ML SOLN Take 3 mLs by nebulization 2 (two) times daily. (Patient not taking: Reported on 05/14/2020) 360 mL 0  . magnesium oxide (MAG-OX) 400 MG tablet Take 400 mg by mouth daily. (Patient not taking: Reported on 05/14/2020)    . Multiple Vitamin (MULTIVITAMIN WITH MINERALS) TABS tablet Take 1 tablet by mouth daily. (Patient not taking: Reported on 05/14/2020)     No current facility-administered medications for this encounter.    Allergies:   Methocarbamol, Tramadol, and Bee venom   Social History:  The patient  reports that she quit smoking about 21 months ago. Her smoking use included cigarettes. She has a 8.50 pack-year smoking history. She has never used smokeless tobacco. She reports that she does not drink alcohol and does not use drugs.   Family History:  The patient's family history includes Hypertension in her father, mother, and sister.   ROS:  Please see the history of present illness.   All other systems are personally reviewed and negative.   Vitals:   05/14/20 1019  BP: (!) 142/86  Pulse: 82  SpO2: 97%  Weight: 129.4 kg (285 lb 3.2 oz)  Height: 6' (1.829 m)     Exam:   General:  Obese woman No resp difficulty HEENT: normal Neck: supple. no JVD. Carotids 2+ bilat; no bruits. No  lymphadenopathy or thryomegaly appreciated. Cor: PMI nondisplaced. Regular rate & rhythm. No rubs, gallops or murmurs. Lungs: clear Abdomen: obese soft, nontender, nondistended. No hepatosplenomegaly. No bruits or masses. Good bowel sounds. Extremities: no cyanosis, clubbing, rash, edema  R knee brace Neuro: alert & orientedx3, cranial nerves grossly intact. moves all 4 extremities w/o difficulty. Affect pleasant   Recent Labs: No results found for requested labs within last 8760 hours.  Personally reviewed   Wt Readings from Last 3 Encounters:  05/14/20 129.4 kg (285 lb 3.2 oz)  05/01/19 125.6 kg (276 lb 12.8 oz)  04/23/19 127.6 kg (281 lb 6.4 oz)      ASSESSMENT AND PLAN:  1. Chronic systolic HF due to NICM. Suspect possible PVC related vs viral or OSA  - Echo 12/25: EF 15-20%, severe diffuse HK and septal dyskinesis, grade 2 DD, mod MR, LA moderately dilated, RV moderately dilated with moderately reduced function, PA peak pressure 35 mm Hg. - Echo 5/20 EF 25-30% (improving with PVC suppression) - hold off on ICD eval for now - Echo 40-45% (I thought 50%)- markedly improved with suppression of PVCs - [facility] 12/19 showed marked volume overload with low output, severe NICM with EF 15%, and minimal non-obstructive CAD.She has had a hysterectomy .  - No MRI with hip replacements and size - NYHA II. Functional status limited by orthopedic issues - Increase carvedilol to 12.5 twice a day.  - Continue Entresto 97/103 mg  twice a day.    - Continue spiro 12.5 mg daily. - Agree with addition of Farxiga. Can switch torsemide to PRN.  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 Minimal nonobstructive CAD: - LHC 12/27: 30% Prox LAD - No s/s ischemia - Continue crestor. -No ASA with Xarelto use  3. DVT - Continue Xarelto 20 mg dailly. No bleeding  4. OSA: - Continue CPAP  5. COPD: - Continue chantix.   6. NSVT/PVCs  - Zio Patch placed having 10% PVC  burden. Having at least 2 different morphologies.  - Started amio 200 mg twice a day on 12/05/2018.  - Has total PVC  suppression on last visit with Paramedicine - Given young age need to consider switching amio to another agent like mexilitene 200 bid to avoid risks of long-term toxicity. Suspect would be difficult candidate for ablation.  - Will place zio to requantify PVC prior to making a switch  7. DM2 - start Farxiga - discussed risk of yeast infections   Glori Bickers, MD  05/14/2020 10:35 AM  Advanced Heart Failure [facility] [place] [address] [phone] (office) [phone] (fax)

## 2020-05-14 NOTE — Addendum Note (Signed)
Encounter addended by: Scarlette Calico, RN on: 05/14/2020 11:50 AM  Actions taken: Clinical Note Signed

## 2020-05-15 ENCOUNTER — Telehealth (HOSPITAL_COMMUNITY): Payer: Self-pay | Admitting: Pharmacy Technician

## 2020-05-15 NOTE — Telephone Encounter (Signed)
Patient was started on Farxiga, the current co-pay is $15. Was able to obtain a co-pay card for her that will take the amount to $0.  Pharmacy Billing Information  [place]: [id number]  PCN: [id number]  Group: [id number]  ID: [id number]  Called and provided the pharmacy the billing information.  Charlann Boxer, CPhT

## 2020-05-16 ENCOUNTER — Other Ambulatory Visit (HOSPITAL_COMMUNITY): Payer: Self-pay | Admitting: Internal Medicine

## 2020-05-22 ENCOUNTER — Other Ambulatory Visit: Payer: Self-pay

## 2020-05-22 ENCOUNTER — Ambulatory Visit (HOSPITAL_COMMUNITY)
Admission: RE | Admit: 2020-05-22 | Discharge: 2020-05-22 | Disposition: A | Discharge status: Home / Self Care | Payer: 59 | Source: Ambulatory Visit | Attending: Internal Medicine | Admitting: Internal Medicine

## 2020-05-22 DIAGNOSIS — Z87891 Personal history of nicotine dependence: Secondary | ICD-10-CM | POA: Diagnosis not present

## 2020-05-22 DIAGNOSIS — E785 Hyperlipidemia, unspecified: Secondary | ICD-10-CM | POA: Insufficient documentation

## 2020-05-22 DIAGNOSIS — I5022 Chronic systolic (congestive) heart failure: Secondary | ICD-10-CM

## 2020-05-22 DIAGNOSIS — E119 Type 2 diabetes mellitus without complications: Secondary | ICD-10-CM | POA: Insufficient documentation

## 2020-05-22 LAB — ECHOCARDIOGRAM COMPLETE
Area-P 1/2: 5.42 cm2
S' Lateral: 3.4 cm

## 2020-05-22 NOTE — Progress Notes (Signed)
  Echocardiogram 2D Echocardiogram has been performed.  Anna Rowe 05/22/2020, 11:43 AM

## 2020-06-02 ENCOUNTER — Telehealth (HOSPITAL_COMMUNITY): Payer: Self-pay

## 2020-06-02 NOTE — Telephone Encounter (Signed)
Malena Edman, RN  06/02/2020 3:48 PM EDT Back to Top    Patient advised and verbalized understanding

## 2020-06-02 NOTE — Telephone Encounter (Signed)
-----   Message from Jolaine Artist, MD sent at 05/30/2020  4:51 PM EDT ----- EF stable 45-50%

## 2020-06-16 ENCOUNTER — Other Ambulatory Visit: Payer: Self-pay

## 2020-06-16 ENCOUNTER — Ambulatory Visit (HOSPITAL_COMMUNITY)
Admission: RE | Admit: 2020-06-16 | Discharge: 2020-06-16 | Disposition: A | Discharge status: Home / Self Care | Payer: 59 | Source: Ambulatory Visit | Attending: Internal Medicine | Admitting: Internal Medicine

## 2020-06-16 DIAGNOSIS — I493 Ventricular premature depolarization: Secondary | ICD-10-CM

## 2020-07-28 ENCOUNTER — Other Ambulatory Visit (HOSPITAL_COMMUNITY): Payer: Self-pay | Admitting: Internal Medicine

## 2020-08-23 ENCOUNTER — Other Ambulatory Visit (HOSPITAL_COMMUNITY): Payer: Self-pay | Admitting: Internal Medicine

## 2020-10-16 ENCOUNTER — Other Ambulatory Visit (HOSPITAL_COMMUNITY): Payer: Self-pay | Admitting: Internal Medicine

## 2020-11-04 ENCOUNTER — Telehealth (HOSPITAL_COMMUNITY): Payer: Self-pay | Admitting: Licensed Clinical Social Worker

## 2020-11-04 NOTE — Telephone Encounter (Signed)
CSW called pt to discuss new womens' "Heart Strong" support group.  Pt is agreeable to being added to mailing list for reminders to be sent out but does not plan to attend until COVID is less of a concern.  CSW confirmed current address and will send out reminders to pt each month to keep her informed.  CSW will continue to follow and assist as needed  Jorge Ny, [facility] Desk#: [phone] Cell#: [phone]

## 2020-11-13 ENCOUNTER — Other Ambulatory Visit (HOSPITAL_COMMUNITY): Payer: Self-pay | Admitting: Internal Medicine

## 2020-12-16 ENCOUNTER — Other Ambulatory Visit (HOSPITAL_COMMUNITY): Payer: Self-pay | Admitting: Internal Medicine

## 2021-02-09 ENCOUNTER — Other Ambulatory Visit (HOSPITAL_COMMUNITY): Payer: Self-pay | Admitting: Internal Medicine

## 2021-02-11 ENCOUNTER — Telehealth: Payer: Self-pay | Admitting: Licensed Clinical Social Worker

## 2021-02-11 NOTE — Telephone Encounter (Signed)
Reached pt at [phone]; introduced self, role, reason for call. Invited pt to join [place] at [facility] Group which will be held 7/6 at 10 am at [place]. Encouraged her to call the clinic if any additional questions/concerns. Pt still limiting her time out due to [place].   Westley Hummer, MSW, [place]  [phone]

## 2021-04-06 ENCOUNTER — Encounter (HOSPITAL_COMMUNITY): Payer: Self-pay | Admitting: Internal Medicine

## 2021-04-06 ENCOUNTER — Other Ambulatory Visit: Payer: Self-pay

## 2021-04-06 ENCOUNTER — Ambulatory Visit (HOSPITAL_COMMUNITY)
Admission: RE | Admit: 2021-04-06 | Discharge: 2021-04-06 | Disposition: A | Discharge status: Home / Self Care | Payer: 59 | Source: Ambulatory Visit | Attending: Internal Medicine | Admitting: Internal Medicine

## 2021-04-06 VITALS — BP 118/70 | HR 87 | Wt 276.2 lb

## 2021-04-06 DIAGNOSIS — Z9119 Patient's noncompliance with other medical treatment and regimen: Secondary | ICD-10-CM | POA: Diagnosis not present

## 2021-04-06 DIAGNOSIS — Z791 Long term (current) use of non-steroidal anti-inflammatories (NSAID): Secondary | ICD-10-CM | POA: Diagnosis not present

## 2021-04-06 DIAGNOSIS — Z79899 Other long term (current) drug therapy: Secondary | ICD-10-CM | POA: Diagnosis not present

## 2021-04-06 DIAGNOSIS — Z09 Encounter for follow-up examination after completed treatment for conditions other than malignant neoplasm: Secondary | ICD-10-CM | POA: Insufficient documentation

## 2021-04-06 DIAGNOSIS — I472 Ventricular tachycardia: Secondary | ICD-10-CM | POA: Diagnosis not present

## 2021-04-06 DIAGNOSIS — E119 Type 2 diabetes mellitus without complications: Secondary | ICD-10-CM | POA: Insufficient documentation

## 2021-04-06 DIAGNOSIS — I2510 Atherosclerotic heart disease of native coronary artery without angina pectoris: Secondary | ICD-10-CM | POA: Insufficient documentation

## 2021-04-06 DIAGNOSIS — Z7951 Long term (current) use of inhaled steroids: Secondary | ICD-10-CM | POA: Diagnosis not present

## 2021-04-06 DIAGNOSIS — J449 Chronic obstructive pulmonary disease, unspecified: Secondary | ICD-10-CM | POA: Diagnosis not present

## 2021-04-06 DIAGNOSIS — Z87891 Personal history of nicotine dependence: Secondary | ICD-10-CM | POA: Insufficient documentation

## 2021-04-06 DIAGNOSIS — M25569 Pain in unspecified knee: Secondary | ICD-10-CM | POA: Diagnosis not present

## 2021-04-06 DIAGNOSIS — I493 Ventricular premature depolarization: Secondary | ICD-10-CM | POA: Diagnosis not present

## 2021-04-06 DIAGNOSIS — Z7984 Long term (current) use of oral hypoglycemic drugs: Secondary | ICD-10-CM | POA: Diagnosis not present

## 2021-04-06 DIAGNOSIS — R008 Other abnormalities of heart beat: Secondary | ICD-10-CM | POA: Insufficient documentation

## 2021-04-06 DIAGNOSIS — M549 Dorsalgia, unspecified: Secondary | ICD-10-CM | POA: Diagnosis not present

## 2021-04-06 DIAGNOSIS — Z7901 Long term (current) use of anticoagulants: Secondary | ICD-10-CM | POA: Diagnosis not present

## 2021-04-06 DIAGNOSIS — G4733 Obstructive sleep apnea (adult) (pediatric): Secondary | ICD-10-CM

## 2021-04-06 DIAGNOSIS — Z86718 Personal history of other venous thrombosis and embolism: Secondary | ICD-10-CM | POA: Insufficient documentation

## 2021-04-06 DIAGNOSIS — I428 Other cardiomyopathies: Secondary | ICD-10-CM | POA: Diagnosis not present

## 2021-04-06 DIAGNOSIS — I5022 Chronic systolic (congestive) heart failure: Secondary | ICD-10-CM | POA: Insufficient documentation

## 2021-04-06 DIAGNOSIS — Z8249 Family history of ischemic heart disease and other diseases of the circulatory system: Secondary | ICD-10-CM | POA: Diagnosis not present

## 2021-04-06 MED ORDER — MEXILETINE HCL 200 MG PO CAPS: 200.0000 mg | ORAL_CAPSULE | Freq: Two times a day (BID) | ORAL | 6 refills | Status: DC

## 2021-04-06 NOTE — Progress Notes (Signed)
Advanced Heart Failure Clinic Note  Date:  04/06/2021   ID:  Anna Rowe, DOB [birth year], MRN [id number]  Location: Home  Provider location: [facility] Type of Visit: Established patient  PCP:  [facility] [place]  Cardiologist:  None Primary HF: Anna Rowe  Chief Complaint: Heart Failure follow-up   History of Present Illness:  Anna Rowe is a 45 y.o. female with a history of morbid obesity, DM2, COPD with ongoing tobacco use, OSA noncompliant with CPAP, chronic DVT (diagnosed 2018) on Xarelto, severe OA s/p R TKR and R/L hip replacements.     Of note, she states that her dad had HF and received an LVAD, after a similar "viral cardiomyopathy" type picture   Admitted to [facility] 12/19 with acute HF.Marland Kitchen Echo showed newly reduced EF 15-20%. [facility] 12/27 showed marked volume overload and low output and minimal nonobstructive CAD. Diuresed on milrinone, and weaned off as tolerated as medications titrated.    Monitor 4/20 -> started on amio for PVC suppression  1. Predominant rhythm is sinus. Patient had a min HR of 71 bpm, max HR of 174 bpm, and avg HR of 98 bpm. 2. 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 Fifty episodes of brief NSVT up to 6 beats 4. Several patient-triggered events associated with either PVCs or normal sinus rhythm   Echo 5/20 EF 25-30%  Echo 9/20 EF EF 40-45% (I thought closer to ~50%)  Echo 10/21 45-50%    She presents today for routine f/u. Doing well. Gets around with walker due to severe back and knee pain. Mild exertional dyspnea. No edema, orthopnea or PND.   Cardiac studies:   Echo 08/08/18: EF 15-20%, severe diffuse HK and septal dyskinesis, grade 2 DD, mod MR, LA moderately dilated, RV moderately dilated with moderately reduced function, PA peak pressure 35 mm Hg   Cath 12/27 Findings: Ao = 99/75 (81) LV = 93/31 RA  = 22 RV = 54/25 PA = 58/24 (41) PCW = 30 Fick cardiac output/index = 4.1/1.6 SVR = 1163 PVR = 2.6 FA sat = 89% PA sat = 42%, 44% Assessment: 1. 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 Severe NICM with LVEF ~ 15% 3. Marked volume overload with low output   Anna Rowe denies symptoms worrisome for COVID 19.   Past Medical History:  Diagnosis Date   Arthritis    CHF (congestive heart failure) ([facility])    COPD (chronic obstructive pulmonary disease) ([facility])    Diabetes mellitus ([place])    type 2    DVT (deep venous thrombosis) ([place]) dx 10-2016   RLE    HLD (hyperlipidemia)    on crestor   Pneumonia    09/2016   Sleep apnea    per patient ;been dx does not use CPAP does not tolerate   Past Surgical History:  Procedure Laterality Date   ACHILLES TENDON REPAIR     HERNIA REPAIR     x2; hiatal and umbilical    IVC FILTER INSERTION N/A 04/25/2017   Procedure: IVC Filter Insertion;  Surgeon: Serafina Mitchell, MD;  Location: [facility];  Service: Cardiovascular;  Laterality: N/A;   IVC FILTER REMOVAL N/A 10/24/2017   Procedure: IVC FILTER REMOVAL;  Surgeon: Serafina Mitchell, MD;  Location: [facility];  Service: Cardiovascular;  Laterality: N/A;   JOINT REPLACEMENT     right hip 07/14/17 Dr.  Blackman   KNEE ARTHROSCOPY Right 2015   plantar     RIGHT/LEFT HEART CATH AND CORONARY ANGIOGRAPHY N/A 08/10/2018   Procedure: RIGHT/LEFT HEART CATH AND CORONARY ANGIOGRAPHY;  Surgeon: Jolaine Artist, MD;  Location: [facility];  Service: Cardiovascular;  Laterality: N/A;   TOTAL HIP ARTHROPLASTY Left 04/28/2017   Procedure: LEFT TOTAL HIP ARTHROPLASTY ANTERIOR APPROACH;  Surgeon: Mcarthur Rossetti, MD;  [facility];  Service: Orthopedics;  Laterality: Left;   TOTAL HIP ARTHROPLASTY Right 07/14/2017   Procedure: RIGHT TOTAL HIP ARTHROPLASTY ANTERIOR APPROACH;  Surgeon: Mcarthur Rossetti, MD;  [facility];  Service: Orthopedics;   Laterality: Right;   TOTAL KNEE ARTHROPLASTY Right 04/04/2016   Procedure: TOTAL KNEE ARTHROPLASTY;  Surgeon: Dorna Leitz, MD;  Location: [facility];  Service: Orthopedics;  Laterality: Right;     Current Outpatient Medications  Medication Sig Dispense Refill   amiodarone (PACERONE) 200 MG tablet TAKE 1 TABLET (200 MG TOTAL) BY MOUTH DAILY. NEEDS APPT 90 tablet 1   amitriptyline (ELAVIL) 50 MG tablet Take 1 tablet (50 mg total) by mouth at bedtime. 30 tablet 2   carvedilol (COREG) 12.5 MG tablet TAKE 1 TABLET (12.5 MG TOTAL) BY MOUTH 2 (TWO) TIMES DAILY WITH A MEAL. 180 tablet 1   Cholecalciferol (VITAMIN D-3 PO) Take 50,000 Units by mouth once a week.     cyclobenzaprine (FLEXERIL) 10 MG tablet Take 10 mg by mouth 3 (three) times daily.  1   dapagliflozin propanediol (FARXIGA) 10 MG TABS tablet Take 1 tablet (10 mg total) by mouth daily before breakfast. Please call for office visit [phone] 30 tablet 0   docusate sodium (COLACE) 100 MG capsule Take 100 mg by mouth 2 (two) times daily.   6   Dulaglutide 0.75 MG/0.5ML SOPN Inject 0.75 mg into the skin once a week.     ENTRESTO 97-103 MG TAKE 1 TABLET BY MOUTH 2 (TWO) TIMES DAILY. NEEDS APPT FOR FURTHER REFILLS 60 tablet 6   gabapentin (NEURONTIN) 600 MG tablet Take 600 mg by mouth 4 (four) times daily.   3   ipratropium-albuterol (DUONEB) 0.5-2.5 (3) MG/3ML SOLN Take 3 mLs by nebulization as needed.     KLOR-CON M20 20 MEQ tablet TAKE 1 TABLET BY MOUTH EVERY DAY 90 tablet 2   levothyroxine (SYNTHROID) 50 MCG tablet Take 50 mcg by mouth daily.     Meloxicam 15 MG TBDP Take 15 mg by mouth daily.     metFORMIN (GLUCOPHAGE-XR) 500 MG 24 hr tablet Take 500 mg 2 (two) times daily by mouth.   3   mometasone-formoterol (DULERA) 200-5 MCG/ACT AERO Inhale 2 puffs into the lungs 2 (two) times daily. 2 Inhaler 0   naloxegol oxalate (MOVANTIK) 25 MG TABS tablet Take 25 mg by mouth daily.     NUCYNTA ER 50 MG 12 hr tablet Take by mouth.      oxyCODONE-acetaminophen (PERCOCET/ROXICET) 5-325 MG tablet Take 1 tablet by mouth 4 (four) times daily.     rosuvastatin (CRESTOR) 10 MG tablet Take 1 tablet (10 mg total) by mouth at bedtime. 30 tablet 5   spironolactone (ALDACTONE) 25 MG tablet Take 0.5 tablets (12.5 mg total) by mouth at bedtime. 45 tablet 3   XARELTO 20 MG TABS tablet TAKE 1 TABLET BY MOUTH EVERY DAY 30 tablet 11   No current facility-administered medications for this encounter.    Allergies:   Methocarbamol, Tramadol, and Bee venom   Social History:  The patient  reports that she quit smoking about 2 years ago. Her smoking use included cigarettes. She has a 8.50 pack-year smoking history. She has never used smokeless tobacco. She reports that she does not drink alcohol and does not use drugs.   Family History:  The patient's family history includes Hypertension in her father, mother, and sister.   ROS:  Please see the history of present illness.   All other systems are personally reviewed and negative.   Vitals:   04/06/21 1148  BP: 118/70  Pulse: 87  SpO2: 97%  Weight: 125.3 kg (276 lb 3.2 oz)     Exam:   General:  Well appearing. No resp difficulty HEENT: normal Neck: supple. no JVD. Carotids 2+ bilat; no bruits. No lymphadenopathy or thryomegaly appreciated. Cor: PMI nondisplaced. Regular rate & rhythm. No rubs, gallops or murmurs. Lungs: clear Abdomen: obese soft, nontender, nondistended. No hepatosplenomegaly. No bruits or masses. Good bowel sounds. Extremities: no cyanosis, clubbing, rash, edema Neuro: alert & orientedx3, cranial nerves grossly intact. moves all 4 extremities w/o difficulty. Affect pleasant  Recent Labs: 05/14/2020: ALT 22; B Natriuretic Peptide 19.4; BUN 12; Creatinine, Ser 1.30; Hemoglobin 12.5; Platelets 429; Potassium 5.1; Sodium 134  Personally reviewed   Wt Readings from Last 3 Encounters:  04/06/21 125.3 kg (276 lb 3.2 oz)  05/14/20 129.4 kg (285 lb 3.2 oz)  05/01/19 125.6  kg (276 lb 12.8 oz)      ASSESSMENT AND PLAN:  1. Chronic systolic HF due to NICM.  Suspect possible PVC related vs viral or OSA  - Echo 12/25: EF 15-20%, severe diffuse HK and septal dyskinesis, grade 2 DD, mod MR, LA moderately dilated, RV moderately dilated with moderately reduced function, PA peak pressure 35 mm Hg.  - Echo 5/20 EF 25-30% (improving with PVC suppression) - hold off on ICD eval for now - [facility] 12/19 showed marked volume overload with low output, severe NICM with EF 15%, and minimal non-obstructive CAD. She has had a hysterectomy .  - Echo 9/20 40-45% (I thought 50%)- markedly improved with suppression of PVCs - Echo 10/21 45-50%  - No MRI with hip replacements and size - NYHA II. Functional status limited by orthopedic issues - Continue carvedilol 12.5 bid  - Continue Entresto 97/103 mg  twice a day.    - Continue spiro 12.5 mg daily. - Continue Farxiga 10   2. Minimal nonobstructive CAD:  - LHC 12/27: 30% Prox LAD - No s/s ischemia  - Continue crestor.  - No ASA with Xarelto use   3. DVT - Continue Xarelto 20 mg dailly. No bleeding.    4. OSA:  - Continue CPAP. She is complaint   5. COPD:  - Remains quit    6. NSVT/PVCs  - Zio Patch placed having 10% PVC burden. Having at least 2 different morphologies.  - Started amio 200 mg twice a day on 12/05/2018.  - Has total PVC suppression on last visit with Paramedicine - Zio 11/21 3.1% PVCs; 185 runs of NSVT. With near normal EF not ICD candidate - Given young age would like to avoid long-term amio exposure. Will switch amio to mexilitene 200 bid. Will see back in 4 months and repeat monitor to ensure PVC suppression   7. DM2 - continue Anna Albright, MD  04/06/2021 12:03 PM  Advanced Heart Failure [facility] [address] [facility] [place] [address] [phone] (office) [phone] (fax)

## 2021-04-06 NOTE — Addendum Note (Signed)
Encounter addended by: Scarlette Calico, RN on: 04/06/2021 12:34 PM  Actions taken: Medication long-term status modified, Order list changed, Pharmacy for encounter modified, Clinical Note Signed

## 2021-04-06 NOTE — Patient Instructions (Signed)
Stop Amiodarone  Start Mexiletine 200 mg Twice daily   Your physician recommends that you schedule a follow-up appointment in: 4 months  If you have any questions or concerns before your next appointment please send [place] a message through [place] or call our office at [phone].    TO LEAVE A MESSAGE FOR THE NURSE SELECT OPTION 2, PLEASE LEAVE A MESSAGE INCLUDING: YOUR NAME DATE OF BIRTH CALL BACK NUMBER REASON FOR CALL**this is important as we prioritize the call backs  YOU WILL RECEIVE A CALL BACK THE SAME DAY AS LONG AS YOU CALL BEFORE 4:00 PM  At the [facility], you and your health needs are our priority. As part of our continuing mission to provide you with exceptional heart care, we have created designated Provider Care Teams. These Care Teams include your primary Cardiologist (physician) and Advanced Practice Providers (APPs- Physician Assistants and Nurse Practitioners) who all work together to provide you with the care you need, when you need it.   You may see any of the following providers on your designated Care Team at your next follow up: Dr Glori Bickers Dr Loralie Champagne Dr Patrice Paradise, NP Lyda Jester, [place] Ginnie Smart Audry Riles, PharmD   Please be sure to bring in all your medications bottles to every appointment.

## 2021-05-15 ENCOUNTER — Other Ambulatory Visit (HOSPITAL_COMMUNITY): Payer: Self-pay | Admitting: Internal Medicine

## 2021-06-03 ENCOUNTER — Other Ambulatory Visit (HOSPITAL_COMMUNITY): Payer: Self-pay | Admitting: Internal Medicine

## 2021-06-15 ENCOUNTER — Other Ambulatory Visit (HOSPITAL_COMMUNITY): Payer: Self-pay | Admitting: Internal Medicine

## 2021-06-17 ENCOUNTER — Telehealth (HOSPITAL_COMMUNITY): Payer: Self-pay | Admitting: Pharmacy Technician

## 2021-06-17 NOTE — Telephone Encounter (Signed)
Advanced Heart Failure Patient Advocate Encounter  Prior Authorization for Delene Loll has been approved.    PA# [id number] Effective dates: 06/17/21 through 06/17/22  Charlann Boxer, CPhT

## 2021-06-17 NOTE — Telephone Encounter (Signed)
Patient Advocate Encounter   Received notification from [place] that prior authorization for Delene Loll is required.   PA submitted on CoverMyMeds Key [id number] Status is pending   Will continue to follow.

## 2021-06-18 MED ORDER — ENTRESTO 97-103 MG PO TABS: 1.0000 | ORAL_TABLET | Freq: Two times a day (BID) | ORAL | 11 refills | Status: DC

## 2021-06-18 NOTE — Addendum Note (Signed)
Addended by: Shela Nevin R on: 24/03/2499 04:51 PM   Modules accepted: Orders

## 2021-08-05 ENCOUNTER — Other Ambulatory Visit (HOSPITAL_COMMUNITY): Payer: Self-pay | Admitting: Internal Medicine

## 2021-08-05 ENCOUNTER — Ambulatory Visit (HOSPITAL_COMMUNITY)
Admission: RE | Admit: 2021-08-05 | Discharge: 2021-08-05 | Disposition: A | Discharge status: Home / Self Care | Payer: BC Managed Care – PPO | Source: Ambulatory Visit | Attending: Internal Medicine | Admitting: Internal Medicine

## 2021-08-05 ENCOUNTER — Encounter (HOSPITAL_COMMUNITY): Payer: Self-pay | Admitting: Internal Medicine

## 2021-08-05 ENCOUNTER — Other Ambulatory Visit: Payer: Self-pay

## 2021-08-05 ENCOUNTER — Ambulatory Visit (HOSPITAL_COMMUNITY)
Admission: RE | Admit: 2021-08-05 | Discharge: 2021-08-05 | Disposition: A | Discharge status: Home / Self Care | Payer: 59 | Source: Ambulatory Visit | Attending: Internal Medicine | Admitting: Internal Medicine

## 2021-08-05 VITALS — BP 130/88 | HR 87 | Wt 284.4 lb

## 2021-08-05 DIAGNOSIS — M549 Dorsalgia, unspecified: Secondary | ICD-10-CM | POA: Diagnosis not present

## 2021-08-05 DIAGNOSIS — Z96651 Presence of right artificial knee joint: Secondary | ICD-10-CM | POA: Diagnosis not present

## 2021-08-05 DIAGNOSIS — J449 Chronic obstructive pulmonary disease, unspecified: Secondary | ICD-10-CM | POA: Insufficient documentation

## 2021-08-05 DIAGNOSIS — M25552 Pain in left hip: Secondary | ICD-10-CM | POA: Diagnosis not present

## 2021-08-05 DIAGNOSIS — I10 Essential (primary) hypertension: Secondary | ICD-10-CM | POA: Diagnosis not present

## 2021-08-05 DIAGNOSIS — Z7901 Long term (current) use of anticoagulants: Secondary | ICD-10-CM | POA: Insufficient documentation

## 2021-08-05 DIAGNOSIS — I428 Other cardiomyopathies: Secondary | ICD-10-CM | POA: Diagnosis not present

## 2021-08-05 DIAGNOSIS — M1990 Unspecified osteoarthritis, unspecified site: Secondary | ICD-10-CM | POA: Diagnosis not present

## 2021-08-05 DIAGNOSIS — Z90710 Acquired absence of both cervix and uterus: Secondary | ICD-10-CM | POA: Diagnosis not present

## 2021-08-05 DIAGNOSIS — Z9989 Dependence on other enabling machines and devices: Secondary | ICD-10-CM | POA: Insufficient documentation

## 2021-08-05 DIAGNOSIS — G4733 Obstructive sleep apnea (adult) (pediatric): Secondary | ICD-10-CM | POA: Diagnosis not present

## 2021-08-05 DIAGNOSIS — I493 Ventricular premature depolarization: Secondary | ICD-10-CM | POA: Insufficient documentation

## 2021-08-05 DIAGNOSIS — I5022 Chronic systolic (congestive) heart failure: Secondary | ICD-10-CM | POA: Diagnosis not present

## 2021-08-05 DIAGNOSIS — Z01818 Encounter for other preprocedural examination: Secondary | ICD-10-CM | POA: Diagnosis not present

## 2021-08-05 DIAGNOSIS — Z87891 Personal history of nicotine dependence: Secondary | ICD-10-CM | POA: Insufficient documentation

## 2021-08-05 DIAGNOSIS — Z7984 Long term (current) use of oral hypoglycemic drugs: Secondary | ICD-10-CM | POA: Diagnosis not present

## 2021-08-05 DIAGNOSIS — Z7985 Long-term (current) use of injectable non-insulin antidiabetic drugs: Secondary | ICD-10-CM | POA: Diagnosis not present

## 2021-08-05 DIAGNOSIS — Z96643 Presence of artificial hip joint, bilateral: Secondary | ICD-10-CM | POA: Diagnosis not present

## 2021-08-05 DIAGNOSIS — Z86718 Personal history of other venous thrombosis and embolism: Secondary | ICD-10-CM | POA: Insufficient documentation

## 2021-08-05 DIAGNOSIS — Z79899 Other long term (current) drug therapy: Secondary | ICD-10-CM | POA: Diagnosis not present

## 2021-08-05 DIAGNOSIS — I2510 Atherosclerotic heart disease of native coronary artery without angina pectoris: Secondary | ICD-10-CM | POA: Insufficient documentation

## 2021-08-05 DIAGNOSIS — M25551 Pain in right hip: Secondary | ICD-10-CM | POA: Insufficient documentation

## 2021-08-05 DIAGNOSIS — E119 Type 2 diabetes mellitus without complications: Secondary | ICD-10-CM | POA: Diagnosis not present

## 2021-08-05 DIAGNOSIS — Z8249 Family history of ischemic heart disease and other diseases of the circulatory system: Secondary | ICD-10-CM | POA: Insufficient documentation

## 2021-08-05 DIAGNOSIS — I472 Ventricular tachycardia, unspecified: Secondary | ICD-10-CM | POA: Diagnosis not present

## 2021-08-05 LAB — BRAIN NATRIURETIC PEPTIDE: B Natriuretic Peptide: 33.2 pg/mL (ref 0.0–100.0)

## 2021-08-05 LAB — COMPREHENSIVE METABOLIC PANEL
ALT: 13 U/L (ref 0–44)
AST: 13 U/L — ABNORMAL LOW (ref 15–41)
Albumin: 3.6 g/dL (ref 3.5–5.0)
Alkaline Phosphatase: 54 U/L (ref 38–126)
Anion gap: 8 (ref 5–15)
BUN: 7 mg/dL (ref 6–20)
CO2: 21 mmol/L — ABNORMAL LOW (ref 22–32)
Calcium: 9.5 mg/dL (ref 8.9–10.3)
Chloride: 106 mmol/L (ref 98–111)
Creatinine, Ser: 1.12 mg/dL — ABNORMAL HIGH (ref 0.44–1.00)
GFR, Estimated: >60 mL/min (ref >60)
Glucose, Bld: 117 mg/dL — ABNORMAL HIGH (ref 70–99)
Potassium: 4.4 mmol/L (ref 3.5–5.1)
Sodium: 135 mmol/L (ref 135–145)
Total Bilirubin: 0.4 mg/dL (ref 0.3–1.2)
Total Protein: 7.3 g/dL (ref 6.5–8.1)

## 2021-08-05 NOTE — Progress Notes (Addendum)
Advanced Heart Failure Clinic Note  Date:  08/05/2021   ID:  Anna Rowe, DOB 1975-12-23, MRN [id number]  Location: Home  Provider location: [place] [facility] Type of Visit: Established patient  PCP:  [facility] [place]  Cardiologist:  None Primary HF: Moataz Tavis  Chief Complaint: Heart Failure follow-up   History of Present Illness:  Anna Rowe is a 45 y.o. female with a history of morbid obesity, DM2, COPD with ongoing tobacco use, OSA noncompliant with CPAP, chronic DVT (diagnosed 2018) on Xarelto, severe OA s/p R TKR and R/L hip replacements.     Of note, she states that her dad had HF and received an LVAD, after a similar "viral cardiomyopathy" type picture   Admitted to [facility] 12/19 with acute HF.[facility] Echo showed newly reduced EF 15-20%. [facility] 12/27 showed marked volume overload and low output and minimal nonobstructive CAD. Diuresed on milrinone, and weaned off as tolerated as medications titrated.    Monitor 4/20 -> started on amio for PVC suppression  1. Predominant rhythm is sinus. Patient had a min HR of 71 bpm, max HR of 174 bpm, and avg HR of 98 bpm. 2. 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 Fifty episodes of brief NSVT up to 6 beats 4. Several patient-triggered events associated with either PVCs or normal sinus rhythm   Echo 5/20 EF 25-30%  Echo 9/20 EF EF 40-45% (I thought closer to ~50%)  Echo 10/21 45-50%   Here for routine f/u. At last visit we switched amio to mexilitene to avoid long-term exposure to [facility]. Has not felt palpitations or PVC. Doesn't get around much due to pain in back and hips. No edema, orthopnea or PND. Says Wilder Glade works much better than torsemide.    Cardiac studies:   Echo 08/08/18: EF 15-20%, severe diffuse HK and septal dyskinesis, grade 2 DD, mod MR, LA moderately dilated, RV moderately dilated with  moderately reduced function, PA peak pressure 35 mm Hg   Cath 12/27 Findings: Ao = 99/75 (81) LV = 93/31 RA = 22 RV = 54/25 PA = 58/24 (41) PCW = 30 Fick cardiac output/index = 4.1/1.6 SVR = 1163 PVR = 2.6 FA sat = 89% PA sat = 42%, 44% Assessment: 1. 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 Severe NICM with LVEF ~ 15% 3. Marked volume overload with low output   Anna Rowe denies symptoms worrisome for COVID 19.   Past Medical History:  Diagnosis Date   Arthritis    CHF (congestive heart failure) (HCC)    COPD (chronic obstructive pulmonary disease) (HCC)    Diabetes mellitus ([place])    type 2    DVT (deep venous thrombosis) ([place]) dx 10-2016   RLE    HLD (hyperlipidemia)    on crestor   Pneumonia    09/2016   Sleep apnea    per patient ;been dx does not use CPAP does not tolerate   Past Surgical History:  Procedure Laterality Date   ACHILLES TENDON REPAIR     HERNIA REPAIR     x2; hiatal and umbilical    IVC FILTER INSERTION N/A 04/25/2017   Procedure: IVC Filter Insertion;  Surgeon: Serafina Mitchell, MD;  Location: [place] CV [facility];  Service: Cardiovascular;  Laterality: N/A;   IVC FILTER REMOVAL N/A 10/24/2017   Procedure: IVC FILTER REMOVAL;  Surgeon: Serafina Mitchell, MD;  Location: [employer]  CV LAB;  Service: Cardiovascular;  Laterality: N/A;   JOINT REPLACEMENT     right hip 07/14/17 Dr. Ninfa Linden   KNEE ARTHROSCOPY Right 2015   plantar     RIGHT/LEFT HEART CATH AND CORONARY ANGIOGRAPHY N/A 08/10/2018   Procedure: RIGHT/LEFT HEART CATH AND CORONARY ANGIOGRAPHY;  Surgeon: Jolaine Artist, MD;  Location: [place] CV LAB;  Service: Cardiovascular;  Laterality: N/A;   TOTAL HIP ARTHROPLASTY Left 04/28/2017   Procedure: LEFT TOTAL HIP ARTHROPLASTY ANTERIOR APPROACH;  Surgeon: Mcarthur Rossetti, MD;  Location: [facility];  Service: Orthopedics;  Laterality: Left;   TOTAL HIP ARTHROPLASTY Right 07/14/2017   Procedure: RIGHT TOTAL HIP  ARTHROPLASTY ANTERIOR APPROACH;  Surgeon: Mcarthur Rossetti, MD;  Location: [facility];  Service: Orthopedics;  Laterality: Right;   TOTAL KNEE ARTHROPLASTY Right 04/04/2016   Procedure: TOTAL KNEE ARTHROPLASTY;  Surgeon: Dorna Leitz, MD;  Location: [place];  Service: Orthopedics;  Laterality: Right;     Current Outpatient Medications  Medication Sig Dispense Refill   amitriptyline (ELAVIL) 50 MG tablet Take 1 tablet (50 mg total) by mouth at bedtime. 30 tablet 2   carvedilol (COREG) 12.5 MG tablet TAKE 1 TABLET (12.5 MG TOTAL) BY MOUTH 2 (TWO) TIMES DAILY WITH A MEAL. 180 tablet 1   Cholecalciferol (VITAMIN D-3 PO) Take 50,000 Units by mouth once a week.     cyclobenzaprine (FLEXERIL) 10 MG tablet Take 10 mg by mouth 3 (three) times daily.  1   dapagliflozin propanediol (FARXIGA) 10 MG TABS tablet Take 1 tablet (10 mg total) by mouth daily before breakfast. Please call for office visit [phone] 30 tablet 0   docusate sodium (COLACE) 100 MG capsule Take 100 mg by mouth 2 (two) times daily.   6   Dulaglutide 0.75 MG/0.5ML SOPN Inject 0.75 mg into the skin once a week.     gabapentin (NEURONTIN) 600 MG tablet Take 600 mg by mouth 4 (four) times daily.   3   ipratropium-albuterol (DUONEB) 0.5-2.5 (3) MG/3ML SOLN Take 3 mLs by nebulization as needed.     KLOR-CON M20 20 MEQ tablet TAKE 1 TABLET BY MOUTH EVERY DAY 90 tablet 2   levothyroxine (SYNTHROID) 50 MCG tablet Take 50 mcg by mouth daily.     Meloxicam 15 MG TBDP Take 15 mg by mouth daily.     metFORMIN (GLUCOPHAGE-XR) 500 MG 24 hr tablet Take 500 mg 2 (two) times daily by mouth.   3   mexiletine (MEXITIL) 200 MG capsule Take 1 capsule (200 mg total) by mouth 2 (two) times daily. 60 capsule 6   mometasone-formoterol (DULERA) 200-5 MCG/ACT AERO Inhale 2 puffs into the lungs 2 (two) times daily. 2 Inhaler 0   naloxegol oxalate (MOVANTIK) 25 MG TABS tablet Take 25 mg by mouth daily.     oxyCODONE-acetaminophen (PERCOCET) 7.5-325 MG tablet  Take 1 tablet by mouth in the morning, at noon, and at bedtime.     rosuvastatin (CRESTOR) 10 MG tablet Take 1 tablet (10 mg total) by mouth at bedtime. 30 tablet 5   sacubitril-valsartan (ENTRESTO) 97-103 MG Take 1 tablet by mouth 2 (two) times daily. 60 tablet 11   spironolactone (ALDACTONE) 25 MG tablet TAKE 0.5 TABLETS (12.5 MG TOTAL) BY MOUTH AT BEDTIME. 15 tablet 11   tapentadol (NUCYNTA) 50 MG tablet Take 50 mg by mouth 2 (two) times daily.     XARELTO 20 MG TABS tablet TAKE 1 TABLET BY MOUTH EVERY DAY 30 tablet 11   No  current facility-administered medications for this encounter.    Allergies:   Methocarbamol, Tramadol, and Bee venom   Social History:  The patient  reports that she quit smoking about 2 years ago. Her smoking use included cigarettes. She has a 8.50 pack-year smoking history. She has never used smokeless tobacco. She reports that she does not drink alcohol and does not use drugs.   Family History:  The patient's family history includes Hypertension in her father, mother, and sister.   ROS:  Please see the history of present illness.   All other systems are personally reviewed and negative.   Vitals:   08/05/21 1117  BP: 130/88  Pulse: 87  SpO2: 98%  Weight: 129 kg (284 lb 6.4 oz)     Exam:   General:  Well appearing. No resp difficulty HEENT: normal Neck: supple. no JVD. Carotids 2+ bilat; no bruits. No lymphadenopathy or thryomegaly appreciated. Cor: PMI nondisplaced. Regular rate & rhythm. No rubs, gallops or murmurs. Lungs: clear Abdomen: obese soft, nontender, nondistended. No hepatosplenomegaly. No bruits or masses. Good bowel sounds. Extremities: no cyanosis, clubbing, rash, edema Neuro: alert & orientedx3, cranial nerves grossly intact. moves all 4 extremities w/o difficulty. Affect pleasant    Recent Labs: No results found for requested labs within last 8760 hours.  Personally reviewed   Wt Readings from Last 3 Encounters:  08/05/21 129 kg  (284 lb 6.4 oz)  04/06/21 125.3 kg (276 lb 3.2 oz)  05/14/20 129.4 kg (285 lb 3.2 oz)      ASSESSMENT AND PLAN:  1. Chronic systolic HF due to NICM.  Suspect possible PVC related vs viral or OSA  - Echo 12/25: EF 15-20%, severe diffuse HK and septal dyskinesis, grade 2 DD, mod MR, LA moderately dilated, RV moderately dilated with moderately reduced function, PA peak pressure 35 mm Hg.  - Echo 5/20 EF 25-30% (improving with PVC suppression) - hold off on ICD eval for now - [facility] 12/19 showed marked volume overload with low output, severe NICM with EF 15%, and minimal non-obstructive CAD. She has had a hysterectomy .  - Echo 9/20 40-45% (I thought 50%)- markedly improved with suppression of PVCs - Echo 10/21 45-50%  - No MRI with hip replacements and size - Stable NYHA II Functional status limited by orthopedic issues - Continue carvedilol 12.5 bid  - Continue Entresto 97/103 mg  twice a day.    - Continue spiro 12.5 mg daily. - Continue Farxiga 10  - repeat echo  - Labs today  2. Minimal nonobstructive CAD:  - LHC 12/27: 30% Prox LAD - No s/s ischemia - Continue crestor.  - No ASA with Xarelto use   3. DVT - Continue Xarelto 20 mg dailly. No bleeding.    4. OSA:  - Continue CPAP. She is complaint   5. COPD:  - Remains quit    6. NSVT/PVCs  - Zio Patch placed having 10% PVC burden. Having at least 2 different morphologies.  - Started amio 200 mg twice a day on 12/05/2018.  - Has total PVC suppression on last visit with [facility] - Zio 11/21 3.1% PVCs; 185 runs of NSVT. With near normal EF not ICD candidate - Given young age would like to avoid long-term amio exposure. At last visit I switched amio to mexilitene. PVCs appear suppressed on ECG today. Will recheck echo and Zio   7. DM2 - continue Farxiga & Trulicity  8. Pre-op clearance for upcoming spinal stimulator placement - She is low  to moderate risk and can proceed without further w/u - Will need to hold Xarelto  for at least 3 days prior.     Glori Bickers, MD  08/05/2021 11:35 AM  Advanced Heart Failure [place] [address] Heart and Vascular [place] [address] [phone] (office) [phone] (fax)

## 2021-08-05 NOTE — Patient Instructions (Signed)
Medication Changes:  No Change  Lab Work:  Labs done today, your results will be available in MyChart, we will contact you for abnormal readings.   Testing/Procedures:  Your physician has requested that you have an echocardiogram. Echocardiography is a painless test that uses sound waves to create images of your heart. It provides your doctor with information about the size and shape of your heart and how well your hearts chambers and valves are working. This procedure takes approximately one hour. There are no restrictions for this procedure.  Your provider has recommended that  you wear a Zio Patch for 14 days.  This monitor will record your heart rhythm for our review.  IF you have any symptoms while wearing the monitor please press the button.  If you have any issues with the patch or you notice a red or orange light on it please call the company at [phone].  Once you remove the patch please mail it back to the company as soon as possible so we can get the results.    Referrals:  None   Special Instructions // Education:  None   Follow-Up in: 9 months ( September 2023) ** Call in August for Follow up**  At [facility], you and your health needs are our priority. We have a designated team specialized in the treatment of Heart Failure. This Care Team includes your primary Heart Failure Specialized Cardiologist (physician), Advanced Practice Providers (APPs- Physician Assistants and Nurse Practitioners), and Pharmacist who all work together to provide you with the care you need, when you need it.   You may see any of the following providers on your designated Care Team at your next follow up:  Dr Glori Bickers Dr Haynes Kerns, NP Lyda Jester, [place] [facility] [place], [place] Audry Riles, PharmD   Please be sure to bring in all your medications bottles to every appointment.   Need to Contact us:  If you have any  questions or concerns before your next appointment please send [place] a message through [place] or call our office at [phone].    TO LEAVE A MESSAGE FOR THE NURSE SELECT OPTION 2, PLEASE LEAVE A MESSAGE INCLUDING: YOUR NAME DATE OF BIRTH CALL BACK NUMBER REASON FOR CALL**this is important as we prioritize the call backs  YOU WILL RECEIVE A CALL BACK THE SAME DAY AS LONG AS YOU CALL BEFORE 4:00 PM

## 2021-08-19 ENCOUNTER — Other Ambulatory Visit (HOSPITAL_COMMUNITY): Payer: Self-pay | Admitting: Internal Medicine

## 2021-08-20 ENCOUNTER — Ambulatory Visit (HOSPITAL_COMMUNITY): Payer: BC Managed Care – PPO

## 2021-11-05 ENCOUNTER — Other Ambulatory Visit (HOSPITAL_COMMUNITY): Payer: Self-pay | Admitting: *Deleted

## 2021-11-05 MED ORDER — MEXILETINE HCL 200 MG PO CAPS: 200.0000 mg | ORAL_CAPSULE | Freq: Two times a day (BID) | ORAL | 6 refills | Status: DC

## 2022-01-27 ENCOUNTER — Other Ambulatory Visit (HOSPITAL_COMMUNITY): Payer: Self-pay | Admitting: Internal Medicine

## 2022-03-29 ENCOUNTER — Other Ambulatory Visit (HOSPITAL_COMMUNITY): Payer: Self-pay

## 2022-05-02 ENCOUNTER — Other Ambulatory Visit (HOSPITAL_COMMUNITY): Payer: Self-pay | Admitting: Internal Medicine

## 2022-05-11 ENCOUNTER — Telehealth (HOSPITAL_COMMUNITY): Payer: Self-pay

## 2022-05-11 NOTE — Telephone Encounter (Signed)
Received a medical clearance form from [facility] , requesting Cardiology clearance for the following procedure: Colonoscopy. Medical clearance form was signed by Dr. Glori Bickers and successfully faxed to [phone] on Wednesday, September 27,. Form will be scanned into patients chart.

## 2022-05-14 ENCOUNTER — Other Ambulatory Visit (HOSPITAL_COMMUNITY): Payer: Self-pay | Admitting: Internal Medicine

## 2022-06-07 ENCOUNTER — Other Ambulatory Visit (HOSPITAL_COMMUNITY): Payer: Self-pay | Admitting: Internal Medicine

## 2022-07-01 ENCOUNTER — Other Ambulatory Visit (HOSPITAL_COMMUNITY): Payer: Self-pay | Admitting: Internal Medicine

## 2022-08-09 ENCOUNTER — Other Ambulatory Visit (HOSPITAL_COMMUNITY): Payer: Self-pay | Admitting: Internal Medicine

## 2022-09-10 ENCOUNTER — Other Ambulatory Visit (HOSPITAL_COMMUNITY): Payer: Self-pay | Admitting: Internal Medicine

## 2022-10-14 ENCOUNTER — Other Ambulatory Visit (HOSPITAL_COMMUNITY): Payer: Self-pay | Admitting: Internal Medicine

## 2022-11-05 ENCOUNTER — Other Ambulatory Visit (HOSPITAL_COMMUNITY): Payer: Self-pay | Admitting: Internal Medicine

## 2022-11-15 ENCOUNTER — Other Ambulatory Visit (HOSPITAL_COMMUNITY): Payer: Self-pay | Admitting: Internal Medicine

## 2022-12-09 ENCOUNTER — Other Ambulatory Visit (HOSPITAL_COMMUNITY): Payer: Self-pay | Admitting: Internal Medicine

## 2022-12-26 ENCOUNTER — Other Ambulatory Visit (HOSPITAL_COMMUNITY): Payer: Self-pay | Admitting: Internal Medicine

## 2022-12-26 NOTE — Progress Notes (Signed)
Advanced Heart Failure Clinic Note  Date:  12/27/2022   ID:  Anna Rowe, DOB [birth year], MRN [id number]  Location: Home  Provider location: [place] [facility] Type of Visit: Established patient  PCP:  [facility]  Cardiologist:  None Primary HF: Anna Rowe  Chief Complaint: Heart Failure follow-up   History of Present Illness:  Anna Rowe is a 47 y.o. female with a history of morbid obesity, DM2, COPD with ongoing tobacco use, OSA noncompliant with CPAP, chronic DVT (diagnosed 2018) on Xarelto, severe OA s/p R TKR and R/L hip replacements.    Her dad had HF and received an LVAD, after a similar "viral cardiomyopathy" type picture   Admitted 12/19 with acute HF. Echo EF 15-20%. [facility] 12/27 showed marked volume overload and low output and minimal nonobstructive CAD. Diuresed on milrinone, and weaned off as tolerated as medications titrated.    Monitor 4/20 -> started on amio for PVC suppression  1. Predominant rhythm is sinus. Patient had a min HR of 71 bpm, max HR of 174 bpm, and avg HR of 98 bpm. 2. 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 Fifty episodes of brief NSVT up to 6 beats 4. Several patient-triggered events associated with either PVCs or normal sinus rhythm   Echo 5/20 EF 25-30%  Echo 9/20 EF EF 40-45% (I thought closer to ~50%)  Echo 10/21 45-50%   In 8/22 we switched amio to mexilitene to avoid long-term exposure to amio. F/u Zio 12/22 7.9% PVCs  Here for routine f/u. I last saw her in 12/22 - we ordered repeat echo at the time but not done. Says she isn't doing much. Can do ADLs without too much problem. Main issue is chronic pain. Has spinal stimulator. Occasional edema RLE. No orthopnea or PND. Compliant with meds.    Cardiac studies:   Cath 12/27 Findings: Ao = 99/75 (81) LV = 93/31 RA = 22 RV = 54/25 PA = 58/24  (41) PCW = 30 Fick cardiac output/index = 4.1/1.6 SVR = 1163 PVR = 2.6 FA sat = 89% PA sat = 42%, 44% Assessment: 1. 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 Severe NICM with LVEF ~ 15% 3. Marked volume overload with low output   Past Medical History:  Diagnosis Date   Arthritis    CHF (congestive heart failure) (HCC)    COPD (chronic obstructive pulmonary disease) (HCC)    Diabetes mellitus (HCC)    type 2    DVT (deep venous thrombosis) (HCC) dx 10-2016   RLE    HLD (hyperlipidemia)    on crestor   Pneumonia    09/2016   Sleep apnea    per patient ;been dx does not use CPAP does not tolerate   Past Surgical History:  Procedure Laterality Date   ACHILLES TENDON REPAIR     HERNIA REPAIR     x2; hiatal and umbilical    IVC FILTER INSERTION N/A 04/25/2017   Procedure: IVC Filter Insertion;  Surgeon: Nada Libman, MD;  [facility];  Service: Cardiovascular;  Laterality: N/A;   IVC FILTER REMOVAL N/A 10/24/2017   Procedure: IVC FILTER REMOVAL;  Surgeon: Nada Libman, MD;  Location: [facility];  Service: Cardiovascular;  Laterality: N/A;   JOINT REPLACEMENT     right hip 07/14/17 Dr. Magnus Ivan   KNEE ARTHROSCOPY Right 2015   plantar  RIGHT/LEFT HEART CATH AND CORONARY ANGIOGRAPHY N/A 08/10/2018   Procedure: RIGHT/LEFT HEART CATH AND CORONARY ANGIOGRAPHY;  Surgeon: Dolores Patty, MD;  [facility]: [facility];  Service: Cardiovascular;  Laterality: N/A;   TOTAL HIP ARTHROPLASTY Left 04/28/2017   Procedure: LEFT TOTAL HIP ARTHROPLASTY ANTERIOR APPROACH;  Surgeon: Kathryne Hitch, MD;  Location: [facility];  Service: Orthopedics;  Laterality: Left;   TOTAL HIP ARTHROPLASTY Right 07/14/2017   Procedure: RIGHT TOTAL HIP ARTHROPLASTY ANTERIOR APPROACH;  Surgeon: Kathryne Hitch, MD;  Location: [facility];  Service: Orthopedics;  Laterality: Right;   TOTAL KNEE ARTHROPLASTY Right 04/04/2016   Procedure: TOTAL KNEE ARTHROPLASTY;   Surgeon: Jodi Geralds, MD;  Location: [facility];  Service: Orthopedics;  Laterality: Right;     Current Outpatient Medications  Medication Sig Dispense Refill   amitriptyline (ELAVIL) 50 MG tablet Take 1 tablet (50 mg total) by mouth at bedtime. 30 tablet 2   carvedilol (COREG) 12.5 MG tablet TAKE 1 TABLET (12.5MG  TOTAL) BY MOUTH TWICE A DAY WITH MEALS 180 tablet 3   Cholecalciferol (VITAMIN D-3 PO) Take 50,000 Units by mouth once a week.     cyclobenzaprine (FLEXERIL) 10 MG tablet Take 10 mg by mouth 3 (three) times daily.  1   dapagliflozin propanediol (FARXIGA) 10 MG TABS tablet Take 1 tablet (10 mg total) by mouth daily before breakfast. Please call for office visit [phone] 30 tablet 0   docusate sodium (COLACE) 100 MG capsule Take 100 mg by mouth 2 (two) times daily.   6   Dulaglutide 0.75 MG/0.5ML SOPN Inject 0.75 mg into the skin once a week.     ENTRESTO 97-103 MG TAKE 1 TABLET BY MOUTH TWICE A DAY 60 tablet 0   gabapentin (NEURONTIN) 600 MG tablet Take 600 mg by mouth 4 (four) times daily.   3   ipratropium-albuterol (DUONEB) 0.5-2.5 (3) MG/3ML SOLN Take 3 mLs by nebulization as needed.     KLOR-CON M20 20 MEQ tablet TAKE 1 TABLET BY MOUTH EVERY DAY 30 tablet 8   levothyroxine (SYNTHROID) 50 MCG tablet Take 50 mcg by mouth daily.     Meloxicam 15 MG TBDP Take 15 mg by mouth daily.     metFORMIN (GLUCOPHAGE-XR) 500 MG 24 hr tablet Take 500 mg 2 (two) times daily by mouth.   3   mexiletine (MEXITIL) 200 MG capsule TAKE 1 CAPSULE BY MOUTH TWICE A DAY 180 capsule 3   mometasone-formoterol (DULERA) 200-5 MCG/ACT AERO Inhale 2 puffs into the lungs 2 (two) times daily. 2 Inhaler 0   naloxegol oxalate (MOVANTIK) 25 MG TABS tablet Take 25 mg by mouth daily.     oxyCODONE-acetaminophen (PERCOCET) 7.5-325 MG tablet Take 1 tablet by mouth in the morning, at noon, and at bedtime.     rosuvastatin (CRESTOR) 10 MG tablet Take 1 tablet (10 mg total) by mouth at bedtime. 30 tablet 5   spironolactone  (ALDACTONE) 25 MG tablet TAKE 0.5 TABLETS (12.5 MG TOTAL) BY MOUTH DAILY. NEEDS FOLLOW UP APPOINTMENT FOR ANYMORE REFILLS 45 tablet 1   tapentadol (NUCYNTA) 50 MG tablet Take 50 mg by mouth 2 (two) times daily.     XARELTO 20 MG TABS tablet TAKE 1 TABLET BY MOUTH EVERY DAY 30 tablet 11   No current facility-administered medications for this encounter.    Allergies:   Methocarbamol, Tramadol, and Bee venom   Social History:  The patient  reports that she quit smoking about 4 years ago. Her smoking use  included cigarettes. She has a 8.50 pack-year smoking history. She has never used smokeless tobacco. She reports that she does not drink alcohol and does not use drugs.   Family History:  The patient's family history includes Hypertension in her father, mother, and sister.   ROS:  Please see the history of present illness.   All other systems are personally reviewed and negative.   Vitals:   12/27/22 1344  BP: 122/80  Pulse: 96  SpO2: 98%  Weight: 122 kg (269 lb)    Exam:   General:  Well appearing. No resp difficulty HEENT: normal Neck: supple. no JVD. Carotids 2+ bilat; no bruits. No lymphadenopathy or thryomegaly appreciated. Cor: PMI nondisplaced. Regular rate & rhythm. No rubs, gallops or murmurs. Lungs: clear Abdomen: obese soft, nontender, nondistended. No hepatosplenomegaly. No bruits or masses. Good bowel sounds. Extremities: no cyanosis, clubbing, rash, edema Neuro: alert & orientedx3, cranial nerves grossly intact. moves all 4 extremities w/o difficulty. Affect pleasant  ECG: NSR 91 + PVCs + LVH Personally reviewed   Recent Labs: No results found for requested labs within last 365 days.  Personally reviewed   Wt Readings from Last 3 Encounters:  12/27/22 122 kg (269 lb)  08/05/21 129 kg (284 lb 6.4 oz)  04/06/21 125.3 kg (276 lb 3.2 oz)      ASSESSMENT AND PLAN:  1. Chronic systolic HF due to NICM.  Suspect possible PVC related vs viral or OSA  - Echo 12/25:  EF 15-20%, severe diffuse HK and septal dyskinesis, grade 2 DD, mod MR, LA moderately dilated, RV moderately dilated with moderately reduced function, PA peak pressure 35 mm Hg.  - Echo 5/20 EF 25-30% (improving with PVC suppression) - hold off on ICD eval for now - [facility] 12/19 showed marked volume overload with low output, severe NICM with EF 15%, and minimal non-obstructive CAD. She has had a hysterectomy .  - Echo 9/20 40-45% (I thought 50%)- markedly improved with suppression of PVCs - Echo 10/21 45-50%  - Stable NYHA II Functional status limited by orthopedic and pain  - Continue carvedilol 12.5 bid  - Continue Entresto 97/103 mg  twice a day.    - Continue spiro 12.5 mg daily. - Continue Farxiga 10  - Will proceed with cMRI to re-assess EF and look for infiltrative disease. Will need to turn spinal stimulator off prior to scan  - recent labs with PCP ok   2. Minimal nonobstructive CAD:  - LHC 12/27: 30% Prox LAD - No s/s ischemia - Continue crestor.  - No ASA with Xarelto use   3. DVT - Continue Xarelto 20 mg dailly. No bleeding.    4. OSA:  - Continue CPAP. She is complaint   5. COPD:  - Remains quit    6. NSVT/PVCs  - Zio Patch placed having 10% PVC burden. Having at least 2 different morphologies.  - Started amio 200 mg twice a day on 12/05/2018.  - Zio 11/21 3.1% PVCs; 185 runs of NSVT. With near normal EF not ICD candidate - Amio switched to mexilitene in 8/22 - F/u Zio 12/22 7.9% PVCs - If EF worsening on cMRI will need to requantify PVC burden on mexilitene   7. DM2 - continue Farxiga & Trulicity   [name], MD  12/27/2022 2:27 PM  Advanced Heart Failure Clinic [facility] [address] Heart and Vascular [place] [address] [phone] (office) [phone] (fax)

## 2022-12-27 ENCOUNTER — Ambulatory Visit (HOSPITAL_COMMUNITY)
Admission: RE | Admit: 2022-12-27 | Discharge: 2022-12-27 | Disposition: A | Discharge status: Home / Self Care | Payer: 59 | Source: Ambulatory Visit | Attending: Internal Medicine | Admitting: Internal Medicine

## 2022-12-27 VITALS — BP 122/80 | HR 96 | Wt 269.0 lb

## 2022-12-27 DIAGNOSIS — G8929 Other chronic pain: Secondary | ICD-10-CM | POA: Insufficient documentation

## 2022-12-27 DIAGNOSIS — I428 Other cardiomyopathies: Secondary | ICD-10-CM | POA: Diagnosis not present

## 2022-12-27 DIAGNOSIS — Z79899 Other long term (current) drug therapy: Secondary | ICD-10-CM | POA: Insufficient documentation

## 2022-12-27 DIAGNOSIS — Z7901 Long term (current) use of anticoagulants: Secondary | ICD-10-CM | POA: Diagnosis not present

## 2022-12-27 DIAGNOSIS — Z7985 Long-term (current) use of injectable non-insulin antidiabetic drugs: Secondary | ICD-10-CM | POA: Diagnosis not present

## 2022-12-27 DIAGNOSIS — G4733 Obstructive sleep apnea (adult) (pediatric): Secondary | ICD-10-CM | POA: Diagnosis not present

## 2022-12-27 DIAGNOSIS — I2510 Atherosclerotic heart disease of native coronary artery without angina pectoris: Secondary | ICD-10-CM | POA: Insufficient documentation

## 2022-12-27 DIAGNOSIS — Z7984 Long term (current) use of oral hypoglycemic drugs: Secondary | ICD-10-CM | POA: Insufficient documentation

## 2022-12-27 DIAGNOSIS — E119 Type 2 diabetes mellitus without complications: Secondary | ICD-10-CM | POA: Diagnosis not present

## 2022-12-27 DIAGNOSIS — Z7951 Long term (current) use of inhaled steroids: Secondary | ICD-10-CM | POA: Diagnosis not present

## 2022-12-27 DIAGNOSIS — Z86718 Personal history of other venous thrombosis and embolism: Secondary | ICD-10-CM | POA: Diagnosis not present

## 2022-12-27 DIAGNOSIS — Z79890 Hormone replacement therapy (postmenopausal): Secondary | ICD-10-CM | POA: Diagnosis not present

## 2022-12-27 DIAGNOSIS — Z791 Long term (current) use of non-steroidal anti-inflammatories (NSAID): Secondary | ICD-10-CM | POA: Insufficient documentation

## 2022-12-27 DIAGNOSIS — J449 Chronic obstructive pulmonary disease, unspecified: Secondary | ICD-10-CM | POA: Insufficient documentation

## 2022-12-27 DIAGNOSIS — I493 Ventricular premature depolarization: Secondary | ICD-10-CM

## 2022-12-27 DIAGNOSIS — Z8249 Family history of ischemic heart disease and other diseases of the circulatory system: Secondary | ICD-10-CM | POA: Diagnosis not present

## 2022-12-27 DIAGNOSIS — E785 Hyperlipidemia, unspecified: Secondary | ICD-10-CM | POA: Diagnosis not present

## 2022-12-27 DIAGNOSIS — Z87891 Personal history of nicotine dependence: Secondary | ICD-10-CM | POA: Insufficient documentation

## 2022-12-27 DIAGNOSIS — I472 Ventricular tachycardia, unspecified: Secondary | ICD-10-CM | POA: Insufficient documentation

## 2022-12-27 DIAGNOSIS — I5022 Chronic systolic (congestive) heart failure: Secondary | ICD-10-CM

## 2022-12-27 LAB — CBC
HCT: 48 % — ABNORMAL HIGH (ref 36.0–46.0)
Hemoglobin: 14.9 g/dL (ref 12.0–15.0)
MCH: 22.9 pg — ABNORMAL LOW (ref 26.0–34.0)
MCHC: 31 g/dL (ref 30.0–36.0)
MCV: 73.7 fL — ABNORMAL LOW (ref 80.0–100.0)
Platelets: 372 10*3/uL (ref 150–400)
RBC: 6.51 MIL/uL — ABNORMAL HIGH (ref 3.87–5.11)
RDW: 17.1 % — ABNORMAL HIGH (ref 11.5–15.5)
WBC: 9.5 10*3/uL (ref 4.0–10.5)
nRBC: 0 % (ref 0.0–0.2)

## 2022-12-27 NOTE — Addendum Note (Signed)
Encounter addended by: Linda Hedges, RN on: 12/27/2022 2:38 PM  Actions taken: Actions taken from a BestPractice Advisory, Order list changed, Diagnosis association updated, Clinical Note Signed

## 2022-12-27 NOTE — Patient Instructions (Signed)
No changes to your medications.  Labs done today, your results will be available in MyChart, we will contact you for abnormal readings.  Your physician has requested that you have a cardiac MRI. Cardiac MRI uses a computer to create images of your heart as its beating, producing both still and moving pictures of your heart and major blood vessels. For further information please visit [email or web address]. Please follow the instruction sheet given to you today for more information. ONCE APPROVED BY YOUR INSURANCE COMPANY YOU WILL BE CALLED TO HAVE THE TEST ARRANGED.  Your physician recommends that you schedule a follow-up appointment in: 9 months (February 2025) ** please call the office in November to arrange your follow up appointment.**  If you have any questions or concerns before your next appointment please send [place] a message through [place] or call our office at [phone].    TO LEAVE A MESSAGE FOR THE NURSE SELECT OPTION 2, PLEASE LEAVE A MESSAGE INCLUDING: YOUR NAME DATE OF BIRTH CALL BACK NUMBER REASON FOR CALL**this is important as we prioritize the call backs  YOU WILL RECEIVE A CALL BACK THE SAME DAY AS LONG AS YOU CALL BEFORE 4:00 PM  At the [facility], you and your health needs are our priority. As part of our continuing mission to provide you with exceptional heart care, we have created designated Provider Care Teams. These Care Teams include your primary Cardiologist (physician) and Advanced Practice Providers (APPs- Physician Assistants and Nurse Practitioners) who all work together to provide you with the care you need, when you need it.   You may see any of the following providers on your designated Care Team at your next follow up: Dr [name] Dr Marca Ancona Dr. Marcos Eke, NP Robbie Lis, [place] [facility] [place], [place] Brynda Peon, NP Karle Plumber, PharmD   Please be sure to bring in all your medications  bottles to every appointment.    Thank you for choosing [facility]

## 2023-01-11 ENCOUNTER — Other Ambulatory Visit (HOSPITAL_COMMUNITY): Payer: Self-pay | Admitting: Internal Medicine

## 2023-01-16 ENCOUNTER — Telehealth (HOSPITAL_COMMUNITY): Payer: Self-pay | Admitting: Cardiology

## 2023-01-16 NOTE — Telephone Encounter (Signed)
Reports meds were taken at 0600 Vitals HR 35-45 b/p  97/78 taken at 1000  Reports syncopal episode last week at home -took medications at 700 -went to make breakfast around 1000 was found on the floor by husband  Please advise

## 2023-01-16 NOTE — Telephone Encounter (Signed)
Patient called during visit with pain management clinic to report HR 35-45 b/p 97/85  As pt was active in the office with provider during the time of the call, will return call to further assess

## 2023-01-17 ENCOUNTER — Ambulatory Visit (HOSPITAL_COMMUNITY)
Admission: RE | Admit: 2023-01-17 | Discharge: 2023-01-17 | Disposition: A | Discharge status: Home / Self Care | Payer: 59 | Source: Ambulatory Visit | Attending: Cardiology | Admitting: Cardiology

## 2023-01-17 ENCOUNTER — Inpatient Hospital Stay (HOSPITAL_COMMUNITY)
Admission: RE | Admit: 2023-01-17 | Discharge: 2023-01-17 | Disposition: A | Discharge status: Home / Self Care | Payer: Medicare PPO | Source: Ambulatory Visit | Attending: Internal Medicine | Admitting: Internal Medicine

## 2023-01-17 VITALS — BP 100/64 | HR 81 | Wt 266.8 lb

## 2023-01-17 DIAGNOSIS — R001 Bradycardia, unspecified: Secondary | ICD-10-CM | POA: Insufficient documentation

## 2023-01-17 NOTE — Progress Notes (Signed)
Pt present to clinic for nurse visit EKG completed and ZioAT placed as ordered per Dr Gala Romney

## 2023-01-17 NOTE — Telephone Encounter (Signed)
Pt aware Nurse visit 6/4 @ 1130

## 2023-01-19 ENCOUNTER — Other Ambulatory Visit (HOSPITAL_COMMUNITY): Payer: Self-pay | Admitting: Internal Medicine

## 2023-02-07 NOTE — Addendum Note (Signed)
Encounter addended by: Crissie Figures, RN on: 02/07/2023 2:50 PM  Actions taken: Imaging Exam ended

## 2023-02-15 ENCOUNTER — Other Ambulatory Visit (HOSPITAL_COMMUNITY): Payer: Self-pay | Admitting: Internal Medicine

## 2023-02-24 ENCOUNTER — Telehealth (HOSPITAL_COMMUNITY): Payer: Self-pay | Admitting: Cardiology

## 2023-02-24 DIAGNOSIS — I493 Ventricular premature depolarization: Secondary | ICD-10-CM

## 2023-02-24 NOTE — Telephone Encounter (Signed)
Patient called.  Patient aware.  

## 2023-02-24 NOTE — Telephone Encounter (Signed)
-----   Message from [name] sent at 02/24/2023 11:16 AM EDT ----- Very frequent PVCs. Refer back to EP please.

## 2023-03-16 ENCOUNTER — Other Ambulatory Visit (HOSPITAL_COMMUNITY): Payer: Self-pay | Admitting: Internal Medicine

## 2023-03-16 ENCOUNTER — Telehealth (HOSPITAL_COMMUNITY): Payer: Self-pay | Admitting: *Deleted

## 2023-03-16 NOTE — Telephone Encounter (Signed)
Attempted to call patient regarding upcoming cardiac MRI appointment. Left message on voicemail with name and callback number  Merle Prescott RN Navigator Cardiac Imaging [facility] [phone] Office [phone] Cell  

## 2023-03-17 ENCOUNTER — Ambulatory Visit (HOSPITAL_COMMUNITY)
Admission: RE | Admit: 2023-03-17 | Discharge: 2023-03-17 | Disposition: A | Discharge status: Home / Self Care | Payer: PRIVATE HEALTH INSURANCE | Source: Ambulatory Visit | Attending: Internal Medicine | Admitting: Internal Medicine

## 2023-03-17 ENCOUNTER — Other Ambulatory Visit (HOSPITAL_COMMUNITY): Payer: Self-pay | Admitting: Internal Medicine

## 2023-03-17 ENCOUNTER — Ambulatory Visit (HOSPITAL_COMMUNITY): Admission: RE | Admit: 2023-03-17 | Payer: 59 | Source: Ambulatory Visit

## 2023-03-17 DIAGNOSIS — Z0189 Encounter for other specified special examinations: Secondary | ICD-10-CM | POA: Insufficient documentation

## 2023-03-17 DIAGNOSIS — I493 Ventricular premature depolarization: Secondary | ICD-10-CM | POA: Insufficient documentation

## 2023-03-17 MED ORDER — GADOBUTROL 1 MMOL/ML IV SOLN
10.0000 mL | Freq: Once | INTRAVENOUS | Status: AC | PRN
  Administered 2023-03-17: 10 mL via INTRAVENOUS

## 2023-03-28 ENCOUNTER — Ambulatory Visit (INDEPENDENT_AMBULATORY_CARE_PROVIDER_SITE_OTHER): Payer: PRIVATE HEALTH INSURANCE | Admitting: Physician Assistant

## 2023-03-28 ENCOUNTER — Other Ambulatory Visit: Payer: Self-pay

## 2023-03-28 ENCOUNTER — Encounter: Payer: Self-pay | Admitting: Physician Assistant

## 2023-03-28 DIAGNOSIS — M25512 Pain in left shoulder: Secondary | ICD-10-CM | POA: Diagnosis not present

## 2023-03-28 NOTE — Progress Notes (Signed)
HPI: Mrs. Quick hopes 47 year old female well-known to Dr. Raye Sorrow service.  However she has not been seen in our office since 2019.  She comes in today with a complaint of left arm pain.  She reports a burning sensation points axillary region down to her elbow.  She has increased pain if she keeps the arm straight.  She has to keep the arm across to her chest.  Pain does awaken her.  She does note that it bothers her to bring her arm up overhead.  She had a fall in June of this year and landed on her left side and has had pain in the arm since then.  She has had no treatment prior to this.  She has no pain that radiates further down the arm.  She is on chronic oxycodone, Flexeril, meloxicam and gabapentin.  She denies any neck pain.  She is diabetic reports last hemoglobin A1c was 8.6.  She states she is unable to get her medications due to there being a shortage.  She feels like her diabetes is under better controlled and she is now back on all of her diabetes medications.  Review of systems: See HPI otherwise negative  Physical exam: General Well-developed well-nourished no acute distress.  Mood and affect appropriate  Upper extremity: She has 5 out of 5 strengths throughout the upper extremities against resistance.  She has diminished forward flexion of the left arm both passively and actively coming to only about 120 -130 degrees secondary to pain.  Unable to do liftoff test on the left due to pain.  Liftoff test is negative on the right.  Impingement testing positive on the left.  Full range of motion bilateral hands.  Sensation grossly intact throughout bilateral hands light touch.  Dorsal pedal pulses are 2+ and equal symmetric.  No significant tenderness over the biceps muscle belly of either arm.  Distal biceps intact bilaterally.  Cervical spine: Full range of motion without pain.  Spurling's is negative.  No tenderness along medial border of scapula bilaterally.   Radiographs: 3 views left  shoulder: Shoulder is well located.  Glenohumeral joint is well-maintained.  Moderate AC joint arthritic changes.  No acute fractures or acute findings.   Impression: Left shoulder/impingement Differential: Brachial plexus injury   Plan: This point time we will send her to formal physical therapy to work on range of motion of the left shoulder strengthening left shoulder.  They will include modalities home exercise program.  She will see [place] back in 5 weeks see how she is doing overall.  Did not recommend cortisone injection into the subacromial space given patient's poorly controlled diabetes.  Hopefully 5 weeks she will have undergone a new hemoglobin A1c test and glucose levels will be better controlled.  Questions were encouraged and answered.  She will continue her current medications.

## 2023-03-30 ENCOUNTER — Other Ambulatory Visit: Payer: Self-pay

## 2023-03-30 DIAGNOSIS — M25512 Pain in left shoulder: Secondary | ICD-10-CM

## 2023-04-04 ENCOUNTER — Ambulatory Visit: Payer: PRIVATE HEALTH INSURANCE | Attending: Cardiology | Admitting: Cardiology

## 2023-04-04 ENCOUNTER — Encounter: Payer: Self-pay | Admitting: Cardiology

## 2023-04-04 VITALS — BP 120/70 | HR 97 | Ht 72.0 in | Wt 266.0 lb

## 2023-04-04 DIAGNOSIS — I5022 Chronic systolic (congestive) heart failure: Secondary | ICD-10-CM

## 2023-04-04 DIAGNOSIS — I493 Ventricular premature depolarization: Secondary | ICD-10-CM

## 2023-04-04 DIAGNOSIS — R001 Bradycardia, unspecified: Secondary | ICD-10-CM

## 2023-04-04 NOTE — Patient Instructions (Signed)
Medication Instructions:  Your physician recommends that you continue on your current medications as directed. Please refer to the Current Medication list given to you today.  *If you need a refill on your cardiac medications before your next appointment, please call your pharmacy*   Lab Work: None ordered If you have labs (blood work) drawn today and your tests are completely normal, you will receive your results only by: MyChart Message (if you have MyChart) OR A paper copy in the mail If you have any lab test that is abnormal or we need to change your treatment, we will call you to review the results.   Testing/Procedures: Your physician has requested that you have an echocardiogram. Echocardiography is a painless test that uses sound waves to create images of your heart. It provides your doctor with information about the size and shape of your heart and how well your heart's chambers and valves are working. This procedure takes approximately one hour. There are no restrictions for this procedure. Please do NOT wear cologne, perfume, aftershave, or lotions (deodorant is allowed). Please arrive 15 minutes prior to your appointment time.  Your physician has recommended that you have an ablation. Catheter ablation is a medical procedure used to treat some cardiac arrhythmias (irregular heartbeats). During catheter ablation, a long, thin, flexible tube is put into a blood vessel in your groin (upper thigh), or neck. This tube is called an ablation catheter. It is then guided to your heart through the blood vessel. Radio frequency waves destroy small areas of heart tissue where abnormal heartbeats may cause an arrhythmia to start. Please see the instruction sheet given to you today.  You are scheduled for 06/05/2023.  The EP procedure scheduler will be in touch to arrange  pre procedure blood work and procedure instructions.     Follow-Up: At [facility], you and your health needs are our  priority.  As part of our continuing mission to provide you with exceptional heart care, we have created designated Provider Care Teams.  These Care Teams include your primary Cardiologist (physician) and Advanced Practice Providers (APPs -  Physician Assistants and Nurse Practitioners) who all work together to provide you with the care you need, when you need it.  We recommend signing up for the patient portal called "MyChart".  Sign up information is provided on this After Visit Summary.  MyChart is used to connect with patients for Virtual Visits (Telemedicine).  Patients are able to view lab/test results, encounter notes, upcoming appointments, etc.  Non-urgent messages can be sent to your provider as well.   To learn more about what you can do with MyChart, go to [email or web address].    Your next appointment:   1 month(s) after your ablation  The format for your next appointment:   In Person  Provider:   Loman Brooklyn, MD{   Thank you for choosing [facility]!!   Dory Horn, RN [phone]  Other Instructions  Cardiac Ablation Cardiac ablation is a procedure to destroy (ablate) heart tissue that is sending bad signals. These bad signals cause the heart to beat very fast or in a way that is not normal. Destroying some tissues can help make the heart rhythm normal. Tell your doctor about: Any allergies you have. All medicines you are taking. These include vitamins, herbs, eye drops, creams, and over-the-counter medicines. Any problems you or family members have had with anesthesia. Any bleeding problems you have. Any surgeries you have had. Any medical conditions you have. Whether  you are pregnant or may be pregnant. What are the risks? Your doctor will talk with you about risks. These may include: Infection. Bruising and bleeding. Stroke or blood clots. Damage to nearby areas of your body. Allergies to medicines or dyes. Needing a pacemaker if the heart gets  damaged. A pacemaker helps the heart beat normally. The procedure not working. What happens before the procedure? Medicines Ask your doctor about changing or stopping: Your normal medicines. Vitamins, herbs, and supplements. Over-the-counter medicines. Do not take aspirin or ibuprofen unless you are told to. General instructions Follow instructions from your doctor about what you may eat and drink. If you will be going home right after the procedure, plan to have a responsible adult: Take you home from the hospital or clinic. You will not be allowed to drive. Care for you for the time you are told. Ask your doctor what steps will be taken to prevent the spread of germs. What happens during the procedure?  An IV tube will be put into one of your veins. You may be given: A sedative. This helps you relax. Anesthesia. This will: Numb certain areas of your body. The skin on your neck or groin will be numbed. A cut (incision) will be made in your neck or groin. A needle will be put through the cut and into a large vein. The small, thin tube (catheter) will be put into the needle. The tube will be moved to your heart. A type of X-ray (fluoroscopy) will be used to help guide the tube. It will also show constant images of the heart on a screen. Dye may be put through the tube. This helps your doctor see your heart. An electric current will be sent from the tube to destroy heart tissue in certain areas. The tube will be taken out. Pressure will be held on your cut. This helps stop bleeding. A bandage (dressing) will be put over your cut. The procedure may vary among doctors and hospitals. What happens after the procedure? You will be monitored until you leave the hospital or clinic. This includes checking your blood pressure, heart rate and rhythm, breathing rate, and blood oxygen level. Your cut will be checked for bleeding. You will need to lie still for a few hours. If your groin was  used, you will need to keep your leg straight for a few hours after the small, thin tube is removed. This information is not intended to replace advice given to you by your health care provider. Make sure you discuss any questions you have with your health care provider. Document Revised: 01/18/2022 Document Reviewed: 01/18/2022 Elsevier Patient Education  2024 [employer].

## 2023-04-04 NOTE — Progress Notes (Signed)
Electrophysiology Office Note:   Date:  04/04/2023  ID:  Anna Rowe, Anna Rowe April 02, 1976, MRN [id number]  Primary Cardiologist: None Electrophysiologist: None      History of Present Illness:   Anna Rowe is a 47 y.o. female with h/o heart failure, PVCs seen today for  for Electrophysiology evaluation of chronic systolic heart failure at the request of Dan Bensimhon.     Today she is doing well.  She does have palpitations that occur on a weekly basis.  She says sometimes, her palpitations are quite rapid.  She has had 2 episodes of syncope during the time of palpitations.  When she has her palpitations, she feels fatigued and mildly short of breath.  She has a history stated for morbid obesity, type 2 diabetes, COPD with ongoing tobacco abuse, OSA noncompliant with CPAP, DVTs.  She was admitted to the hospital in 2019 with acute heart failure and ejection fraction of 15 to 20%.  Cardiac monitor showed an elevated PVC burden and was started on amiodarone for PVC suppression.  She was switched to mexiletine.  Initially she had 10% PVCs, with mexiletine PVC burden dropped to 7.9%.      Review of systems complete and found to be negative unless listed in HPI.   EP Information / Studies Reviewed:    EKG is ordered today. Personal review as below.  EKG Interpretation Date/Time:  Tuesday April 04 2023 10:38:02 EDT Ventricular Rate:  97 PR Interval:  168 QRS Duration:  108 QT Interval:  366 QTC Calculation: 464 R Axis:   -31  Text Interpretation: Sinus rhythm with occasional Premature ventricular complexes Left axis deviation Moderate voltage criteria for LVH, may be normal variant ( R in aVL , Cornell product ) When compared with ECG of 17-Jan-2023 11:16, Premature ventricular complexes are now Present Confirmed by [employer], [place] ([id number]) on 04/04/2023 10:45:15 AM     Risk Assessment/Calculations:              Physical Exam:   VS:  BP 120/70 (BP  Location: Left Arm, Patient Position: Sitting, Cuff Size: Large)   Pulse 97   Ht 6' (1.829 m)   Wt 266 lb (120.7 kg)   BMI 36.08 kg/m    Wt Readings from Last 3 Encounters:  04/04/23 266 lb (120.7 kg)  01/17/23 266 lb 12.8 oz (121 kg)  12/27/22 269 lb (122 kg)     GEN: Well nourished, well developed in no acute distress NECK: No JVD; No carotid bruits CARDIAC: Regular rate and rhythm, no murmurs, rubs, gallops RESPIRATORY:  Clear to auscultation without rales, wheezing or rhonchi  ABDOMEN: Soft, non-tender, non-distended EXTREMITIES:  No edema; No deformity   ASSESSMENT AND PLAN:    1.  Chronic [place] heart failure: Due to ischemic cardiomyopathy.  Currently on optimal medical therapy per primary cardiology.  Ejection fraction is reduced, though not well seen on most recent cardiac MRI.  She has not had an echo in a few years.  Samyuktha Brau repeat her echo as she has had episodes of syncope, concerning for ventricular arrhythmias.    2.  PVCs: Elevated burden at 20%.  I discussed with her the options of adjusting to sotalol, increasing mexiletine, or ablation.  She would prefer to avoid long-term medications.  Due to that, we Tracye Szuch plan for ablation.  Risk and benefits have been discussed.  Risk include bleeding, tamponade, heart block, stroke, damage to chest organs, renal failure, MI, death.  She understands risks and is agreed  to the procedure.  Due to her history of syncope, we Tyberius Ryner do a full EP study at the time of ablation.  Plan discussed with primary cardiology.  3.  Obstructive sleep apnea: CPAP compliance encouraged  4.  DVTs: Continue Xarelto  5.  COPD: Tobacco cessation encouraged  6.  Obesity: Lifestyle modification encouraged  Follow up with Dr. Elberta Fortis as usual post procedure  Signed, Laquan Ludden Jorja Loa, MD

## 2023-04-20 ENCOUNTER — Ambulatory Visit (HOSPITAL_COMMUNITY): Payer: Managed Care, Other (non HMO) | Attending: Internal Medicine

## 2023-04-20 DIAGNOSIS — I5022 Chronic systolic (congestive) heart failure: Secondary | ICD-10-CM | POA: Diagnosis present

## 2023-04-20 DIAGNOSIS — I493 Ventricular premature depolarization: Secondary | ICD-10-CM | POA: Insufficient documentation

## 2023-04-20 LAB — ECHOCARDIOGRAM COMPLETE
Area-P 1/2: 4.57 cm2
S' Lateral: 4 cm

## 2023-04-30 ENCOUNTER — Other Ambulatory Visit (HOSPITAL_COMMUNITY): Payer: Self-pay | Admitting: Internal Medicine

## 2023-05-02 NOTE — Therapy (Signed)
OUTPATIENT PHYSICAL THERAPY SHOULDER EVALUATION   Patient Name: Anna Rowe MRN: [id number] DOB:September 30, 1975, 47 y.o., female Today's Date: 05/03/2023  END OF SESSION:  PT End of Session - 05/03/23 1522     Visit Number 1    Date for PT Re-Evaluation 06/28/23    Authorization Type Generic commercial and [facility]- requested 12 visits 9/18-11/13/24    Progress Note Due on Visit 10    PT Start Time 1523    PT Stop Time 1606    PT Time Calculation (min) 43 min    Activity Tolerance Patient tolerated treatment well    Behavior During Therapy WFL for tasks assessed/performed             Past Medical History:  Diagnosis Date   Arthritis    CHF (congestive heart failure) (HCC)    COPD (chronic obstructive pulmonary disease) (HCC)    Diabetes mellitus (HCC)    type 2    DVT (deep venous thrombosis) (HCC) dx 10-2016   RLE    HLD (hyperlipidemia)    on crestor   Pneumonia    09/2016   Sleep apnea    per patient ;been dx does not use CPAP does not tolerate   Past Surgical History:  Procedure Laterality Date   ACHILLES TENDON REPAIR     HERNIA REPAIR     x2; hiatal and umbilical    IVC FILTER INSERTION N/A 04/25/2017   Procedure: IVC Filter Insertion;  Surgeon: Nada Libman, MD;  [facility];  Service: Cardiovascular;  Laterality: N/A;   IVC FILTER REMOVAL N/A 10/24/2017   Procedure: IVC FILTER REMOVAL;  Surgeon: Nada Libman, MD;  [facility]: [facility];  Service: Cardiovascular;  Laterality: N/A;   JOINT REPLACEMENT     right hip 07/14/17 Dr. Magnus Ivan   KNEE ARTHROSCOPY Right 2015   plantar     RIGHT/LEFT HEART CATH AND CORONARY ANGIOGRAPHY N/A 08/10/2018   Procedure: RIGHT/LEFT HEART CATH AND CORONARY ANGIOGRAPHY;  Surgeon: Dolores Patty, MD;  [facility]: [facility];  Service: Cardiovascular;  Laterality: N/A;   TOTAL HIP ARTHROPLASTY Left 04/28/2017   Procedure: LEFT TOTAL HIP ARTHROPLASTY ANTERIOR APPROACH;   Surgeon: Kathryne Hitch, MD;  Location: [facility];  Service: Orthopedics;  Laterality: Left;   TOTAL HIP ARTHROPLASTY Right 07/14/2017   Procedure: RIGHT TOTAL HIP ARTHROPLASTY ANTERIOR APPROACH;  Surgeon: Kathryne Hitch, MD;  Location: [facility];  Service: Orthopedics;  Laterality: Right;   TOTAL KNEE ARTHROPLASTY Right 04/04/2016   Procedure: TOTAL KNEE ARTHROPLASTY;  Surgeon: Jodi Geralds, MD;  Location: [facility];  Service: Orthopedics;  Laterality: Right;   Patient Active Problem List   Diagnosis Date Noted   Bradycardia 01/17/2023   Chronic systolic (congestive) heart failure (HCC) 11/08/2018   CHF (congestive heart failure) (HCC) 08/09/2018   Acute diastolic CHF (congestive heart failure) (HCC) 08/08/2018   DM2 (diabetes mellitus, type 2) (HCC) 08/08/2018   History of DVT (deep vein thrombosis) 08/08/2018   Unilateral primary osteoarthritis, left knee 08/24/2017   History of total right knee replacement 08/24/2017   Chronic bilateral low back pain 08/24/2017   Status post total replacement of right hip 07/14/2017   Status post total replacement of left hip 04/28/2017   Unilateral primary osteoarthritis, left hip 03/09/2017   Unilateral primary osteoarthritis, right hip 03/09/2017   Primary osteoarthritis of right knee 04/04/2016    [facility]   REFERRING PROVIDER: Kirtland Bouchard, PA-C  REFERRING DIAG: M25.512 (ICD-10-CM) - Acute pain of left shoulder   THERAPY DIAG:  Acute pain of left shoulder  Stiffness of left shoulder, not elsewhere classified  Cramp and spasm  Muscle weakness (generalized)  Rationale for Evaluation and Treatment: Rehabilitation  ONSET DATE: 01/02/23  SUBJECTIVE:                                                                                                                                                                                      SUBJECTIVE STATEMENT: Patient fell on 01/02/23. Her R knee gave out in the  shower and she hurt her left shoulder trying to catch herself. She also reports R UE numbness. Pain is worse with lying flat. Had a recent MRI for heart and the shoulder pain was very bad. Pushing down into cane also hurts, washing her back.  Hand dominance: Right  PERTINENT HISTORY: Spinal cord stimulator for LBP, DM, B THA, R TKR, CHF, DVT RLE, sleep apnea  PAIN:  Are you having pain? Yes: NPRS scale: 9/10 Pain location: L shoulder (indicates deltoid region) Pain description: sharp and stabbing Aggravating factors: lying on back, reaching behind back, horizontal ABD Relieving factors: rest  PRECAUTIONS: Fall  RED FLAGS: None   WEIGHT BEARING RESTRICTIONS: No  FALLS:  Has patient fallen in last 6 months? Yes. Number of falls 5 R  leg goes numb and gives out intermittently.  LIVING ENVIRONMENT: Lives with: lives with their spouse Lives in: House/apartment  OCCUPATION: Does not work; on disability  PLOF: Independent  PATIENT GOALS:get rid of pain  NEXT MD VISIT:   OBJECTIVE:   DIAGNOSTIC FINDINGS:  XR - negative  PATIENT SURVEYS:  Quick Dash 70.5 / 100 = 70.5 %  COGNITION: Overall cognitive status: Within functional limits for tasks assessed     SENSATION: WFL  POSTURE: Forward head, rounded shoulders, post pelvic tilt  UPPER EXTREMITY ROM: *pain  A/P ROM Right eval Left eval  Shoulder flexion  153/158*  Shoulder extension  60  Shoulder abduction  155/180*  Shoulder adduction    Shoulder internal rotation  55/62*  Shoulder external rotation  full  Elbow flexion    Elbow extension    Wrist flexion    Wrist extension    Wrist ulnar deviation    Wrist radial deviation    Wrist pronation    Wrist supination    (Blank rows = not tested)  UPPER EXTREMITY MMT:  MMT Right eval Left eval  Shoulder flexion  5  Shoulder extension  4+  Shoulder abduction  4  Shoulder adduction    Shoulder internal rotation  5  Shoulder external rotation  4  Middle trapezius    Lower trapezius    (Blank rows = not tested)  SHOULDER SPECIAL TESTS: Impingement tests: Neer impingement test: negative and Hawkins/Kennedy impingement test: negative Instability tests: Apprehension test: negative Rotator cuff assessment: Drop arm test: negative, Empty can test: positive , Full can test: negative, Gerber lift off test: negative, and Belly press test: positive    JOINT MOBILITY TESTING:  No pain with joint mobility testing  PALPATION:  L supraspinatus near acromion, subscapularis and infraspinatus TTP   TODAY'S TREATMENT:                                                                                                                                         DATE:  05/03/23  Self care: use of rolled towel under humerus when lying on back to maintain joint position; postural ed; TE: reviewed current HEP from MD and added scaption/ABD wall slides standing pain free range   PATIENT EDUCATION: Education details: PT eval findings, anticipated POC, HEP review, postural awareness, posture and body mechanics for typical daily postioning, mobility and household tasks, role of DN, and Ionto patch wearing instructions  Person educated: Patient Education method: Explanation, Demonstration, and Handouts Education comprehension: verbalized understanding and returned demonstration  HOME EXERCISE PROGRAM: To be established; reviewed MD issued program and added scaption/ABD wall slides.  ASSESSMENT:  CLINICAL IMPRESSION: Patient is a 47 y.o. female who was seen today for physical therapy evaluation and treatment for left shoulder pain beginning 4 months ago after a fall in the shower. Patient demonstrates deficits in L shoulder strength and ROM and has pain mainly with lying supine, horizontal ABD and IR.  She is able to lie without pain when humerus is supported. Her shoulder internal rotators are tender but strong and she has pain with special tests of these  muscles. She has full ER, but ER is weak with MMT. Joint mobility is WNL and not painful. She experienced one episode of her shoulder popping when extending arm from ABDucted position in supine. This does not occur in standing. Patient will benefit from skilled PT to address these deficits.    OBJECTIVE IMPAIRMENTS: decreased activity tolerance, decreased ROM, decreased strength, increased muscle spasms, impaired flexibility, impaired UE functional use, postural dysfunction, and pain.   ACTIVITY LIMITATIONS: sleeping, bathing, dressing, and reaching outward horizontally  PARTICIPATION LIMITATIONS: cleaning  PERSONAL FACTORS: Fitness, Time since onset of injury/illness/exacerbation, and 1 comorbidity: DM  are also affecting patient's functional outcome.   REHAB POTENTIAL: Good  CLINICAL DECISION MAKING: Evolving/moderate complexity  EVALUATION COMPLEXITY: Low   GOALS: Goals reviewed with patient? Yes  SHORT TERM GOALS: Target date: 05/24/2023   Patient will be independent with initial HEP.  Baseline:  Goal status: INITIAL   LONG TERM GOALS: Target date: 06/28/2023   Patient will be independent with advanced/ongoing HEP to improve outcomes and carryover.  Baseline:  Goal status: INITIAL  2.  Patient will report 75% improvement in L shoulder pain to improve QOL.  Baseline:  Goal status: INITIAL  3.  Patient to demonstrate improved upright posture with posterior shoulder girdle engaged to promote improved glenohumeral joint mobility. Baseline:  Goal status: INITIAL  4.  Patient to improve L shoulder AROM to [facility] without pain provocation to allow for increased ease of ADLs.  Baseline:  Goal status: INITIAL  5.  Patient will demonstrate improved  UE strength to 5/5 to normalize ADLS. Baseline:  Goal status: INITIAL  6  Patient will report 60 on Quick Dash to demonstrate improved functional ability.  Baseline: 70.5 / 100 = 70.5 % Goal status: INITIAL  7.  Patient will be  able to sleep without props for shoulder pain.   Baseline:  Goal status: INITIAL      PLAN:  PT FREQUENCY: 2x/week  PT DURATION: 6 weeks  PLANNED INTERVENTIONS: Therapeutic exercises, Therapeutic activity, Neuromuscular re-education, Patient/Family education, Self Care, Joint mobilization, Dry Needling, Electrical stimulation, Spinal mobilization, Cryotherapy, Moist heat, Taping, Ultrasound, Ionotophoresis 4mg /ml Dexamethasone, and Manual therapy  PLAN FOR NEXT SESSION: Establish HEP, address pain with ionto and/or DN, shoulder stability and RC strength   Solon Palm, PT  05/03/2023, 5:26 PM

## 2023-05-03 ENCOUNTER — Encounter: Payer: Self-pay | Admitting: Physical Therapy

## 2023-05-03 ENCOUNTER — Other Ambulatory Visit: Payer: Self-pay

## 2023-05-03 ENCOUNTER — Ambulatory Visit: Payer: Managed Care, Other (non HMO) | Attending: Physician Assistant | Admitting: Physical Therapy

## 2023-05-03 DIAGNOSIS — R252 Cramp and spasm: Secondary | ICD-10-CM | POA: Diagnosis present

## 2023-05-03 DIAGNOSIS — M25512 Pain in left shoulder: Secondary | ICD-10-CM | POA: Insufficient documentation

## 2023-05-03 DIAGNOSIS — M6281 Muscle weakness (generalized): Secondary | ICD-10-CM | POA: Insufficient documentation

## 2023-05-03 DIAGNOSIS — M25612 Stiffness of left shoulder, not elsewhere classified: Secondary | ICD-10-CM | POA: Insufficient documentation

## 2023-05-09 ENCOUNTER — Ambulatory Visit: Payer: Managed Care, Other (non HMO)

## 2023-05-09 ENCOUNTER — Other Ambulatory Visit: Payer: Self-pay

## 2023-05-09 DIAGNOSIS — M25512 Pain in left shoulder: Secondary | ICD-10-CM

## 2023-05-09 DIAGNOSIS — M25612 Stiffness of left shoulder, not elsewhere classified: Secondary | ICD-10-CM

## 2023-05-09 DIAGNOSIS — R252 Cramp and spasm: Secondary | ICD-10-CM

## 2023-05-09 NOTE — Therapy (Signed)
OUTPATIENT PHYSICAL THERAPY SHOULDER EVALUATION   Patient Name: Anna Rowe MRN: [id number] DOB:1975-09-27, 47 y.o., female Today's Date: 05/09/2023  END OF SESSION:  PT End of Session - 05/09/23 1257     Visit Number 2    Date for PT Re-Evaluation 06/28/23    Authorization Type Generic commercial and [facility]- requested 12 visits 9/18-11/13/24    Progress Note Due on Visit 10    PT Start Time 0847    PT Stop Time 0930    PT Time Calculation (min) 43 min    Activity Tolerance Patient tolerated treatment well    Behavior During Therapy WFL for tasks assessed/performed              Past Medical History:  Diagnosis Date   Arthritis    CHF (congestive heart failure) (HCC)    COPD (chronic obstructive pulmonary disease) (HCC)    Diabetes mellitus (HCC)    type 2    DVT (deep venous thrombosis) (HCC) dx 10-2016   RLE    HLD (hyperlipidemia)    on crestor   Pneumonia    09/2016   Sleep apnea    per patient ;been dx does not use CPAP does not tolerate   Past Surgical History:  Procedure Laterality Date   ACHILLES TENDON REPAIR     HERNIA REPAIR     x2; hiatal and umbilical    IVC FILTER INSERTION N/A 04/25/2017   Procedure: IVC Filter Insertion;  Surgeon: Nada Libman, MD;  [facility];  Service: Cardiovascular;  Laterality: N/A;   IVC FILTER REMOVAL N/A 10/24/2017   Procedure: IVC FILTER REMOVAL;  Surgeon: Nada Libman, MD;  [facility];  Service: Cardiovascular;  Laterality: N/A;   JOINT REPLACEMENT     right hip 07/14/17 Dr. Magnus Ivan   KNEE ARTHROSCOPY Right 2015   plantar     RIGHT/LEFT HEART CATH AND CORONARY ANGIOGRAPHY N/A 08/10/2018   Procedure: RIGHT/LEFT HEART CATH AND CORONARY ANGIOGRAPHY;  Surgeon: Dolores Patty, MD;  [facility];  Service: Cardiovascular;  Laterality: N/A;   TOTAL HIP ARTHROPLASTY Left 04/28/2017   Procedure: LEFT TOTAL HIP ARTHROPLASTY ANTERIOR APPROACH;   Surgeon: Kathryne Hitch, MD;  Location: [facility];  Service: Orthopedics;  Laterality: Left;   TOTAL HIP ARTHROPLASTY Right 07/14/2017   Procedure: RIGHT TOTAL HIP ARTHROPLASTY ANTERIOR APPROACH;  Surgeon: Kathryne Hitch, MD;  Location: [facility];  Service: Orthopedics;  Laterality: Right;   TOTAL KNEE ARTHROPLASTY Right 04/04/2016   Procedure: TOTAL KNEE ARTHROPLASTY;  Surgeon: Jodi Geralds, MD;  [facility]: [facility];  Service: Orthopedics;  Laterality: Right;   Patient Active Problem List   Diagnosis Date Noted   Bradycardia 01/17/2023   Chronic systolic (congestive) heart failure (HCC) 11/08/2018   CHF (congestive heart failure) (HCC) 08/09/2018   Acute diastolic CHF (congestive heart failure) (HCC) 08/08/2018   DM2 (diabetes mellitus, type 2) (HCC) 08/08/2018   History of DVT (deep vein thrombosis) 08/08/2018   Unilateral primary osteoarthritis, left knee 08/24/2017   History of total right knee replacement 08/24/2017   Chronic bilateral low back pain 08/24/2017   Status post total replacement of right hip 07/14/2017   Status post total replacement of left hip 04/28/2017   Unilateral primary osteoarthritis, left hip 03/09/2017   Unilateral primary osteoarthritis, right hip 03/09/2017   Primary osteoarthritis of right knee 04/04/2016    [facility]P: [facility]   REFERRING PROVIDER: Kirtland Bouchard, PA-C  REFERRING DIAG: M25.512 (ICD-10-CM) - Acute pain of left shoulder   THERAPY DIAG:  Acute pain of left shoulder  Stiffness of left shoulder, not elsewhere classified  Cramp and spasm  Rationale for Evaluation and Treatment: Rehabilitation  ONSET DATE: 01/02/23  SUBJECTIVE:                                                                                                                                                                                      SUBJECTIVE STATEMENT:05/09/23:  still feels like L shoulder shifts and hurts, particularly when she is lying  down.  She turns on R side and abducts L shoulder to 90 degrees to "reset" and give relief Patient fell on 01/02/23. Her R knee gave out in the shower and she hurt her left shoulder trying to catch herself. She also reports R UE numbness. Pain is worse with lying flat. Had a recent MRI for heart and the shoulder pain was very bad. Pushing down into cane also hurts, washing her back.  Hand dominance: Right  PERTINENT HISTORY: Spinal cord stimulator for LBP, DM, B THA, R TKR, CHF, DVT RLE, sleep apnea  PAIN:  Are you having pain? Yes: NPRS scale: 9/10 Pain location: L shoulder (indicates deltoid region) Pain description: sharp and stabbing Aggravating factors: lying on back, reaching behind back, horizontal ABD Relieving factors: rest  PRECAUTIONS: Fall  RED FLAGS: None   WEIGHT BEARING RESTRICTIONS: No  FALLS:  Has patient fallen in last 6 months? Yes. Number of falls 5 R  leg goes numb and gives out intermittently.  LIVING ENVIRONMENT: Lives with: lives with their spouse Lives in: House/apartment  OCCUPATION: Does not work; on disability  PLOF: Independent  PATIENT GOALS:get rid of pain  NEXT MD VISIT:   OBJECTIVE:   DIAGNOSTIC FINDINGS:  XR - negative  PATIENT SURVEYS:  Quick Dash 70.5 / 100 = 70.5 %  COGNITION: Overall cognitive status: Within functional limits for tasks assessed     SENSATION: WFL  POSTURE: Forward head, rounded shoulders, post pelvic tilt  UPPER EXTREMITY ROM: *pain  A/P ROM Right eval Left eval  Shoulder flexion  153/158*  Shoulder extension  60  Shoulder abduction  155/180*  Shoulder adduction    Shoulder internal rotation  55/62*  Shoulder external rotation  full  Elbow flexion    Elbow extension    Wrist flexion    Wrist extension    Wrist ulnar deviation    Wrist radial deviation    Wrist pronation    Wrist supination    (Blank rows = not tested)  UPPER EXTREMITY MMT:  MMT Right eval Left eval  Shoulder flexion  5  Shoulder extension  4+  Shoulder abduction  4  Shoulder adduction    Shoulder internal rotation  5  Shoulder external rotation  4  Middle trapezius    Lower trapezius    (Blank rows = not tested)  SHOULDER SPECIAL TESTS: Impingement tests: Neer impingement test: negative and Hawkins/Kennedy impingement test: negative Instability tests: Apprehension test: negative Rotator cuff assessment: Drop arm test: negative, Empty can test: positive , Full can test: negative, Gerber lift off test: negative, and Belly press test: positive    JOINT MOBILITY TESTING:  No pain with joint mobility testing  PALPATION:  L supraspinatus near acromion, subscapularis and infraspinatus TTP   TODAY'S TREATMENT:                                                                                                                                         DATE:  05/09/23:  Therex:  instructed the patient in the following exercises, designed to improve the stability of L shoulder: also incorporated kinesiotaping as follows: 2 I- pieces, extending from ant delt to middle traps with 35% pull, and one lat delts to L upper traps with 35% pull Instructed in isometric strengthening for L shoulder flex, abd, ext, IR, ER , instructed in sitting due to instability in standing, fall risk 10 sec holds, 5 reps each  Prone for PA glides post ribs and cross friction massage L middle traps and L infraspinatus Attempted horizontal abd L shoulder in prone and pt unable Therefore inst in prone L scapular retraction , patient also inst in this ex in standing pendulum position Education in rationale for these specific exercises, to assist with motor relearning and stabilization of deep rotators.  05/03/23  Self care: use of rolled towel under humerus when lying on back to maintain joint position; postural ed; TE: reviewed current HEP from MD and added scaption/ABD wall slides standing pain free range   PATIENT EDUCATION: Education  details: PT eval findings, anticipated POC, HEP review, postural awareness, posture and body mechanics for typical daily postioning, mobility and household tasks, role of DN, and Ionto patch wearing instructions  Person educated: Patient Education method: Explanation, Demonstration, and Handouts Education comprehension: verbalized understanding and returned demonstration  HOME EXERCISE PROGRAM: To be established; reviewed MD issued program and added scaption/ABD wall slides. Access Code: [email or web address] URL: [email or web address] Date: 05/09/2023 Prepared by: Caralee Ates  Exercises - Standing Isometric Shoulder Internal Rotation at Doorway  - 1 x daily - 7 x weekly - 3 sets - 10 reps - Standing Isometric Shoulder External Rotation with Doorway  - 1 x daily - 7 x weekly - 3 sets - 10 reps - Standing Isometric Shoulder Extension with Doorway - Arm Bent  - 1 x daily - 7 x weekly - 3 sets - 10 reps - Standing Isometric Shoulder Abduction with Doorway - Arm Bent  - 1 x daily -  7 x weekly - 3 sets - 10 reps - Standing Isometric Shoulder Flexion with Doorway - Arm Bent  - 1 x daily - 7 x weekly - 3 sets - 10 reps - Flexion-Extension Shoulder Pendulum with Table Support  - 1 x daily - 7 x weekly - 3 sets - 10 reps ASSESSMENT:  CLINICAL IMPRESSION: Patient is a 47 y.o. female who participated in physical therapy treatment for left shoulder pain beginning 4 months ago after a fall in the shower. Patient demonstrates deficits in L shoulder strength and ROM and has pain mainly with lying supine, horizontal ABD and IR.  Today progressed with therex and Rx to improve her shoulder stability .  She tolerated well.  She has good ROM, painful especially L subscapularis area and L middle traps.   Patient will benefit from skilled PT to address these deficits.    OBJECTIVE IMPAIRMENTS: decreased activity tolerance, decreased ROM, decreased strength, increased muscle spasms, impaired flexibility, impaired  UE functional use, postural dysfunction, and pain.   ACTIVITY LIMITATIONS: sleeping, bathing, dressing, and reaching outward horizontally  PARTICIPATION LIMITATIONS: cleaning  PERSONAL FACTORS: Fitness, Time since onset of injury/illness/exacerbation, and 1 comorbidity: DM  are also affecting patient's functional outcome.   REHAB POTENTIAL: Good  CLINICAL DECISION MAKING: Evolving/moderate complexity  EVALUATION COMPLEXITY: Low   GOALS: Goals reviewed with patient? Yes  SHORT TERM GOALS: Target date: 05/24/2023   Patient will be independent with initial HEP.  Baseline:  Goal status: INITIAL   LONG TERM GOALS: Target date: 06/28/2023   Patient will be independent with advanced/ongoing HEP to improve outcomes and carryover.  Baseline:  Goal status: INITIAL  2.  Patient will report 75% improvement in L shoulder pain to improve QOL.  Baseline:  Goal status: INITIAL  3.  Patient to demonstrate improved upright posture with posterior shoulder girdle engaged to promote improved glenohumeral joint mobility. Baseline:  Goal status: INITIAL  4.  Patient to improve L shoulder AROM to [facility] without pain provocation to allow for increased ease of ADLs.  Baseline:  Goal status: INITIAL  5.  Patient will demonstrate improved  UE strength to 5/5 to normalize ADLS. Baseline:  Goal status: INITIAL  6  Patient will report 60 on Quick Dash to demonstrate improved functional ability.  Baseline: 70.5 / 100 = 70.5 % Goal status: INITIAL  7.  Patient will be able to sleep without props for shoulder pain.   Baseline:  Goal status: INITIAL      PLAN:  PT FREQUENCY: 2x/week  PT DURATION: 6 weeks  PLANNED INTERVENTIONS: Therapeutic exercises, Therapeutic activity, Neuromuscular re-education, Patient/Family education, Self Care, Joint mobilization, Dry Needling, Electrical stimulation, Spinal mobilization, Cryotherapy, Moist heat, Taping, Ultrasound, Ionotophoresis 4mg /ml  Dexamethasone, and Manual therapy  PLAN FOR NEXT SESSION: Establish HEP, address pain with ionto and/or DN, shoulder stability and RC strength   Solon Palm, PT  05/09/2023, 1:00 PM

## 2023-05-10 ENCOUNTER — Telehealth: Payer: Self-pay

## 2023-05-10 NOTE — Telephone Encounter (Signed)
Pt is scheduled for PVC Ablation with Dr. Elberta Fortis on 10/21... She will go to [facility] in [place] to have labs done on 10/14-15.  I sent her invite to set up her MyChart but also mailed instruction letter to her.

## 2023-05-11 ENCOUNTER — Telehealth: Payer: Self-pay | Admitting: *Deleted

## 2023-05-11 NOTE — Telephone Encounter (Signed)
-----   Message from [facility] April G sent at 05/10/2023  2:38 PM EDT ----- Regarding: Echo results Pt is scheduled for PVC Ablation on 10/21.Marland KitchenMarland KitchenShe said she was told that she may need an ICD depending on her Heart Function from her recent Echo. She would like someone to call her to discuss her Echo results. I briefly went over them with her and told her I didn't see anything stating that she would be getting the ICD but that I would have someone call her to discuss.   Thanks, April

## 2023-05-11 NOTE — Telephone Encounter (Signed)
Pt informed that she does not need/qualify for ICD at this time. Aware we will get a follow up echocardiogram one month after ablation. Patient verbalized understanding and agreeable to plan.

## 2023-05-17 ENCOUNTER — Ambulatory Visit: Payer: Managed Care, Other (non HMO) | Admitting: Physical Therapy

## 2023-05-19 ENCOUNTER — Ambulatory Visit: Payer: Managed Care, Other (non HMO) | Attending: Physician Assistant

## 2023-05-19 DIAGNOSIS — R252 Cramp and spasm: Secondary | ICD-10-CM | POA: Insufficient documentation

## 2023-05-19 DIAGNOSIS — M25612 Stiffness of left shoulder, not elsewhere classified: Secondary | ICD-10-CM | POA: Insufficient documentation

## 2023-05-19 DIAGNOSIS — M25512 Pain in left shoulder: Secondary | ICD-10-CM | POA: Insufficient documentation

## 2023-05-19 DIAGNOSIS — M6281 Muscle weakness (generalized): Secondary | ICD-10-CM | POA: Diagnosis present

## 2023-05-19 NOTE — Therapy (Signed)
OUTPATIENT PHYSICAL THERAPY SHOULDER TREATMENT   Patient Name: Anna Rowe MRN: [id number] DOB:1976/04/02, 47 y.o., female Today's Date: 05/19/2023  END OF SESSION:  PT End of Session - 05/19/23 0925     Visit Number 3    Date for PT Re-Evaluation 06/28/23    Authorization Type 12 visits 9/18-11/13/24    Progress Note Due on Visit 10    PT Start Time 0847    PT Stop Time 0932    PT Time Calculation (min) 45 min    Activity Tolerance Patient tolerated treatment well    Behavior During Therapy [facility] for tasks assessed/performed               Past Medical History:  Diagnosis Date   Arthritis    CHF (congestive heart failure) (HCC)    COPD (chronic obstructive pulmonary disease) (HCC)    Diabetes mellitus (HCC)    type 2    DVT (deep venous thrombosis) (HCC) dx 10-2016   RLE    HLD (hyperlipidemia)    on crestor   Pneumonia    09/2016   Sleep apnea    per patient ;been dx does not use CPAP does not tolerate   Past Surgical History:  Procedure Laterality Date   ACHILLES TENDON REPAIR     HERNIA REPAIR     x2; hiatal and umbilical    IVC FILTER INSERTION N/A 04/25/2017   Procedure: IVC Filter Insertion;  Surgeon: Nada Libman, MD;  Location: [facility];  Service: Cardiovascular;  Laterality: N/A;   IVC FILTER REMOVAL N/A 10/24/2017   Procedure: IVC FILTER REMOVAL;  Surgeon: Nada Libman, MD;  Location: [facility];  Service: Cardiovascular;  Laterality: N/A;   JOINT REPLACEMENT     right hip 07/14/17 Dr. Magnus Ivan   KNEE ARTHROSCOPY Right 2015   plantar     RIGHT/LEFT HEART CATH AND CORONARY ANGIOGRAPHY N/A 08/10/2018   Procedure: RIGHT/LEFT HEART CATH AND CORONARY ANGIOGRAPHY;  Surgeon: Dolores Patty, MD;  Location: [facility];  Service: Cardiovascular;  Laterality: N/A;   TOTAL HIP ARTHROPLASTY Left 04/28/2017   Procedure: LEFT TOTAL HIP ARTHROPLASTY ANTERIOR APPROACH;  Surgeon: Kathryne Hitch, MD;   Location: [facility];  Service: Orthopedics;  Laterality: Left;   TOTAL HIP ARTHROPLASTY Right 07/14/2017   Procedure: RIGHT TOTAL HIP ARTHROPLASTY ANTERIOR APPROACH;  Surgeon: Kathryne Hitch, MD;  Location: [facility];  Service: Orthopedics;  Laterality: Right;   TOTAL KNEE ARTHROPLASTY Right 04/04/2016   Procedure: TOTAL KNEE ARTHROPLASTY;  Surgeon: Jodi Geralds, MD;  Location: [facility];  Service: Orthopedics;  Laterality: Right;   Patient Active Problem List   Diagnosis Date Noted   Bradycardia 01/17/2023   Chronic systolic (congestive) heart failure (HCC) 11/08/2018   CHF (congestive heart failure) (HCC) 08/09/2018   Acute diastolic CHF (congestive heart failure) (HCC) 08/08/2018   DM2 (diabetes mellitus, type 2) (HCC) 08/08/2018   History of DVT (deep vein thrombosis) 08/08/2018   Unilateral primary osteoarthritis, left knee 08/24/2017   History of total right knee replacement 08/24/2017   Chronic bilateral low back pain 08/24/2017   Status post total replacement of right hip 07/14/2017   Status post total replacement of left hip 04/28/2017   Unilateral primary osteoarthritis, left hip 03/09/2017   Unilateral primary osteoarthritis, right hip 03/09/2017   Primary osteoarthritis of right knee 04/04/2016    PCP: [facility]   REFERRING PROVIDER: Kirtland Bouchard, PA-C   REFERRING DIAG: [phone] (  ICD-10-CM) - Acute pain of left shoulder   THERAPY DIAG:  Acute pain of left shoulder  Stiffness of left shoulder, not elsewhere classified  Cramp and spasm  Muscle weakness (generalized)  Rationale for Evaluation and Treatment: Rehabilitation  ONSET DATE: 01/02/23  SUBJECTIVE:                                                                                                                                                                                      SUBJECTIVE STATEMENT:Pt notes 3/10 pain in R shoulder.  Patient fell on 01/02/23. Her R knee gave out in the shower  and she hurt her left shoulder trying to catch herself. She also reports R UE numbness. Pain is worse with lying flat. Had a recent MRI for heart and the shoulder pain was very bad. Pushing down into cane also hurts, washing her back.  Hand dominance: Right  PERTINENT HISTORY: Spinal cord stimulator for LBP, DM, B THA, R TKR, CHF, DVT RLE, sleep apnea  PAIN:  Are you having pain? Yes: NPRS scale: 3/10 Pain location: L shoulder (indicates deltoid region) Pain description: sharp and stabbing Aggravating factors: lying on back, reaching behind back, horizontal ABD Relieving factors: rest  PRECAUTIONS: Fall  RED FLAGS: None   WEIGHT BEARING RESTRICTIONS: No  FALLS:  Has patient fallen in last 6 months? Yes. Number of falls 5 R  leg goes numb and gives out intermittently.  LIVING ENVIRONMENT: Lives with: lives with their spouse Lives in: House/apartment  OCCUPATION: Does not work; on disability  PLOF: Independent  PATIENT GOALS:get rid of pain  NEXT MD VISIT:   OBJECTIVE:   DIAGNOSTIC FINDINGS:  XR - negative  PATIENT SURVEYS:  Quick Dash 70.5 / 100 = 70.5 %  COGNITION: Overall cognitive status: Within functional limits for tasks assessed     SENSATION: WFL  POSTURE: Forward head, rounded shoulders, post pelvic tilt  UPPER EXTREMITY ROM: *pain  A/P ROM Right eval Left eval  Shoulder flexion  153/158*  Shoulder extension  60  Shoulder abduction  155/180*  Shoulder adduction    Shoulder internal rotation  55/62*  Shoulder external rotation  full  Elbow flexion    Elbow extension    Wrist flexion    Wrist extension    Wrist ulnar deviation    Wrist radial deviation    Wrist pronation    Wrist supination    (Blank rows = not tested)  UPPER EXTREMITY MMT:  MMT Right eval Left eval  Shoulder flexion  5  Shoulder extension  4+  Shoulder abduction  4  Shoulder adduction    Shoulder internal rotation  5  Shoulder external  rotation  4  Middle  trapezius    Lower trapezius    (Blank rows = not tested)  SHOULDER SPECIAL TESTS: Impingement tests: Neer impingement test: negative and Hawkins/Kennedy impingement test: negative Instability tests: Apprehension test: negative Rotator cuff assessment: Drop arm test: negative, Empty can test: positive , Full can test: negative, Gerber lift off test: negative, and Belly press test: positive    JOINT MOBILITY TESTING:  No pain with joint mobility testing  PALPATION:  L supraspinatus near acromion, subscapularis and infraspinatus TTP   TODAY'S TREATMENT:                                                                                                                                         DATE:  05/19/23 Nustep L3x86min Seated L shoulder ER 2x10; IR 2x10 Seated rows RTB x 10  Seated shld ext RTB x 10  Seated shoulder flexion with horizontal ABD x 10 YTB  05/09/23:  Therex:  instructed the patient in the following exercises, designed to improve the stability of L shoulder: also incorporated kinesiotaping as follows: 2 I- pieces, extending from ant delt to middle traps with 35% pull, and one lat delts to L upper traps with 35% pull Instructed in isometric strengthening for L shoulder flex, abd, ext, IR, ER , instructed in sitting due to instability in standing, fall risk 10 sec holds, 5 reps each  Prone for PA glides post ribs and cross friction massage L middle traps and L infraspinatus Attempted horizontal abd L shoulder in prone and pt unable Therefore inst in prone L scapular retraction , patient also inst in this ex in standing pendulum position Education in rationale for these specific exercises, to assist with motor relearning and stabilization of deep rotators.  05/03/23  Self care: use of rolled towel under humerus when lying on back to maintain joint position; postural ed; TE: reviewed current HEP from MD and added scaption/ABD wall slides standing pain free range   PATIENT  EDUCATION: Education details: PT eval findings, anticipated POC, HEP review, postural awareness, posture and body mechanics for typical daily postioning, mobility and household tasks, role of DN, and Ionto patch wearing instructions  Person educated: Patient Education method: Explanation, Demonstration, and Handouts Education comprehension: verbalized understanding and returned demonstration  HOME EXERCISE PROGRAM: To be established; reviewed MD issued program and added scaption/ABD wall slides. Access Code: [email or web address] URL: [email or web address] Date: 05/19/2023 Prepared by: Verta Ellen  Exercises - Standing Isometric Shoulder Abduction with Doorway - Arm Bent  - 1 x daily - 7 x weekly - 3 sets - 10 reps - Standing Isometric Shoulder Flexion with Doorway - Arm Bent  - 1 x daily - 7 x weekly - 3 sets - 10 reps - Flexion-Extension Shoulder Pendulum with Table Support  - 1 x daily - 7 x weekly - 3 sets - 10 reps - Seated Shoulder External Rotation with  Resistance  - 1 x daily - 3 x weekly - 2 sets - 10 reps - Seated Shoulder Internal Rotation with Anchored Resistance  - 1 x daily - 3 x weekly - 2 sets - 10 reps - Seated Shoulder Row with Anchored Resistance  - 1 x daily - 7 x weekly - 2 sets - 10 reps - Seated Shoulder Extension and Scapular Retraction with Resistance  - 1 x daily - 7 x weekly - 2 sets - 10 reps ASSESSMENT:  CLINICAL IMPRESSION: Progressed postural strengthening and shoulder stabilization to tolerance. Cues required for depression and retraction of scapula during the last exercise. Cues with the ER and IR to avoid moving elbow to target RTC. Patient will benefit from skilled PT to address current deficits.    OBJECTIVE IMPAIRMENTS: decreased activity tolerance, decreased ROM, decreased strength, increased muscle spasms, impaired flexibility, impaired UE functional use, postural dysfunction, and pain.   ACTIVITY LIMITATIONS: sleeping, bathing, dressing, and  reaching outward horizontally  PARTICIPATION LIMITATIONS: cleaning  PERSONAL FACTORS: Fitness, Time since onset of injury/illness/exacerbation, and 1 comorbidity: DM  are also affecting patient's functional outcome.   REHAB POTENTIAL: Good  CLINICAL DECISION MAKING: Evolving/moderate complexity  EVALUATION COMPLEXITY: Low   GOALS: Goals reviewed with patient? Yes  SHORT TERM GOALS: Target date: 05/24/2023   Patient will be independent with initial HEP.  Baseline:  Goal status: INITIAL   LONG TERM GOALS: Target date: 06/28/2023   Patient will be independent with advanced/ongoing HEP to improve outcomes and carryover.  Baseline:  Goal status: INITIAL  2.  Patient will report 75% improvement in L shoulder pain to improve QOL.  Baseline:  Goal status: INITIAL  3.  Patient to demonstrate improved upright posture with posterior shoulder girdle engaged to promote improved glenohumeral joint mobility. Baseline:  Goal status: INITIAL  4.  Patient to improve L shoulder AROM to [facility] without pain provocation to allow for increased ease of ADLs.  Baseline:  Goal status: INITIAL  5.  Patient will demonstrate improved  UE strength to 5/5 to normalize ADLS. Baseline:  Goal status: INITIAL  6  Patient will report 60 on Quick Dash to demonstrate improved functional ability.  Baseline: 70.5 / 100 = 70.5 % Goal status: INITIAL  7.  Patient will be able to sleep without props for shoulder pain.   Baseline:  Goal status: INITIAL      PLAN:  PT FREQUENCY: 2x/week  PT DURATION: 6 weeks  PLANNED INTERVENTIONS: Therapeutic exercises, Therapeutic activity, Neuromuscular re-education, Patient/Family education, Self Care, Joint mobilization, Dry Needling, Electrical stimulation, Spinal mobilization, Cryotherapy, Moist heat, Taping, Ultrasound, Ionotophoresis 4mg /ml Dexamethasone, and Manual therapy  PLAN FOR NEXT SESSION: Establish HEP, address pain with ionto and/or DN, shoulder  stability and RC strength   Dorrell Mitcheltree L Amai Cappiello, PTA  05/19/2023, 9:42 AM

## 2023-05-23 NOTE — Therapy (Signed)
OUTPATIENT PHYSICAL THERAPY SHOULDER TREATMENT   Patient Name: Anna Rowe MRN: [id number] DOB:1975-12-01, 47 y.o., female Today's Date: 05/24/2023  END OF SESSION:  PT End of Session - 05/24/23 0844     Visit Number 4    Date for PT Re-Evaluation 06/28/23    Authorization Type 12 visits 9/18-11/13/24    Progress Note Due on Visit 10    PT Start Time 0844    PT Stop Time 0929    PT Time Calculation (min) 45 min    Activity Tolerance Patient tolerated treatment well    Behavior During Therapy [facility] for tasks assessed/performed                Past Medical History:  Diagnosis Date   Arthritis    CHF (congestive heart failure) (HCC)    COPD (chronic obstructive pulmonary disease) (HCC)    Diabetes mellitus (HCC)    type 2    DVT (deep venous thrombosis) (HCC) dx 10-2016   RLE    HLD (hyperlipidemia)    on crestor   Pneumonia    09/2016   Sleep apnea    per patient ;been dx does not use CPAP does not tolerate   Past Surgical History:  Procedure Laterality Date   ACHILLES TENDON REPAIR     HERNIA REPAIR     x2; hiatal and umbilical    IVC FILTER INSERTION N/A 04/25/2017   Procedure: IVC Filter Insertion;  Surgeon: Nada Libman, MD;  Location: [facility];  Service: Cardiovascular;  Laterality: N/A;   IVC FILTER REMOVAL N/A 10/24/2017   Procedure: IVC FILTER REMOVAL;  Surgeon: Nada Libman, MD;  Location: [facility];  Service: Cardiovascular;  Laterality: N/A;   JOINT REPLACEMENT     right hip 07/14/17 Dr. Magnus Ivan   KNEE ARTHROSCOPY Right 2015   plantar     RIGHT/LEFT HEART CATH AND CORONARY ANGIOGRAPHY N/A 08/10/2018   Procedure: RIGHT/LEFT HEART CATH AND CORONARY ANGIOGRAPHY;  Surgeon: Dolores Patty, MD;  Location: [facility];  Service: Cardiovascular;  Laterality: N/A;   TOTAL HIP ARTHROPLASTY Left 04/28/2017   Procedure: LEFT TOTAL HIP ARTHROPLASTY ANTERIOR APPROACH;  Surgeon: Kathryne Hitch, MD;   Location: [facility];  Service: Orthopedics;  Laterality: Left;   TOTAL HIP ARTHROPLASTY Right 07/14/2017   Procedure: RIGHT TOTAL HIP ARTHROPLASTY ANTERIOR APPROACH;  Surgeon: Kathryne Hitch, MD;  Location: [facility];  Service: Orthopedics;  Laterality: Right;   TOTAL KNEE ARTHROPLASTY Right 04/04/2016   Procedure: TOTAL KNEE ARTHROPLASTY;  Surgeon: Jodi Geralds, MD;  Location: [facility];  Service: Orthopedics;  Laterality: Right;   Patient Active Problem List   Diagnosis Date Noted   Bradycardia 01/17/2023   Chronic systolic (congestive) heart failure (HCC) 11/08/2018   CHF (congestive heart failure) (HCC) 08/09/2018   Acute diastolic CHF (congestive heart failure) (HCC) 08/08/2018   DM2 (diabetes mellitus, type 2) (HCC) 08/08/2018   History of DVT (deep vein thrombosis) 08/08/2018   Unilateral primary osteoarthritis, left knee 08/24/2017   History of total right knee replacement 08/24/2017   Chronic bilateral low back pain 08/24/2017   Status post total replacement of right hip 07/14/2017   Status post total replacement of left hip 04/28/2017   Unilateral primary osteoarthritis, left hip 03/09/2017   Unilateral primary osteoarthritis, right hip 03/09/2017   Primary osteoarthritis of right knee 04/04/2016    PCP: [facility]   REFERRING PROVIDER: Kirtland Bouchard, PA-C   REFERRING DIAG:  M25.512 (ICD-10-CM) - Acute pain of left shoulder   THERAPY DIAG:  Acute pain of left shoulder  Stiffness of left shoulder, not elsewhere classified  Cramp and spasm  Muscle weakness (generalized)  Rationale for Evaluation and Treatment: Rehabilitation  ONSET DATE: 01/02/23  SUBJECTIVE:                                                                                                                                                                                      SUBJECTIVE STATEMENT:  I've been hurting ever since last visit.  Eval: Patient fell on 01/02/23. Her R knee gave  out in the shower and she hurt her left shoulder trying to catch herself. She also reports R UE numbness. Pain is worse with lying flat. Had a recent MRI for heart and the shoulder pain was very bad. Pushing down into cane also hurts, washing her back.  Hand dominance: Right  PERTINENT HISTORY: Spinal cord stimulator for LBP, DM, B THA, R TKR, CHF, DVT RLE, sleep apnea  PAIN:  Are you having pain? Yes: NPRS scale: 7/10 Pain location: L shoulder (indicates deltoid region) Pain description: sharp and stabbing Aggravating factors: lying on back, reaching behind back, horizontal ABD Relieving factors: rest  PRECAUTIONS: Fall  RED FLAGS: None   WEIGHT BEARING RESTRICTIONS: No  FALLS:  Has patient fallen in last 6 months? Yes. Number of falls 5 R  leg goes numb and gives out intermittently.  LIVING ENVIRONMENT: Lives with: lives with their spouse Lives in: House/apartment  OCCUPATION: Does not work; on disability  PLOF: Independent  PATIENT GOALS:get rid of pain  NEXT MD VISIT:   OBJECTIVE:   DIAGNOSTIC FINDINGS:  XR - negative  PATIENT SURVEYS:  Quick Dash 70.5 / 100 = 70.5 %  COGNITION: Overall cognitive status: Within functional limits for tasks assessed     SENSATION: WFL  POSTURE: Forward head, rounded shoulders, post pelvic tilt  UPPER EXTREMITY ROM: *pain  A/P ROM Right eval Left eval  Shoulder flexion  153/158*  Shoulder extension  60  Shoulder abduction  155/180*  Shoulder adduction    Shoulder internal rotation  55/62*  Shoulder external rotation  full  Elbow flexion    Elbow extension    Wrist flexion    Wrist extension    Wrist ulnar deviation    Wrist radial deviation    Wrist pronation    Wrist supination    (Blank rows = not tested)  UPPER EXTREMITY MMT:  MMT Right eval Left eval  Shoulder flexion  5  Shoulder extension  4+  Shoulder abduction  4  Shoulder adduction    Shoulder internal rotation  5  Shoulder external  rotation  4  Middle trapezius    Lower trapezius    (Blank rows = not tested)  SHOULDER SPECIAL TESTS: Impingement tests: Neer impingement test: negative and Hawkins/Kennedy impingement test: negative Instability tests: Apprehension test: negative Rotator cuff assessment: Drop arm test: negative, Empty can test: positive , Full can test: negative, Gerber lift off test: negative, and Belly press test: positive    JOINT MOBILITY TESTING:  No pain with joint mobility testing  PALPATION:  L supraspinatus near acromion, subscapularis and infraspinatus TTP   TODAY'S TREATMENT:                                                                                                                                         DATE:  05/24/23 Manual: Skilled palpation and monitoring of soft tissues during DN. STM wear tolerated to areas needled.  Trigger Point Dry-Needling  Treatment instructions: Expect mild to moderate muscle soreness. S/S of pneumothorax if dry needled over a lung field, and to seek immediate medical attention should they occur. Patient verbalized understanding of these instructions and education. Patient Consent Given: Yes Education handout provided: Yes Muscles treated: L UT, levator, lats and Infraspinatus Electrical stimulation performed: No Parameters: N/A Treatment response/outcome: Palpable Increase in Muscle Length   Rock tape applied 3 I strips deltoids to UT (ant/med/post)  Seated L shoulder ER1x10; scapular retraction x 10 Seated shoulder isometrics ABD, IR, flex Bent over  scapular retraction x 25 Seated shrug x 10  05/19/23 Nustep L3x57min Seated L shoulder ER 2x10; IR 2x10 Seated rows RTB x 10  Seated shld ext RTB x 10  Seated shoulder flexion with horizontal ABD x 10 YTB  05/09/23:  Therex:  instructed the patient in the following exercises, designed to improve the stability of L shoulder: also incorporated kinesiotaping as follows: 2 I- pieces, extending from  ant delt to middle traps with 35% pull, and one lat delts to L upper traps with 35% pull Instructed in isometric strengthening for L shoulder flex, abd, ext, IR, ER , instructed in sitting due to instability in standing, fall risk 10 sec holds, 5 reps each  Prone for PA glides post ribs and cross friction massage L middle traps and L infraspinatus Attempted horizontal abd L shoulder in prone and pt unable Therefore inst in prone L scapular retraction , patient also inst in this ex in standing pendulum position Education in rationale for these specific exercises, to assist with motor relearning and stabilization of deep rotators.  05/03/23  Self care: use of rolled towel under humerus when lying on back to maintain joint position; postural ed; TE: reviewed current HEP from MD and added scaption/ABD wall slides standing pain free range   PATIENT EDUCATION: Education details: PT eval findings, anticipated POC, HEP review, postural awareness, posture and body mechanics for typical daily postioning, mobility and household tasks, role of DN,  and Ionto patch wearing instructions  Person educated: Patient Education method: Explanation, Demonstration, and Handouts Education comprehension: verbalized understanding and returned demonstration  HOME EXERCISE PROGRAM: To be established; reviewed MD issued program and added scaption/ABD wall slides. Access Code: [email or web address] URL: [email or web address] Date: 05/19/2023 Prepared by: Verta Ellen  Exercises - Standing Isometric Shoulder Abduction with Doorway - Arm Bent  - 1 x daily - 7 x weekly - 3 sets - 10 reps - Standing Isometric Shoulder Flexion with Doorway - Arm Bent  - 1 x daily - 7 x weekly - 3 sets - 10 reps - Flexion-Extension Shoulder Pendulum with Table Support  - 1 x daily - 7 x weekly - 3 sets - 10 reps - Seated Shoulder External Rotation with Resistance  - 1 x daily - 3 x weekly - 2 sets - 10 reps - Seated Shoulder Internal  Rotation with Anchored Resistance  - 1 x daily - 3 x weekly - 2 sets - 10 reps - Seated Shoulder Row with Anchored Resistance  - 1 x daily - 7 x weekly - 2 sets - 10 reps - Seated Shoulder Extension and Scapular Retraction with Resistance  - 1 x daily - 7 x weekly - 2 sets - 10 reps ASSESSMENT:  CLINICAL IMPRESSION: Kajah reports increased pain since last visit. She reports some relief from tape. She had marked tightness and marked tenderness in her L UT, levator and superficial scapular muscles so trial of DN was done after consent given. She did not have any significant twitch responses, but did have elongation of tissues particularly in the lats and infraspinatus. Tape was re-applied to support her shoulder. She was able to perform active flexion (hand shake position) with less pain after and was able to tolerate TE. She reports decreased pain from 7/10 to 5/10 at end of session. Pt has a TENS unit at home and she was advised to try this if pain continues. She continues to demonstrate potential for improvement and would benefit from continued skilled therapy to address impairments.    OBJECTIVE IMPAIRMENTS: decreased activity tolerance, decreased ROM, decreased strength, increased muscle spasms, impaired flexibility, impaired UE functional use, postural dysfunction, and pain.   ACTIVITY LIMITATIONS: sleeping, bathing, dressing, and reaching outward horizontally  PARTICIPATION LIMITATIONS: cleaning  PERSONAL FACTORS: Fitness, Time since onset of injury/illness/exacerbation, and 1 comorbidity: DM  are also affecting patient's functional outcome.   REHAB POTENTIAL: Good  CLINICAL DECISION MAKING: Evolving/moderate complexity  EVALUATION COMPLEXITY: Low   GOALS: Goals reviewed with patient? Yes  SHORT TERM GOALS: Target date: 05/24/2023   Patient will be independent with initial HEP.  Baseline:  Goal status: INITIAL   LONG TERM GOALS: Target date: 06/28/2023   Patient will be  independent with advanced/ongoing HEP to improve outcomes and carryover.  Baseline:  Goal status: INITIAL  2.  Patient will report 75% improvement in L shoulder pain to improve QOL.  Baseline:  Goal status: INITIAL  3.  Patient to demonstrate improved upright posture with posterior shoulder girdle engaged to promote improved glenohumeral joint mobility. Baseline:  Goal status: INITIAL  4.  Patient to improve L shoulder AROM to [facility] without pain provocation to allow for increased ease of ADLs.  Baseline:  Goal status: INITIAL  5.  Patient will demonstrate improved  UE strength to 5/5 to normalize ADLS. Baseline:  Goal status: INITIAL  6  Patient will report 60 on Quick Dash to demonstrate improved functional ability.  Baseline: 70.5 / 100 = 70.5 %  Goal status: INITIAL  7.  Patient will be able to sleep without props for shoulder pain.   Baseline:  Goal status: INITIAL      PLAN:  PT FREQUENCY: 2x/week  PT DURATION: 6 weeks  PLANNED INTERVENTIONS: Therapeutic exercises, Therapeutic activity, Neuromuscular re-education, Patient/Family education, Self Care, Joint mobilization, Dry Needling, Electrical stimulation, Spinal mobilization, Cryotherapy, Moist heat, Taping, Ultrasound, Ionotophoresis 4mg /ml Dexamethasone, and Manual therapy  PLAN FOR NEXT SESSION: Assess response to DN and tape. Continue to gently progress shoulder strength as tolerated. Did she try the TENS unit?   Solon Palm, PT  05/24/2023, 10:29 AM

## 2023-05-24 ENCOUNTER — Ambulatory Visit: Payer: Managed Care, Other (non HMO) | Admitting: Physical Therapy

## 2023-05-24 ENCOUNTER — Encounter: Payer: Self-pay | Admitting: Physical Therapy

## 2023-05-24 DIAGNOSIS — M25512 Pain in left shoulder: Secondary | ICD-10-CM

## 2023-05-24 DIAGNOSIS — R252 Cramp and spasm: Secondary | ICD-10-CM

## 2023-05-24 DIAGNOSIS — M25612 Stiffness of left shoulder, not elsewhere classified: Secondary | ICD-10-CM

## 2023-05-24 DIAGNOSIS — M6281 Muscle weakness (generalized): Secondary | ICD-10-CM

## 2023-05-26 ENCOUNTER — Ambulatory Visit: Payer: Managed Care, Other (non HMO)

## 2023-05-26 DIAGNOSIS — M25512 Pain in left shoulder: Secondary | ICD-10-CM

## 2023-05-26 DIAGNOSIS — M6281 Muscle weakness (generalized): Secondary | ICD-10-CM

## 2023-05-26 DIAGNOSIS — R252 Cramp and spasm: Secondary | ICD-10-CM

## 2023-05-26 DIAGNOSIS — M25612 Stiffness of left shoulder, not elsewhere classified: Secondary | ICD-10-CM

## 2023-05-26 NOTE — Therapy (Signed)
OUTPATIENT PHYSICAL THERAPY SHOULDER TREATMENT   Patient Name: Anna Rowe MRN: [id number] DOB:[birth year], 47 y.o., female Today's Date: 05/26/2023  END OF SESSION:  PT End of Session - 05/26/23 0926     Visit Number 5    Date for PT Re-Evaluation 06/28/23    Authorization Type 12 visits 9/18-11/13/24    Progress Note Due on Visit 10    PT Start Time 0844    PT Stop Time 0930    PT Time Calculation (min) 46 min    Activity Tolerance Patient tolerated treatment well    Behavior During Therapy [facility] for tasks assessed/performed                 Past Medical History:  Diagnosis Date   Arthritis    CHF (congestive heart failure) (HCC)    COPD (chronic obstructive pulmonary disease) (HCC)    Diabetes mellitus (HCC)    type 2    DVT (deep venous thrombosis) (HCC) dx 10-2016   RLE    HLD (hyperlipidemia)    on crestor   Pneumonia    09/2016   Sleep apnea    per patient ;been dx does not use CPAP does not tolerate   Past Surgical History:  Procedure Laterality Date   ACHILLES TENDON REPAIR     HERNIA REPAIR     x2; hiatal and umbilical    IVC FILTER INSERTION N/A 04/25/2017   Procedure: IVC Filter Insertion;  Surgeon: Nada Libman, MD;  Location: [facility];  Service: Cardiovascular;  Laterality: N/A;   IVC FILTER REMOVAL N/A 10/24/2017   Procedure: IVC FILTER REMOVAL;  Surgeon: Nada Libman, MD;  Location: [facility];  Service: Cardiovascular;  Laterality: N/A;   JOINT REPLACEMENT     right hip 07/14/17 Dr. Magnus Ivan   KNEE ARTHROSCOPY Right 2015   plantar     RIGHT/LEFT HEART CATH AND CORONARY ANGIOGRAPHY N/A 08/10/2018   Procedure: RIGHT/LEFT HEART CATH AND CORONARY ANGIOGRAPHY;  Surgeon: Dolores Patty, MD;  Location: [facility];  Service: Cardiovascular;  Laterality: N/A;   TOTAL HIP ARTHROPLASTY Left 04/28/2017   Procedure: LEFT TOTAL HIP ARTHROPLASTY ANTERIOR APPROACH;  Surgeon: Kathryne Hitch, MD;   Location: [facility];  Service: Orthopedics;  Laterality: Left;   TOTAL HIP ARTHROPLASTY Right 07/14/2017   Procedure: RIGHT TOTAL HIP ARTHROPLASTY ANTERIOR APPROACH;  Surgeon: Kathryne Hitch, MD;  Location: [facility];  Service: Orthopedics;  Laterality: Right;   TOTAL KNEE ARTHROPLASTY Right 04/04/2016   Procedure: TOTAL KNEE ARTHROPLASTY;  Surgeon: Jodi Geralds, MD;  Location: [facility];  Service: Orthopedics;  Laterality: Right;   Patient Active Problem List   Diagnosis Date Noted   Bradycardia 01/17/2023   Chronic systolic (congestive) heart failure (HCC) 11/08/2018   CHF (congestive heart failure) (HCC) 08/09/2018   Acute diastolic CHF (congestive heart failure) (HCC) 08/08/2018   DM2 (diabetes mellitus, type 2) (HCC) 08/08/2018   History of DVT (deep vein thrombosis) 08/08/2018   Unilateral primary osteoarthritis, left knee 08/24/2017   History of total right knee replacement 08/24/2017   Chronic bilateral low back pain 08/24/2017   Status post total replacement of right hip 07/14/2017   Status post total replacement of left hip 04/28/2017   Unilateral primary osteoarthritis, left hip 03/09/2017   Unilateral primary osteoarthritis, right hip 03/09/2017   Primary osteoarthritis of right knee 04/04/2016    [facility]P: [facility]   REFERRING PROVIDER: Kirtland Bouchard, PA-C   REFERRING  DIAG: M25.512 (ICD-10-CM) - Acute pain of left shoulder   THERAPY DIAG:  Acute pain of left shoulder  Stiffness of left shoulder, not elsewhere classified  Cramp and spasm  Muscle weakness (generalized)  Rationale for Evaluation and Treatment: Rehabilitation  ONSET DATE: 01/02/23  SUBJECTIVE:                                                                                                                                                                                      SUBJECTIVE STATEMENT:  Pt reports the DN, tape, and isometrics felt like they helped the pain.  Eval: Patient  fell on 01/02/23. Her R knee gave out in the shower and she hurt her left shoulder trying to catch herself. She also reports R UE numbness. Pain is worse with lying flat. Had a recent MRI for heart and the shoulder pain was very bad. Pushing down into cane also hurts, washing her back.  Hand dominance: Right  PERTINENT HISTORY: Spinal cord stimulator for LBP, DM, B THA, R TKR, CHF, DVT RLE, sleep apnea  PAIN:  Are you having pain? Yes: NPRS scale: 5/10 Pain location: L shoulder (indicates deltoid region) Pain description: sharp and stabbing Aggravating factors: lying on back, reaching behind back, horizontal ABD Relieving factors: rest  PRECAUTIONS: Fall  RED FLAGS: None   WEIGHT BEARING RESTRICTIONS: No  FALLS:  Has patient fallen in last 6 months? Yes. Number of falls 5 R  leg goes numb and gives out intermittently.  LIVING ENVIRONMENT: Lives with: lives with their spouse Lives in: House/apartment  OCCUPATION: Does not work; on disability  PLOF: Independent  PATIENT GOALS:get rid of pain  NEXT MD VISIT:   OBJECTIVE:   DIAGNOSTIC FINDINGS:  XR - negative  PATIENT SURVEYS:  Quick Dash 70.5 / 100 = 70.5 %  COGNITION: Overall cognitive status: Within functional limits for tasks assessed     SENSATION: WFL  POSTURE: Forward head, rounded shoulders, post pelvic tilt  UPPER EXTREMITY ROM: *pain  A/P ROM Right eval Left eval  Shoulder flexion  153/158*  Shoulder extension  60  Shoulder abduction  155/180*  Shoulder adduction    Shoulder internal rotation  55/62*  Shoulder external rotation  full  Elbow flexion    Elbow extension    Wrist flexion    Wrist extension    Wrist ulnar deviation    Wrist radial deviation    Wrist pronation    Wrist supination    (Blank rows = not tested)  UPPER EXTREMITY MMT:  MMT Right eval Left eval  Shoulder flexion  5  Shoulder extension  4+  Shoulder abduction  4  Shoulder adduction  Shoulder internal  rotation  5  Shoulder external rotation  4  Middle trapezius    Lower trapezius    (Blank rows = not tested)  SHOULDER SPECIAL TESTS: Impingement tests: Neer impingement test: negative and Hawkins/Kennedy impingement test: negative Instability tests: Apprehension test: negative Rotator cuff assessment: Drop arm test: negative, Empty can test: positive , Full can test: negative, Gerber lift off test: negative, and Belly press test: positive    JOINT MOBILITY TESTING:  No pain with joint mobility testing  PALPATION:  L supraspinatus near acromion, subscapularis and infraspinatus TTP   TODAY'S TREATMENT:                                                                                                                                         DATE:  05/26/23 Supine shoulder isometric: Flexion, ER, IR 5x5" each Supine shoulder AAROM with wand and graded manual resistance x 10  Seated UT/LS stretch 5x10" bil Seated scap retraction and depression STM attempted but increased pain 05/24/23 Manual: Skilled palpation and monitoring of soft tissues during DN. STM wear tolerated to areas needled.  Trigger Point Dry-Needling  Treatment instructions: Expect mild to moderate muscle soreness. S/S of pneumothorax if dry needled over a lung field, and to seek immediate medical attention should they occur. Patient verbalized understanding of these instructions and education. Patient Consent Given: Yes Education handout provided: Yes Muscles treated: L UT, levator, lats and Infraspinatus Electrical stimulation performed: No Parameters: N/A Treatment response/outcome: Palpable Increase in Muscle Length   Rock tape applied 3 I strips deltoids to UT (ant/med/post)  Seated L shoulder ER1x10; scapular retraction x 10 Seated shoulder isometrics ABD, IR, flex Bent over  scapular retraction x 25 Seated shrug x 10  05/19/23 Nustep L3x41min Seated L shoulder ER 2x10; IR 2x10 Seated rows RTB x 10  Seated  shld ext RTB x 10  Seated shoulder flexion with horizontal ABD x 10 YTB  05/09/23:  Therex:  instructed the patient in the following exercises, designed to improve the stability of L shoulder: also incorporated kinesiotaping as follows: 2 I- pieces, extending from ant delt to middle traps with 35% pull, and one lat delts to L upper traps with 35% pull Instructed in isometric strengthening for L shoulder flex, abd, ext, IR, ER , instructed in sitting due to instability in standing, fall risk 10 sec holds, 5 reps each  Prone for PA glides post ribs and cross friction massage L middle traps and L infraspinatus Attempted horizontal abd L shoulder in prone and pt unable Therefore inst in prone L scapular retraction , patient also inst in this ex in standing pendulum position Education in rationale for these specific exercises, to assist with motor relearning and stabilization of deep rotators.  05/03/23  Self care: use of rolled towel under humerus when lying on back to maintain joint position; postural ed; TE: reviewed current HEP from  MD and added scaption/ABD wall slides standing pain free range   PATIENT EDUCATION: Education details: PT eval findings, anticipated POC, HEP review, postural awareness, posture and body mechanics for typical daily postioning, mobility and household tasks, role of DN, and Ionto patch wearing instructions  Person educated: Patient Education method: Explanation, Demonstration, and Handouts Education comprehension: verbalized understanding and returned demonstration  HOME EXERCISE PROGRAM: To be established; reviewed MD issued program and added scaption/ABD wall slides. Access Code: [email or web address] URL: [email or web address] Date: 05/19/2023 Prepared by: Verta Ellen  Exercises - Standing Isometric Shoulder Abduction with Doorway - Arm Bent  - 1 x daily - 7 x weekly - 3 sets - 10 reps - Standing Isometric Shoulder Flexion with Doorway - Arm Bent  - 1 x  daily - 7 x weekly - 3 sets - 10 reps - Flexion-Extension Shoulder Pendulum with Table Support  - 1 x daily - 7 x weekly - 3 sets - 10 reps - Seated Shoulder External Rotation with Resistance  - 1 x daily - 3 x weekly - 2 sets - 10 reps - Seated Shoulder Internal Rotation with Anchored Resistance  - 1 x daily - 3 x weekly - 2 sets - 10 reps - Seated Shoulder Row with Anchored Resistance  - 1 x daily - 7 x weekly - 2 sets - 10 reps - Seated Shoulder Extension and Scapular Retraction with Resistance  - 1 x daily - 7 x weekly - 2 sets - 10 reps ASSESSMENT:  CLINICAL IMPRESSION: Pt notes improved pain today, improved pain from DN and taping. Progressed isometrics for increased joint stability. Attempted gentle STM but she was unable to tolerate. Pt also reports to have overdid HEP so we talked about proper sets and reps along with rest days to avoid overuse. She continues to demonstrate potential for improvement and would benefit from continued skilled therapy to address impairments.    OBJECTIVE IMPAIRMENTS: decreased activity tolerance, decreased ROM, decreased strength, increased muscle spasms, impaired flexibility, impaired UE functional use, postural dysfunction, and pain.   ACTIVITY LIMITATIONS: sleeping, bathing, dressing, and reaching outward horizontally  PARTICIPATION LIMITATIONS: cleaning  PERSONAL FACTORS: Fitness, Time since onset of injury/illness/exacerbation, and 1 comorbidity: DM  are also affecting patient's functional outcome.   REHAB POTENTIAL: Good  CLINICAL DECISION MAKING: Evolving/moderate complexity  EVALUATION COMPLEXITY: Low   GOALS: Goals reviewed with patient? Yes  SHORT TERM GOALS: Target date: 05/24/2023   Patient will be independent with initial HEP.  Baseline:  Goal status: MET- 05/26/23   LONG TERM GOALS: Target date: 06/28/2023   Patient will be independent with advanced/ongoing HEP to improve outcomes and carryover.  Baseline:  Goal status:  INITIAL  2.  Patient will report 75% improvement in L shoulder pain to improve QOL.  Baseline:  Goal status: INITIAL  3.  Patient to demonstrate improved upright posture with posterior shoulder girdle engaged to promote improved glenohumeral joint mobility. Baseline:  Goal status: INITIAL  4.  Patient to improve L shoulder AROM to [facility] without pain provocation to allow for increased ease of ADLs.  Baseline:  Goal status: INITIAL  5.  Patient will demonstrate improved  UE strength to 5/5 to normalize ADLS. Baseline:  Goal status: INITIAL  6  Patient will report 60 on Quick Dash to demonstrate improved functional ability.  Baseline: 70.5 / 100 = 70.5 % Goal status: INITIAL  7.  Patient will be able to sleep without props for shoulder pain.   Baseline:  Goal status: INITIAL  PLAN:  PT FREQUENCY: 2x/week  PT DURATION: 6 weeks  PLANNED INTERVENTIONS: Therapeutic exercises, Therapeutic activity, Neuromuscular re-education, Patient/Family education, Self Care, Joint mobilization, Dry Needling, Electrical stimulation, Spinal mobilization, Cryotherapy, Moist heat, Taping, Ultrasound, Ionotophoresis 4mg /ml Dexamethasone, and Manual therapy  PLAN FOR NEXT SESSION: Continue to gently progress shoulder strength as tolerated. Did she try the TENS unit?   Darleene Cleaver, PTA  05/26/2023, 11:26 AM

## 2023-05-28 NOTE — Pre-Procedure Instructions (Signed)
Pt is scheduled for procedure on Monday 06/05/23.  She takes Trulicity.  She states she will take her dose today and will not take again until after procedure.

## 2023-05-30 ENCOUNTER — Other Ambulatory Visit: Payer: Self-pay

## 2023-05-30 DIAGNOSIS — I493 Ventricular premature depolarization: Secondary | ICD-10-CM

## 2023-05-30 NOTE — Therapy (Addendum)
OUTPATIENT PHYSICAL THERAPY SHOULDER TREATMENT AND DISCHARGE SUMMARY   Patient Name: Anna Rowe MRN: [id number] DOB:[birth year], 47 y.o., female Today's Date: 05/31/2023  END OF SESSION:  PT End of Session - 05/31/23 0845     Visit Number 6    Date for PT Re-Evaluation 06/28/23    Authorization Type 12 visits 9/18-11/13/24    Authorization - Number of Visits 12    Progress Note Due on Visit 10    PT Start Time 0845    PT Stop Time 0929    PT Time Calculation (min) 44 min    Activity Tolerance Patient tolerated treatment well    Behavior During Therapy WFL for tasks assessed/performed                  Past Medical History:  Diagnosis Date   Arthritis    CHF (congestive heart failure) (HCC)    COPD (chronic obstructive pulmonary disease) (HCC)    Diabetes mellitus (HCC)    type 2    DVT (deep venous thrombosis) (HCC) dx 10-2016   RLE    HLD (hyperlipidemia)    on crestor   Pneumonia    09/2016   Sleep apnea    per patient ;been dx does not use CPAP does not tolerate   Past Surgical History:  Procedure Laterality Date   ACHILLES TENDON REPAIR     HERNIA REPAIR     x2; hiatal and umbilical    IVC FILTER INSERTION N/A 04/25/2017   Procedure: IVC Filter Insertion;  Surgeon: Nada Libman, MD;  Location: [facility];  Service: Cardiovascular;  Laterality: N/A;   IVC FILTER REMOVAL N/A 10/24/2017   Procedure: IVC FILTER REMOVAL;  Surgeon: Nada Libman, MD;  Location: [facility];  Service: Cardiovascular;  Laterality: N/A;   JOINT REPLACEMENT     right hip 07/14/17 Dr. Magnus Ivan   KNEE ARTHROSCOPY Right 2015   plantar     RIGHT/LEFT HEART CATH AND CORONARY ANGIOGRAPHY N/A 08/10/2018   Procedure: RIGHT/LEFT HEART CATH AND CORONARY ANGIOGRAPHY;  Surgeon: Dolores Patty, MD;  Location: [facility];  Service: Cardiovascular;  Laterality: N/A;   TOTAL HIP ARTHROPLASTY Left 04/28/2017   Procedure: LEFT TOTAL HIP  ARTHROPLASTY ANTERIOR APPROACH;  Surgeon: Kathryne Hitch, MD;  Location: [facility];  Service: Orthopedics;  Laterality: Left;   TOTAL HIP ARTHROPLASTY Right 07/14/2017   Procedure: RIGHT TOTAL HIP ARTHROPLASTY ANTERIOR APPROACH;  Surgeon: Kathryne Hitch, MD;  Location: [facility];  Service: Orthopedics;  Laterality: Right;   TOTAL KNEE ARTHROPLASTY Right 04/04/2016   Procedure: TOTAL KNEE ARTHROPLASTY;  Surgeon: Jodi Geralds, MD;  Location: [facility];  Service: Orthopedics;  Laterality: Right;   Patient Active Problem List   Diagnosis Date Noted   Bradycardia 01/17/2023   Chronic systolic (congestive) heart failure (HCC) 11/08/2018   CHF (congestive heart failure) (HCC) 08/09/2018   Acute diastolic CHF (congestive heart failure) (HCC) 08/08/2018   DM2 (diabetes mellitus, type 2) (HCC) 08/08/2018   History of DVT (deep vein thrombosis) 08/08/2018   Unilateral primary osteoarthritis, left knee 08/24/2017   History of total right knee replacement 08/24/2017   Chronic bilateral low back pain 08/24/2017   Status post total replacement of right hip 07/14/2017   Status post total replacement of left hip 04/28/2017   Unilateral primary osteoarthritis, left hip 03/09/2017   Unilateral primary osteoarthritis, right hip 03/09/2017   Primary osteoarthritis of right knee 04/04/2016    [facility]P: [facility]   REFERRING PROVIDER: Kirtland Bouchard, PA-C   REFERRING DIAG: O96.295 (ICD-10-CM) - Acute pain of left shoulder   THERAPY DIAG:  Acute pain of left shoulder  Stiffness of left shoulder, not elsewhere classified  Cramp and spasm  Muscle weakness (generalized)  Rationale for Evaluation and Treatment: Rehabilitation  ONSET DATE: 01/02/23  SUBJECTIVE:                                                                                                                                                                                      SUBJECTIVE STATEMENT:  Two steps forward 10  steps back. Having heart ablation Monday.   Eval: Patient fell on 01/02/23. Her R knee gave out in the shower and she hurt her left shoulder trying to catch herself. She also reports R UE numbness. Pain is worse with lying flat. Had a recent MRI for heart and the shoulder pain was very bad. Pushing down into cane also hurts, washing her back.  Hand dominance: Right  PERTINENT HISTORY: Spinal cord stimulator for LBP, DM, B THA, R TKR, CHF, DVT RLE, sleep apnea  PAIN:  Are you having pain? Yes: NPRS scale: 7/10 Pain location: L shoulder (indicates deltoid region) Pain description: sharp and stabbing Aggravating factors: lying on back, reaching behind back, horizontal ABD Relieving factors: rest  PRECAUTIONS: Fall  RED FLAGS: None   WEIGHT BEARING RESTRICTIONS: No  FALLS:  Has patient fallen in last 6 months? Yes. Number of falls 5 R  leg goes numb and gives out intermittently.  LIVING ENVIRONMENT: Lives with: lives with their spouse Lives in: House/apartment  OCCUPATION: Does not work; on disability  PLOF: Independent  PATIENT GOALS:get rid of pain  NEXT MD VISIT:   OBJECTIVE:   DIAGNOSTIC FINDINGS:  XR - negative  PATIENT SURVEYS:  Quick Dash 70.5 / 100 = 70.5 %  COGNITION: Overall cognitive status: Within functional limits for tasks assessed     SENSATION: WFL  POSTURE: Forward head, rounded shoulders, post pelvic tilt  UPPER EXTREMITY ROM: *pain  A/P ROM Right eval Left eval  Shoulder flexion  153/158*  Shoulder extension  60  Shoulder abduction  155/180*  Shoulder adduction    Shoulder internal rotation  55/62*  Shoulder external rotation  full  Elbow flexion    Elbow extension    Wrist flexion    Wrist extension    Wrist ulnar deviation    Wrist radial deviation    Wrist pronation    Wrist supination    (Blank rows = not tested)  UPPER EXTREMITY MMT:  MMT Right eval Left eval  Shoulder flexion  5  Shoulder  extension  4+  Shoulder  abduction  4  Shoulder adduction    Shoulder internal rotation  5  Shoulder external rotation  4  Middle trapezius    Lower trapezius    (Blank rows = not tested)  SHOULDER SPECIAL TESTS: Impingement tests: Neer impingement test: negative and Hawkins/Kennedy impingement test: negative Instability tests: Apprehension test: negative Rotator cuff assessment: Drop arm test: negative, Empty can test: positive , Full can test: negative, Gerber lift off test: negative, and Belly press test: positive    JOINT MOBILITY TESTING:  No pain with joint mobility testing  PALPATION:  L supraspinatus near acromion, subscapularis and infraspinatus TTP   TODAY'S TREATMENT:                                                                                                                                         DATE:   05/31/23 Manual: Rock tape applied 3 I strips deltoids to UT (ant/med/post)  Standing  B shoulder ER 1x10; scapular retraction x 10 Seated shoulder isometrics ABD, IR, ER, flex, ext Bent over  scapular retraction x 25 Attempted bent over wide row x 3 burning pain Standing extension x 20 Standing shrug x 20 Prone T and Y over swiss ball on plinth reps to tolerance  05/26/23 Supine shoulder isometric: Flexion, ER, IR 5x5" each Supine shoulder AAROM with wand and graded manual resistance x 10  Seated UT/LS stretch 5x10" bil Seated scap retraction and depression STM attempted but increased pain   05/24/23 Manual: Skilled palpation and monitoring of soft tissues during DN. STM wear tolerated to areas needled.  Trigger Point Dry-Needling  Treatment instructions: Expect mild to moderate muscle soreness. S/S of pneumothorax if dry needled over a lung field, and to seek immediate medical attention should they occur. Patient verbalized understanding of these instructions and education. Patient Consent Given: Yes Education handout provided: Yes Muscles treated: L UT, levator, lats and  Infraspinatus Electrical stimulation performed: No Parameters: N/A Treatment response/outcome: Palpable Increase in Muscle Length   Rock tape applied 3 I strips deltoids to UT (ant/med/post)  Seated  L shoulder ER1x10; scapular retraction x 10 Seated shoulder isometrics ABD, IR, flex Bent over  scapular retraction x 25 Seated shrug x 10  05/19/23 Nustep L3x43min Seated L shoulder ER 2x10; IR 2x10 Seated rows RTB x 10  Seated shld ext RTB x 10  Seated shoulder flexion with horizontal ABD x 10 YTB  05/09/23:  Therex:  instructed the patient in the following exercises, designed to improve the stability of L shoulder: also incorporated kinesiotaping as follows: 2 I- pieces, extending from ant delt to middle traps with 35% pull, and one lat delts to L upper traps with 35% pull Instructed in isometric strengthening for L shoulder flex, abd, ext, IR, ER , instructed in sitting due to instability in standing, fall risk 10 sec holds, 5 reps each  Prone  for PA glides post ribs and cross friction massage L middle traps and L infraspinatus Attempted horizontal abd L shoulder in prone and pt unable Therefore inst in prone L scapular retraction , patient also inst in this ex in standing pendulum position Education in rationale for these specific exercises, to assist with motor relearning and stabilization of deep rotators.  05/03/23  Self care: use of rolled towel under humerus when lying on back to maintain joint position; postural ed; TE: reviewed current HEP from MD and added scaption/ABD wall slides standing pain free range   PATIENT EDUCATION: Education details: PT eval findings, anticipated POC, HEP review, postural awareness, posture and body mechanics for typical daily postioning, mobility and household tasks, role of DN, and Ionto patch wearing instructions  Person educated: Patient Education method: Explanation, Demonstration, and Handouts Education comprehension: verbalized  understanding and returned demonstration  HOME EXERCISE PROGRAM: To be established; reviewed MD issued program and added scaption/ABD wall slides. Access Code: [email or web address] URL: [email or web address] Date: 05/19/2023 Prepared by: Verta Ellen  Exercises - Standing Isometric Shoulder Abduction with Doorway - Arm Bent  - 1 x daily - 7 x weekly - 3 sets - 10 reps - Standing Isometric Shoulder Flexion with Doorway - Arm Bent  - 1 x daily - 7 x weekly - 3 sets - 10 reps - Flexion-Extension Shoulder Pendulum with Table Support  - 1 x daily - 7 x weekly - 3 sets - 10 reps - Seated Shoulder External Rotation with Resistance  - 1 x daily - 3 x weekly - 2 sets - 10 reps - Seated Shoulder Internal Rotation with Anchored Resistance  - 1 x daily - 3 x weekly - 2 sets - 10 reps - Seated Shoulder Row with Anchored Resistance  - 1 x daily - 7 x weekly - 2 sets - 10 reps - Seated Shoulder Extension and Scapular Retraction with Resistance  - 1 x daily - 7 x weekly - 2 sets - 10 reps ASSESSMENT:  CLINICAL IMPRESSION: Patient reports no change in shoulder pain. She feels like she'll improve a little and then go backwards. She states that her shoulder pops out backwards and then she lies on her side raises her hand in the air and it pops into place.  Pain is now isolated to upper traps area and L rhomboids area. No longer complains of pain in the lats region. She tolerated therex fairly well today and was limited more by fatigue than pain. Rock tape was reapplied at end of session as pt reports some relief from it. She has a heart ablation scheduled for 06/05/23 and will not return until cleared by her cardiologist. Goals should be assessed at next visit.   OBJECTIVE IMPAIRMENTS: decreased activity tolerance, decreased ROM, decreased strength, increased muscle spasms, impaired flexibility, impaired UE functional use, postural dysfunction, and pain.   ACTIVITY LIMITATIONS: sleeping, bathing, dressing,  and reaching outward horizontally  PARTICIPATION LIMITATIONS: cleaning  PERSONAL FACTORS: Fitness, Time since onset of injury/illness/exacerbation, and 1 comorbidity: DM  are also affecting patient's functional outcome.   REHAB POTENTIAL: Good  CLINICAL DECISION MAKING: Evolving/moderate complexity  EVALUATION COMPLEXITY: Low   GOALS: Goals reviewed with patient? Yes  SHORT TERM GOALS: Target date: 05/24/2023   Patient will be independent with initial HEP.  Baseline:  Goal status: MET- 05/26/23   LONG TERM GOALS: Target date: 06/28/2023   Patient will be independent with advanced/ongoing HEP to improve outcomes and carryover.  Baseline:  Goal status: INITIAL  2.  Patient will report 75% improvement in L shoulder pain to improve QOL.  Baseline:  Goal status: INITIAL  3.  Patient to demonstrate improved upright posture with posterior shoulder girdle engaged to promote improved glenohumeral joint mobility. Baseline:  Goal status: INITIAL  4.  Patient to improve L shoulder AROM to [facility] without pain provocation to allow for increased ease of ADLs.  Baseline:  Goal status: INITIAL  5.  Patient will demonstrate improved  UE strength to 5/5 to normalize ADLS. Baseline:  Goal status: INITIAL  6  Patient will report 60 on Quick Dash to demonstrate improved functional ability.  Baseline: 70.5 / 100 = 70.5 % Goal status: INITIAL  7.  Patient will be able to sleep without props for shoulder pain.   Baseline:  Goal status: INITIAL      PLAN:  PT FREQUENCY: 2x/week  PT DURATION: 6 weeks  PLANNED INTERVENTIONS: Therapeutic exercises, Therapeutic activity, Neuromuscular re-education, Patient/Family education, Self Care, Joint mobilization, Dry Needling, Electrical stimulation, Spinal mobilization, Cryotherapy, Moist heat, Taping, Ultrasound, Ionotophoresis 4mg /ml Dexamethasone, and Manual therapy  PLAN FOR NEXT SESSION: See d/c summary   Solon Palm, PT  05/31/2023,  9:29 AM   PHYSICAL THERAPY DISCHARGE SUMMARY  Visits from Start of Care: 6  Current functional level related to goals / functional outcomes: See clinical impression above for last status   Remaining deficits: Unknown as patient did not return for f/u visits.   Education / Equipment: HEP/TBAND   Patient agrees to discharge. Patient goals were not met. Patient is being discharged due to not returning since the last visit.  Solon Palm, PT 06/28/23 11:35 AM [facility] [address] [place], [place] [address] [phone]  Fax: [phone]

## 2023-05-31 ENCOUNTER — Encounter: Payer: Self-pay | Admitting: Physical Therapy

## 2023-05-31 ENCOUNTER — Ambulatory Visit: Payer: Managed Care, Other (non HMO) | Admitting: Physical Therapy

## 2023-05-31 DIAGNOSIS — R252 Cramp and spasm: Secondary | ICD-10-CM

## 2023-05-31 DIAGNOSIS — M6281 Muscle weakness (generalized): Secondary | ICD-10-CM

## 2023-05-31 DIAGNOSIS — M25612 Stiffness of left shoulder, not elsewhere classified: Secondary | ICD-10-CM

## 2023-05-31 DIAGNOSIS — M25512 Pain in left shoulder: Secondary | ICD-10-CM

## 2023-05-31 LAB — BASIC METABOLIC PANEL
BUN/Creatinine Ratio: 7 — ABNORMAL LOW (ref 9–23)
BUN: 8 mg/dL (ref 6–24)
CO2: 18 mmol/L — ABNORMAL LOW (ref 20–29)
Calcium: 9.4 mg/dL (ref 8.7–10.2)
Chloride: 109 mmol/L — ABNORMAL HIGH (ref 96–106)
Creatinine, Ser: 1.14 mg/dL — ABNORMAL HIGH (ref 0.57–1.00)
Glucose: 111 mg/dL — ABNORMAL HIGH (ref 70–99)
Potassium: 5.2 mmol/L (ref 3.5–5.2)
Sodium: 140 mmol/L (ref 134–144)
eGFR: 60 mL/min/{1.73_m2} (ref >59)

## 2023-05-31 LAB — CBC
Hematocrit: 45.2 % (ref 34.0–46.6)
Hemoglobin: 13.3 g/dL (ref 11.1–15.9)
MCH: 22.6 pg — ABNORMAL LOW (ref 26.6–33.0)
MCHC: 29.4 g/dL — ABNORMAL LOW (ref 31.5–35.7)
MCV: 77 fL — ABNORMAL LOW (ref 79–97)
Platelets: 492 10*3/uL — ABNORMAL HIGH (ref 150–450)
RBC: 5.89 x10E6/uL — ABNORMAL HIGH (ref 3.77–5.28)
RDW: 16.8 % — ABNORMAL HIGH (ref 11.7–15.4)
WBC: 7.3 10*3/uL (ref 3.4–10.8)

## 2023-06-02 ENCOUNTER — Ambulatory Visit: Payer: Managed Care, Other (non HMO)

## 2023-06-02 NOTE — Pre-Procedure Instructions (Signed)
Instructed patient on the following items: Arrival time 1100 Nothing to eat or drink after midnight No meds AM of procedure Responsible person to drive you home and stay with you for 24 hrs  Last Trulicity was 10/13.

## 2023-06-05 ENCOUNTER — Ambulatory Visit (HOSPITAL_COMMUNITY)
Admission: RE | Disposition: A | Discharge status: Home / Self Care | Payer: PRIVATE HEALTH INSURANCE | Source: Home / Self Care | Attending: Cardiology

## 2023-06-05 ENCOUNTER — Ambulatory Visit (HOSPITAL_COMMUNITY)
Admission: RE | Admit: 2023-06-05 | Discharge: 2023-06-06 | Disposition: A | Discharge status: Home / Self Care | Payer: Managed Care, Other (non HMO) | Attending: Cardiology | Admitting: Cardiology

## 2023-06-05 ENCOUNTER — Other Ambulatory Visit: Payer: Self-pay

## 2023-06-05 ENCOUNTER — Ambulatory Visit (HOSPITAL_COMMUNITY): Payer: Managed Care, Other (non HMO) | Admitting: Anesthesiology

## 2023-06-05 DIAGNOSIS — G4733 Obstructive sleep apnea (adult) (pediatric): Secondary | ICD-10-CM | POA: Insufficient documentation

## 2023-06-05 DIAGNOSIS — I493 Ventricular premature depolarization: Secondary | ICD-10-CM | POA: Diagnosis present

## 2023-06-05 DIAGNOSIS — E039 Hypothyroidism, unspecified: Secondary | ICD-10-CM | POA: Insufficient documentation

## 2023-06-05 DIAGNOSIS — I255 Ischemic cardiomyopathy: Secondary | ICD-10-CM | POA: Diagnosis not present

## 2023-06-05 DIAGNOSIS — Z6835 Body mass index (BMI) 35.0-35.9, adult: Secondary | ICD-10-CM | POA: Diagnosis not present

## 2023-06-05 DIAGNOSIS — I5022 Chronic systolic (congestive) heart failure: Secondary | ICD-10-CM | POA: Diagnosis not present

## 2023-06-05 DIAGNOSIS — E119 Type 2 diabetes mellitus without complications: Secondary | ICD-10-CM | POA: Insufficient documentation

## 2023-06-05 DIAGNOSIS — Z87891 Personal history of nicotine dependence: Secondary | ICD-10-CM | POA: Insufficient documentation

## 2023-06-05 DIAGNOSIS — J449 Chronic obstructive pulmonary disease, unspecified: Secondary | ICD-10-CM | POA: Diagnosis not present

## 2023-06-05 DIAGNOSIS — I110 Hypertensive heart disease with heart failure: Secondary | ICD-10-CM | POA: Insufficient documentation

## 2023-06-05 HISTORY — PX: PVC ABLATION: EP1236

## 2023-06-05 LAB — POCT ACTIVATED CLOTTING TIME: Activated Clotting Time: 238 s

## 2023-06-05 LAB — PREGNANCY, URINE: Preg Test, Ur: NEGATIVE

## 2023-06-05 LAB — GLUCOSE, CAPILLARY
Glucose-Capillary: 117 mg/dL — ABNORMAL HIGH (ref 70–99)
Glucose-Capillary: 87 mg/dL (ref 70–99)

## 2023-06-05 SURGERY — PVC ABLATION
Anesthesia: Monitor Anesthesia Care

## 2023-06-05 MED ORDER — COLCHICINE 0.6 MG PO TABS
0.6000 mg | ORAL_TABLET | Freq: Every day | ORAL | Status: DC
  Administered 2023-06-05 – 2023-06-06 (×2): 0.6 mg via ORAL
  Filled 2023-06-05 (×3): qty 1

## 2023-06-05 MED ORDER — MIDAZOLAM HCL 2 MG/2ML IJ SOLN
INTRAMUSCULAR | Status: AC
  Filled 2023-06-05: qty 2

## 2023-06-05 MED ORDER — FENTANYL CITRATE (PF) 100 MCG/2ML IJ SOLN
INTRAMUSCULAR | Status: AC
  Filled 2023-06-05: qty 2

## 2023-06-05 MED ORDER — DAPAGLIFLOZIN PROPANEDIOL 10 MG PO TABS
10.0000 mg | ORAL_TABLET | Freq: Every day | ORAL | Status: DC
  Administered 2023-06-06: 10 mg via ORAL
  Filled 2023-06-05: qty 1

## 2023-06-05 MED ORDER — SACUBITRIL-VALSARTAN 97-103 MG PO TABS
1.0000 | ORAL_TABLET | Freq: Two times a day (BID) | ORAL | Status: DC
  Administered 2023-06-05 – 2023-06-06 (×2): 1 via ORAL
  Filled 2023-06-05 (×2): qty 1

## 2023-06-05 MED ORDER — GLYCOPYRROLATE 0.2 MG/ML IJ SOLN
INTRAMUSCULAR | Status: DC | PRN
  Administered 2023-06-05: .2 mg via INTRAVENOUS

## 2023-06-05 MED ORDER — CYCLOBENZAPRINE HCL 10 MG PO TABS
10.0000 mg | ORAL_TABLET | Freq: Three times a day (TID) | ORAL | Status: DC
  Administered 2023-06-05 – 2023-06-06 (×2): 10 mg via ORAL
  Filled 2023-06-05 (×2): qty 1

## 2023-06-05 MED ORDER — RIVAROXABAN 20 MG PO TABS: 20.0000 mg | ORAL_TABLET | Freq: Every day | ORAL | Status: DC

## 2023-06-05 MED ORDER — HEPARIN SODIUM (PORCINE) 1000 UNIT/ML IJ SOLN
INTRAMUSCULAR | Status: AC
  Filled 2023-06-05: qty 20

## 2023-06-05 MED ORDER — GABAPENTIN 300 MG PO CAPS
600.0000 mg | ORAL_CAPSULE | Freq: Four times a day (QID) | ORAL | Status: DC
  Administered 2023-06-05 – 2023-06-06 (×3): 600 mg via ORAL
  Filled 2023-06-05 (×3): qty 2

## 2023-06-05 MED ORDER — LABETALOL HCL 5 MG/ML IV SOLN
INTRAVENOUS | Status: DC | PRN
  Administered 2023-06-05: 10 mg via INTRAVENOUS

## 2023-06-05 MED ORDER — TAPENTADOL HCL 50 MG PO TABS
50.0000 mg | ORAL_TABLET | Freq: Two times a day (BID) | ORAL | Status: DC
  Administered 2023-06-05 – 2023-06-06 (×2): 50 mg via ORAL
  Filled 2023-06-05 (×2): qty 1

## 2023-06-05 MED ORDER — FENTANYL CITRATE (PF) 250 MCG/5ML IJ SOLN
INTRAMUSCULAR | Status: DC | PRN
  Administered 2023-06-05: 25 ug via INTRAVENOUS
  Administered 2023-06-05 (×3): 50 ug via INTRAVENOUS
  Administered 2023-06-05: 25 ug via INTRAVENOUS

## 2023-06-05 MED ORDER — HEPARIN (PORCINE) IN NACL 1000-0.9 UT/500ML-% IV SOLN
INTRAVENOUS | Status: DC | PRN
  Administered 2023-06-05: 500 mL

## 2023-06-05 MED ORDER — SODIUM CHLORIDE 0.9% FLUSH
3.0000 mL | Freq: Two times a day (BID) | INTRAVENOUS | Status: DC
  Administered 2023-06-06: 3 mL via INTRAVENOUS

## 2023-06-05 MED ORDER — DULAGLUTIDE 1.5 MG/0.5ML ~~LOC~~ SOAJ: 1.5000 mg | SUBCUTANEOUS | Status: DC

## 2023-06-05 MED ORDER — METFORMIN HCL ER 500 MG PO TB24
500.0000 mg | ORAL_TABLET | Freq: Two times a day (BID) | ORAL | Status: DC
  Administered 2023-06-05 – 2023-06-06 (×2): 500 mg via ORAL
  Filled 2023-06-05 (×3): qty 1

## 2023-06-05 MED ORDER — MEDROXYPROGESTERONE ACETATE 150 MG/ML IM SUSP: 150.0000 mg | INTRAMUSCULAR | Status: DC

## 2023-06-05 MED ORDER — MIDAZOLAM HCL 2 MG/2ML IJ SOLN
INTRAMUSCULAR | Status: DC | PRN
  Administered 2023-06-05: 2 mg via INTRAVENOUS

## 2023-06-05 MED ORDER — IPRATROPIUM-ALBUTEROL 0.5-2.5 (3) MG/3ML IN SOLN: 3.0000 mL | Freq: Four times a day (QID) | RESPIRATORY_TRACT | Status: DC | PRN

## 2023-06-05 MED ORDER — LEVOTHYROXINE SODIUM 50 MCG PO TABS
50.0000 ug | ORAL_TABLET | Freq: Every day | ORAL | Status: DC
  Administered 2023-06-05 – 2023-06-06 (×2): 50 ug via ORAL
  Filled 2023-06-05 (×2): qty 1

## 2023-06-05 MED ORDER — SODIUM CHLORIDE 0.9% FLUSH
3.0000 mL | INTRAVENOUS | Status: DC | PRN
  Administered 2023-06-05: 3 mL via INTRAVENOUS

## 2023-06-05 MED ORDER — DOCUSATE SODIUM 100 MG PO CAPS
100.0000 mg | ORAL_CAPSULE | Freq: Two times a day (BID) | ORAL | Status: DC
  Administered 2023-06-05 – 2023-06-06 (×2): 100 mg via ORAL
  Filled 2023-06-05 (×2): qty 1

## 2023-06-05 MED ORDER — HEPARIN SODIUM (PORCINE) 1000 UNIT/ML IJ SOLN
INTRAMUSCULAR | Status: DC | PRN
  Administered 2023-06-05: 8000 [IU] via INTRAVENOUS
  Administered 2023-06-05: 4000 [IU] via INTRAVENOUS
  Administered 2023-06-05: 8000 [IU] via INTRAVENOUS

## 2023-06-05 MED ORDER — COLCHICINE 0.6 MG PO TABS: 0.6000 mg | ORAL_TABLET | Freq: Once | ORAL | Status: DC

## 2023-06-05 MED ORDER — SPIRONOLACTONE 12.5 MG HALF TABLET
12.5000 mg | ORAL_TABLET | Freq: Every day | ORAL | Status: DC
  Administered 2023-06-05 – 2023-06-06 (×2): 12.5 mg via ORAL
  Filled 2023-06-05 (×2): qty 1

## 2023-06-05 MED ORDER — MOMETASONE FURO-FORMOTEROL FUM 200-5 MCG/ACT IN AERO
2.0000 | INHALATION_SPRAY | Freq: Two times a day (BID) | RESPIRATORY_TRACT | Status: DC
  Administered 2023-06-06: 2 via RESPIRATORY_TRACT
  Filled 2023-06-05: qty 8.8

## 2023-06-05 MED ORDER — ROSUVASTATIN CALCIUM 5 MG PO TABS
10.0000 mg | ORAL_TABLET | Freq: Every day | ORAL | Status: DC
  Administered 2023-06-05: 10 mg via ORAL
  Filled 2023-06-05: qty 2

## 2023-06-05 MED ORDER — ESMOLOL HCL 100 MG/10ML IV SOLN
INTRAVENOUS | Status: DC | PRN
  Administered 2023-06-05: 50 mg via INTRAVENOUS

## 2023-06-05 MED ORDER — FENTANYL CITRATE (PF) 100 MCG/2ML IJ SOLN
25.0000 ug | INTRAMUSCULAR | Status: DC | PRN
  Administered 2023-06-05: 50 ug via INTRAVENOUS

## 2023-06-05 MED ORDER — CARVEDILOL 12.5 MG PO TABS
12.5000 mg | ORAL_TABLET | Freq: Two times a day (BID) | ORAL | Status: DC
  Administered 2023-06-06: 12.5 mg via ORAL
  Filled 2023-06-05: qty 1

## 2023-06-05 MED ORDER — OXYCODONE-ACETAMINOPHEN 7.5-325 MG PO TABS
1.0000 | ORAL_TABLET | Freq: Three times a day (TID) | ORAL | Status: DC
  Administered 2023-06-05 – 2023-06-06 (×2): 1 via ORAL
  Filled 2023-06-05 (×3): qty 1

## 2023-06-05 MED ORDER — MEXILETINE HCL 200 MG PO CAPS
200.0000 mg | ORAL_CAPSULE | Freq: Two times a day (BID) | ORAL | Status: DC
  Administered 2023-06-05 – 2023-06-06 (×2): 200 mg via ORAL
  Filled 2023-06-05 (×2): qty 1

## 2023-06-05 MED ORDER — PROTAMINE SULFATE 10 MG/ML IV SOLN
INTRAVENOUS | Status: DC | PRN
  Administered 2023-06-05: 40 mg via INTRAVENOUS

## 2023-06-05 MED ORDER — BUPIVACAINE HCL (PF) 0.25 % IJ SOLN
INTRAMUSCULAR | Status: AC
  Filled 2023-06-05: qty 30

## 2023-06-05 MED ORDER — HEPARIN SODIUM (PORCINE) 1000 UNIT/ML IJ SOLN
INTRAMUSCULAR | Status: DC | PRN
  Administered 2023-06-05: 1000 [IU] via INTRAVENOUS

## 2023-06-05 MED ORDER — PROPOFOL 500 MG/50ML IV EMUL
INTRAVENOUS | Status: DC | PRN
  Administered 2023-06-05: 30 ug/kg/min via INTRAVENOUS

## 2023-06-05 MED ORDER — HEPARIN SODIUM (PORCINE) 1000 UNIT/ML IJ SOLN
INTRAMUSCULAR | Status: AC
  Filled 2023-06-05: qty 10

## 2023-06-05 MED ORDER — MELOXICAM 7.5 MG PO TABS
15.0000 mg | ORAL_TABLET | Freq: Every day | ORAL | Status: DC
  Administered 2023-06-05 – 2023-06-06 (×2): 15 mg via ORAL
  Filled 2023-06-05 (×2): qty 2

## 2023-06-05 MED ORDER — FOLIC ACID 1 MG PO TABS
1.0000 mg | ORAL_TABLET | Freq: Every day | ORAL | Status: DC
  Administered 2023-06-05 – 2023-06-06 (×2): 1 mg via ORAL
  Filled 2023-06-05 (×2): qty 1

## 2023-06-05 MED ORDER — SODIUM CHLORIDE 0.9 % IV SOLN: INTRAVENOUS | Status: DC

## 2023-06-05 MED ORDER — KETOROLAC TROMETHAMINE 15 MG/ML IJ SOLN
15.0000 mg | Freq: Once | INTRAMUSCULAR | Status: AC
Start: 2023-06-05 — End: 2023-06-05
  Administered 2023-06-05: 15 mg via INTRAVENOUS
  Filled 2023-06-05: qty 1

## 2023-06-05 MED ORDER — AMITRIPTYLINE HCL 25 MG PO TABS
50.0000 mg | ORAL_TABLET | Freq: Every day | ORAL | Status: DC
  Administered 2023-06-05: 50 mg via ORAL
  Filled 2023-06-05: qty 2

## 2023-06-05 MED ORDER — ONDANSETRON HCL 4 MG/2ML IJ SOLN
4.0000 mg | Freq: Four times a day (QID) | INTRAMUSCULAR | Status: DC | PRN
Start: 2023-06-05 — End: 2023-06-06

## 2023-06-05 MED ORDER — ACETAMINOPHEN 325 MG PO TABS
650.0000 mg | ORAL_TABLET | ORAL | Status: DC | PRN
Start: 2023-06-05 — End: 2023-06-06

## 2023-06-05 MED ORDER — LIDOCAINE 5 % EX PTCH: 1.0000 | MEDICATED_PATCH | Freq: Every day | CUTANEOUS | Status: DC | PRN

## 2023-06-05 MED ORDER — SODIUM CHLORIDE 0.9% FLUSH
10.0000 mL | Freq: Two times a day (BID) | INTRAVENOUS | Status: DC
  Administered 2023-06-06: 10 mL via INTRAVENOUS

## 2023-06-05 MED ORDER — POTASSIUM CHLORIDE CRYS ER 20 MEQ PO TBCR
20.0000 meq | EXTENDED_RELEASE_TABLET | Freq: Every day | ORAL | Status: DC
  Administered 2023-06-05 – 2023-06-06 (×2): 20 meq via ORAL
  Filled 2023-06-05 (×2): qty 1

## 2023-06-05 MED ORDER — ONDANSETRON HCL 4 MG/2ML IJ SOLN
INTRAMUSCULAR | Status: DC | PRN
  Administered 2023-06-05: 4 mg via INTRAVENOUS

## 2023-06-05 MED ORDER — VITAMIN D 25 MCG (1000 UNIT) PO TABS
1000.0000 [IU] | ORAL_TABLET | Freq: Every day | ORAL | Status: DC
  Administered 2023-06-05 – 2023-06-06 (×2): 1000 [IU] via ORAL
  Filled 2023-06-05 (×2): qty 1

## 2023-06-05 SURGICAL SUPPLY — 17 items
BAG SNAP BAND KOVER 36X36 (MISCELLANEOUS) ×1
CATH ABLAT QDOT MICRO BI TC DF (CATHETERS) ×1
CATH DECANAV D CURVE (CATHETERS) ×1
CATH JOSEPH QUAD ALLRED 6F REP (CATHETERS) ×1
CATH SOUNDSTAR ECO 8FR (CATHETERS) ×1
CLOSURE MYNX CONTROL 6F/7F (Vascular Products) ×4 IMPLANT
MAT PREVALON FULL STRYKER (MISCELLANEOUS) ×1
PACK EP LATEX FREE (CUSTOM PROCEDURE TRAY) ×1
PACK EP LF (CUSTOM PROCEDURE TRAY) ×1
PAD DEFIB RADIO PHYSIO CONN (PAD) ×1
PATCH CARTO3 (PAD) ×1
SHEATH PINNACLE 6F 10CM (SHEATH) ×1
SHEATH PINNACLE 7F 10CM (SHEATH)
SHEATH PINNACLE 8F 10CM (SHEATH) ×2
SHEATH PINNACLE 9F 10CM (SHEATH) ×1
SHEATH PROBE COVER 6X72 (BAG) ×1
TUBING SMART ABLATE COOLFLOW (TUBING) ×1

## 2023-06-05 NOTE — H&P (Signed)
  Electrophysiology Office Note:   Date:  06/05/2023  ID:  Anna Rowe, DOB 02-Feb-1976, MRN [id number]  Primary Cardiologist: None Electrophysiologist: Anna Arruda Jorja Loa, MD      History of Present Illness:   Anna Rowe is a 47 y.o. female with h/o heart failure, PVCs seen today for  for Electrophysiology evaluation of chronic systolic heart failure at the request of Anna Rowe.     Today she is doing well.  She does have palpitations that occur on a weekly basis.  She says sometimes, her palpitations are quite rapid.  She has had 2 episodes of syncope during the time of palpitations.  When she has her palpitations, she feels fatigued and mildly short of breath.  She has a history stated for morbid obesity, type 2 diabetes, COPD with ongoing tobacco abuse, OSA noncompliant with CPAP, DVTs.  She was admitted to the hospital in 2019 with acute heart failure and ejection fraction of 15 to 20%.  Cardiac monitor showed an elevated PVC burden and was started on amiodarone for PVC suppression.  She was switched to mexiletine.  Initially she had 10% PVCs, with mexiletine PVC burden dropped to 7.9%.      Today, denies symptoms of palpitations, chest pain, shortness of breath, orthopnea, PND, lower extremity edema, claudication, dizziness, presyncope, syncope, bleeding, or neurologic sequela. The patient is tolerating medications without difficulties. Plan pvc ablation today.   EP Information / Studies Reviewed:    EKG is ordered today. Personal review as below.        Risk Assessment/Calculations:             Physical Exam:   VS:  There were no vitals taken for this visit.   Wt Readings from Last 3 Encounters:  04/04/23 120.7 kg  01/17/23 121 kg  12/27/22 122 kg    GEN: No acute distress.   Neck: No JVD Cardiac: RRR, no murmurs, rubs, or gallops.  Respiratory: decreased BS bases bilaterally. GI: Soft, nontender, non-distended  MS: No edema;  No deformity. Neuro:  Nonfocal  Skin: warm and dry Psych: Normal affect    ASSESSMENT AND PLAN:    1.  Chronic Solik heart failure: Due to ischemic cardiomyopathy.  Currently on optimal medical therapy per primary cardiology.  Ejection fraction is reduced, though not well seen on most recent cardiac MRI.  She has not had an echo in a few years.  Anna Rowe repeat her echo as she has had episodes of syncope, concerning for ventricular arrhythmias.    2.  PVCs: Anna Rowe has presented today for surgery, with the diagnosis of PVC.  The various methods of treatment have been discussed with the patient and family. After consideration of risks, benefits and other options for treatment, the patient has consented to  Procedure(s): Catheter ablation as a surgical intervention .  Risks include but not limited to complete heart block, stroke, esophageal damage, nerve damage, bleeding, vascular damage, tamponade, perforation, MI, and death. The patient's history has been reviewed, patient examined, no change in status, stable for surgery.  I have reviewed the patient's chart and labs.  Questions were answered to the patient's satisfaction.    Anna Hilgeman Elberta Fortis, MD 06/05/2023 11:14 AM

## 2023-06-05 NOTE — Anesthesia Postprocedure Evaluation (Signed)
Anesthesia Post Note  Patient: Anna Rowe  Procedure(s) Performed: PVC ABLATION     Patient location during evaluation: PACU Anesthesia Type: MAC Level of consciousness: awake and alert and oriented Pain management: pain level controlled Vital Signs Assessment: post-procedure vital signs reviewed and stable Respiratory status: spontaneous breathing, nonlabored ventilation and respiratory function stable Cardiovascular status: stable and blood pressure returned to baseline Postop Assessment: no apparent nausea or vomiting Anesthetic complications: no   There were no known notable events for this encounter.  Last Vitals:  Vitals:   06/05/23 1116 06/05/23 1555  BP: (!) 132/94 123/75  Pulse: 67 79  Resp: 18 20  Temp: 36.9 C 36.6 C  SpO2: 99% 94%    Last Pain:  Vitals:   06/05/23 1555  TempSrc: Temporal  PainSc:    Pain Goal:                   [name].

## 2023-06-05 NOTE — Transfer of Care (Signed)
Immediate Anesthesia Transfer of Care Note  Patient: Anna Rowe  Procedure(s) Performed: PVC ABLATION  Patient Location: PACU  Anesthesia Type:MAC  Level of Consciousness: awake, alert , oriented, and patient cooperative  Airway & Oxygen Therapy: Patient Spontanous Breathing and Patient connected to nasal cannula oxygen  Post-op Assessment: Report given to RN and Post -op Vital signs reviewed and stable  Post vital signs: Reviewed and stable  Last Vitals:  Vitals Value Taken Time  BP    Temp    Pulse 79 06/05/23 1600  Resp 18 06/05/23 1600  SpO2 98 % 06/05/23 1600  Vitals shown include unfiled device data.  Last Pain:  Vitals:   06/05/23 1141  TempSrc:   PainSc: 5          Complications: There were no known notable events for this encounter.

## 2023-06-05 NOTE — Anesthesia Preprocedure Evaluation (Addendum)
Anesthesia Evaluation  Patient identified by MRN, date of birth, ID band Patient awake    Reviewed: Allergy & Precautions, NPO status , Patient's Chart, lab work & pertinent test results, reviewed documented beta blocker date and time   History of Anesthesia Complications Negative for: history of anesthetic complications  Airway Mallampati: II  TM Distance: >3 FB Neck ROM: Full    Dental  (+) Missing,    Pulmonary sleep apnea , COPD, former smoker   Pulmonary exam normal        Cardiovascular hypertension, Pt. on medications and Pt. on home beta blockers +CHF  Normal cardiovascular exam+ dysrhythmias (PVCs)      Neuro/Psych    GI/Hepatic negative GI ROS, Neg liver ROS,,,  Endo/Other  diabetes, Type 2, Oral Hypoglycemic AgentsHypothyroidism    Renal/GU Renal InsufficiencyRenal disease (Cr 1.14)     Musculoskeletal  (+) Arthritis ,    Abdominal   Peds  Hematology negative hematology ROS (+)   Anesthesia Other Findings Day of surgery medications reviewed with patient.  Reproductive/Obstetrics                              Anesthesia Physical Anesthesia Plan  ASA: 3  Anesthesia Plan: MAC   Post-op Pain Management: Minimal or no pain anticipated   Induction:   PONV Risk Score and Plan: 2 and Treatment may vary due to age or medical condition, Ondansetron, Midazolam and Propofol infusion  Airway Management Planned:   Additional Equipment: None  Intra-op Plan:   Post-operative Plan:   Informed Consent: I have reviewed the patients History and Physical, chart, labs and discussed the procedure including the risks, benefits and alternatives for the proposed anesthesia with the patient or authorized representative who has indicated his/her understanding and acceptance.       Plan Discussed with: CRNA  Anesthesia Plan Comments:         Anesthesia Quick Evaluation

## 2023-06-06 ENCOUNTER — Encounter (HOSPITAL_COMMUNITY): Payer: Self-pay | Admitting: Cardiology

## 2023-06-06 ENCOUNTER — Ambulatory Visit (INDEPENDENT_AMBULATORY_CARE_PROVIDER_SITE_OTHER): Payer: Managed Care, Other (non HMO)

## 2023-06-06 DIAGNOSIS — I110 Hypertensive heart disease with heart failure: Secondary | ICD-10-CM | POA: Diagnosis not present

## 2023-06-06 DIAGNOSIS — I493 Ventricular premature depolarization: Secondary | ICD-10-CM

## 2023-06-06 DIAGNOSIS — I5022 Chronic systolic (congestive) heart failure: Secondary | ICD-10-CM | POA: Diagnosis not present

## 2023-06-06 MED ORDER — COLCHICINE 0.6 MG PO TABS: 0.6000 mg | ORAL_TABLET | Freq: Every day | ORAL | 0 refills | Status: DC

## 2023-06-06 MED ORDER — MEXILETINE HCL 150 MG PO CAPS: 300.0000 mg | ORAL_CAPSULE | Freq: Two times a day (BID) | ORAL | 6 refills | Status: DC

## 2023-06-06 MED FILL — Bupivacaine HCl Preservative Free (PF) Inj 0.25%: INTRAMUSCULAR | Qty: 30 | Status: AC

## 2023-06-06 MED FILL — Fentanyl Citrate Preservative Free (PF) Inj 100 MCG/2ML: INTRAMUSCULAR | Qty: 4 | Status: AC

## 2023-06-06 NOTE — Discharge Summary (Addendum)
ELECTROPHYSIOLOGY PROCEDURE DISCHARGE SUMMARY    Patient ID: Anna Rowe,  MRN: [id number], DOB/AGE: [birth year] 47 y.o.  Admit date: 06/05/2023 Discharge date: 06/06/2023  Primary Care Physician: [facility]  Primary Cardiologist: None  Electrophysiologist: Dr. Elberta Fortis   Primary Discharge Diagnosis:  PVCs   Procedures This Admission:  i.  Electrophysiology study and radiofrequency catheter ablation of PVCs on 06/05/2023 by Dr. Elberta Fortis .  This study demonstrated; 1.  PVCs upon presentation.   2.  Ablation in the right ventricular outflow tract and left ventricle distal to the right left coronary commissure 3. No early apparent complications.      Brief HPI: Anna Rowe is a 47 y.o. female with a history of PVCs.  They have failed medical therapy with BB and mexitil. Risks, benefits, and alternatives to catheter ablation of PVCs were reviewed with the patient who wished to proceed.   Hospital Course:  The patient was admitted and underwent EPS/RFCA of PVCs with details as outlined above.  They were monitored on telemetry overnight which demonstrated continue PVCs, though ? Decreased in .  Groin was without complication on the day of discharge.  The patient was examined and considered to be stable for discharge.  Wound care and restrictions were reviewed with the patient.  The patient Anna Rowe be seen back by Dr. Elberta Fortis in 4 weeks.   Anna Rowe be increased, and we Anna Rowe have her wear a Zio patch for 1 week to quantify her PVCs.  Physical Exam: Vitals:   06/06/23 0341 06/06/23 0512 06/06/23 0541 06/06/23 0738  BP: (!) 112/96 (!) 102/55 (!) 97/51 96/74  Pulse: 92 78 80 85  Resp: 20 19 16 16   Temp:  98.5 F (36.9 C)  98.7 F (37.1 C)  TempSrc:  Oral  Oral  SpO2: 96% 98% 93% 99%  Weight:      Height:        GEN- NAD. A&O x 3.  HEENT: Normocephalic, atraumatic Lungs- CTAB, Normal effort.  Heart- RRR, No M/G/R.  GI-  Soft, NT, ND.  Extremities- No clubbing, cyanosis, or edema;  Skin- warm and dry, no rash or lesion, left chest without hematoma/ecchymosis  Discharge Medications:  Allergies as of 06/06/2023       Reactions   Methocarbamol Anaphylaxis, Swelling   Tramadol Anaphylaxis   Tolerates Dilaudid, Oxycontin 04/04/16   Bee Venom Hives        Medication List     TAKE these medications    amitriptyline 50 MG tablet Commonly known as: ELAVIL Take 1 tablet (50 mg total) by mouth at bedtime.   carvedilol 12.5 MG tablet Commonly known as: COREG TAKE 1 TABLET (12.5MG  TOTAL) BY MOUTH TWICE A DAY WITH MEALS   cholecalciferol 25 MCG (1000 UNIT) tablet Commonly known as: VITAMIN D3 Take 1,000 Units by mouth daily.   colchicine 0.6 MG tablet Take 1 tablet (0.6 mg total) by mouth daily. Start taking on: June 07, 2023   cyclobenzaprine 10 MG tablet Commonly known as: FLEXERIL Take 10 mg by mouth 3 (three) times daily.   dapagliflozin propanediol 10 MG Tabs tablet Commonly known as: Farxiga Take 1 tablet (10 mg total) by mouth daily before breakfast. Please call for office visit [phone]   docusate sodium 100 MG capsule Commonly known as: COLACE Take 100 mg by mouth 2 (two) times daily.   Entresto 97-103 MG Generic drug: sacubitril-valsartan TAKE 1 TABLET BY MOUTH TWICE A DAY   folic acid 1 MG tablet  Commonly known as: FOLVITE Take 1 mg by mouth daily.   gabapentin 600 MG tablet Commonly known as: NEURONTIN Take 600 mg by mouth 4 (four) times daily.   ipratropium-albuterol 0.5-2.5 (3) MG/3ML Soln Commonly known as: DUONEB Take 3 mLs by nebulization every 6 (six) hours as needed (shortness of breath).   Klor-Con M20 20 MEQ tablet Generic drug: potassium chloride SA TAKE 1 TABLET BY MOUTH EVERY DAY   levothyroxine 50 MCG tablet Commonly known as: SYNTHROID Take 50 mcg by mouth daily.   lidocaine 5 % Commonly known as: LIDODERM Place 1 patch onto the skin daily  as needed (pain).   medroxyPROGESTERone 150 MG/ML injection Commonly known as: DEPO-PROVERA Inject 150 mg into the muscle every 3 (three) months.   Meloxicam 15 MG Tbdp Take 15 mg by mouth daily.   metFORMIN 500 MG 24 hr tablet Commonly known as: GLUCOPHAGE-XR Take 500 mg 2 (two) times daily by mouth.   Anna 150 MG capsule Commonly known as: MEXITIL Take 2 capsules (300 mg total) by mouth 2 (two) times daily. What changed:  medication strength how much to take   mometasone-formoterol 200-5 MCG/ACT Aero Commonly known as: DULERA Inhale 2 puffs into the lungs 2 (two) times daily. What changed:  when to take this reasons to take this   naloxegol oxalate 25 MG Tabs tablet Commonly known as: MOVANTIK Take 25 mg by mouth daily.   oxyCODONE-acetaminophen 7.5-325 MG tablet Commonly known as: PERCOCET Take 1 tablet by mouth 3 (three) times daily.   rosuvastatin 10 MG tablet Commonly known as: CRESTOR Take 1 tablet (10 mg total) by mouth at bedtime.   spironolactone 25 MG tablet Commonly known as: ALDACTONE TAKE 0.5 TABLETS (12.5 MG TOTAL) BY MOUTH DAILY. NEEDS FOLLOW UP APPOINTMENT FOR ANYMORE REFILLS   tapentadol 50 MG tablet Commonly known as: NUCYNTA Take 50 mg by mouth 2 (two) times daily.   Trulicity 1.5 MG/0.5ML Soaj Generic drug: Dulaglutide Inject 1.5 mg into the skin every Sunday.   Xarelto 20 MG Tabs tablet Generic drug: rivaroxaban TAKE 1 TABLET BY MOUTH EVERY DAY        Disposition: Home with usual follow up as in AVS  Duration of Discharge Encounter: Greater than 30 minutes including physician time.  Anna Flock, PA-C  06/06/2023 8:24 AM   I have seen and examined this patient with Anna Rowe.  Agree with above, note added to reflect my findings.  Patient admitted to the hospital after PVC ablation.  Patient is continue to have PVCs, but burden appears to be reduced.  Ablation was performed in both the RVOT and the LV  distal to the right left coronary commissure.  She is feeling well and ready to return home.  Anna Rowe increase Anna to 300 mg daily and have her wear a monitor for 7 days.  GEN: Well nourished, well developed, in no acute distress  HEENT: normal  Neck: no JVD, carotid bruits, or masses Cardiac: RRR; no murmurs, rubs, or gallops,no edema  Respiratory:  clear to auscultation bilaterally, normal work of breathing GI: soft, nontender, nondistended, + BS MS: no deformity or atrophy  Skin: warm and dry Neuro:  Strength and sensation are intact Psych: euthymic mood, full affect    Anna Rowe M. Bunnie Rehberg MD 06/06/2023 8:58 AM

## 2023-06-06 NOTE — Plan of Care (Signed)

## 2023-06-06 NOTE — Progress Notes (Signed)
Discharge instructions (including medications) discussed with and copy provided to patient/caregiver 

## 2023-06-06 NOTE — Progress Notes (Unsigned)
Enrolled patient for a 7 day Zio XT monitor to be mailed to patients home  [place] to read

## 2023-06-09 DIAGNOSIS — I493 Ventricular premature depolarization: Secondary | ICD-10-CM | POA: Diagnosis not present

## 2023-06-15 ENCOUNTER — Other Ambulatory Visit: Payer: Self-pay | Admitting: *Deleted

## 2023-06-15 DIAGNOSIS — I493 Ventricular premature depolarization: Secondary | ICD-10-CM

## 2023-06-15 DIAGNOSIS — I5022 Chronic systolic (congestive) heart failure: Secondary | ICD-10-CM

## 2023-07-19 ENCOUNTER — Ambulatory Visit (HOSPITAL_COMMUNITY): Payer: Managed Care, Other (non HMO) | Attending: Internal Medicine

## 2023-07-19 DIAGNOSIS — I5022 Chronic systolic (congestive) heart failure: Secondary | ICD-10-CM | POA: Diagnosis not present

## 2023-07-19 DIAGNOSIS — I493 Ventricular premature depolarization: Secondary | ICD-10-CM | POA: Diagnosis not present

## 2023-07-19 LAB — ECHOCARDIOGRAM COMPLETE
Calc EF: 47.6 %
S' Lateral: 4.49 cm
Single Plane A2C EF: 48.5 %
Single Plane A4C EF: 46.9 %

## 2023-07-24 ENCOUNTER — Ambulatory Visit: Payer: Medicare PPO | Attending: Cardiology | Admitting: Cardiology

## 2023-07-24 ENCOUNTER — Encounter: Payer: Self-pay | Admitting: Cardiology

## 2023-07-24 VITALS — BP 122/76 | HR 94 | Ht 72.0 in | Wt 278.0 lb

## 2023-07-24 DIAGNOSIS — I5022 Chronic systolic (congestive) heart failure: Secondary | ICD-10-CM | POA: Diagnosis not present

## 2023-07-24 DIAGNOSIS — I493 Ventricular premature depolarization: Secondary | ICD-10-CM | POA: Diagnosis not present

## 2023-07-24 NOTE — Progress Notes (Signed)
  Electrophysiology Office Note:   Date:  07/24/2023  ID:  [name], DOB 1976/08/08, MRN [id number]  Primary Cardiologist: None Electrophysiologist: Jamall Strohmeier Jorja Loa, MD      History of Present Illness:   Anna Rowe is a 47 y.o. female with h/o CHF, PVCs seen today for routine electrophysiology followup.   She is post ablation for PVCs 06/05/2023.  She had transient suppression with ablation in both the RV and LV, but despite this continued to have PVCs.  She was started on mexiletine.  During the procedure, she had multiple PVC morphologies.  She does have a history of systolic heart failure, and has had an improvement in her ejection fraction to mildly reduced since her ablation.  Repeat cardiac monitor showed a 6.1% PVC burden.  Since last being seen in our clinic the patient reports doing overall well.  She has no chest pain or shortness of breath.  She is able to do all of her daily activities without restriction.  she denies chest pain, palpitations, dyspnea, PND, orthopnea, nausea, vomiting, dizziness, syncope, edema, weight gain, or early satiety.   Review of systems complete and found to be negative unless listed in HPI.   EP Information / Studies Reviewed:    EKG is ordered today. Personal review as below.  EKG Interpretation Date/Time:  Monday July 24 2023 14:21:04 EST Ventricular Rate:  94 PR Interval:  146 QRS Duration:  110 QT Interval:  364 QTC Calculation: 455 R Axis:   -27  Text Interpretation: Sinus rhythm with frequent Premature ventricular complexes Moderate voltage criteria for LVH, may be normal variant ( R in aVL , Cornell product ) Septal infarct , age undetermined When compared with ECG of 06-Jun-2023 05:15, Premature ventricular complexes are now Present Septal infarct is now Present Confirmed by [employer], Kaloni Bisaillon ([id number]) on 07/24/2023 2:29:23 PM     Risk Assessment/Calculations:              Physical Exam:   VS:   BP 122/76   Pulse 94   Ht 6' (1.829 m)   Wt 278 lb (126.1 kg)   SpO2 95%   BMI 37.70 kg/m    Wt Readings from Last 3 Encounters:  07/24/23 278 lb (126.1 kg)  06/05/23 265 lb (120.2 kg)  04/04/23 266 lb (120.7 kg)     GEN: Well nourished, well developed in no acute distress NECK: No JVD; No carotid bruits CARDIAC: Regular rate and rhythm, no murmurs, rubs, gallops RESPIRATORY:  Clear to auscultation without rales, wheezing or rhonchi  ABDOMEN: Soft, non-tender, non-distended EXTREMITIES:  No edema; No deformity   ASSESSMENT AND PLAN:    1.  PVCs: Burden initially at 20%.  She had ablation 06/05/2023 with multiple PVCs.  She is now on mexiletine.  Repeat monitor shows a 6% burden.  He feels well without further episodes of syncope.  Anna Rowe continue with current management.  2.  Chronic systolic heart failure: Due to nonischemic cardiomyopathy.  Currently on optimal therapy per primary cardiology.  3.  Obstructive sleep apnea: CPAP compliance encouraged  4.  DVTs: Continue Xarelto  5.  Tobacco abuse: Cessation encouraged  6.  Obesity: Lifestyle modification encouraged  Follow up with Dr. Elberta Fortis in 6 months  Signed, Aamirah Salmi Jorja Loa, MD

## 2023-10-10 ENCOUNTER — Telehealth: Payer: Self-pay | Admitting: Cardiology

## 2023-10-10 NOTE — Telephone Encounter (Signed)
 Pt c/o Syncope: STAT if syncope occurred within 30 minutes and pt complains of lightheadedness High Priority if episode of passing out, completely, today or in last 24 hours   Did you pass out today? No  When is the last time you passed out?  Last time was lastnight     Has this occurred multiple times? Last night was the first time since [facility] Ablation 05/2023  Did you have any symptoms prior to passing out? Heart racing episodes x4. She felt like her ears were stopped up and could hear her heart beat and nothing else around her. States she did have lightheadedness    Appointment made for 2/27 with Veleta Miners, NP.

## 2023-10-10 NOTE — Telephone Encounter (Addendum)
 Discussed w/ pt Advised that if occurs again (fast heart rates) and she is symptomatic to go to ED for evaluation until she can be seen in 2 days for further examination (appt made by scheduler).  Explained it may be time for a monitor to determine what rhythm might be causing these symptoms. Patient verbalized understanding and agreeable to plan.

## 2023-10-11 NOTE — Progress Notes (Unsigned)
 Electrophysiology Office Note:   Date:  10/12/2023  ID:  Anna Rowe, Anna Rowe 12/07/1975, MRN [id number]  Primary Cardiologist: None Primary Heart Failure: None Electrophysiologist: Will Jorja Loa, MD      History of Present Illness:   Anna Rowe is a 48 y.o. female with h/o PVC's seen today for acute visit due to syncopal episodes.    She underwent PVC ablation in 05/2023 and was discharged on increased dose mexiletine.  PVC's appeared to be reduced.  She wore a 7 day monitor post discharge which showed 6% PVC's post ablation.    She notes she had prior syncopal episodes before her ablation but none since. She has been compliant with all medications, doses not smoke/drink.  BP has been stable at home with systolic pressures of 100-120's.  No missed OAC doses.  Patient reports she was out shopping on Monday 10/09/23 when she felt her heart racing. It resolved spontaneously. She went to a second store and it happened again. She leaned over the cart and it went away.  She went home and felt it again, she laid down and it resolved.  She then went into the kitchen to get lunch, felt her heart racing and sat down at the table, then woke up on the floor.  She reports she felt as if her ears were "stopped up and she could only hear her heartbeat" and lightheadedness. She realized the day after that she had hit her head.  She denies headaches, visual disturbances, speech difficulty after.    No family hx of autoimmune diseases.  Is currently dealing with hand issues > 4th/5th fingers on left hand are contracted.   She denies chest pain, palpitations, dyspnea, PND, orthopnea, nausea, vomiting, dizziness, syncope, edema, weight gain, or early satiety.   Review of systems complete and found to be negative unless listed in HPI.   EP Information / Studies Reviewed:    EKG is ordered today. Personal review as below.  EKG Interpretation Date/Time:  Thursday October 12 2023 08:47:47 EST Ventricular Rate:  91 PR Interval:  168 QRS Duration:  102 QT Interval:  370 QTC Calculation: 455 R Axis:   -23  Text Interpretation: Normal sinus rhythm Confirmed by Canary Brim ([id number]) on 10/12/2023 8:58:56 AM   Studies:  EPS 06/05/23 > PVC's upon presentation, multiple PVC morphologies, ablation of the RV outflow tract and LV distal to the right left coronary commissure Cardiac Monitor 06/2023 > 6% PVC's post ablation ECHO 07/2023 > frequent PVC's, LVEF 45-505%, indeterminate diastolic parameters    Arrhythmia / AAD PVC's Failed BB and Mexiletine        Physical Exam:   VS:  BP 108/60 (BP Location: Left Arm, Patient Position: Sitting, Cuff Size: Large)   Pulse 91   Ht 6' (1.829 m)   Wt 274 lb (124.3 kg)   SpO2 98%   BMI 37.16 kg/m    Wt Readings from Last 3 Encounters:  10/12/23 274 lb (124.3 kg)  07/24/23 278 lb (126.1 kg)  06/05/23 265 lb (120.2 kg)     GEN: Well nourished, well developed in no acute distress HEENT: small linear superficial scratch on left forehead  NECK: No JVD; No carotid bruits CARDIAC: Regular rate and rhythm, no murmurs, rubs, gallops RESPIRATORY:  Clear to auscultation without rales, wheezing or rhonchi  ABDOMEN: Soft, non-tender, non-distended EXTREMITIES:  No edema; No deformity   ASSESSMENT AND PLAN:    Syncopal Episodes  -BP stable, no issues at home  -  no recent changes in medications, compliant  -no ETOH / illicit drugs -compliant with Xarelto, less likely PE -xarelto compliance / PE?  -update labs > CBC, BMP, Mg+ -cautioned driving given episodes  -follow up in one month post monitor -ER precautions  PVC's  Initially 20% burden, s/p RVOT, LV distal to the right left coronary commissure, multiple morphologies on EPS. Repeat monitor with 6% burden -ZIO for syncopal episode to assess for PVC burden, VT -instructed patient to have someone drive her for now   HFrEF due to NICM  -GDMT per Cardiology   OSA   -CPAP compliance encouraged   DVT's  Secondary Hypercoagulable State  -continue Xarelto   Tobacco Abuse  -smoking cessation counseling    Follow up with EP APP in 4 weeks  Signed, Canary Brim, NP-C, AGACNP-BC [facility] - Electrophysiology  10/12/2023, 9:27 AM

## 2023-10-12 ENCOUNTER — Ambulatory Visit: Payer: Medicare PPO | Attending: Pulmonary Disease | Admitting: Pulmonary Disease

## 2023-10-12 ENCOUNTER — Ambulatory Visit (INDEPENDENT_AMBULATORY_CARE_PROVIDER_SITE_OTHER): Payer: Managed Care, Other (non HMO)

## 2023-10-12 ENCOUNTER — Encounter: Payer: Self-pay | Admitting: Pulmonary Disease

## 2023-10-12 VITALS — BP 108/60 | HR 91 | Ht 72.0 in | Wt 274.0 lb

## 2023-10-12 DIAGNOSIS — I493 Ventricular premature depolarization: Secondary | ICD-10-CM

## 2023-10-12 DIAGNOSIS — R55 Syncope and collapse: Secondary | ICD-10-CM | POA: Diagnosis not present

## 2023-10-12 DIAGNOSIS — I5022 Chronic systolic (congestive) heart failure: Secondary | ICD-10-CM | POA: Diagnosis not present

## 2023-10-12 LAB — CBC

## 2023-10-12 NOTE — Progress Notes (Unsigned)
 ZIO serial # [id number] from office inventory applied to patient.  Dr. Elberta Fortis to read.

## 2023-10-12 NOTE — Patient Instructions (Signed)
 Medication Instructions:  Your physician recommends that you continue on your current medications as directed. Please refer to the Current Medication list given to you today.  *If you need a refill on your cardiac medications before your next appointment, please call your pharmacy*  Lab Work: CBC, BMP, MAG-TODAY If you have labs (blood work) drawn today and your tests are completely normal, you will receive your results only by: MyChart Message (if you have MyChart) OR A paper copy in the mail If you have any lab test that is abnormal or we need to change your treatment, we will call you to review the results.   Testing/Procedures: Christena Deem- Long Term Monitor Instructions  Your physician has requested you wear a ZIO patch monitor for 14 days.  This is a single patch monitor. [employer] supplies one patch monitor per enrollment. Additional stickers are not available. Please do not apply patch if you will be having a Nuclear Stress Test,  Echocardiogram, Cardiac CT, MRI, or Chest Xray during the period you would be wearing the  monitor. The patch cannot be worn during these tests. You cannot remove and re-apply the  ZIO XT patch monitor.  Your ZIO patch monitor will be mailed 3 day USPS to your address on file. It may take 3-5 days  to receive your monitor after you have been enrolled.  Once you have received your monitor, please review the enclosed instructions. Your monitor  has already been registered assigning a specific monitor serial # to you.  Billing and Patient Assistance Program Information  We have supplied [employer] with any of your insurance information on file for billing purposes. [employer] offers a sliding scale Patient Assistance Program for patients that do not have  insurance, or whose insurance does not completely cover the cost of the ZIO monitor.  You must apply for the Patient Assistance Program to qualify for this discounted rate.  To apply, please call [employer] at  [phone], select option 4, select option 2, ask to apply for  Patient Assistance Program. Meredeth Ide will ask your household income, and how many people  are in your household. They will quote your out-of-pocket cost based on that information.  [employer] will also be able to set up a 34-month, interest-free payment plan if needed.  Applying the monitor   Shave hair from upper left chest.  Hold abrader disc by orange tab. Rub abrader in 40 strokes over the upper left chest as  indicated in your monitor instructions.  Clean area with 4 enclosed alcohol pads. Let dry.  Apply patch as indicated in monitor instructions. Patch will be placed under collarbone on left  side of chest with arrow pointing upward.  Rub patch adhesive wings for 2 minutes. Remove white label marked "1". Remove the white  label marked "2". Rub patch adhesive wings for 2 additional minutes.  While looking in a mirror, press and release button in center of patch. A small green light will  flash 3-4 times. This will be your only indicator that the monitor has been turned on.  Do not shower for the first 24 hours. You may shower after the first 24 hours.  Press the button if you feel a symptom. You will hear a small click. Record Date, Time and  Symptom in the Patient Logbook.  When you are ready to remove the patch, follow instructions on the last 2 pages of Patient  Logbook. Stick patch monitor onto the last page of Patient Logbook.  Place Patient Logbook  in the blue and white box. Use locking tab on box and tape box closed  securely. The blue and white box has prepaid postage on it. Please place it in the mailbox as  soon as possible. Your physician should have your test results approximately 7 days after the  monitor has been mailed back to [facility].  Call [facility] Customer Care at [phone] if you have questions regarding  your ZIO XT patch monitor. Call them immediately if you see an orange light  blinking on your  monitor.  If your monitor falls off in less than 4 days, contact our Monitor department at [phone].  If your monitor becomes loose or falls off after 4 days call [employer] at [phone] for  suggestions on securing your monitor    Follow-Up: At [facility], you and your health needs are our priority.  As part of our continuing mission to provide you with exceptional heart care, we have created designated Provider Care Teams.  These Care Teams include your primary Cardiologist (physician) and Advanced Practice Providers (APPs -  Physician Assistants and Nurse Practitioners) who all work together to provide you with the care you need, when you need it.  Your next appointment:   1 month(s)  Provider:   Canary Brim, NP

## 2023-10-13 ENCOUNTER — Telehealth: Payer: Self-pay | Admitting: *Deleted

## 2023-10-13 LAB — BASIC METABOLIC PANEL
BUN/Creatinine Ratio: 8 — ABNORMAL LOW (ref 9–23)
BUN: 11 mg/dL (ref 6–24)
CO2: 17 mmol/L — ABNORMAL LOW (ref 20–29)
Calcium: 9.4 mg/dL (ref 8.7–10.2)
Chloride: 107 mmol/L — ABNORMAL HIGH (ref 96–106)
Creatinine, Ser: 1.32 mg/dL — ABNORMAL HIGH (ref 0.57–1.00)
Glucose: 145 mg/dL — ABNORMAL HIGH (ref 70–99)
Potassium: 5 mmol/L (ref 3.5–5.2)
Sodium: 140 mmol/L (ref 134–144)
eGFR: 50 mL/min/{1.73_m2} — ABNORMAL LOW (ref >59)

## 2023-10-13 LAB — CBC
Hematocrit: 44.8 % (ref 34.0–46.6)
Hemoglobin: 13.5 g/dL (ref 11.1–15.9)
MCH: 22.5 pg — ABNORMAL LOW (ref 26.6–33.0)
MCHC: 30.1 g/dL — ABNORMAL LOW (ref 31.5–35.7)
MCV: 75 fL — ABNORMAL LOW (ref 79–97)
Platelets: 400 10*3/uL (ref 150–450)
RBC: 6.01 x10E6/uL — ABNORMAL HIGH (ref 3.77–5.28)
RDW: 17.7 % — ABNORMAL HIGH (ref 11.7–15.4)
WBC: 6.7 10*3/uL (ref 3.4–10.8)

## 2023-10-13 LAB — MAGNESIUM: Magnesium: 1.8 mg/dL (ref 1.6–2.3)

## 2023-10-13 NOTE — Telephone Encounter (Signed)
 New Message:      Please call, patient says she do not think she is going to be able to wear the monitor the entire time.

## 2023-10-16 NOTE — Telephone Encounter (Signed)
 Patient still wearing monitor.  She taped it in place and does not see any orange warning lights blinking.  She has worn the monitor more than 4 days at this point.  Instructed to continue to wear for the remainder of the 14 day period.  If it comes off , or if she needs to remove it due to irriation, just mail it back to [facility] for processing.  These types of monitor are billed by the amount of days of data they have recorded, (up to 7 days, or 8-14 days).

## 2023-10-28 ENCOUNTER — Other Ambulatory Visit (HOSPITAL_COMMUNITY): Payer: Self-pay | Admitting: Internal Medicine

## 2023-11-09 NOTE — Progress Notes (Unsigned)
  Electrophysiology Office Note:   Date:  11/09/2023  ID:  Anna Rowe, DOB Dec 28, 1975, MRN [id number]  Primary Cardiologist: None Primary Heart Failure: None Electrophysiologist: Will Jorja Loa, MD  {Click to update primary MD,subspecialty MD or APP then REFRESH:1}    History of Present Illness:   Anna Rowe is a 48 y.o. female with h/o PVC's s/p ablation (05/2023) seen today for routine electrophysiology followup.   Seen in EP Clinic 10/12/23 for syncopal episode.  Cardiac monitor completed after that visit which showed no sustained arrhythmias.  Symptom trigger episodes corresponded to SR, no AF, rare supraventricular ectopy, PVC's 5.8%.   Since last being seen in our clinic the patient reports doing ***.  she denies chest pain, palpitations, dyspnea, PND, orthopnea, nausea, vomiting, dizziness, syncope, edema, weight gain, or early satiety.   Review of systems complete and found to be negative unless listed in HPI.   EP Information / Studies Reviewed:    EKG is not ordered today. EKG from 10/12/23 reviewed which showed NSR 91 bpm      Studies:  EPS 06/05/23 > PVC's upon presentation, multiple PVC morphologies, ablation of the RV outflow tract and LV distal to the right left coronary commissure Cardiac Monitor 06/2023 > 6% PVC's post ablation ECHO 07/2023 > frequent PVC's, LVEF 45-505%, indeterminate diastolic parameters  Cardiac Monitro 11/07/23 > HR 62-141, ave 91 bpm, no AF detected, rare supraventricular ectopy, frequent ventricular ectopy 5.8%, no sustained arrhythmias, symptom trigger corresponds to SR.    Arrhythmia / AAD PVC's Failed BB and Mexiletine   Risk Assessment/Calculations:   {Does this patient have ATRIAL FIBRILLATION?[phone]} No BP recorded.  {Refresh Note OR Click here to enter BP  :1}***        Physical Exam:   VS:  There were no vitals taken for this visit.   Wt Readings from Last 3 Encounters:  10/12/23 274 lb  (124.3 kg)  07/24/23 278 lb (126.1 kg)  06/05/23 265 lb (120.2 kg)     GEN: Well nourished, well developed in no acute distress NECK: No JVD; No carotid bruits CARDIAC: {EPRHYTHM:28826}, no murmurs, rubs, gallops RESPIRATORY:  Clear to auscultation without rales, wheezing or rhonchi  ABDOMEN: Soft, non-tender, non-distended EXTREMITIES:  No edema; No deformity   ASSESSMENT AND PLAN:    Syncopal Episode  -cardiac monitor as above  -compliant with medications  -labs within normal limits  -encourage adequate hydration, eating before taking meds, compression stockings ***  -no clear cause, query vasovagal episode   PVC's  Initially 20% burden, s/p RVOT, LV distal to the right left coronary commissure, multiple morphologies on EPS. Repeat monitor with 6% burden > most recent with 5.8% -no ventricular arrhythmias   HFrEF due to NICM  -GDMT per Cardiology   DVT's  Secondary Hypercoagulable State -continue Xarelto   OSA  -CPAP compliance encouraged   Tobacco Abuse  -cessation counseling Follow up with {WUJWJ:19147} {EPFOLLOW WG:95621}  Signed, Canary Brim, NP-C, AGACNP-BC [facility] - Electrophysiology  11/09/2023, 7:08 PM

## 2023-11-10 ENCOUNTER — Ambulatory Visit: Payer: Medicare PPO | Attending: Internal Medicine | Admitting: Pulmonary Disease

## 2023-11-10 ENCOUNTER — Encounter: Payer: Self-pay | Admitting: Pulmonary Disease

## 2023-11-10 VITALS — BP 116/80 | HR 90 | Ht 72.0 in | Wt 271.0 lb

## 2023-11-10 DIAGNOSIS — I493 Ventricular premature depolarization: Secondary | ICD-10-CM | POA: Diagnosis not present

## 2023-11-10 DIAGNOSIS — R001 Bradycardia, unspecified: Secondary | ICD-10-CM | POA: Diagnosis not present

## 2023-11-10 DIAGNOSIS — I5022 Chronic systolic (congestive) heart failure: Secondary | ICD-10-CM | POA: Diagnosis not present

## 2023-11-10 DIAGNOSIS — D6869 Other thrombophilia: Secondary | ICD-10-CM

## 2023-11-10 DIAGNOSIS — R55 Syncope and collapse: Secondary | ICD-10-CM | POA: Diagnosis not present

## 2023-11-10 DIAGNOSIS — G4733 Obstructive sleep apnea (adult) (pediatric): Secondary | ICD-10-CM

## 2023-11-10 NOTE — Patient Instructions (Signed)
 Medication Instructions:   Your physician recommends that you continue on your current medications as directed. Please refer to the Current Medication list given to you today.   *If you need a refill on your cardiac medications before your next appointment, please call your pharmacy*  Lab Work: NONE ORDERED  TODAY    If you have labs (blood work) drawn today and your tests are completely normal, you will receive your results only by: MyChart Message (if you have MyChart) OR A paper copy in the mail If you have any lab test that is abnormal or we need to change your treatment, we will call you to review the results.  Testing/Procedures: NONE ORDERED  TODAY     Follow-Up: At [facility], you and your health needs are our priority.  As part of our continuing mission to provide you with exceptional heart care, our providers are all part of one team.  This team includes your primary Cardiologist (physician) and Advanced Practice Providers or APPs (Physician Assistants and Nurse Practitioners) who all work together to provide you with the care you need, when you need it.  Your next appointment:   3 month(s)  ( CONTACT  CASSIE HALL/ ANGELINE HAMMER FOR EP SCHEDULING ISSUES )   Provider:   Canary Brim, NP    We recommend signing up for the patient portal called "MyChart".  Sign up information is provided on this After Visit Summary.  MyChart is used to connect with patients for Virtual Visits (Telemedicine).  Patients are able to view lab/test results, encounter notes, upcoming appointments, etc.  Non-urgent messages can be sent to your provider as well.   To learn more about what you can do with MyChart, go to [email or web address].   Other Instructions       1st Floor: - Lobby - Registration  - Pharmacy  - Lab - Cafe  2nd Floor: - PV Lab - Diagnostic Testing (echo, CT, nuclear med)  3rd Floor: - Vacant  4th Floor: - TCTS (cardiothoracic surgery) - AFib  Clinic - Structural Heart Clinic - Vascular Surgery  - Vascular Ultrasound  5th Floor: - [facility] Cardiology (general and EP) - Clinical Pharmacy for coumadin, hypertension, lipid, weight-loss medications, and med management appointments    Valet parking services will be available as well.

## 2023-11-17 ENCOUNTER — Ambulatory Visit (HOSPITAL_COMMUNITY)
Admission: RE | Admit: 2023-11-17 | Discharge: 2023-11-17 | Disposition: A | Discharge status: Home / Self Care | Source: Ambulatory Visit | Attending: Internal Medicine | Admitting: Internal Medicine

## 2023-11-17 ENCOUNTER — Encounter (HOSPITAL_COMMUNITY): Payer: Self-pay | Admitting: Internal Medicine

## 2023-11-17 VITALS — BP 100/60 | HR 81 | Wt 268.2 lb

## 2023-11-17 DIAGNOSIS — Z7901 Long term (current) use of anticoagulants: Secondary | ICD-10-CM | POA: Diagnosis not present

## 2023-11-17 DIAGNOSIS — I5022 Chronic systolic (congestive) heart failure: Secondary | ICD-10-CM

## 2023-11-17 DIAGNOSIS — I493 Ventricular premature depolarization: Secondary | ICD-10-CM

## 2023-11-17 DIAGNOSIS — Z86718 Personal history of other venous thrombosis and embolism: Secondary | ICD-10-CM | POA: Insufficient documentation

## 2023-11-17 DIAGNOSIS — Z79899 Other long term (current) drug therapy: Secondary | ICD-10-CM | POA: Insufficient documentation

## 2023-11-17 DIAGNOSIS — J449 Chronic obstructive pulmonary disease, unspecified: Secondary | ICD-10-CM | POA: Diagnosis not present

## 2023-11-17 DIAGNOSIS — Z7984 Long term (current) use of oral hypoglycemic drugs: Secondary | ICD-10-CM | POA: Insufficient documentation

## 2023-11-17 DIAGNOSIS — Z87891 Personal history of nicotine dependence: Secondary | ICD-10-CM | POA: Insufficient documentation

## 2023-11-17 DIAGNOSIS — G4733 Obstructive sleep apnea (adult) (pediatric): Secondary | ICD-10-CM | POA: Diagnosis not present

## 2023-11-17 DIAGNOSIS — Z91199 Patient's noncompliance with other medical treatment and regimen due to unspecified reason: Secondary | ICD-10-CM | POA: Insufficient documentation

## 2023-11-17 DIAGNOSIS — R55 Syncope and collapse: Secondary | ICD-10-CM | POA: Insufficient documentation

## 2023-11-17 DIAGNOSIS — I428 Other cardiomyopathies: Secondary | ICD-10-CM | POA: Insufficient documentation

## 2023-11-17 DIAGNOSIS — Z7985 Long-term (current) use of injectable non-insulin antidiabetic drugs: Secondary | ICD-10-CM | POA: Insufficient documentation

## 2023-11-17 DIAGNOSIS — R531 Weakness: Secondary | ICD-10-CM | POA: Insufficient documentation

## 2023-11-17 DIAGNOSIS — E119 Type 2 diabetes mellitus without complications: Secondary | ICD-10-CM | POA: Insufficient documentation

## 2023-11-17 DIAGNOSIS — I2510 Atherosclerotic heart disease of native coronary artery without angina pectoris: Secondary | ICD-10-CM | POA: Insufficient documentation

## 2023-11-17 MED ORDER — MEXILETINE HCL 150 MG PO CAPS: 300.0000 mg | ORAL_CAPSULE | Freq: Two times a day (BID) | ORAL | 11 refills | Status: AC

## 2023-11-17 NOTE — Patient Instructions (Signed)
 Great to see you today!!!  Your physician recommends that you schedule a follow-up appointment in: 1 year with an echocardiogram (April 2026)

## 2023-11-17 NOTE — Progress Notes (Signed)
 Advanced Heart Failure Clinic Note  Date:  11/17/2023   ID:  Anna Rowe, DOB 14-Nov-1975, MRN [id number]  Location: Home  Provider location: [facility] Type of Visit: Established patient  PCP:  [facility]  Cardiologist:  None Primary HF: Araiya Tilmon  Chief Complaint: Heart Failure follow-up   History of Present Illness:  Anna Rowe is a 48 y.o. female with a history of morbid obesity, DM2, COPD with ongoing tobacco use, OSA noncompliant with CPAP, chronic DVT (diagnosed 2018) on Xarelto, severe OA s/p R TKR and R/L hip replacements.    Her dad had HF and received an LVAD, after a similar "viral cardiomyopathy" type picture   Admitted 12/19 with acute HF. Echo EF 15-20%. [facility] 12/27 showed marked volume overload and low output and minimal nonobstructive CAD. Diuresed on milrinone, and weaned off as tolerated as medications titrated.    Monitor 4/20 -> started on amio for PVC suppression  1. Predominant rhythm is sinus. Patient had a min HR of 71 bpm, max HR of 174 bpm, and avg HR of 98 bpm. 2. 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 Fifty episodes of brief NSVT up to 6 beats 4. Several patient-triggered events associated with either PVCs or normal sinus rhythm   Echo 5/20 EF 25-30%  Echo 9/20 EF EF 40-45% (I thought closer to ~50%)  Echo 10/21 45-50%   In 8/22 we switched amio to mexilitene to avoid long-term exposure to amio. F/u Zio 12/22 7.9% PVCs  cMRI 8/24: Unable to calculate EF due to gating. Moderate global dysfunction. Non-specific midwall LGE   s/p PVC ablation in 10/24 with Dr. Elberta Fortis  Echo 12/24 45-50%   Seen in EP Clinic 10/12/23 for syncopal episode. Cardiac monitor completed after that visit which showed no sustained arrhythmias. Symptom trigger episodes corresponded to SR, no AF, rare supraventricular ectopy,  PVC's 5.8%.   Here for routine f/u. Says she feels ok. Denies CP or SOB. Main complaint is weakness/inability to move R 4/5th finger.   Cardiac studies:   Cath 12/27 Findings: Ao = 99/75 (81) LV = 93/31 RA = 22 RV = 54/25 PA = 58/24 (41) PCW = 30 Fick cardiac output/index = 4.1/1.6 SVR = 1163 PVR = 2.6 FA sat = 89% PA sat = 42%, 44% Assessment: 1. 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 Severe NICM with LVEF ~ 15% 3. Marked volume overload with low output   Past Medical History:  Diagnosis Date   Arthritis    CHF (congestive heart failure) (HCC)    COPD (chronic obstructive pulmonary disease) (HCC)    Diabetes mellitus (HCC)    type 2    DVT (deep venous thrombosis) (HCC) dx 10-2016   RLE    HLD (hyperlipidemia)    on crestor   Pneumonia    09/2016   Sleep apnea    per patient ;been dx does not use CPAP does not tolerate   Past Surgical History:  Procedure Laterality Date   ACHILLES TENDON REPAIR     HERNIA REPAIR     x2; hiatal and umbilical    IVC FILTER INSERTION N/A 04/25/2017   Procedure: IVC Filter Insertion;  Surgeon: Nada Libman, MD;  Location: [facility] INVASIVE CV LAB;  Service: Cardiovascular;  Laterality: N/A;   IVC FILTER REMOVAL N/A 10/24/2017   Procedure: IVC FILTER REMOVAL;  Surgeon: Nada Libman, MD;  Location:  [facility] CV [facility];  Service: Cardiovascular;  Laterality: N/A;   JOINT REPLACEMENT     right hip 07/14/17 Dr. Magnus Ivan   KNEE ARTHROSCOPY Right 2015   plantar     PVC ABLATION N/A 06/05/2023   Procedure: PVC ABLATION;  Surgeon: Regan Lemming, MD;  Location: [facility] INV[facility] CV LAB;  Service: Cardiovascular;  Laterality: N/A;   RIGHT/LEFT HEART CATH AND CORONARY ANGIOGRAPHY N/A 08/10/2018   Procedure: RIGHT/LEFT HEART CATH AND CORONARY ANGIOGRAPHY;  Surgeon: Dolores Patty, MD;  Location: [facility] CV [facility];  Service: Cardiovascular;  Laterality: N/A;   TOTAL HIP ARTHROPLASTY Left 04/28/2017   Procedure: LEFT TOTAL HIP  ARTHROPLASTY ANTERIOR APPROACH;  Surgeon: Kathryne Hitch, MD;  Location: [facility] ORS;  Service: Orthopedics;  Laterality: Left;   TOTAL HIP ARTHROPLASTY Right 07/14/2017   Procedure: RIGHT TOTAL HIP ARTHROPLASTY ANTERIOR APPROACH;  Surgeon: Kathryne Hitch, MD;  Location: [facility] ORS;  Service: Orthopedics;  Laterality: Right;   TOTAL KNEE ARTHROPLASTY Right 04/04/2016   Procedure: TOTAL KNEE ARTHROPLASTY;  Surgeon: Jodi Geralds, MD;  Location: [facility] OR;  Service: Orthopedics;  Laterality: Right;     Current Outpatient Medications  Medication Sig Dispense Refill   amitriptyline (ELAVIL) 50 MG tablet Take 1 tablet (50 mg total) by mouth at bedtime. 30 tablet 2   carvedilol (COREG) 12.5 MG tablet TAKE 1 TABLET (12.5MG  TOTAL) BY MOUTH TWICE A DAY WITH MEALS 180 tablet 3   cholecalciferol (VITAMIN D3) 25 MCG (1000 UNIT) tablet Take 1,000 Units by mouth daily.     cyclobenzaprine (FLEXERIL) 10 MG tablet Take 10 mg by mouth 3 (three) times daily.  1   dapagliflozin propanediol (FARXIGA) 10 MG TABS tablet Take 1 tablet (10 mg total) by mouth daily before breakfast. Please call for office visit [phone] 30 tablet 0   docusate sodium (COLACE) 100 MG capsule Take 100 mg by mouth 2 (two) times daily.   6   Dulaglutide (TRULICITY) 1.5 MG/0.5ML SOPN Inject 1.5 mg into the skin every Sunday.     folic acid (FOLVITE) 1 MG tablet Take 1 mg by mouth daily.     gabapentin (NEURONTIN) 600 MG tablet Take 600 mg by mouth 4 (four) times daily.   3   ipratropium-albuterol (DUONEB) 0.5-2.5 (3) MG/3ML SOLN Take 3 mLs by nebulization every 6 (six) hours as needed (shortness of breath).     KLOR-CON M20 20 MEQ tablet TAKE 1 TABLET BY MOUTH EVERY DAY 90 tablet 2   levothyroxine (SYNTHROID) 50 MCG tablet Take 50 mcg by mouth daily.     lidocaine (LIDODERM) 5 % Place 1 patch onto the skin daily as needed (pain).     medroxyPROGESTERone (DEPO-PROVERA) 150 MG/ML injection Inject 150 mg into the muscle every 3 (three)  months.     Meloxicam 15 MG TBDP Take 15 mg by mouth daily.     metFORMIN (GLUCOPHAGE-XR) 500 MG 24 hr tablet Take 500 mg 2 (two) times daily by mouth.   3   mexiletine (MEXITIL) 150 MG capsule Take 2 capsules (300 mg total) by mouth 2 (two) times daily. 120 capsule 6   mometasone-formoterol (DULERA) 200-5 MCG/ACT AERO Inhale 2 puffs into the lungs 2 (two) times daily. 2 Inhaler 0   naloxegol oxalate (MOVANTIK) 25 MG TABS tablet Take 25 mg by mouth daily.     oxyCODONE-acetaminophen (PERCOCET) 7.5-325 MG tablet Take 1 tablet by mouth 3 (three) times daily.     rosuvastatin (CRESTOR) 10 MG tablet Take 1 tablet (  10 mg total) by mouth at bedtime. 30 tablet 5   sacubitril-valsartan (ENTRESTO) 97-103 MG TAKE 1 TABLET BY MOUTH TWICE A DAY 60 tablet 11   spironolactone (ALDACTONE) 25 MG tablet TAKE 0.5 TABLETS (12.5 MG TOTAL) BY MOUTH DAILY. NEEDS FOLLOW UP APPOINTMENT FOR ANYMORE REFILLS 45 tablet 0   tapentadol (NUCYNTA) 50 MG tablet Take 50 mg by mouth 2 (two) times daily.     XARELTO 20 MG TABS tablet TAKE 1 TABLET BY MOUTH EVERY DAY 30 tablet 11   No current facility-administered medications for this encounter.    Allergies:   Methocarbamol, Tramadol, and Bee venom   Social History:  The patient  reports that she quit smoking about 5 years ago. Her smoking use included cigarettes. She started smoking about 22 years ago. She has a 8.5 pack-year smoking history. She has never used smokeless tobacco. She reports that she does not drink alcohol and does not use drugs.   Family History:  The patient's family history includes Hypertension in her father, mother, and sister.   ROS:  Please see the history of present illness.   All other systems are personally reviewed and negative.   Vitals:   11/17/23 0848  BP: 100/60  Pulse: 81  SpO2: 97%  Weight: 121.7 kg (268 lb 3.2 oz)     Exam:   General:  Well appearing. No resp difficulty HEENT: normal Neck: supple. no JVD. Carotids 2+ bilat; no  bruits. No lymphadenopathy or thryomegaly appreciated. Cor: PMI nondisplaced. Regular rate & rhythm. No rubs, gallops or murmurs. Lungs: clear Abdomen: obese soft, nontender, nondistended. No hepatosplenomegaly. No bruits or masses. Good bowel sounds. Extremities: no cyanosis, clubbing, rash, edema Neuro: alert & orientedx3, cranial nerves grossly intact. moves all 4 extremities w/o difficulty. Affect pleasant   ECG: Sinus 100 +lvh 1 pvc Personally reviewed    Recent Labs: 10/12/2023: BUN 11; Creatinine, Ser 1.32; Hemoglobin 13.5; Magnesium 1.8; Platelets 400; Potassium 5.0; Sodium 140  Personally reviewed   Wt Readings from Last 3 Encounters:  11/17/23 121.7 kg (268 lb 3.2 oz)  11/10/23 122.9 kg (271 lb)  10/12/23 124.3 kg (274 lb)      ASSESSMENT AND PLAN:  1. Chronic systolic HF due to NICM.  Suspect possible PVC related vs viral or OSA  - Echo 12/25: EF 15-20%, severe diffuse HK and septal dyskinesis, grade 2 DD, mod MR, LA moderately dilated, RV moderately dilated with moderately reduced function, PA peak pressure 35 mm Hg.  - Echo 5/20 EF 25-30% (improving with PVC suppression) - hold off on ICD eval for now - [facility] 12/19 showed marked volume overload with low output, severe NICM with EF 15%, and minimal non-obstructive CAD. She has had a hysterectomy .  - Echo 9/20 40-45% (I thought 50%)- markedly improved with suppression of PVCs - Echo 10/21 45-50%  - cMRI 8/24: Unable to calculate EF due to gating. Moderate global dysfunction. Non-specific midwall LGE  - Echo 12/24 45-50%  - Stable NYHA I-II. Volume ok  - Continue carvedilol 12.5 bid  - Continue Entresto 97/103 mg  twice a day.    - Continue spiro 12.5 mg daily. - Continue Farxiga 10  - overall stable. BP too low to titrate GDMT.  - f/u 1 year with echo  2. Minimal nonobstructive CAD:  - LHC 12/27: 30% Prox LAD - No s/s angina - Continue crestor.  - No ASA with Xarelto use   3. DVT - Continue 20mg  daily. Can  consider reducing  to 10mg  for long-term prophylaxis per EINSTEIN-EXTENSION trial   4. OSA:  - Continue CPAP   5. Syncopal episode 2/25 - has seen EP. Monitor ok - no recurrence   6. NSVT/PVCs  - Zio Patch placed having 10% PVC burden. Having at least 2 different morphologies.  - Started amio 200 mg twice a day on 12/05/2018.  - Zio 11/21 3.1% PVCs; 185 runs of NSVT. With near normal EF not ICD candidate - Amio switched to mexilitene in 8/22 - Zio 12/22 7.9% PVCs - s/p PVC ablation in 10/24 with Dr. Elberta Fortis - Zio 2/25  PVC's 5.8%.   7. DM2 - continue Farxiga & Trulicity - Per PCP  [name], MD  11/17/2023 9:11 AM  [facility] [address] [facility] [place] [address] [phone] (office) [phone] (fax)

## 2023-12-21 ENCOUNTER — Other Ambulatory Visit (HOSPITAL_COMMUNITY): Payer: Self-pay | Admitting: Internal Medicine

## 2024-01-24 ENCOUNTER — Other Ambulatory Visit (HOSPITAL_COMMUNITY): Payer: Self-pay | Admitting: Internal Medicine

## 2024-02-19 ENCOUNTER — Other Ambulatory Visit (HOSPITAL_COMMUNITY): Payer: Self-pay | Admitting: Internal Medicine

## 2024-03-21 ENCOUNTER — Other Ambulatory Visit (HOSPITAL_COMMUNITY): Payer: Self-pay

## 2024-05-08 ENCOUNTER — Telehealth (HOSPITAL_COMMUNITY): Payer: Self-pay | Admitting: Cardiology

## 2024-05-08 NOTE — Telephone Encounter (Signed)
 Patient called to report she was seen by endocrinology today and advised to call with reading of 97/65 HR 60   Pt reports she does not check b/p regularly   Pt reports no sob,cp, dizziness  Reports she did take medications today (only am doses, pm dose due 6pm)  Please advise

## 2024-05-09 NOTE — Telephone Encounter (Signed)
 Pt aware and voiced understanding
